# Patient Record
Sex: Male | Born: 1970
Health system: Southern US, Community
[De-identification: ages and names within clinical notes are randomized; demographics above are authoritative.]

## PROBLEM LIST (undated history)

## (undated) DIAGNOSIS — Z6841 Body Mass Index (BMI) 40.0 and over, adult: Secondary | ICD-10-CM

## (undated) DIAGNOSIS — R51 Headache: Secondary | ICD-10-CM

## (undated) DIAGNOSIS — K7682 Hepatic encephalopathy: Secondary | ICD-10-CM

## (undated) DIAGNOSIS — N200 Calculus of kidney: Secondary | ICD-10-CM

## (undated) DIAGNOSIS — G43909 Migraine, unspecified, not intractable, without status migrainosus: Secondary | ICD-10-CM

## (undated) DIAGNOSIS — I878 Other specified disorders of veins: Secondary | ICD-10-CM

## (undated) DIAGNOSIS — K7290 Hepatic failure, unspecified without coma: Secondary | ICD-10-CM

## (undated) DIAGNOSIS — R519 Headache, unspecified: Secondary | ICD-10-CM

## (undated) DIAGNOSIS — K219 Gastro-esophageal reflux disease without esophagitis: Secondary | ICD-10-CM

## (undated) DIAGNOSIS — G4733 Obstructive sleep apnea (adult) (pediatric): Secondary | ICD-10-CM

## (undated) DIAGNOSIS — Z9989 Dependence on other enabling machines and devices: Secondary | ICD-10-CM

## (undated) DIAGNOSIS — R0600 Dyspnea, unspecified: Secondary | ICD-10-CM

## (undated) DIAGNOSIS — R0683 Snoring: Secondary | ICD-10-CM

## (undated) DIAGNOSIS — I10 Essential (primary) hypertension: Secondary | ICD-10-CM

## (undated) DIAGNOSIS — E7800 Pure hypercholesterolemia, unspecified: Secondary | ICD-10-CM

## (undated) DIAGNOSIS — L0390 Cellulitis, unspecified: Secondary | ICD-10-CM

## (undated) DIAGNOSIS — R7303 Prediabetes: Secondary | ICD-10-CM

## (undated) DIAGNOSIS — K7460 Unspecified cirrhosis of liver: Secondary | ICD-10-CM

## (undated) HISTORY — DX: Other specified disorders of veins: I87.8

## (undated) HISTORY — PX: VASECTOMY: SHX75

## (undated) HISTORY — DX: Pure hypercholesterolemia, unspecified: E78.00

## (undated) HISTORY — DX: Snoring: R06.83

## (undated) HISTORY — DX: Body Mass Index (BMI) 40.0 and over, adult: Z684

## (undated) HISTORY — DX: Calculus of kidney: N20.0

## (undated) HISTORY — DX: Morbid (severe) obesity due to excess calories: E66.01

## (undated) HISTORY — DX: Cellulitis, unspecified: L03.90

---
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
## 2008-06-16 ENCOUNTER — Emergency Department (HOSPITAL_COMMUNITY): Admission: EM | Admit: 2008-06-16 | Discharge: 2008-06-16 | Payer: Self-pay | Admitting: Emergency Medicine

## 2011-08-30 LAB — CBC
HCT: 46.5
Hemoglobin: 16.1
MCHC: 34.6
RDW: 13.8

## 2011-08-30 LAB — BASIC METABOLIC PANEL
CO2: 24
Calcium: 9.2
Glucose, Bld: 108 — ABNORMAL HIGH
Potassium: 5.3 — ABNORMAL HIGH
Sodium: 138

## 2011-08-30 LAB — DIFFERENTIAL
Basophils Absolute: 0
Basophils Relative: 0
Eosinophils Relative: 2
Monocytes Absolute: 0.8

## 2011-08-30 LAB — POCT CARDIAC MARKERS
CKMB, poc: <1 — ABNORMAL LOW
Troponin i, poc: <0.05

## 2015-01-02 DIAGNOSIS — N200 Calculus of kidney: Secondary | ICD-10-CM

## 2015-01-02 HISTORY — DX: Calculus of kidney: N20.0

## 2015-01-09 ENCOUNTER — Observation Stay (HOSPITAL_COMMUNITY)
Admission: EM | Admit: 2015-01-09 | Discharge: 2015-01-11 | Disposition: A | Discharge status: Home / Self Care | Payer: BLUE CROSS/BLUE SHIELD | Attending: Internal Medicine | Admitting: Internal Medicine

## 2015-01-09 ENCOUNTER — Inpatient Hospital Stay (HOSPITAL_COMMUNITY): Payer: BLUE CROSS/BLUE SHIELD

## 2015-01-09 ENCOUNTER — Emergency Department (HOSPITAL_COMMUNITY): Payer: BLUE CROSS/BLUE SHIELD

## 2015-01-09 ENCOUNTER — Encounter (HOSPITAL_COMMUNITY): Payer: Self-pay | Admitting: Vascular Surgery

## 2015-01-09 ENCOUNTER — Emergency Department: Payer: Self-pay | Admitting: Emergency Medicine

## 2015-01-09 DIAGNOSIS — R7303 Prediabetes: Secondary | ICD-10-CM

## 2015-01-09 DIAGNOSIS — R4701 Aphasia: Secondary | ICD-10-CM

## 2015-01-09 DIAGNOSIS — G459 Transient cerebral ischemic attack, unspecified: Secondary | ICD-10-CM | POA: Diagnosis not present

## 2015-01-09 DIAGNOSIS — I10 Essential (primary) hypertension: Secondary | ICD-10-CM

## 2015-01-09 DIAGNOSIS — G450 Vertebro-basilar artery syndrome: Secondary | ICD-10-CM

## 2015-01-09 DIAGNOSIS — I639 Cerebral infarction, unspecified: Secondary | ICD-10-CM | POA: Insufficient documentation

## 2015-01-09 DIAGNOSIS — Z79899 Other long term (current) drug therapy: Secondary | ICD-10-CM | POA: Insufficient documentation

## 2015-01-09 DIAGNOSIS — R7309 Other abnormal glucose: Secondary | ICD-10-CM

## 2015-01-09 DIAGNOSIS — D696 Thrombocytopenia, unspecified: Secondary | ICD-10-CM | POA: Diagnosis not present

## 2015-01-09 DIAGNOSIS — E785 Hyperlipidemia, unspecified: Secondary | ICD-10-CM | POA: Diagnosis not present

## 2015-01-09 DIAGNOSIS — G9340 Encephalopathy, unspecified: Secondary | ICD-10-CM

## 2015-01-09 DIAGNOSIS — K219 Gastro-esophageal reflux disease without esophagitis: Secondary | ICD-10-CM | POA: Diagnosis not present

## 2015-01-09 DIAGNOSIS — R279 Unspecified lack of coordination: Secondary | ICD-10-CM

## 2015-01-09 HISTORY — DX: Essential (primary) hypertension: I10

## 2015-01-09 HISTORY — DX: Gastro-esophageal reflux disease without esophagitis: K21.9

## 2015-01-09 LAB — COMPREHENSIVE METABOLIC PANEL
ALK PHOS: 82 U/L (ref 39–117)
ALT: 32 U/L (ref 0–53)
ANION GAP: 10 (ref 7–16)
ANION GAP: 4 — AB (ref 5–15)
AST: 59 U/L — AB (ref 0–37)
Albumin: 2.4 g/dL — ABNORMAL LOW (ref 3.4–5.0)
Albumin: 2.7 g/dL — ABNORMAL LOW (ref 3.5–5.2)
Alkaline Phosphatase: 94 U/L (ref 46–116)
BUN: 15 mg/dL (ref 7–18)
BUN: 14 mg/dL (ref 6–23)
Bilirubin,Total: 0.8 mg/dL (ref 0.2–1.0)
CHLORIDE: 108 mmol/L (ref 96–112)
CO2: 24 mmol/L (ref 19–32)
CREATININE: 0.85 mg/dL (ref 0.50–1.35)
Calcium, Total: 8.1 mg/dL — ABNORMAL LOW (ref 8.5–10.1)
Calcium: 8.5 mg/dL (ref 8.4–10.5)
Chloride: 111 mmol/L — ABNORMAL HIGH (ref 98–107)
Co2: 22 mmol/L (ref 21–32)
Creatinine: 0.66 mg/dL (ref 0.60–1.30)
EGFR (African American): >60
EGFR (Non-African Amer.): >60
Glucose, Bld: 113 mg/dL — ABNORMAL HIGH (ref 70–99)
Glucose: 115 mg/dL — ABNORMAL HIGH (ref 65–99)
OSMOLALITY: 287 (ref 275–301)
POTASSIUM: 3.8 mmol/L (ref 3.5–5.1)
Potassium: 4.1 mmol/L (ref 3.5–5.1)
SGOT(AST): 58 U/L — ABNORMAL HIGH (ref 15–37)
SGPT (ALT): 38 U/L (ref 14–63)
SODIUM: 136 mmol/L (ref 135–145)
Sodium: 143 mmol/L (ref 136–145)
Total Bilirubin: 1.2 mg/dL (ref 0.3–1.2)
Total Protein: 6.4 g/dL (ref 6.4–8.2)
Total Protein: 6.5 g/dL (ref 6.0–8.3)

## 2015-01-09 LAB — CBC WITH DIFFERENTIAL/PLATELET
BASOS ABS: 0.1 10*3/uL (ref 0.0–0.1)
BASOS PCT: 2 % — AB (ref 0–1)
EOS ABS: 0.2 10*3/uL (ref 0.0–0.7)
Eosinophils Relative: 3 % (ref 0–5)
HEMATOCRIT: 39.6 % (ref 39.0–52.0)
HEMOGLOBIN: 14.2 g/dL (ref 13.0–17.0)
LYMPHS PCT: 25 % (ref 12–46)
Lymphs Abs: 1.3 10*3/uL (ref 0.7–4.0)
MCH: 33.6 pg (ref 26.0–34.0)
MCHC: 35.9 g/dL (ref 30.0–36.0)
MCV: 93.6 fL (ref 78.0–100.0)
MONOS PCT: 15 % — AB (ref 3–12)
Monocytes Absolute: 0.8 10*3/uL (ref 0.1–1.0)
NEUTROS ABS: 2.8 10*3/uL (ref 1.7–7.7)
Neutrophils Relative %: 55 % (ref 43–77)
Platelets: 109 10*3/uL — ABNORMAL LOW (ref 150–400)
RBC: 4.23 MIL/uL (ref 4.22–5.81)
RDW: 14.7 % (ref 11.5–15.5)
WBC: 5.2 10*3/uL (ref 4.0–10.5)

## 2015-01-09 LAB — URINALYSIS, ROUTINE W REFLEX MICROSCOPIC
GLUCOSE, UA: NEGATIVE mg/dL
HGB URINE DIPSTICK: NEGATIVE
Ketones, ur: NEGATIVE mg/dL
Leukocytes, UA: NEGATIVE
Nitrite: NEGATIVE
PH: 8 (ref 5.0–8.0)
PROTEIN: NEGATIVE mg/dL
SPECIFIC GRAVITY, URINE: 1.031 — AB (ref 1.005–1.030)
UROBILINOGEN UA: 1 mg/dL (ref 0.0–1.0)

## 2015-01-09 LAB — I-STAT VENOUS BLOOD GAS, ED
Bicarbonate: 22.8 mEq/L (ref 20.0–24.0)
O2 SAT: 72 %
TCO2: 24 mmol/L (ref 0–100)
pCO2, Ven: 32.1 mmHg — ABNORMAL LOW (ref 45.0–50.0)
pH, Ven: 7.46 — ABNORMAL HIGH (ref 7.250–7.300)
pO2, Ven: 35 mmHg (ref 30.0–45.0)

## 2015-01-09 LAB — CBC
HCT: 41.4 % (ref 40.0–52.0)
HGB: 14 g/dL (ref 13.0–18.0)
MCH: 33 pg (ref 26.0–34.0)
MCHC: 33.8 g/dL (ref 32.0–36.0)
MCV: 98 fL (ref 80–100)
PLATELETS: 86 10*3/uL — AB (ref 150–440)
RBC: 4.25 10*6/uL — ABNORMAL LOW (ref 4.40–5.90)
RDW: 14.8 % — AB (ref 11.5–14.5)
WBC: 4.5 10*3/uL (ref 3.8–10.6)

## 2015-01-09 LAB — RAPID URINE DRUG SCREEN, HOSP PERFORMED
AMPHETAMINES: NOT DETECTED
BARBITURATES: NOT DETECTED
Benzodiazepines: NOT DETECTED
COCAINE: NOT DETECTED
Opiates: NOT DETECTED
Tetrahydrocannabinol: NOT DETECTED

## 2015-01-09 LAB — I-STAT CHEM 8, ED
BUN: 17 mg/dL (ref 6–23)
CHLORIDE: 108 mmol/L (ref 96–112)
CREATININE: 0.7 mg/dL (ref 0.50–1.35)
Calcium, Ion: 1.08 mmol/L — ABNORMAL LOW (ref 1.12–1.23)
Glucose, Bld: 108 mg/dL — ABNORMAL HIGH (ref 70–99)
HEMATOCRIT: 43 % (ref 39.0–52.0)
Hemoglobin: 14.6 g/dL (ref 13.0–17.0)
POTASSIUM: 4.1 mmol/L (ref 3.5–5.1)
SODIUM: 141 mmol/L (ref 135–145)
TCO2: 20 mmol/L (ref 0–100)

## 2015-01-09 LAB — ETHANOL: Alcohol, Ethyl (B): <5 mg/dL (ref 0–9)

## 2015-01-09 LAB — I-STAT TROPONIN, ED: Troponin i, poc: 0 ng/mL (ref 0.00–0.08)

## 2015-01-09 LAB — PROTIME-INR
INR: 1.26 (ref 0.00–1.49)
Prothrombin Time: 15.9 s — ABNORMAL HIGH (ref 11.6–15.2)

## 2015-01-09 LAB — APTT: APTT: 32 s (ref 24–37)

## 2015-01-09 LAB — AMMONIA: AMMONIA: 169 umol/L — AB (ref 11–32)

## 2015-01-09 LAB — TROPONIN I

## 2015-01-09 MED ORDER — ENOXAPARIN SODIUM 40 MG/0.4ML ~~LOC~~ SOLN
40.0000 mg | SUBCUTANEOUS | Status: DC
  Administered 2015-01-09: 40 mg via SUBCUTANEOUS
  Filled 2015-01-09: qty 0.4

## 2015-01-09 MED ORDER — SENNOSIDES-DOCUSATE SODIUM 8.6-50 MG PO TABS: 1.0000 | ORAL_TABLET | Freq: Every evening | ORAL | Status: DC | PRN

## 2015-01-09 MED ORDER — LABETALOL HCL 5 MG/ML IV SOLN: 10.0000 mg | INTRAVENOUS | Status: DC | PRN

## 2015-01-09 MED ORDER — STROKE: EARLY STAGES OF RECOVERY BOOK
Freq: Once | Status: AC
  Administered 2015-01-09: 22:00:00

## 2015-01-09 MED ORDER — PANTOPRAZOLE SODIUM 40 MG PO TBEC
40.0000 mg | DELAYED_RELEASE_TABLET | Freq: Every day | ORAL | Status: DC
  Administered 2015-01-10 – 2015-01-11 (×2): 40 mg via ORAL
  Filled 2015-01-09 (×2): qty 1

## 2015-01-09 MED ORDER — ASPIRIN 300 MG RE SUPP: 300.0000 mg | Freq: Every day | RECTAL | Status: DC

## 2015-01-09 MED ORDER — ASPIRIN 325 MG PO TABS
325.0000 mg | ORAL_TABLET | Freq: Every day | ORAL | Status: DC
  Administered 2015-01-09: 325 mg via ORAL
  Filled 2015-01-09: qty 1

## 2015-01-09 NOTE — ED Notes (Signed)
Pt being transported out of the department for testing

## 2015-01-09 NOTE — H&P (Signed)
[facility] History and Physical  Ryan Burgess YTK:[id number] DOB: Sep 22, 1971 DOA: 01/09/2015  Referring physician:  PCP: Pcp Not In System   Chief Complaint: Slurred speech/confusion  HPI: Ryan Burgess is a 44 y.o. male with a past medical history of hypertension, borderline diabetes mellitus, gastroesophageal reflux disease, morbid obesity, presenting to the emergency room with complaints of slurred speech and confusion. Patient states going to work in [employer] as usual with her co-workers found him to have slurred speech, word finding difficulties, and confusion. He denies having these symptoms previously, does not have history of CVA nor does he currently take antiplatelet therapy. His daughter-in-law drove him back to the area where he was initially evaluated at [facility] then transferred to the emergency department at [facility] due to not fitting in their MRI scanner from his morbid obesity. He reports "not feeling right" today and continues to have word finding difficulties during my encounter with him. A CT scan of brain did not show acute intracranial abnormalities.                                                                                     Review of Systems:  Constitutional:  No weight loss, night sweats, Fevers, chills, fatigue.  HEENT:  No headaches, Difficulty swallowing,Tooth/dental problems,Sore throat,  No sneezing, itching, ear ache, nasal congestion, post nasal drip,  Cardio-vascular:  No chest pain, Orthopnea, PND, swelling in lower extremities, anasarca, dizziness, palpitations  GI:  No heartburn, indigestion, abdominal pain, nausea, vomiting, diarrhea, change in bowel habits, loss of appetite  Resp:  No shortness of breath with exertion or at rest. No excess mucus, no productive cough, No non-productive cough, No coughing up of blood.No change in color of mucus.No wheezing.No chest wall deformity  Skin:  no rash or  lesions.  GU:  no dysuria, change in color of urine, no urgency or frequency. No flank pain.  Musculoskeletal:  No joint pain or swelling. No decreased range of motion. No back pain.  Psych:  Positive for confusion  Past Medical History  Diagnosis Date  . GERD (gastroesophageal reflux disease)   . Obesity    History reviewed. No pertinent past surgical history. Social History:  reports that he has never smoked. He has never used smokeless tobacco. He reports that he does not drink alcohol or use illicit drugs.  Not on File  History reviewed. No pertinent family history.   Prior to Admission medications   Medication Sig Start Date End Date Taking? Authorizing Provider  Dexlansoprazole 30 MG capsule Take 30 mg by mouth daily.   Yes Historical Provider, MD  ibuprofen (ADVIL,MOTRIN) 200 MG tablet Take 400 mg by mouth every 6 (six) hours as needed for moderate pain.   Yes Historical Provider, MD  phenylephrine (SUDAFED PE) 10 MG TABS tablet Take 10 mg by mouth every 4 (four) hours as needed (for congestion).    Yes Historical Provider, MD   Physical Exam: Filed Vitals:   01/09/15 1810 01/09/15 1812 01/09/15 1830 01/09/15 2006  BP: 145/55  131/96 157/91  Pulse: 89  84 86  Temp:  98.4 F (36.9 C)  98.2  F (36.8 C)  TempSrc:    Oral  Resp: 14  17 12   SpO2: 100%  100% 97%    Wt Readings from Last 3 Encounters:  No data found for Wt    General:  Appears calm and comfortable, no acute distress, sitting at side of bed. He is awake, alert, following commands, does have some word finding difficulties. Eyes: PERRL, normal lids, irises & conjunctiva ENT: grossly normal hearing, lips & tongue Neck: no LAD, masses or thyromegaly Cardiovascular: RRR, no m/r/g. No LE edema. Telemetry: SR, no arrhythmias  Respiratory: CTA bilaterally, no w/r/r. Normal respiratory effort. Abdomen: soft, ntnd Skin: No rashes or lesions Musculoskeletal: grossly normal tone BUE/BLE, has bilateral pedal  edema Psychiatric: Patient is calm and cooperative however has word finding difficulties Neurologic: Patient is awake, alert, able to carry conversation however has word finding difficulties. I did not note slurred speech. There is no evidence of facial droop or tongue deviation. He had 5 out of 5 muscle strength to bilateral upper extremities and lower extremities without alteration to sensation. Deep tendon reflexes intact.           Labs on Admission:  Basic Metabolic Panel:  Recent Labs Lab 01/09/15 1816 01/09/15 1830  NA 136 141  K 4.1 4.1  CL 108 108  CO2 24  --   GLUCOSE 113* 108*  BUN 14 17  CREATININE 0.85 0.70  CALCIUM 8.5  --    Liver Function Tests:  Recent Labs Lab 01/09/15 1816  AST 59*  ALT 32  ALKPHOS 82  BILITOT 1.2  PROT 6.5  ALBUMIN 2.7*   No results for input(s): LIPASE, AMYLASE in the last 168 hours. No results for input(s): AMMONIA in the last 168 hours. CBC:  Recent Labs Lab 01/09/15 1830  HGB 14.6  HCT 43.0   Cardiac Enzymes: No results for input(s): CKTOTAL, CKMB, CKMBINDEX, TROPONINI in the last 168 hours.  BNP (last 3 results) No results for input(s): BNP in the last 8760 hours.  ProBNP (last 3 results) No results for input(s): PROBNP in the last 8760 hours.  CBG: No results for input(s): GLUCAP in the last 168 hours.  Radiological Exams on Admission: Ct Head Wo Contrast  01/09/2015   CLINICAL DATA:  Confusion.  Dizziness.  Initial encounter.  EXAM: CT HEAD WITHOUT CONTRAST  TECHNIQUE: Contiguous axial images were obtained from the base of the skull through the vertex without intravenous contrast.  COMPARISON:  None.  FINDINGS: No mass lesion, mass effect, midline shift, hydrocephalus, hemorrhage. No territorial ischemia or acute infarction. Scout images appear normal. Visible paranasal sinuses and mastoid air cells normal.  IMPRESSION: Negative CT head.   Electronically Signed   By: Dereck Ligas M.D.   On: 01/09/2015 19:03     EKG: Independently reviewed. Sinus rhythm.  Assessment/Plan Principal Problem:   Acute CVA (cerebrovascular accident) Active Problems:   Encephalopathy acute   HTN (hypertension)   Borderline diabetic   GERD (gastroesophageal reflux disease)   CVA (cerebral infarction)   1. Suspected acute CVA. Patient with multiple cardiovascular risk factors including morbid obesity, hypertension and borderline diabetes presenting with complaints of slurred speech, confusion, word finding difficulties. Initial CT scan of brain did not show acute intracranial abnormalities. In the emergency room he was seen and evaluated by neurology. Will admit patient to telemetry place him on stroke protocol, obtain MRI/MRA of brain, transthoracic echocardiogram, carotid Dopplers, fasting lipid panel, hemoglobin A1c. Patient had not been on antiplatelet therapy up to  this point, will start aspirin 325 mg by mouth daily. 2. Hypertension. Will allow permissive hypertension to favor cerebral perfusion in setting of acute CVA, provide when necessary labetalol for systolic blood pressures greater than 200 3. Acute encephalopathy. Suspect secondary to acute CVA. Awaiting urine drug screen and urinalysis. 4. History of borderline diabetes. Chemistry showing glucose of 108. Will check hemoglobin A1c 5. Gastroesophageal reflux disease. Provide PPI therapy with Protonix 6. DVT prophylaxis. Lovenox  Code Status: Full code Family Communication: I spoke to his son and daughter-in-law who were present at bedside Disposition Plan: Will admit patient to telemetry, anticipate he may require greater than 2 nights hospitalization  Time spent: 70 minutes  Kelvin Cellar [facility] Pager [phone]

## 2015-01-09 NOTE — ED Notes (Signed)
Pt reports to the ED for eval of generalized weakness and confusion. Pt alert but only oriented to person. Pt denies any pain. Pt NSR on the monitor. Pt denies any numbness or tingling. No facial droop or unilateral weakness. Pt was referred here for a CT scan of the head. Resp e/u. Skin warm and dry.

## 2015-01-09 NOTE — Progress Notes (Signed)
Patient arrived to 4N09 from ED. VSS. Patient oriented to room and unit. Will continue to monitor patient closely. Burnell Blanks, RN

## 2015-01-09 NOTE — ED Provider Notes (Signed)
CSN: [id number]     Arrival date & time 01/09/15  1713 History   First MD Initiated Contact with Patient 01/09/15 1715     Chief Complaint  Patient presents with  . Weakness     Patient is a 44 y.o. male presenting with weakness. The history is provided by the patient. No language interpreter was used.  Weakness   Mr. Chait presents for evaluation of not feeling right and feeling off. Level V caveat due to confusion.  Last seen normal was last night.  He denies any weakness, numbness, chest pain, fever, abdominal pain, vomiting, dysuria.  Sxs are moderate and constant.  He denies any medical problems. He was sent over from [facility] center for evaluation of possible stroke (they were unable to evaluate by CT due to patient size).    Past Medical History  Diagnosis Date  . GERD (gastroesophageal reflux disease)   . Obesity    History reviewed. No pertinent past surgical history. History reviewed. No pertinent family history. History  Substance Use Topics  . Smoking status: Never Smoker   . Smokeless tobacco: Never Used  . Alcohol Use: No    Review of Systems  Neurological: Positive for weakness.  All other systems reviewed and are negative.     Allergies  Review of patient's allergies indicates not on file.  Home Medications   Prior to Admission medications   Not on File   There were no vitals taken for this visit. Physical Exam  Constitutional: He is oriented to person, place, and time. He appears well-developed and well-nourished.  obese  HENT:  Head: Normocephalic and atraumatic.  Eyes: Pupils are equal, round, and reactive to light.  Cardiovascular: Normal rate and regular rhythm.   No murmur heard. Pulmonary/Chest: Effort normal and breath sounds normal. No respiratory distress.  Abdominal: Soft. There is no tenderness. There is no rebound and no guarding.  Musculoskeletal:  Lichenification of BLE, left greater than right  Neurological: He  is alert and oriented to person, place, and time. No cranial nerve deficit.  Confused, difficulty expressing himself.  MAE symmetrically. Slight asterixis in BUE  Skin: Skin is warm and dry.  Psychiatric: He has a normal mood and affect. His behavior is normal.  Nursing note and vitals reviewed.   ED Course  Procedures (including critical care time) Labs Review Labs Reviewed  PROTIME-INR - Abnormal; Notable for the following:    Prothrombin Time 15.9 (*)    All other components within normal limits  COMPREHENSIVE METABOLIC PANEL - Abnormal; Notable for the following:    Glucose, Bld 113 (*)    Albumin 2.7 (*)    AST 59 (*)    Anion gap 4 (*)    All other components within normal limits  URINALYSIS, ROUTINE W REFLEX MICROSCOPIC - Abnormal; Notable for the following:    Color, Urine AMBER (*)    Specific Gravity, Urine 1.031 (*)    Bilirubin Urine SMALL (*)    All other components within normal limits  CBC WITH DIFFERENTIAL/PLATELET - Abnormal; Notable for the following:    Platelets 109 (*)    Monocytes Relative 15 (*)    Basophils Relative 2 (*)    All other components within normal limits  AMMONIA - Abnormal; Notable for the following:    Ammonia 169 (*)    All other components within normal limits  I-STAT CHEM 8, ED - Abnormal; Notable for the following:    Glucose, Bld 108 (*)  Calcium, Ion 1.08 (*)    All other components within normal limits  I-STAT VENOUS BLOOD GAS, ED - Abnormal; Notable for the following:    pH, Ven 7.460 (*)    pCO2, Ven 32.1 (*)    All other components within normal limits  ETHANOL  APTT  URINE RAPID DRUG SCREEN (HOSP PERFORMED)  HEMOGLOBIN A1C  CBC  BASIC METABOLIC PANEL  LIPID PANEL  I-STAT TROPOININ, ED  Randolm Idol, ED    Imaging Review Dg Chest 2 View  01/09/2015   CLINICAL DATA:  Acute onset of generalized weakness and symptoms of CVA. Initial encounter.  EXAM: CHEST  2 VIEW  COMPARISON:  Chest radiograph performed  02/08/2011  FINDINGS: The lungs are well-aerated. Mild vascular congestion is noted. Minimal bibasilar atelectasis is seen. There is no evidence of pleural effusion or pneumothorax.  The heart is normal in size; the mediastinal contour is within normal limits. No acute osseous abnormalities are seen.  IMPRESSION: Mild vascular congestion noted; minimal bibasilar atelectasis seen.   Electronically Signed   By: Garald Balding M.D.   On: 01/09/2015 22:05   Ct Head Wo Contrast  01/09/2015   CLINICAL DATA:  Confusion.  Dizziness.  Initial encounter.  EXAM: CT HEAD WITHOUT CONTRAST  TECHNIQUE: Contiguous axial images were obtained from the base of the skull through the vertex without intravenous contrast.  COMPARISON:  None.  FINDINGS: No mass lesion, mass effect, midline shift, hydrocephalus, hemorrhage. No territorial ischemia or acute infarction. Scout images appear normal. Visible paranasal sinuses and mastoid air cells normal.  IMPRESSION: Negative CT head.   Electronically Signed   By: Dereck Ligas M.D.   On: 01/09/2015 19:03     EKG Interpretation   Date/Time:  Monday January 09 2015 18:03:53 EST Ventricular Rate:  89 PR Interval:  185 QRS Duration: 98 QT Interval:  392 QTC Calculation: 477 R Axis:   33 Text Interpretation:  Sinus rhythm Low voltage, precordial leads Confirmed  by Hazle Coca [phone]) on 01/09/2015 6:07:19 PM      MDM   Final diagnoses:  Acute encephalopathy    Patient here for evaluation of confusion and change in his mental status. There is an nonfocal neurologic exam. Question encephalopathy versus CVA. Patient is not a TPA candidate given an age score and duration of symptoms. Patient has been seen by neurology with recommendation for medicine admission for further evaluation of encephalopathy. Patient updated a finding of studies and need for admission for further evaluation.    Quintella Reichert, MD 01/10/15 [phone]

## 2015-01-09 NOTE — Consult Note (Signed)
Stroke Consult    Chief Complaint: confusion HPI: Ryan Burgess is an 44 y.o. male transfer from [facility] for concern of possible ischemic infarct. Patient able to give limited history, perseverates on "I just don't feel right". Per notes and [facility] ED patient was in normal state of health last night at 2200. Woke up and noted to have difficulty with speech, word finding difficulty and noted perseveration. ED at [facility] also notes question of left sided weakness. Due to morbid obesity he was sent to [facility] for further imaging as he would not fit in their scanner. No family present to give further information.  Date last known well: 01/08/2015 Time last known well: 2200 tPA Given: no, outside tPA window  No past medical history on file.  No past surgical history on file.  No family history on file. Social History:  has no tobacco, alcohol, and drug history on file.  Allergies: Allergies not on file   (Not in a hospital admission)  ROS: Out of a complete 14 system review, the patient complains of only the following symptoms, and all other reviewed systems are negative. +speech difficulty  Physical Examination: There were no vitals filed for this visit. Physical Exam  Constitutional: He appears well-developed and well-nourished.  Psych: Affect appropriate to situation Eyes: No scleral injection HENT: No OP obstrucion Head: Normocephalic.  Cardiovascular: Normal rate and regular rhythm.  Respiratory: Effort normal and breath sounds normal.  GI: Soft. Bowel sounds are normal. No distension. There is no tenderness.  Skin: marked LE edema and skin breakdown  Neurologic Examination: Mental Status: Alert, oriented to name only. Perseverates on answers. Mild expressive and receptive aphasia. Able to follow 1 step command but difficulty with multi-step command Cranial Nerves: II: unable to visualize fundi, visual fields grossly normal, pupils equal, round, reactive to light  III,IV, VI:  ptosis not present, extra-ocular motions intact bilaterally V,VII: smile symmetric, facial light touch sensation normal bilaterally VIII: hearing normal bilaterally IX,X: gag reflex present XI: trapezius strength/neck flexion strength normal bilaterally XII: tongue strength normal  Motor: Asterixis noted with extension of bilateral UE 5/5 strength bilateral UE proximal and distal Proximal LE 4/5, distal 5-/5 Tone and bulk:normal tone throughout; no atrophy noted Sensory: decreased LT and PP bilateral LE Deep Tendon Reflexes: 1+ and symmetric throughout with absent patellar and AJs Plantars: Right: downgoing   Left: downgoing Cerebellar: normal finger-to-nose, unable to test HTS Gait: deferred  Laboratory Studies:   Basic Metabolic Panel: No results for input(s): NA, K, CL, CO2, GLUCOSE, BUN, CREATININE, CALCIUM, MG, PHOS in the last 168 hours.  Liver Function Tests: No results for input(s): AST, ALT, ALKPHOS, BILITOT, PROT, ALBUMIN in the last 168 hours. No results for input(s): LIPASE, AMYLASE in the last 168 hours. No results for input(s): AMMONIA in the last 168 hours.  CBC: No results for input(s): WBC, NEUTROABS, HGB, HCT, MCV, PLT in the last 168 hours.  Cardiac Enzymes: No results for input(s): CKTOTAL, CKMB, CKMBINDEX, TROPONINI in the last 168 hours.  BNP: Invalid input(s): POCBNP  CBG: No results for input(s): GLUCAP in the last 168 hours.  Microbiology: No results found for this or any previous visit.  Coagulation Studies: No results for input(s): LABPROT, INR in the last 72 hours.  Urinalysis: No results for input(s): COLORURINE, LABSPEC, PHURINE, GLUCOSEU, HGBUR, BILIRUBINUR, KETONESUR, PROTEINUR, UROBILINOGEN, NITRITE, LEUKOCYTESUR in the last 168 hours.  Invalid input(s): APPERANCEUR  Lipid Panel:  No results found for: CHOL, TRIG, HDL, CHOLHDL, VLDL, LDLCALC  HgbA1C:  No results found for: HGBA1C  Urine Drug Screen:  No results found for: LABOPIA,  COCAINSCRNUR, LABBENZ, AMPHETMU, THCU, LABBARB  Alcohol Level: No results for input(s): ETH in the last 168 hours.  Other results:  Imaging: No results found.  Assessment: 44 y.o. male with history of morbid obesity presenting with acute onset of confusion. Exam pertinent for expressive/receptive aphasia, perseveration and asterixis of bilateral UE. No focal weakness noted on exam. Differential would include a distal MCA infarct vs possible metabolic etiology.   Plan: 1. HgbA1c, fasting lipid panel 2. MRI, MRA  of the brain without contrast 3. PT consult, OT consult, Speech consult 4. Echocardiogram 5. Carotid dopplers 6. Prophylactic therapy-start ASA 81mg  daily 7. Risk factor modification 8. Telemetry monitoring 9. Frequent neuro checks 10. NPO until RN stroke swallow screen 11. Check Ammonia, CMP, TSH, Venous blood gas   Jim Like, DO [facility] [phone]  If 7pm- 7am, please page neurology on call as listed in AMION. 01/09/2015, 5:24 PM

## 2015-01-09 NOTE — ED Notes (Signed)
The patient is unable to give an urine specimen at this time. The tech has reported to the RN in charge. 

## 2015-01-09 NOTE — ED Notes (Signed)
Attempted report 

## 2015-01-09 NOTE — ED Notes (Signed)
Phlebotomy at bedside.

## 2015-01-10 ENCOUNTER — Inpatient Hospital Stay (HOSPITAL_COMMUNITY): Payer: BLUE CROSS/BLUE SHIELD

## 2015-01-10 ENCOUNTER — Encounter (HOSPITAL_COMMUNITY): Payer: Self-pay

## 2015-01-10 DIAGNOSIS — G459 Transient cerebral ischemic attack, unspecified: Secondary | ICD-10-CM | POA: Diagnosis present

## 2015-01-10 DIAGNOSIS — I6789 Other cerebrovascular disease: Secondary | ICD-10-CM

## 2015-01-10 LAB — BASIC METABOLIC PANEL
Anion gap: 8 (ref 5–15)
BUN: 17 mg/dL (ref 6–23)
CO2: 18 mmol/L — AB (ref 19–32)
Calcium: 8.3 mg/dL — ABNORMAL LOW (ref 8.4–10.5)
Chloride: 110 mmol/L (ref 96–112)
Creatinine, Ser: 0.83 mg/dL (ref 0.50–1.35)
GFR calc Af Amer: >90 mL/min (ref >90)
GFR calc non Af Amer: >90 mL/min (ref >90)
Glucose, Bld: 98 mg/dL (ref 70–99)
Potassium: 3.7 mmol/L (ref 3.5–5.1)
Sodium: 136 mmol/L (ref 135–145)

## 2015-01-10 LAB — CBC
HEMATOCRIT: 37 % — AB (ref 39.0–52.0)
Hemoglobin: 12.8 g/dL — ABNORMAL LOW (ref 13.0–17.0)
MCH: 32.9 pg (ref 26.0–34.0)
MCHC: 34.6 g/dL (ref 30.0–36.0)
MCV: 95.1 fL (ref 78.0–100.0)
PLATELETS: 94 10*3/uL — AB (ref 150–400)
RBC: 3.89 MIL/uL — AB (ref 4.22–5.81)
RDW: 15 % (ref 11.5–15.5)
WBC: 4.2 10*3/uL (ref 4.0–10.5)

## 2015-01-10 LAB — LIPID PANEL
Cholesterol: 172 mg/dL (ref 0–200)
HDL: 35 mg/dL — ABNORMAL LOW (ref >39)
LDL Cholesterol: 125 mg/dL — ABNORMAL HIGH (ref 0–99)
Total CHOL/HDL Ratio: 4.9 RATIO
Triglycerides: 58 mg/dL (ref <150)
VLDL: 12 mg/dL (ref 0–40)

## 2015-01-10 MED ORDER — PERFLUTREN LIPID MICROSPHERE
1.0000 mL | INTRAVENOUS | Status: AC | PRN
  Administered 2015-01-10: 6 mL via INTRAVENOUS
  Filled 2015-01-10: qty 10

## 2015-01-10 MED ORDER — ASPIRIN 81 MG PO TBEC: 81.0000 mg | DELAYED_RELEASE_TABLET | Freq: Every day | ORAL | Status: AC

## 2015-01-10 MED ORDER — ATORVASTATIN CALCIUM 20 MG PO TABS: 20.0000 mg | ORAL_TABLET | Freq: Every day | ORAL | Status: DC

## 2015-01-10 MED ORDER — PERFLUTREN LIPID MICROSPHERE
INTRAVENOUS | Status: AC
  Filled 2015-01-10: qty 10

## 2015-01-10 MED ORDER — FUROSEMIDE 20 MG PO TABS
20.0000 mg | ORAL_TABLET | Freq: Every day | ORAL | Status: DC
  Administered 2015-01-10 – 2015-01-11 (×2): 20 mg via ORAL
  Filled 2015-01-10 (×2): qty 1

## 2015-01-10 MED ORDER — ASPIRIN EC 81 MG PO TBEC
81.0000 mg | DELAYED_RELEASE_TABLET | Freq: Every day | ORAL | Status: DC
  Administered 2015-01-10 – 2015-01-11 (×2): 81 mg via ORAL
  Filled 2015-01-10 (×2): qty 1

## 2015-01-10 MED ORDER — ATORVASTATIN CALCIUM 10 MG PO TABS
20.0000 mg | ORAL_TABLET | Freq: Every day | ORAL | Status: DC
  Administered 2015-01-10: 20 mg via ORAL
  Filled 2015-01-10: qty 2

## 2015-01-10 MED ORDER — IOHEXOL 350 MG/ML SOLN
100.0000 mL | Freq: Once | INTRAVENOUS | Status: AC | PRN
  Administered 2015-01-10: 100 mL via INTRAVENOUS

## 2015-01-10 MED ORDER — FUROSEMIDE 20 MG PO TABS: 20.0000 mg | ORAL_TABLET | Freq: Every day | ORAL | Status: DC

## 2015-01-10 NOTE — Discharge Summary (Signed)
Physician Discharge Summary  Patient ID: Ryan Burgess MRN: [id number] DOB/AGE: 1971/03/22 44 y.o.  Admit date: 01/09/2015 Discharge date: 01/11/2015  Primary Care Physician:  Donnie Coffin, MD  Discharge Diagnoses:    . Encephalopathy acute . TIA  . HTN (hypertension) . Borderline diabetes  morbid obesity Peripheral edema Hyperlipidemia Thrombocytopenia  Consults:  Neurology, Dr. Leonie Man   Recommendations for Outpatient Follow-up:  Patient was recommended to follow-up with PCP and obtain labs for BMET, CBC in 7 days  Please follow platelet count, patient may need referral to hematology clinic if trending down.    TESTS THAT NEED FOLLOW-UP BMET, CBC   DIET: Heart healthy diet    Allergies:  No Known Allergies   Discharge Medications:   Medication List    TAKE these medications        aspirin 81 MG EC tablet  Take 1 tablet (81 mg total) by mouth daily.     atorvastatin 20 MG tablet  Commonly known as:  LIPITOR  Take 1 tablet (20 mg total) by mouth at bedtime.     blood glucose meter kit and supplies Kit  Dispense based on patient and insurance preference. Use up to four times daily as directed. (FOR ICD-9 250.00, 250.01).     Dexlansoprazole 30 MG capsule  Take 30 mg by mouth daily.     furosemide 20 MG tablet  Commonly known as:  LASIX  Take 1 tablet (20 mg total) by mouth daily.     ibuprofen 200 MG tablet  Commonly known as:  ADVIL,MOTRIN  Take 400 mg by mouth every 6 (six) hours as needed for moderate pain.     phenylephrine 10 MG Tabs tablet  Commonly known as:  SUDAFED PE  Take 10 mg by mouth every 4 (four) hours as needed (for congestion).         Brief H and P: For complete details please refer to admission H and P, but in brief Ryan Burgess is a 44 y.o. male with a past medical history of hypertension, borderline diabetes mellitus, gastroesophageal reflux disease, morbid obesity, presenting to the emergency room with complaints  of slurred speech and confusion. Patient reported going to work in [place] as usual and his co-workers found him to have slurred speech, word finding difficulties, and confusion. He denied having these symptoms previously, does not have history of CVA nor does he currently take antiplatelet therapy. His wife drove him back to the area where he was initially evaluated at [facility] then transferred to the emergency department at [facility] due to not fitting in their MRI scanner from his morbid obesity. He reports "not feeling right" today and continues to have word finding difficulties.  CT scan of brain did not show acute intracranial abnormalities.  Hospital Course:     Encephalopathy acute vs TIA - Completely resolved. Patient was admitted for stroke workup. MRI of the brain showed no acute infarct or hemorrhage. MRA showed no medium or large size vessel significant stenosis or occlusion. 2-D echo was obtained however was extremely limited study and did not reflect on EF or valves or PFO. This was discussed in detail with Dr. Leonie Man, from stroke service who did not recommend any TEE or cardiac MRI given that patient had no acute infarct on the MRI imaging and it is not convincing that patient had absolute TIA symptoms are not. Dr. Leonie Man however did recommend CT angiogram of the head and neck. CT angiogram of the head  and neck was negative for any occlusion or stenosis. Lipid panel showed LDL 125, patient was not on any antiplatelet therapy prior to admission. Patient was started on aspirin 81 mg daily. He was started on Lipitor daily. Patient was strongly counseled for diet and weight control. Ambulatory referral were sent to sleep clinic and neurology outpatient.    Borderline diabetic - Hemoglobin A1c 6.1, patient was strongly recommended carb modified diet and weight control. Nutrition consult and diabetic coordinator consult was obtained. Patient was given a  prescription for the glucometer    GERD (gastroesophageal reflux disease) Continue PPI    Morbid obesity: BMI 56.8, weight 478 lbs Nutrition consult was obtained. Patient was also consulted strongly on exercise and weight control due to significant lymphedema and pedal edema, he was started on Lasix 20 mg daily. Patient will have his BMET checked outpatient in 4 days.  Thrombocytopenia: Unclear etiology Lovenox was discontinued, platelets 109 K at the time of admission. MRI of the brain incidentally tensioned it decreased to signal intensity of bone marrow may be related to patient's habitus. He may benefit from a hematology referral outpatient, will defer to PCP.  Day of Discharge BP 150/66 mmHg  Pulse 72  Temp(Src) 98 F (36.7 C) (Oral)  Resp 18  Ht _0  (1.956 m)  Wt 217 kg (478 lb 6.4 oz)  BMI 56.72 kg/m2  SpO2 100%  Physical Exam: General: Alert and awake oriented x3 not in any acute distress. HEENT: anicteric sclera, pupils reactive to light and accommodation CVS: S1-S2 clear no murmur rubs or gallops Chest: clear to auscultation bilaterally, no wheezing rales or rhonchi Abdomen:  morbid obesity, soft nontender, nondistended, normal bowel sounds Extremities: no cyanosis, clubbing, significant peripheral lymphedema/edema noted bilaterally Neuro: Cranial nerves II-XII intact, no focal neurological deficits   The results of significant diagnostics from this hospitalization (including imaging, microbiology, ancillary and laboratory) are listed below for reference.    LAB RESULTS: Basic Metabolic Panel:  Recent Labs Lab 01/10/15 0645 01/11/15 0935  NA 136 136  K 3.7 3.7  CL 110 108  CO2 18* 24  GLUCOSE 98 112*  BUN 17 12  CREATININE 0.83 0.88  CALCIUM 8.3* 8.1*   Liver Function Tests:  Recent Labs Lab 01/09/15 1816  AST 59*  ALT 32  ALKPHOS 82  BILITOT 1.2  PROT 6.5  ALBUMIN 2.7*   No results for input(s): LIPASE, AMYLASE in the last 168  hours.  Recent Labs Lab 01/09/15 1946  AMMONIA 169*   CBC:  Recent Labs Lab 01/09/15 1946 01/10/15 0645  WBC 5.2 4.2  NEUTROABS 2.8  --   HGB 14.2 12.8*  HCT 39.6 37.0*  MCV 93.6 95.1  PLT 109* 94*   Cardiac Enzymes: No results for input(s): CKTOTAL, CKMB, CKMBINDEX, TROPONINI in the last 168 hours. BNP: Invalid input(s): POCBNP CBG: No results for input(s): GLUCAP in the last 168 hours.  Significant Diagnostic Studies:  Dg Chest 2 View  01/09/2015   CLINICAL DATA:  Acute onset of generalized weakness and symptoms of CVA. Initial encounter.  EXAM: CHEST  2 VIEW  COMPARISON:  Chest radiograph performed 02/08/2011  FINDINGS: The lungs are well-aerated. Mild vascular congestion is noted. Minimal bibasilar atelectasis is seen. There is no evidence of pleural effusion or pneumothorax.  The heart is normal in size; the mediastinal contour is within normal limits. No acute osseous abnormalities are seen.  IMPRESSION: Mild vascular congestion noted; minimal bibasilar atelectasis seen.   Electronically Signed   By:  Garald Balding M.D.   On: 01/09/2015 22:05   Ct Head Wo Contrast  01/09/2015   CLINICAL DATA:  Confusion.  Dizziness.  Initial encounter.  EXAM: CT HEAD WITHOUT CONTRAST  TECHNIQUE: Contiguous axial images were obtained from the base of the skull through the vertex without intravenous contrast.  COMPARISON:  None.  FINDINGS: No mass lesion, mass effect, midline shift, hydrocephalus, hemorrhage. No territorial ischemia or acute infarction. Scout images appear normal. Visible paranasal sinuses and mastoid air cells normal.  IMPRESSION: Negative CT head.   Electronically Signed   By: Dereck Ligas M.D.   On: 01/09/2015 19:03   Mr Brain Wo Contrast  01/10/2015   CLINICAL DATA:  44 year old hypertensive, obese, borderline diabetic male presenting with episode of slurred speech and difficulty finding words with confusion. Subsequent encounter.  EXAM: MRI HEAD WITHOUT CONTRAST  MRA HEAD  WITHOUT CONTRAST  TECHNIQUE: Multiplanar, multiecho pulse sequences of the brain and surrounding structures were obtained without intravenous contrast. Angiographic images of the head were obtained using MRA technique without contrast.  COMPARISON:  01/09/2015 head CT.  No comparison brain MR.  FINDINGS: MRI HEAD FINDINGS  Some of the sequences are motion degraded.  No acute infarct.  No intracranial hemorrhage.  No hydrocephalus.  No intracranial mass lesion noted on this unenhanced exam.  Decreased signal intensity of bone marrow may be related to patient's habitus. Correlation with CBC to exclude anemia contributing to this appearance may be considered.  Cerebellar tonsils minimally low lying but within range of normal limits. Pituitary region, pineal region and orbital structures unremarkable.  Minimal to mild paranasal sinus mucosal thickening.  Major intracranial vascular structures are patent.  MRA HEAD FINDINGS  Anterior circulation without medium or large size vessel significant stenosis or occlusion.  Minimal irregularity of the left internal carotid artery proximal to the petrous segment may be related to artifact versus minimal narrowing.  Fetal type contribution to the posterior cerebral arteries bilaterally.  Ectatic vertebral arteries and basilar artery without high-grade stenosis.  Poor delineation of the left anterior inferior cerebellar artery.  Narrowing of the P1 segment of the left posterior cerebral artery. This is proximal to the fetal type contribution to the left posterior cerebral artery.  Slight irregularity branch vessels.  No aneurysm noted.  IMPRESSION: MRI HEAD  Some of the sequences are motion degraded.  No acute infarct.  No intracranial hemorrhage.  No intracranial mass lesion noted on this unenhanced exam.  Decreased signal intensity of bone marrow may be related to patient's habitus. Correlation with CBC to exclude anemia contributing to this appearance may be considered.  Minimal  to mild paranasal sinus mucosal thickening.  MRA HEAD FINDINGS  No medium or large size vessel significant stenosis or occlusion.  Please see above.   Electronically Signed   By: Genia Del M.D.   On: 01/10/2015 07:42   Mr Jodene Nam Head/brain Wo Cm  01/10/2015   CLINICAL DATA:  44 year old hypertensive, obese, borderline diabetic male presenting with episode of slurred speech and difficulty finding words with confusion. Subsequent encounter.  EXAM: MRI HEAD WITHOUT CONTRAST  MRA HEAD WITHOUT CONTRAST  TECHNIQUE: Multiplanar, multiecho pulse sequences of the brain and surrounding structures were obtained without intravenous contrast. Angiographic images of the head were obtained using MRA technique without contrast.  COMPARISON:  01/09/2015 head CT.  No comparison brain MR.  FINDINGS: MRI HEAD FINDINGS  Some of the sequences are motion degraded.  No acute infarct.  No intracranial hemorrhage.  No hydrocephalus.  No intracranial mass lesion noted on this unenhanced exam.  Decreased signal intensity of bone marrow may be related to patient's habitus. Correlation with CBC to exclude anemia contributing to this appearance may be considered.  Cerebellar tonsils minimally low lying but within range of normal limits. Pituitary region, pineal region and orbital structures unremarkable.  Minimal to mild paranasal sinus mucosal thickening.  Major intracranial vascular structures are patent.  MRA HEAD FINDINGS  Anterior circulation without medium or large size vessel significant stenosis or occlusion.  Minimal irregularity of the left internal carotid artery proximal to the petrous segment may be related to artifact versus minimal narrowing.  Fetal type contribution to the posterior cerebral arteries bilaterally.  Ectatic vertebral arteries and basilar artery without high-grade stenosis.  Poor delineation of the left anterior inferior cerebellar artery.  Narrowing of the P1 segment of the left posterior cerebral artery. This is  proximal to the fetal type contribution to the left posterior cerebral artery.  Slight irregularity branch vessels.  No aneurysm noted.  IMPRESSION: MRI HEAD  Some of the sequences are motion degraded.  No acute infarct.  No intracranial hemorrhage.  No intracranial mass lesion noted on this unenhanced exam.  Decreased signal intensity of bone marrow may be related to patient's habitus. Correlation with CBC to exclude anemia contributing to this appearance may be considered.  Minimal to mild paranasal sinus mucosal thickening.  MRA HEAD FINDINGS  No medium or large size vessel significant stenosis or occlusion.  Please see above.   Electronically Signed   By: Genia Del M.D.   On: 01/10/2015 07:42    2D ECHO: Study Conclusions  - Impressions: The images are extremely limited. Nothing can be seen on the standard images. I did approve proceeding with Definity Contrast. Vague images are seen with definity. The impression is given that the LV may not be dlated. There may be adequate LV motion. TEE or MUGA or cardiac MR would be needed to assess LV function further.   Disposition and Follow-up: Discharge Instructions    Ambulatory referral to Neurology    Complete by:  As directed   Stroke patient. Dr. Leonie Man prefers follow up in 1 month     Ambulatory referral to Sleep Studies    Complete by:  As directed   Patient with stroke. Has witnessed sleep apnea in the hospital. Consider for sleep study     Diet Carb Modified    Complete by:  As directed      Discharge instructions    Complete by:  As directed   Please check Blood sugars daily am before breakfast before follow-up with your PCP and bring the log to follow-up appointment. Check HbA1C every 3 months. Start healthy, low carb diet.     Increase activity slowly    Complete by:  As directed             DISPOSITION: Home   DISCHARGE FOLLOW-UP Follow-up Information    Follow up with SETHI,PRAMOD, MD. Schedule an  appointment as soon as possible for a visit in 1 month.   Specialties:  Neurology, Radiology   Why:  for hospital follow-up   Contact information:   [address] [place] [address] [phone]       Follow up with Donnie Coffin, MD. Schedule an appointment as soon as possible for a visit in 10 days.   Specialty:  Family Medicine   Why:  for hospital follow-up   Contact information:   [address] [place] [address] [phone]        Time spent on Discharge: 40 mins  Signed:   Ulanda Burgess M.D. [facility] 01/11/2015, 10:51 AM Pager: [phone]

## 2015-01-10 NOTE — Progress Notes (Signed)
STROKE TEAM PROGRESS NOTE   HISTORY Ryan Burgess is an 44 y.o. male transfer from [facility] for concern of possible ischemic infarct. Patient able to give limited history, perseverates on "I just don't feel right". Per notes and [facility] ED patient was in normal state of health last night at 2200 (last known well to 06/21/2015 at 2200). Woke up and noted to have difficulty with speech, word finding difficulty and noted perseveration. ED at [facility] also notes question of left sided weakness. Due to morbid obesity he was sent to [facility] for further imaging as he would not fit in their scanner. No family present to give further information. Patient was not administered TPA secondary to delay in arrival. He was admitted for further evaluation and treatment.   SUBJECTIVE (INTERVAL HISTORY) His wife is at the bedside.  Overall he feels his condition is stable.    OBJECTIVE Temp:  [97.3 F (36.3 C)-99.7 F (37.6 C)] 99.7 F (37.6 C) (02/09 1013) Pulse Rate:  [84-94] 87 (02/09 1013) Cardiac Rhythm:  [-] Normal sinus rhythm (02/08 2121) Resp:  [12-25] 20 (02/09 0701) BP: (102-175)/(35-96) 143/49 mmHg (02/09 1013) SpO2:  [94 %-100 %] 99 % (02/09 1013) Weight:  [217 kg (478 lb 6.4 oz)] 217 kg (478 lb 6.4 oz) (02/08 2100)  No results for input(s): GLUCAP in the last 168 hours.  Recent Labs Lab 01/09/15 1816 01/09/15 1830 01/10/15 0645  NA 136 141 136  K 4.1 4.1 3.7  CL 108 108 110  CO2 24  --  18*  GLUCOSE 113* 108* 98  BUN 14 17 17   CREATININE 0.85 0.70 0.83  CALCIUM 8.5  --  8.3*    Recent Labs Lab 01/09/15 1816  AST 59*  ALT 32  ALKPHOS 82  BILITOT 1.2  PROT 6.5  ALBUMIN 2.7*    Recent Labs Lab 01/09/15 1830 01/09/15 1946 01/10/15 0645  WBC  --  5.2 4.2  NEUTROABS  --  2.8  --   HGB 14.6 14.2 12.8*  HCT 43.0 39.6 37.0*  MCV  --  93.6 95.1  PLT  --  109* 94*   No results for input(s): CKTOTAL, CKMB, CKMBINDEX, TROPONINI in the last 168 hours.  Recent Labs  01/09/15 1816   LABPROT 15.9*  INR 1.26    Recent Labs  01/09/15 2034  COLORURINE AMBER*  LABSPEC 1.031*  PHURINE 8.0  GLUCOSEU NEGATIVE  HGBUR NEGATIVE  BILIRUBINUR SMALL*  KETONESUR NEGATIVE  PROTEINUR NEGATIVE  UROBILINOGEN 1.0  NITRITE NEGATIVE  LEUKOCYTESUR NEGATIVE       Component Value Date/Time   CHOL 172 01/10/2015 0645   TRIG 58 01/10/2015 0645   HDL 35* 01/10/2015 0645   CHOLHDL 4.9 01/10/2015 0645   VLDL 12 01/10/2015 0645   LDLCALC 125* 01/10/2015 0645   No results found for: HGBA1C    Component Value Date/Time   LABOPIA NONE DETECTED 01/09/2015 2034   COCAINSCRNUR NONE DETECTED 01/09/2015 2034   LABBENZ NONE DETECTED 01/09/2015 2034   AMPHETMU NONE DETECTED 01/09/2015 2034   THCU NONE DETECTED 01/09/2015 2034   LABBARB NONE DETECTED 01/09/2015 2034     Recent Labs Lab 01/09/15 1816  ETH <5    Dg Chest 2 View  01/09/2015   CLINICAL DATA:  Acute onset of generalized weakness and symptoms of CVA. Initial encounter.  EXAM: CHEST  2 VIEW  COMPARISON:  Chest radiograph performed 02/08/2011  FINDINGS: The lungs are well-aerated. Mild vascular congestion is noted. Minimal bibasilar atelectasis is seen. There  is no evidence of pleural effusion or pneumothorax.  The heart is normal in size; the mediastinal contour is within normal limits. No acute osseous abnormalities are seen.  IMPRESSION: Mild vascular congestion noted; minimal bibasilar atelectasis seen.   Electronically Signed   By: Garald Balding M.D.   On: 01/09/2015 22:05   Ct Head Wo Contrast  01/09/2015   CLINICAL DATA:  Confusion.  Dizziness.  Initial encounter.  EXAM: CT HEAD WITHOUT CONTRAST  TECHNIQUE: Contiguous axial images were obtained from the base of the skull through the vertex without intravenous contrast.  COMPARISON:  None.  FINDINGS: No mass lesion, mass effect, midline shift, hydrocephalus, hemorrhage. No territorial ischemia or acute infarction. Scout images appear normal. Visible paranasal sinuses and  mastoid air cells normal.  IMPRESSION: Negative CT head.   Electronically Signed   By: Dereck Ligas M.D.   On: 01/09/2015 19:03   Mr Brain Wo Contrast  01/10/2015   CLINICAL DATA:  44 year old hypertensive, obese, borderline diabetic male presenting with episode of slurred speech and difficulty finding words with confusion. Subsequent encounter.  EXAM: MRI HEAD WITHOUT CONTRAST  MRA HEAD WITHOUT CONTRAST  TECHNIQUE: Multiplanar, multiecho pulse sequences of the brain and surrounding structures were obtained without intravenous contrast. Angiographic images of the head were obtained using MRA technique without contrast.  COMPARISON:  01/09/2015 head CT.  No comparison brain MR.  FINDINGS: MRI HEAD FINDINGS  Some of the sequences are motion degraded.  No acute infarct.  No intracranial hemorrhage.  No hydrocephalus.  No intracranial mass lesion noted on this unenhanced exam.  Decreased signal intensity of bone marrow may be related to patient's habitus. Correlation with CBC to exclude anemia contributing to this appearance may be considered.  Cerebellar tonsils minimally low lying but within range of normal limits. Pituitary region, pineal region and orbital structures unremarkable.  Minimal to mild paranasal sinus mucosal thickening.  Major intracranial vascular structures are patent.  MRA HEAD FINDINGS  Anterior circulation without medium or large size vessel significant stenosis or occlusion.  Minimal irregularity of the left internal carotid artery proximal to the petrous segment may be related to artifact versus minimal narrowing.  Fetal type contribution to the posterior cerebral arteries bilaterally.  Ectatic vertebral arteries and basilar artery without high-grade stenosis.  Poor delineation of the left anterior inferior cerebellar artery.  Narrowing of the P1 segment of the left posterior cerebral artery. This is proximal to the fetal type contribution to the left posterior cerebral artery.  Slight  irregularity branch vessels.  No aneurysm noted.  IMPRESSION: MRI HEAD  Some of the sequences are motion degraded.  No acute infarct.  No intracranial hemorrhage.  No intracranial mass lesion noted on this unenhanced exam.  Decreased signal intensity of bone marrow may be related to patient's habitus. Correlation with CBC to exclude anemia contributing to this appearance may be considered.  Minimal to mild paranasal sinus mucosal thickening.  MRA HEAD FINDINGS  No medium or large size vessel significant stenosis or occlusion.  Please see above.   Electronically Signed   By: Genia Del M.D.   On: 01/10/2015 07:42   Mr Jodene Nam Head/brain Wo Cm  01/10/2015   CLINICAL DATA:  44 year old hypertensive, obese, borderline diabetic male presenting with episode of slurred speech and difficulty finding words with confusion. Subsequent encounter.  EXAM: MRI HEAD WITHOUT CONTRAST  MRA HEAD WITHOUT CONTRAST  TECHNIQUE: Multiplanar, multiecho pulse sequences of the brain and surrounding structures were obtained without intravenous contrast. Angiographic images  of the head were obtained using MRA technique without contrast.  COMPARISON:  01/09/2015 head CT.  No comparison brain MR.  FINDINGS: MRI HEAD FINDINGS  Some of the sequences are motion degraded.  No acute infarct.  No intracranial hemorrhage.  No hydrocephalus.  No intracranial mass lesion noted on this unenhanced exam.  Decreased signal intensity of bone marrow may be related to patient's habitus. Correlation with CBC to exclude anemia contributing to this appearance may be considered.  Cerebellar tonsils minimally low lying but within range of normal limits. Pituitary region, pineal region and orbital structures unremarkable.  Minimal to mild paranasal sinus mucosal thickening.  Major intracranial vascular structures are patent.  MRA HEAD FINDINGS  Anterior circulation without medium or large size vessel significant stenosis or occlusion.  Minimal irregularity of the  left internal carotid artery proximal to the petrous segment may be related to artifact versus minimal narrowing.  Fetal type contribution to the posterior cerebral arteries bilaterally.  Ectatic vertebral arteries and basilar artery without high-grade stenosis.  Poor delineation of the left anterior inferior cerebellar artery.  Narrowing of the P1 segment of the left posterior cerebral artery. This is proximal to the fetal type contribution to the left posterior cerebral artery.  Slight irregularity branch vessels.  No aneurysm noted.  IMPRESSION: MRI HEAD  Some of the sequences are motion degraded.  No acute infarct.  No intracranial hemorrhage.  No intracranial mass lesion noted on this unenhanced exam.  Decreased signal intensity of bone marrow may be related to patient's habitus. Correlation with CBC to exclude anemia contributing to this appearance may be considered.  Minimal to mild paranasal sinus mucosal thickening.  MRA HEAD FINDINGS  No medium or large size vessel significant stenosis or occlusion.  Please see above.   Electronically Signed   By: Genia Del M.D.   On: 01/10/2015 07:42   2D Echocardiogram  The images are extremely limited. Nothing can beseen on the standard images. I did approve proceeding withDefinity Contrast. Vague images are seen with definity. Theimpression is given that the LV may not be dlated. There may beadequate LV motion. TEE or MUGA or cardiac MR would be needed toassess LV function further.   PHYSICAL EXAM Morbidly obese middle-age Caucasian male currently not in distress. . Afebrile. Head is nontraumatic. Neck is supple without bruit.    Cardiac exam no murmur or gallop. Lungs are clear to auscultation. Distal pulses are well felt. Neurological Exam ;  Awake  Alert oriented x 3. Normal speech and language.eye movements full without nystagmus.fundi were not visualized. Vision acuity and fields appear normal. Hearing is normal. Palatal movements are normal. Face  symmetric. Tongue midline. Normal strength, tone, reflexes and coordination. Normal sensation. Gait deferred. ASSESSMENT/PLAN Ryan Burgess is a 44 y.o. male with history of hypertension, prediabetes, gastroesophageal reflux disease and morbid obesity presenting with slurred speech and confusion. He did not receive IV t-PA due to delay in arrival.   Posterior circulation TIA  Resultant  Neurologic deficits resolved  MRI  No acute stroke  MRA  Unremarkable   Carotid Doppler  Cancelled. We will check CT angiogram head and neck.  2D Echo  Inadequate. No need for MUGA or TEE from Dr. Clydene Fake standpoint  HgbA1c pending  SCDs for VTE prophylaxis  Diet Heart thin liquids  no antithrombotic prior to admission, now on aspirin 81 mg orally every day  Patient counseled to be compliant with his antithrombotic medications  Ongoing aggressive stroke risk factor management  Therapy recommendations:  No OT  Disposition:  Discharge home  Okay for discharge from stroke standpoint once workup is completed  Follow-up with Dr. Leonie Man in 1 month. (Order written)  Hypertension  Stable  Hyperlipidemia  Home meds:  No statin  LDL 125, goal < 70  New lipitor 20 mg this admission  Continue statin at discharge  Pre-Diabetes  HgbA1c pending, goal < 7.0  Other Stroke Risk Factors  Morbid Obesity, Body mass index is 56.72 kg/(m^2).   Obstructive sleep apnea per wife, has not had formal testing. I had mad arrangements for OP testing with [facility].  Other Active Problems  GERD. On coated aspirin.  Hospital day # [place] for Pager information 01/10/2015 12:27 PM  I have personally examined this patient, reviewed notes, independently viewed imaging studies, participated in medical decision making and plan of care. I have made any additions or clarifications directly to the above note. Agree with note above. Patient had a transient episode  of confusion with speech difficulties and dizziness possibly a posterior circulation TIA. Brain imaging is unremarkable. He remains at risk for recurrent strokes and TIAs and need to continue ongoing stroke evaluation. Start aspirin for stroke prevention and aggressive risk factor modification. Outpatient polysomnogram for sleep apnea  Antony Contras, MD Medical Director [facility] Pager: [phone] 01/10/2015 4:02 PM    To contact Stroke Continuity provider, please refer to [email or web address] After hours, contact General Neurology

## 2015-01-10 NOTE — Progress Notes (Signed)
Occupational Therapy Evaluation Patient Details Name: Ryan Burgess MRN: [id number] DOB: 1971-10-03 Today's Date: 01/10/2015    History of Present Illness 44 y.o. male with a past medical history of hypertension, borderline diabetes mellitus, gastroesophageal reflux disease, morbid obesity, presenting to the emergency room with complaints of slurred speech and confusion. CT/MRI - for acute CVA.   Clinical Impression   Pt appears to be at his functional baseline with ADL and mobility. NIH 0. Educated pt on signs/symptoms of CVA. Pt discussed interest in losing weight. Will notify nursing. OT signing off.     Follow Up Recommendations  No OT follow up    Equipment Recommendations  None recommended by OT    Recommendations for Other Services       Precautions / Restrictions Precautions Precautions: None      Mobility Bed Mobility Overal bed mobility: Independent                Transfers Overall transfer level: Independent                    Balance Overall balance assessment: No apparent balance deficits (not formally assessed)                                          ADL Overall ADL's : At baseline                                             Vision                     Perception     Praxis      Pertinent Vitals/Pain Pain Assessment: No/denies pain     Hand Dominance Right   Extremity/Trunk Assessment Upper Extremity Assessment Upper Extremity Assessment: Overall WFL for tasks assessed   Lower Extremity Assessment Lower Extremity Assessment: Overall WFL for tasks assessed   Cervical / Trunk Assessment Cervical / Trunk Assessment: Normal   Communication Communication Communication: No difficulties   Cognition Arousal/Alertness: Awake/alert Behavior During Therapy: WFL for tasks assessed/performed Overall Cognitive Status: Within Functional Limits for tasks assessed                      General Comments   Pt educated on signs/symptoms of CVA using FAST. Pt also verbalized interest in weightt loss.    Exercises       Shoulder Instructions      Home Living Family/patient expects to be discharged to:: Private residence Living Arrangements: Other relatives Available Help at Discharge: Family;Available PRN/intermittently Type of Home: House Home Access: Stairs to enter [employer] of Steps: 13   Home Layout: One level     Bathroom Shower/Tub: Tub/shower unit Shower/tub characteristics: Architectural technologist: Standard Bathroom Accessibility: Yes How Accessible: Accessible via walker Home Equipment: None          Prior Functioning/Environment Level of Independence: Independent             OT Diagnosis: Other (comment) (symptoms resolved)   OT Problem List:     OT Treatment/Interventions:      OT Goals(Current goals can be found in the care plan section) Acute Rehab OT Goals Patient Stated Goal: to lose weight OT Goal Formulation: All assessment and  education complete, DC therapy  OT Frequency:     Barriers to D/C:            Co-evaluation              End of Session Nurse Communication: Mobility status  Activity Tolerance: Patient tolerated treatment well Patient left: in chair;with call bell/phone within reach;with family/visitor present   Time: 0211-1735 OT Time Calculation (min): 14 min Charges:  OT General Charges $OT Visit: 1 Procedure OT Evaluation $Initial OT Evaluation Tier I: 1 Procedure G-Codes:    [name],HILLARY 12-Jan-2015, 10:16 AM   [name], OTR/L  [phone] 01-12-2015

## 2015-01-10 NOTE — Progress Notes (Signed)
Patient ID: SELBY FOISY  male  NWG:[id number]    DOB: 1971-02-09    DOA: 01/09/2015  PCP: Donnie Coffin, MD  Assessment/Plan: Principal Problem:  Encephalopathy acute vs TIA - Completely resolved. Patient was admitted for stroke workup. MRI of the brain showed no acute infarct or hemorrhage. - RA showed no medium or large size vessel significant stenosis or occlusion. - 2-D echo was obtained however was extremely limited study and did not reflect on EF or valves or PFO. This was discussed in detail with Dr. Leonie Man, from stroke service who did not recommend any TEE or cardiac MRI given that patient had no acute infarct on the MRI imaging and it is not convincing that patient had absolute TIA symptoms are not. Dr. Leonie Man however did recommend CT angiogram of the head and neck. - Lipid panel showed LDL 125, patient was not on any antiplatelet therapy prior to admission.  - Patient was started on aspirin 81 mg daily. He was started on Lipitor daily. -  Patient was strongly counseled for diet and weight control.   Active problems  Borderline diabetic - Hemoglobin A1c is in process, patient was strongly recommended diet and weight control   GERD (gastroesophageal reflux disease) - continue PPI   Morbid obesity: BMI 56.8, weight 478 lbs Nutrition consult was obtained. Patient was also consulted strongly on exercise and weight control due to significant lymphedema and pedal edema,  started on Lasix 20 mg daily.   Thrombocytopenia: Unclear etiology Lovenox was discontinued, platelets 109 K at the time of admission. MRI of the brain incidentally tensioned it decreased to signal intensity of bone marrow may be related to patient's habitus. He may benefit from a hematology referral outpatient, will defer to PCP.  DVT Prophylaxis: SCDs  Code Status: Full CODE STATUS  Family Communication: Discussed with patient and his wife at the bedside  Disposition: Pending CT angiogram of the head  and neck  Consultants:  Neurology  Procedures:  MRI, MRA brain  Antibiotics:  None    Subjective: Patient seen and examined, alert and oriented, no acute neurological deficits, no acute issues  Objective: Weight change:  No intake or output data in the 24 hours ending 01/10/15 1441 Blood pressure 155/46, pulse 87, temperature 98.8 F (37.1 C), temperature source Oral, resp. rate 18, height 6\' 5"  (1.956 m), weight 217 kg (478 lb 6.4 oz), SpO2 99 %.  Physical Exam: General: Alert and awake, oriented x3, not in any acute distress. HEENT: anicteric sclera, PERLA, EOMI CVS: S1-S2 clear, no murmur rubs or gallops Chest: clear to auscultation bilaterally, no wheezing, rales or rhonchi Abdomen: Morbidly obese soft nontender, nondistended, normal bowel sounds  Extremities: no cyanosis, clubbing, significant edema/lymphedema noted bilaterally Neuro: Cranial nerves II-XII intact, no focal neurological deficits  Lab Results: Basic Metabolic Panel:  Recent Labs Lab 01/09/15 1816 01/09/15 1830 01/10/15 0645  NA 136 141 136  K 4.1 4.1 3.7  CL 108 108 110  CO2 24  --  18*  GLUCOSE 113* 108* 98  BUN 14 17 17   CREATININE 0.85 0.70 0.83  CALCIUM 8.5  --  8.3*   Liver Function Tests:  Recent Labs Lab 01/09/15 1816  AST 59*  ALT 32  ALKPHOS 82  BILITOT 1.2  PROT 6.5  ALBUMIN 2.7*   No results for input(s): LIPASE, AMYLASE in the last 168 hours.  Recent Labs Lab 01/09/15 1946  AMMONIA 169*   CBC:  Recent Labs Lab 01/09/15 1946 01/10/15 0645  WBC  5.2 4.2  NEUTROABS 2.8  --   HGB 14.2 12.8*  HCT 39.6 37.0*  MCV 93.6 95.1  PLT 109* 94*   Cardiac Enzymes: No results for input(s): CKTOTAL, CKMB, CKMBINDEX, TROPONINI in the last 168 hours. BNP: Invalid input(s): POCBNP CBG: No results for input(s): GLUCAP in the last 168 hours.   Micro Results: No results found for this or any previous visit (from the past 240 hour(s)).  Studies/Results: Dg Chest 2  View  01/09/2015   CLINICAL DATA:  Acute onset of generalized weakness and symptoms of CVA. Initial encounter.  EXAM: CHEST  2 VIEW  COMPARISON:  Chest radiograph performed 02/08/2011  FINDINGS: The lungs are well-aerated. Mild vascular congestion is noted. Minimal bibasilar atelectasis is seen. There is no evidence of pleural effusion or pneumothorax.  The heart is normal in size; the mediastinal contour is within normal limits. No acute osseous abnormalities are seen.  IMPRESSION: Mild vascular congestion noted; minimal bibasilar atelectasis seen.   Electronically Signed   By: Garald Balding M.D.   On: 01/09/2015 22:05   Ct Head Wo Contrast  01/09/2015   CLINICAL DATA:  Confusion.  Dizziness.  Initial encounter.  EXAM: CT HEAD WITHOUT CONTRAST  TECHNIQUE: Contiguous axial images were obtained from the base of the skull through the vertex without intravenous contrast.  COMPARISON:  None.  FINDINGS: No mass lesion, mass effect, midline shift, hydrocephalus, hemorrhage. No territorial ischemia or acute infarction. Scout images appear normal. Visible paranasal sinuses and mastoid air cells normal.  IMPRESSION: Negative CT head.   Electronically Signed   By: Dereck Ligas M.D.   On: 01/09/2015 19:03   Mr Brain Wo Contrast  01/10/2015   CLINICAL DATA:  44 year old hypertensive, obese, borderline diabetic male presenting with episode of slurred speech and difficulty finding words with confusion. Subsequent encounter.  EXAM: MRI HEAD WITHOUT CONTRAST  MRA HEAD WITHOUT CONTRAST  TECHNIQUE: Multiplanar, multiecho pulse sequences of the brain and surrounding structures were obtained without intravenous contrast. Angiographic images of the head were obtained using MRA technique without contrast.  COMPARISON:  01/09/2015 head CT.  No comparison brain MR.  FINDINGS: MRI HEAD FINDINGS  Some of the sequences are motion degraded.  No acute infarct.  No intracranial hemorrhage.  No hydrocephalus.  No intracranial mass lesion  noted on this unenhanced exam.  Decreased signal intensity of bone marrow may be related to patient's habitus. Correlation with CBC to exclude anemia contributing to this appearance may be considered.  Cerebellar tonsils minimally low lying but within range of normal limits. Pituitary region, pineal region and orbital structures unremarkable.  Minimal to mild paranasal sinus mucosal thickening.  Major intracranial vascular structures are patent.  MRA HEAD FINDINGS  Anterior circulation without medium or large size vessel significant stenosis or occlusion.  Minimal irregularity of the left internal carotid artery proximal to the petrous segment may be related to artifact versus minimal narrowing.  Fetal type contribution to the posterior cerebral arteries bilaterally.  Ectatic vertebral arteries and basilar artery without high-grade stenosis.  Poor delineation of the left anterior inferior cerebellar artery.  Narrowing of the P1 segment of the left posterior cerebral artery. This is proximal to the fetal type contribution to the left posterior cerebral artery.  Slight irregularity branch vessels.  No aneurysm noted.  IMPRESSION: MRI HEAD  Some of the sequences are motion degraded.  No acute infarct.  No intracranial hemorrhage.  No intracranial mass lesion noted on this unenhanced exam.  Decreased signal intensity of  bone marrow may be related to patient's habitus. Correlation with CBC to exclude anemia contributing to this appearance may be considered.  Minimal to mild paranasal sinus mucosal thickening.  MRA HEAD FINDINGS  No medium or large size vessel significant stenosis or occlusion.  Please see above.   Electronically Signed   By: Genia Del M.D.   On: 01/10/2015 07:42   Mr Jodene Nam Head/brain Wo Cm  01/10/2015   CLINICAL DATA:  44 year old hypertensive, obese, borderline diabetic male presenting with episode of slurred speech and difficulty finding words with confusion. Subsequent encounter.  EXAM: MRI HEAD  WITHOUT CONTRAST  MRA HEAD WITHOUT CONTRAST  TECHNIQUE: Multiplanar, multiecho pulse sequences of the brain and surrounding structures were obtained without intravenous contrast. Angiographic images of the head were obtained using MRA technique without contrast.  COMPARISON:  01/09/2015 head CT.  No comparison brain MR.  FINDINGS: MRI HEAD FINDINGS  Some of the sequences are motion degraded.  No acute infarct.  No intracranial hemorrhage.  No hydrocephalus.  No intracranial mass lesion noted on this unenhanced exam.  Decreased signal intensity of bone marrow may be related to patient's habitus. Correlation with CBC to exclude anemia contributing to this appearance may be considered.  Cerebellar tonsils minimally low lying but within range of normal limits. Pituitary region, pineal region and orbital structures unremarkable.  Minimal to mild paranasal sinus mucosal thickening.  Major intracranial vascular structures are patent.  MRA HEAD FINDINGS  Anterior circulation without medium or large size vessel significant stenosis or occlusion.  Minimal irregularity of the left internal carotid artery proximal to the petrous segment may be related to artifact versus minimal narrowing.  Fetal type contribution to the posterior cerebral arteries bilaterally.  Ectatic vertebral arteries and basilar artery without high-grade stenosis.  Poor delineation of the left anterior inferior cerebellar artery.  Narrowing of the P1 segment of the left posterior cerebral artery. This is proximal to the fetal type contribution to the left posterior cerebral artery.  Slight irregularity branch vessels.  No aneurysm noted.  IMPRESSION: MRI HEAD  Some of the sequences are motion degraded.  No acute infarct.  No intracranial hemorrhage.  No intracranial mass lesion noted on this unenhanced exam.  Decreased signal intensity of bone marrow may be related to patient's habitus. Correlation with CBC to exclude anemia contributing to this appearance  may be considered.  Minimal to mild paranasal sinus mucosal thickening.  MRA HEAD FINDINGS  No medium or large size vessel significant stenosis or occlusion.  Please see above.   Electronically Signed   By: Genia Del M.D.   On: 01/10/2015 07:42    Medications: Scheduled Meds: . aspirin EC  81 mg Oral Daily  . atorvastatin  20 mg Oral q1800  . furosemide  20 mg Oral Daily  . pantoprazole  40 mg Oral Daily   Time spent 35 minutes   LOS: 1 day   Cydnie Deason M.D. [facility] 01/10/2015, 2:41 PM Pager: [phone]  If 7PM-7AM, please contact night-coverage [email or web address] Password [email or web address]

## 2015-01-10 NOTE — Progress Notes (Signed)
PT Cancellation Note  Patient Details Name: [name] MRN: [id number] DOB: Oct 28, 1971   Cancelled Treatment:    Reason Eval Not Completed: PT screened, no needs identified, will sign off  Spoke with patient and wife and they report pt has been walking independently today and has been up several times with no difficulty. Noted no acute CVA. Further PT eval deferred and pt/wife in agreement.   Shondrika Hoque 01/10/2015, 4:41 PM  Pager [phone]

## 2015-01-10 NOTE — Evaluation (Signed)
Speech Language Pathology Evaluation Patient Details Name: Ryan Burgess MRN: [id number] DOB: 18-Jul-1971 Today's Date: 01/10/2015 Time: 46- 42    Problem List:  Patient Active Problem List   Diagnosis Date Noted  . TIA (transient ischemic attack) 01/10/2015  . Morbid obesity 01/10/2015  . Encephalopathy acute 01/09/2015  . HTN (hypertension) 01/09/2015  . Borderline diabetic 01/09/2015  . GERD (gastroesophageal reflux disease) 01/09/2015  . Acute encephalopathy    Past Medical History:  Past Medical History  Diagnosis Date  . GERD (gastroesophageal reflux disease)   . Obesity    Past Surgical History: History reviewed. No pertinent past surgical history. HPI:  Ryan Burgess is an 44 y.o. male transfer from [facility] for concern of possible ischemic infarct. Patient able to give limited history, perseverates on "I just don't feel right". Per notes and [facility] ED patient was in normal state of health last night at 2200 (last known well to 06/21/2015 at 2200). Woke up and noted to have difficulty with speech, word finding difficulty and noted perseveration. ED at [facility] also notes question of left sided weakness. Due to morbid obesity he was sent to [facility] for further imaging as he would not fit in their scanner. No family present to give further information. Patient was not administered TPA secondary to delay in arrival. He was admitted for further evaluation and treatment.     Assessment / Plan / Recommendation Clinical Impression  Pt and wife report pt initially had speech difficulty, describing dysarthria, dysnomia, paraphasic errors, and perseveration.  These appear to have all resolved, as pt is now conversing normally and was able to complete complex responsive naming tasks and repetition without difficulty.  No f/u ST indicated at this time.    SLP Assessment  Patient does not need any further Speech Lanaguage Pathology Services    Follow Up Recommendations  None    Frequency  and Duration        Pertinent Vitals/Pain Pain Assessment: No/denies pain   SLP Goals  Patient/Family Stated Goal: "I'm ready to go home."  SLP Evaluation Prior Functioning  Cognitive/Linguistic Baseline: Within functional limits Type of Home: House  Lives With: Spouse Available Help at Discharge: Family;Available PRN/intermittently   Cognition  Overall Cognitive Status: Within Functional Limits for tasks assessed Arousal/Alertness: Awake/alert Orientation Level: Oriented X4 Attention: Divided Divided Attention: Appears intact Memory: Appears intact Awareness: Appears intact Problem Solving: Appears intact Safety/Judgment: Appears intact    Comprehension  Auditory Comprehension Overall Auditory Comprehension: Appears within functional limits for tasks assessed Yes/No Questions: Within Functional Limits Conversation: Complex Visual Recognition/Discrimination Discrimination: Not tested Reading Comprehension Reading Status: Not tested    Expression Expression Primary Mode of Expression: Verbal Verbal Expression Overall Verbal Expression: Appears within functional limits for tasks assessed Initiation: No impairment Level of Generative/Spontaneous Verbalization: Conversation Repetition: No impairment Naming: No impairment Pragmatics: No impairment Written Expression Dominant Hand: Right Written Expression: Not tested   Oral / Motor Oral Motor/Sensory Function Overall Oral Motor/Sensory Function: Appears within functional limits for tasks assessed Motor Speech Overall Motor Speech: Appears within functional limits for tasks assessed Respiration: Within functional limits Phonation: Normal Resonance: Within functional limits Articulation: Within functional limitis Intelligibility: Intelligible Motor Planning: Witnin functional limits   GO     Quinn Axe T 01/10/2015, 3:24 PM

## 2015-01-10 NOTE — Progress Notes (Signed)
Echocardiogram 2D Echocardiogram has been performed.  Ryan Burgess 01/10/2015, 8:54 AM

## 2015-01-11 LAB — HEMOGLOBIN A1C
Hgb A1c MFr Bld: 6.1 % — ABNORMAL HIGH (ref 4.8–5.6)
Mean Plasma Glucose: 128 mg/dL

## 2015-01-11 LAB — BASIC METABOLIC PANEL
ANION GAP: 4 — AB (ref 5–15)
BUN: 12 mg/dL (ref 6–23)
CHLORIDE: 108 mmol/L (ref 96–112)
CO2: 24 mmol/L (ref 19–32)
Calcium: 8.1 mg/dL — ABNORMAL LOW (ref 8.4–10.5)
Creatinine, Ser: 0.88 mg/dL (ref 0.50–1.35)
GFR calc Af Amer: >90 mL/min (ref >90)
GFR calc non Af Amer: >90 mL/min (ref >90)
GLUCOSE: 112 mg/dL — AB (ref 70–99)
Potassium: 3.7 mmol/L (ref 3.5–5.1)
SODIUM: 136 mmol/L (ref 135–145)

## 2015-01-11 MED ORDER — BLOOD GLUCOSE MONITOR KIT: PACK | Status: AC

## 2015-01-11 NOTE — Progress Notes (Signed)
UR complete.  Joydan Gretzinger RN, MSN 

## 2015-01-11 NOTE — Progress Notes (Signed)
Patient discharged home with wife. RN reviewed and discussed discharge instructions and medications with patient and spouse. Patient states he understands both.

## 2015-01-11 NOTE — Progress Notes (Signed)
STROKE TEAM PROGRESS NOTE   HISTORY Ryan Burgess is an 44 y.o. male transfer from [facility] for concern of possible ischemic infarct. Patient able to give limited history, perseverates on "I just don't feel right". Per notes and [facility] ED patient was in normal state of health last night at 2200 (last known well to 06/21/2015 at 2200). Woke up and noted to have difficulty with speech, word finding difficulty and noted perseveration. ED at [facility] also notes question of left sided weakness. Due to morbid obesity he was sent to [facility] for further imaging as he would not fit in their scanner. No family present to give further information. Patient was not administered TPA secondary to delay in arrival. He was admitted for further evaluation and treatment.   SUBJECTIVE (INTERVAL HISTORY) Wife at bedisde. Plans for discharge today.   OBJECTIVE Temp:  [98 F (36.7 C)-99.7 F (37.6 C)] 98 F (36.7 C) (02/10 0659) Pulse Rate:  [72-87] 72 (02/10 0659) Cardiac Rhythm:  [-] Normal sinus rhythm (02/10 0410) Resp:  [18-20] 18 (02/10 0659) BP: (132-155)/(46-66) 150/66 mmHg (02/10 0659) SpO2:  [97 %-100 %] 100 % (02/10 0659)  No results for input(s): GLUCAP in the last 168 hours.  Recent Labs Lab 01/09/15 1816 01/09/15 1830 01/10/15 0645  NA 136 141 136  K 4.1 4.1 3.7  CL 108 108 110  CO2 24  --  18*  GLUCOSE 113* 108* 98  BUN 14 17 17   CREATININE 0.85 0.70 0.83  CALCIUM 8.5  --  8.3*    Recent Labs Lab 01/09/15 1816  AST 59*  ALT 32  ALKPHOS 82  BILITOT 1.2  PROT 6.5  ALBUMIN 2.7*    Recent Labs Lab 01/09/15 1830 01/09/15 1946 01/10/15 0645  WBC  --  5.2 4.2  NEUTROABS  --  2.8  --   HGB 14.6 14.2 12.8*  HCT 43.0 39.6 37.0*  MCV  --  93.6 95.1  PLT  --  109* 94*   No results for input(s): CKTOTAL, CKMB, CKMBINDEX, TROPONINI in the last 168 hours.  Recent Labs  01/09/15 1816  LABPROT 15.9*  INR 1.26    Recent Labs  01/09/15 2034  COLORURINE AMBER*  LABSPEC 1.031*   PHURINE 8.0  GLUCOSEU NEGATIVE  HGBUR NEGATIVE  BILIRUBINUR SMALL*  KETONESUR NEGATIVE  PROTEINUR NEGATIVE  UROBILINOGEN 1.0  NITRITE NEGATIVE  LEUKOCYTESUR NEGATIVE       Component Value Date/Time   CHOL 172 01/10/2015 0645   TRIG 58 01/10/2015 0645   HDL 35* 01/10/2015 0645   CHOLHDL 4.9 01/10/2015 0645   VLDL 12 01/10/2015 0645   LDLCALC 125* 01/10/2015 0645   Lab Results  Component Value Date   HGBA1C 6.1* 01/10/2015      Component Value Date/Time   LABOPIA NONE DETECTED 01/09/2015 2034   COCAINSCRNUR NONE DETECTED 01/09/2015 2034   LABBENZ NONE DETECTED 01/09/2015 2034   AMPHETMU NONE DETECTED 01/09/2015 2034   THCU NONE DETECTED 01/09/2015 2034   LABBARB NONE DETECTED 01/09/2015 2034     Recent Labs Lab 01/09/15 1816  ETH <5    Ct Angio Head W/cm &/or Wo Cm  01/10/2015   CLINICAL DATA:  TIA.  EXAM: CT ANGIOGRAPHY HEAD AND NECK  TECHNIQUE: Multidetector CT imaging of the head and neck was performed using the standard protocol during bolus administration of intravenous contrast. Multiplanar CT image reconstructions and MIPs were obtained to evaluate the vascular anatomy. Carotid stenosis measurements (when applicable) are obtained utilizing NASCET criteria, using  the distal internal carotid diameter as the denominator.  CONTRAST:  184mL OMNIPAQUE IOHEXOL 350 MG/ML SOLN 2 contrast injections were made. The first injection was not timed properly and therefore a second injection was made.  COMPARISON:  MRI 01/10/2015  FINDINGS: CT HEAD  Image quality degraded by a large patient size as well as suboptimal arterial opacification.  Brain: Ventricle size is normal. Negative for acute or chronic infarction. Negative for hemorrhage or mass.  Calvarium and skull base: Negative  Paranasal sinuses: Small air-fluid level left maxillary sinus with mucosal thickening also noted.  Orbits: Negative  CTA NECK  Aortic arch: Not well visualized due to large patient size.  Right carotid  system: Right common carotid artery widely patent. Right carotid bifurcation is normal. No significant atherosclerotic disease or dissection.  Left carotid system: Left common carotid artery widely patent. Left carotid bifurcation widely patent. Negative for dissection or stenosis.  Vertebral arteries:Vertebral arteries are relatively small and not well seen but appear to be patent to the basilar. There is fetal origin of the posterior cerebral artery bilaterally leading to hypoplastic vertebral arteries.  Skeleton: Cervical spondylosis with ossification posterior longitudinal ligament C3 through C7 leading to spinal stenosis. No fracture or mass lesion  Other neck:  No adenopathy.  CTA HEAD  Anterior circulation: Internal carotid artery widely patent bilaterally without stenosis. Anterior and posterior cerebral arteries patent bilaterally without stenosis.  Posterior circulation: Both vertebral arteries are patent to the basilar. The basilar is small and hypoplastic but patent. Fetal origin of the posterior cerebral artery bilaterally with hypoplastic P1 segments bilaterally. Posterior cerebral arteries are patent bilaterally.  Venous sinuses: Patent  Anatomic variants: None  Delayed phase: Normal enhancement.  Negative for cerebral aneurysm.  IMPRESSION: Suboptimal study due to large patient size. The patient had two injections of contrast due to mis timing of the scan on the first injection.  Negative for significant carotid or vertebral stenosis in the neck.  Negative CTA of the head.  OPLL in the cervical spine with spinal stenosis.   Electronically Signed   By: Franchot Gallo M.D.   On: 01/10/2015 20:23   Dg Chest 2 View  01/09/2015   CLINICAL DATA:  Acute onset of generalized weakness and symptoms of CVA. Initial encounter.  EXAM: CHEST  2 VIEW  COMPARISON:  Chest radiograph performed 02/08/2011  FINDINGS: The lungs are well-aerated. Mild vascular congestion is noted. Minimal bibasilar atelectasis is seen.  There is no evidence of pleural effusion or pneumothorax.  The heart is normal in size; the mediastinal contour is within normal limits. No acute osseous abnormalities are seen.  IMPRESSION: Mild vascular congestion noted; minimal bibasilar atelectasis seen.   Electronically Signed   By: Garald Balding M.D.   On: 01/09/2015 22:05   Ct Head Wo Contrast  01/09/2015   CLINICAL DATA:  Confusion.  Dizziness.  Initial encounter.  EXAM: CT HEAD WITHOUT CONTRAST  TECHNIQUE: Contiguous axial images were obtained from the base of the skull through the vertex without intravenous contrast.  COMPARISON:  None.  FINDINGS: No mass lesion, mass effect, midline shift, hydrocephalus, hemorrhage. No territorial ischemia or acute infarction. Scout images appear normal. Visible paranasal sinuses and mastoid air cells normal.  IMPRESSION: Negative CT head.   Electronically Signed   By: Dereck Ligas M.D.   On: 01/09/2015 19:03   Ct Angio Neck W/cm &/or Wo/cm  01/10/2015   CLINICAL DATA:  TIA.  EXAM: CT ANGIOGRAPHY HEAD AND NECK  TECHNIQUE: Multidetector CT imaging  of the head and neck was performed using the standard protocol during bolus administration of intravenous contrast. Multiplanar CT image reconstructions and MIPs were obtained to evaluate the vascular anatomy. Carotid stenosis measurements (when applicable) are obtained utilizing NASCET criteria, using the distal internal carotid diameter as the denominator.  CONTRAST:  169mL OMNIPAQUE IOHEXOL 350 MG/ML SOLN 2 contrast injections were made. The first injection was not timed properly and therefore a second injection was made.  COMPARISON:  MRI 01/10/2015  FINDINGS: CT HEAD  Image quality degraded by a large patient size as well as suboptimal arterial opacification.  Brain: Ventricle size is normal. Negative for acute or chronic infarction. Negative for hemorrhage or mass.  Calvarium and skull base: Negative  Paranasal sinuses: Small air-fluid level left maxillary sinus  with mucosal thickening also noted.  Orbits: Negative  CTA NECK  Aortic arch: Not well visualized due to large patient size.  Right carotid system: Right common carotid artery widely patent. Right carotid bifurcation is normal. No significant atherosclerotic disease or dissection.  Left carotid system: Left common carotid artery widely patent. Left carotid bifurcation widely patent. Negative for dissection or stenosis.  Vertebral arteries:Vertebral arteries are relatively small and not well seen but appear to be patent to the basilar. There is fetal origin of the posterior cerebral artery bilaterally leading to hypoplastic vertebral arteries.  Skeleton: Cervical spondylosis with ossification posterior longitudinal ligament C3 through C7 leading to spinal stenosis. No fracture or mass lesion  Other neck:  No adenopathy.  CTA HEAD  Anterior circulation: Internal carotid artery widely patent bilaterally without stenosis. Anterior and posterior cerebral arteries patent bilaterally without stenosis.  Posterior circulation: Both vertebral arteries are patent to the basilar. The basilar is small and hypoplastic but patent. Fetal origin of the posterior cerebral artery bilaterally with hypoplastic P1 segments bilaterally. Posterior cerebral arteries are patent bilaterally.  Venous sinuses: Patent  Anatomic variants: None  Delayed phase: Normal enhancement.  Negative for cerebral aneurysm.  IMPRESSION: Suboptimal study due to large patient size. The patient had two injections of contrast due to mis timing of the scan on the first injection.  Negative for significant carotid or vertebral stenosis in the neck.  Negative CTA of the head.  OPLL in the cervical spine with spinal stenosis.   Electronically Signed   By: Franchot Gallo M.D.   On: 01/10/2015 20:23   Mr Brain Wo Contrast  01/10/2015   CLINICAL DATA:  44 year old hypertensive, obese, borderline diabetic male presenting with episode of slurred speech and difficulty  finding words with confusion. Subsequent encounter.  EXAM: MRI HEAD WITHOUT CONTRAST  MRA HEAD WITHOUT CONTRAST  TECHNIQUE: Multiplanar, multiecho pulse sequences of the brain and surrounding structures were obtained without intravenous contrast. Angiographic images of the head were obtained using MRA technique without contrast.  COMPARISON:  01/09/2015 head CT.  No comparison brain MR.  FINDINGS: MRI HEAD FINDINGS  Some of the sequences are motion degraded.  No acute infarct.  No intracranial hemorrhage.  No hydrocephalus.  No intracranial mass lesion noted on this unenhanced exam.  Decreased signal intensity of bone marrow may be related to patient's habitus. Correlation with CBC to exclude anemia contributing to this appearance may be considered.  Cerebellar tonsils minimally low lying but within range of normal limits. Pituitary region, pineal region and orbital structures unremarkable.  Minimal to mild paranasal sinus mucosal thickening.  Major intracranial vascular structures are patent.  MRA HEAD FINDINGS  Anterior circulation without medium or large size vessel significant  stenosis or occlusion.  Minimal irregularity of the left internal carotid artery proximal to the petrous segment may be related to artifact versus minimal narrowing.  Fetal type contribution to the posterior cerebral arteries bilaterally.  Ectatic vertebral arteries and basilar artery without high-grade stenosis.  Poor delineation of the left anterior inferior cerebellar artery.  Narrowing of the P1 segment of the left posterior cerebral artery. This is proximal to the fetal type contribution to the left posterior cerebral artery.  Slight irregularity branch vessels.  No aneurysm noted.  IMPRESSION: MRI HEAD  Some of the sequences are motion degraded.  No acute infarct.  No intracranial hemorrhage.  No intracranial mass lesion noted on this unenhanced exam.  Decreased signal intensity of bone marrow may be related to patient's habitus.  Correlation with CBC to exclude anemia contributing to this appearance may be considered.  Minimal to mild paranasal sinus mucosal thickening.  MRA HEAD FINDINGS  No medium or large size vessel significant stenosis or occlusion.  Please see above.   Electronically Signed   By: Genia Del M.D.   On: 01/10/2015 07:42   Mr Jodene Nam Head/brain Wo Cm  01/10/2015   CLINICAL DATA:  44 year old hypertensive, obese, borderline diabetic male presenting with episode of slurred speech and difficulty finding words with confusion. Subsequent encounter.  EXAM: MRI HEAD WITHOUT CONTRAST  MRA HEAD WITHOUT CONTRAST  TECHNIQUE: Multiplanar, multiecho pulse sequences of the brain and surrounding structures were obtained without intravenous contrast. Angiographic images of the head were obtained using MRA technique without contrast.  COMPARISON:  01/09/2015 head CT.  No comparison brain MR.  FINDINGS: MRI HEAD FINDINGS  Some of the sequences are motion degraded.  No acute infarct.  No intracranial hemorrhage.  No hydrocephalus.  No intracranial mass lesion noted on this unenhanced exam.  Decreased signal intensity of bone marrow may be related to patient's habitus. Correlation with CBC to exclude anemia contributing to this appearance may be considered.  Cerebellar tonsils minimally low lying but within range of normal limits. Pituitary region, pineal region and orbital structures unremarkable.  Minimal to mild paranasal sinus mucosal thickening.  Major intracranial vascular structures are patent.  MRA HEAD FINDINGS  Anterior circulation without medium or large size vessel significant stenosis or occlusion.  Minimal irregularity of the left internal carotid artery proximal to the petrous segment may be related to artifact versus minimal narrowing.  Fetal type contribution to the posterior cerebral arteries bilaterally.  Ectatic vertebral arteries and basilar artery without high-grade stenosis.  Poor delineation of the left anterior  inferior cerebellar artery.  Narrowing of the P1 segment of the left posterior cerebral artery. This is proximal to the fetal type contribution to the left posterior cerebral artery.  Slight irregularity branch vessels.  No aneurysm noted.  IMPRESSION: MRI HEAD  Some of the sequences are motion degraded.  No acute infarct.  No intracranial hemorrhage.  No intracranial mass lesion noted on this unenhanced exam.  Decreased signal intensity of bone marrow may be related to patient's habitus. Correlation with CBC to exclude anemia contributing to this appearance may be considered.  Minimal to mild paranasal sinus mucosal thickening.  MRA HEAD FINDINGS  No medium or large size vessel significant stenosis or occlusion.  Please see above.   Electronically Signed   By: Genia Del M.D.   On: 01/10/2015 07:42   2D Echocardiogram  The images are extremely limited. Nothing can beseen on the standard images. I did approve proceeding withDefinity Contrast. Vague images are  seen with definity. Theimpression is given that the LV may not be dlated. There may beadequate LV motion. TEE or MUGA or cardiac MR would be needed toassess LV function further.   PHYSICAL EXAM Morbidly obese middle-age Caucasian male currently not in distress. . Afebrile. Head is nontraumatic. Neck is supple without bruit.    Cardiac exam no murmur or gallop. Lungs are clear to auscultation. Distal pulses are well felt. Neurological Exam ;  Awake  Alert oriented x 3. Normal speech and language.eye movements full without nystagmus.fundi were not visualized. Vision acuity and fields appear normal. Hearing is normal. Palatal movements are normal. Face symmetric. Tongue midline. Normal strength, tone, reflexes and coordination. Normal sensation. Gait deferred.   ASSESSMENT/PLAN [name] is a 44 y.o. male with history of hypertension, prediabetes, gastroesophageal reflux disease and morbid obesity presenting with slurred speech and  confusion. He did not receive IV t-PA due to delay in arrival.   Transient speech disturbance and hand shaking, etiology unknown. Vascular workup unrevealing.   Resultant  Neurologic deficits resolved. Denies headache. Related sinus pain.  MRI  No acute stroke  MRA  Unremarkable   Carotid Doppler  Cancelled. See CTA  CT angiogram head and neck suboptimal study. Negative for carotid or VA stenosis in neck. Neg CA head. Cervical spinal stenosis.  2D Echo  Inadequate. No need for MUGA or TEE from Dr. Clydene Fake standpoint  SCDs for VTE prophylaxis  Diet Carb Modified thin liquids  no antithrombotic prior to admission, now on aspirin 81 mg orally every day  Patient counseled to be compliant with his antithrombotic medications  Ongoing aggressive stroke risk factor management  Therapy recommendations:  No OT or PT needs  Disposition:  Discharge home  Okay for discharge from stroke standpoint   Follow-up with Dr. Leonie Man in 1 month. (Order written)  OP sleep study  No further workup  Hypertension  Stable  Hyperlipidemia  Home meds:  No statin  LDL 125, goal < 70  New lipitor 20 mg this admission  Continue statin at discharge  Pre-Diabetes  HgbA1c 6.1, goal < 7.0  Other Stroke Risk Factors  Morbid Obesity, Body mass index is 56.72 kg/(m^2).   Obstructive sleep apnea per wife, has not had formal testing. I had made arrangements for OP testing with [facility].  Hx migraines as a young man.   Other Active Problems  GERD. On coated aspirin.  Stroke Service will sign off   Hospital day # [place] for Pager information 01/11/2015 9:46 AM  I have personally examined this patient, reviewed notes, independently viewed imaging studies, participated in medical decision making and plan of care. I have made any additions or clarifications directly to the above note. Agree with note above.    Antony Contras, MD Medical  Director [facility] Pager: [phone] 01/11/2015 2:45 PM   To contact Stroke Continuity provider, please refer to [email or web address] After hours, contact General Neurology

## 2015-01-11 NOTE — Discharge Instructions (Signed)
Blood Glucose Monitoring °Monitoring your blood glucose (also know as blood sugar) helps you to manage your diabetes. It also helps you and your health care provider monitor your diabetes and determine how well your treatment plan is working. °WHY SHOULD YOU MONITOR YOUR BLOOD GLUCOSE? °· It can help you understand how food, exercise, and medicine affect your blood glucose. °· It allows you to know what your blood glucose is at any given moment. You can quickly tell if you are having low blood glucose (hypoglycemia) or high blood glucose (hyperglycemia). °· It can help you and your health care provider know how to adjust your medicines. °· It can help you understand how to manage an illness or adjust medicine for exercise. °WHEN SHOULD YOU TEST? °Your health care provider will help you decide how often you should check your blood glucose. This may depend on the type of diabetes you have, your diabetes control, or the types of medicines you are taking. Be sure to write down all of your blood glucose readings so that this information can be reviewed with your health care provider. See below for examples of testing times that your health care provider may suggest. °Type 1 Diabetes °· Test 4 times a day if you are in good control, using an insulin pump, or perform multiple daily injections. °· If your diabetes is not well controlled or if you are sick, you may need to monitor more often. °· It is a good idea to also monitor: °· Before and after exercise. °· Between meals and 2 hours after a meal. °· Occasionally between 2:00 a.m. and 3:00 a.m. °Type 2 Diabetes °· It can vary with each person, but generally, if you are on insulin, test 4 times a day. °· If you take medicines by mouth (orally), test 2 times a day. °· If you are on a controlled diet, test once a day. °· If your diabetes is not well controlled or if you are sick, you may need to monitor more often. °HOW TO MONITOR YOUR BLOOD GLUCOSE °Supplies  Needed °· Blood glucose meter. °· Test strips for your meter. Each meter has its own strips. You must use the strips that go with your own meter. °· A pricking needle (lancet). °· A device that holds the lancet (lancing device). °· A journal or log book to write down your results. °Procedure °· Wash your hands with soap and water. Alcohol is not preferred. °· Prick the side of your finger (not the tip) with the lancet. °· Gently milk the finger until a small drop of blood appears. °· Follow the instructions that come with your meter for inserting the test strip, applying blood to the strip, and using your blood glucose meter. °Other Areas to Get Blood for Testing °Some meters allow you to use other areas of your body (other than your finger) to test your blood. These areas are called alternative sites. The most common alternative sites are: °· The forearm. °· The thigh. °· The back area of the lower leg. °· The palm of the hand. °The blood flow in these areas is slower. Therefore, the blood glucose values you get may be delayed, and the numbers are different from what you would get from your fingers. Do not use alternative sites if you think you are having hypoglycemia. Your reading will not be accurate. Always use a finger if you are having hypoglycemia. Also, if you cannot feel your lows (hypoglycemia unawareness), always use your fingers for your   blood glucose checks. ADDITIONAL TIPS FOR GLUCOSE MONITORING  Do not reuse lancets.  Always carry your supplies with you.  All blood glucose meters have a 24-hour "hotline" number to call if you have questions or need help.  Adjust (calibrate) your blood glucose meter with a control solution after finishing a few boxes of strips. BLOOD GLUCOSE RECORD KEEPING It is a good idea to keep a daily record or log of your blood glucose readings. Most glucose meters, if not all, keep your glucose records stored in the meter. Some meters come with the ability to download  your records to your home computer. Keeping a record of your blood glucose readings is especially helpful if you are wanting to look for patterns. Make notes to go along with the blood glucose readings because you might forget what happened at that exact time. Keeping good records helps you and your health care provider to work together to achieve good diabetes management.  Document Released: 11/21/2003 Document Revised: 04/04/2014 Document Reviewed: 04/12/2013 [facility] Patient Information 2015 [place], [place]. This information is not intended to replace advice given to you by your health care provider. Make sure you discuss any questions you have with your health care provider.  Diabetes and Foot Care Diabetes may cause you to have problems because of poor blood supply (circulation) to your feet and legs. This may cause the skin on your feet to become thinner, break easier, and heal more slowly. Your skin may become dry, and the skin may peel and crack. You may also have nerve damage in your legs and feet causing decreased feeling in them. You may not notice minor injuries to your feet that could lead to infections or more serious problems. Taking care of your feet is one of the most important things you can do for yourself.  HOME CARE INSTRUCTIONS  Wear shoes at all times, even in the house. Do not go barefoot. Bare feet are easily injured.  Check your feet daily for blisters, cuts, and redness. If you cannot see the bottom of your feet, use a mirror or ask someone for help.  Wash your feet with warm water (do not use hot water) and mild soap. Then pat your feet and the areas between your toes until they are completely dry. Do not soak your feet as this can dry your skin.  Apply a moisturizing lotion or petroleum jelly (that does not contain alcohol and is unscented) to the skin on your feet and to dry, brittle toenails. Do not apply lotion between your toes.  Trim your toenails straight across. Do  not dig under them or around the cuticle. File the edges of your nails with an emery board or nail file.  Do not cut corns or calluses or try to remove them with medicine.  Wear clean socks or stockings every day. Make sure they are not too tight. Do not wear knee-high stockings since they may decrease blood flow to your legs.  Wear shoes that fit properly and have enough cushioning. To break in new shoes, wear them for just a few hours a day. This prevents you from injuring your feet. Always look in your shoes before you put them on to be sure there are no objects inside.  Do not cross your legs. This may decrease the blood flow to your feet.  If you find a minor scrape, cut, or break in the skin on your feet, keep it and the skin around it clean and dry. These  areas may be cleansed with mild soap and water. Do not cleanse the area with peroxide, alcohol, or iodine.  When you remove an adhesive bandage, be sure not to damage the skin around it.  If you have a wound, look at it several times a day to make sure it is healing.  Do not use heating pads or hot water bottles. They may burn your skin. If you have lost feeling in your feet or legs, you may not know it is happening until it is too late.  Make sure your health care provider performs a complete foot exam at least annually or more often if you have foot problems. Report any cuts, sores, or bruises to your health care provider immediately. SEEK MEDICAL CARE IF:   You have an injury that is not healing.  You have cuts or breaks in the skin.  You have an ingrown nail.  You notice redness on your legs or feet.  You feel burning or tingling in your legs or feet.  You have pain or cramps in your legs and feet.  Your legs or feet are numb.  Your feet always feel cold. SEEK IMMEDIATE MEDICAL CARE IF:   There is increasing redness, swelling, or pain in or around a wound.  There is a red line that goes up your leg.  Pus is  coming from a wound.  You develop a fever or as directed by your health care provider.  You notice a bad smell coming from an ulcer or wound. Document Released: 11/15/2000 Document Revised: 07/21/2013 Document Reviewed: 04/27/2013 [facility] Patient Information 2015 [place], [place]. This information is not intended to replace advice given to you by your health care provider. Make sure you discuss any questions you have with your health care provider.  Basic Carbohydrate Counting for Diabetes Mellitus Carbohydrate counting is a method for keeping track of the amount of carbohydrates you eat. Eating carbohydrates naturally increases the level of sugar (glucose) in your blood, so it is important for you to know the amount that is okay for you to have in every meal. Carbohydrate counting helps keep the level of glucose in your blood within normal limits. The amount of carbohydrates allowed is different for every person. A dietitian can help you calculate the amount that is right for you. Once you know the amount of carbohydrates you can have, you can count the carbohydrates in the foods you want to eat. Carbohydrates are found in the following foods:  Grains, such as breads and cereals.  Dried beans and soy products.  Starchy vegetables, such as potatoes, peas, and corn.  Fruit and fruit juices.  Milk and yogurt.  Sweets and snack foods, such as cake, cookies, candy, chips, soft drinks, and fruit drinks. CARBOHYDRATE COUNTING There are two ways to count the carbohydrates in your food. You can use either of the methods or a combination of both. Reading the "Nutrition Facts" on [place] The "Nutrition Facts" is an area that is included on the labels of almost all packaged food and beverages in the [place]. It includes the serving size of that food or beverage and information about the nutrients in each serving of the food, including the grams (g) of carbohydrate per serving.  Decide the  number of servings of this food or beverage that you will be able to eat or drink. Multiply that number of servings by the number of grams of carbohydrate that is listed on the label for that serving. The  total will be the amount of carbohydrates you will be having when you eat or drink this food or beverage. Learning Standard Serving Sizes of Food When you eat food that is not packaged or does not include "Nutrition Facts" on the label, you need to measure the servings in order to count the amount of carbohydrates.A serving of most carbohydrate-rich foods contains about 15 g of carbohydrates. The following list includes serving sizes of carbohydrate-rich foods that provide 15 g ofcarbohydrate per serving:   1 slice of bread (1 oz) or 1 six-inch tortilla.    of a hamburger bun or English muffin.  4-6 crackers.   cup unsweetened dry cereal.    cup hot cereal.   cup rice or pasta.    cup mashed potatoes or  of a large baked potato.  1 cup fresh fruit or one small piece of fruit.    cup canned or frozen fruit or fruit juice.  1 cup milk.   cup plain fat-free yogurt or yogurt sweetened with artificial sweeteners.   cup cooked dried beans or starchy vegetable, such as peas, corn, or potatoes.  Decide the number of standard-size servings that you will eat. Multiply that number of servings by 15 (the grams of carbohydrates in that serving). For example, if you eat 2 cups of strawberries, you will have eaten 2 servings and 30 g of carbohydrates (2 servings x 15 g = 30 g). For foods such as soups and casseroles, in which more than one food is mixed in, you will need to count the carbohydrates in each food that is included. EXAMPLE OF CARBOHYDRATE COUNTING Sample Dinner  3 oz chicken breast.   cup of brown rice.   cup of corn.  1 cup milk.   1 cup strawberries with sugar-free whipped topping.  Carbohydrate Calculation Step 1: Identify the foods that contain  carbohydrates:   Rice.   Corn.   Milk.   Strawberries. Step 2:Calculate the number of servings eaten of each:   2 servings of rice.   1 serving of corn.   1 serving of milk.   1 serving of strawberries. Step 3: Multiply each of those number of servings by 15 g:   2 servings of rice x 15 g = 30 g.   1 serving of corn x 15 g = 15 g.   1 serving of milk x 15 g = 15 g.   1 serving of strawberries x 15 g = 15 g. Step 4: Add together all of the amounts to find the total grams of carbohydrates eaten: 30 g + 15 g + 15 g + 15 g = 75 g. Document Released: 11/18/2005 Document Revised: 04/04/2014 Document Reviewed: 10/15/2013 [facility] Patient Information 2015 [place], [place]. This information is not intended to replace advice given to you by your health care provider. Make sure you discuss any questions you have with your health care provider.

## 2015-01-11 NOTE — Progress Notes (Signed)
  RD consulted for nutrition education regarding diabetes and weight management.    Lab Results  Component Value Date   HGBA1C 6.1* 01/10/2015   Lipid Panel     Component Value Date/Time   CHOL 172 01/10/2015 0645   TRIG 58 01/10/2015 0645   HDL 35* 01/10/2015 0645   CHOLHDL 4.9 01/10/2015 0645   VLDL 12 01/10/2015 0645   LDLCALC 125* 01/10/2015 0645    RD provided "Type 2 Diabetes Nutrition" handout from the Academy of [employer]etics. Discussed different food groups and their effects on blood sugar, emphasizing carbohydrate-containing foods. Provided list of carbohydrates and recommended serving sizes of common foods. Encouraged pt to aim for 5 servings of carbohydrate or less at each meal. Provided sample menus.   Discussed importance of controlled and consistent carbohydrate intake throughout the day. Provided examples of ways to balance meals/snacks and encouraged intake of high-fiber, whole grain complex carbohydrates.   RD also provided "Weight Loss Tips" handout from the Academy of [employer] and Dietetics. Emphasized the importance of serving sizes and provided examples of correct portions of common foods. Encouraged intake of fruits and vegetables and discouraged intake of fried foods and fast food. Emphasized the importance of hydration with calorie-free beverages and limiting sugar-sweetened beverages. Encouraged pt to discuss physical activity options with physician.   Teach back method used.  Expect good compliance.  Body mass index is 56.72 kg/(m^2). Pt meets criteria for Morbid Obesity based on current BMI.  Current diet order is Carb Modified, patient is consuming approximately 100% of meals at this time. Labs and medications reviewed. No further nutrition interventions warranted at this time. RD contact information provided. If additional nutrition issues arise, please re-consult RD.  Pryor Ochoa RD, LDN Inpatient Clinical Dietitian Pager: [phone] After  Hours Pager: [phone]

## 2015-01-12 NOTE — Progress Notes (Signed)
OT Note - Addendum    02/06/15 1005  OT Visit Information  Last OT Received On 02/06/2015  OT G-codes **NOT FOR INPATIENT CLASS**  Functional Assessment Tool Used clinical judgement  Functional Limitation Self care  Self Care Current Status [phone]) [facility]  Self Care Goal Status [id number]) [facility]  Self Care Discharge Status [id number]) [place], OTR/L  [phone] 06-Feb-2015

## 2015-01-25 ENCOUNTER — Ambulatory Visit (INDEPENDENT_AMBULATORY_CARE_PROVIDER_SITE_OTHER): Payer: BLUE CROSS/BLUE SHIELD | Admitting: Neurology

## 2015-01-25 ENCOUNTER — Encounter: Payer: Self-pay | Admitting: Neurology

## 2015-01-25 VITALS — BP 146/72 | HR 86 | Resp 17 | Ht 77.0 in | Wt >= 6400 oz

## 2015-01-25 DIAGNOSIS — R0683 Snoring: Secondary | ICD-10-CM | POA: Diagnosis not present

## 2015-01-25 DIAGNOSIS — E7800 Pure hypercholesterolemia, unspecified: Secondary | ICD-10-CM

## 2015-01-25 DIAGNOSIS — E662 Morbid (severe) obesity with alveolar hypoventilation: Secondary | ICD-10-CM | POA: Diagnosis not present

## 2015-01-25 DIAGNOSIS — Z6841 Body Mass Index (BMI) 40.0 and over, adult: Secondary | ICD-10-CM

## 2015-01-25 DIAGNOSIS — G459 Transient cerebral ischemic attack, unspecified: Secondary | ICD-10-CM | POA: Diagnosis not present

## 2015-01-25 HISTORY — DX: Morbid (severe) obesity due to excess calories: E66.01

## 2015-01-25 NOTE — Progress Notes (Signed)
[facility]   Provider:  Larey Seat, M D  Referring Provider: Donzetta Starch, NP Primary Care Physician:  Donnie Coffin, MD  Chief Complaint  Patient presents with  . Transient Ischemic Attack    # 11 New Patient   . Snoring    HPI:  Ryan Burgess is a 44 y.o. male  seen here as a referral  from Dr. Leonie Man , after a hospitalization 14 days ago on the stroke service.,  Ryan Burgess carries the diagnosis of hypertension, borderline diabetes which in H be A1c of 7.0 12- 2014 and earlier this month at 6.1,  morbid obesity, peripheral edema, hyperlipidemia, thrombocytopenia.   The patient was admitted with a possible TIA versus encephalopathy of other origin. He reported no weakness on one side versus the other, he had slurred speech, was having difficulties with finding words, answering questions ( he stated his birthyear was [birth year]). A stroke was not found during hospitalization,  the MRI and MRA were negative for a vascular disease,  a 2-D echo was limited but did show no PFO CT angiogram of head and neck was negative for any vascular occlusion or vascular stenosis. The patient was started on aspirin 81 mg daily and on Lipitor daily. He was strongly advised to go on a diet for weight control. He is referred today to look for additional risk factors for TIA and stroke : the possibility of obstructive sleep apnea being present. Ryan Burgess lost additional 12 pounds since his hospitalization and has implemented a low-carb diet.  I reviewed his labs from the hospitalization from 2832 10-16 which looked absolutely normal to me.  The platelet count was 109,000. The ammonia at 169 was slightly elevated.  He reported no weakness on one side versus the other, he had slurred speech, was having difficulties with finding words, answering questions ( he stated his birthyear was [birth year]). This state of confusion lasted for about 10 hours. It resolved without any medication or any  intervention. As to the possibility of having sleep apnea, Ryan Burgess is married and his wife has observed his sleep she also has reported to him that he is a loud snorer and she has witnessed apneas as has the hospital staff during his hospitalization. His practitioner and ICU nurse both stated that he had witnessed sleep apnea is doing his hospital stay. These were associated with significant desaturations in oxygen and the possibility of CO2 retention was raised in a setting that is usually described as obesity hypoventilation syndrome. They found Ryan Burgess to be a shallow breather and in relation to his body mass index he may not be able to exhale CO2 and exchange it for oxygen. The retention of CO2 also called hypercapnia can then lead to confusion, global headaches, and significant nocturia of his enuresis.   The patient goes to bed between 10 and 11 PM and usually does not have trouble to fall asleep. His sleep latency is between 15 and 30 minutes usually. He would have usually only one bathroom break at night about 1:59 AM and he recalls that dreams stages seemed to start after the bathroom break so it is likely that his REM sleep takes place in the later night or early morning hours. 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 The patient prefers to sleep on his side, and he wakes up on  his side as well. His alarm clock is usually set for 6 AM but his wife states that for 5 AM and he often wakes up with that already. He wakes up most mornings with a morning headache that resolves as the day goes off on, also a symptom of hypercapnia. He does not necessarily feel refreshed or restored in the morning.  He would get an average of 6 hours of sleep at night he rarely naps in daytime letting the opportunity to do so. After lunch she is usually quite sleepy. He endorsed the Epworth sleepiness score  at 18 points of the fatigue severity score at 33 points. On weekends he also can sleep in but usually doesn't feel more restored from the additional hour of sleep. The patient does not smoke and uses no tobacco products as all. The patient drinks only socially not regularly he endorsed 1-2 drinks every 3 months. He does drink caffeine 1 cup or soda on some days .    The family history positive for sleep apnea in his father, grandfather did have witnessed apnea.  No ENT surgery, neck surgery or Trauma reported.  He is not a shift worker, regularly works from  8.30  To 5 o'clock.       Review of Systems: Out of a complete 14 system review, the patient complains of only the following symptoms, and all other reviewed systems are negative.   Epworth score 18, Fatigue severity score 33 , depression score 2   History   Social History  . Marital Status: Married    Spouse Name: Ryan Burgess  . Number of Children: 2  . Years of Education: masters   Occupational History  . Not on file.   Social History Main Topics  . Smoking status: Never Smoker   . Smokeless tobacco: Never Used  . Alcohol Use: 0.6 - 1.2 oz/week    1-2 Standard drinks or equivalent per week     Comment: every three months  . Drug Use: No  . Sexual Activity: Not on file   Other Topics Concern  . Not on file   Social History Narrative   Patient lives at home with his wife Ryan Burgess) and two children.    Patient works full time at [employer] in [place].   Right handed.   Caffeine 1-2 sodas and coffee daily.    Family History  Problem Relation Age of Onset  . Leukemia Mother   . Heart disease Maternal Grandmother   . Heart disease Paternal Grandmother   . Diabetes Father   . Dementia Paternal Grandmother   . COPD Maternal Grandmother     Past Medical History  Diagnosis Date  . GERD (gastroesophageal reflux disease)   . Obesity   . Hypertension   . Snoring   . TIA  (transient ischemic attack)   . High cholesterol     Past Surgical History  Procedure Laterality Date  . Mouth surgery      Current Outpatient Prescriptions  Medication Sig Dispense Refill  . aspirin EC 81 MG EC tablet Take 1 tablet (81 mg total) by mouth daily. 30 tablet 11  . atorvastatin (LIPITOR) 20 MG tablet Take 1 tablet (20 mg total) by mouth at bedtime. 30 tablet 5  . blood glucose meter kit and supplies KIT Dispense based on patient and insurance preference. Use up to four times daily as directed. (FOR ICD-9 250.00, 250.01). 1 each 0  . Dexlansoprazole  30 MG capsule Take 30 mg by mouth daily.    . furosemide (LASIX) 20 MG tablet Take 1 tablet (20 mg total) by mouth daily. 30 tablet 6  . ibuprofen (ADVIL,MOTRIN) 200 MG tablet Take 400 mg by mouth every 6 (six) hours as needed for moderate pain.    . phenylephrine (SUDAFED PE) 10 MG TABS tablet Take 10 mg by mouth every 4 (four) hours as needed (for congestion).      No current facility-administered medications for this visit.    Allergies as of 01/25/2015  . (No Known Allergies)    Vitals: BP 146/72 mmHg  Pulse 86  Resp 17  Ht _0  (1.956 m)  Wt 507 lb (229.974 kg)  BMI 60.11 kg/m2 Last Weight:  Wt Readings from Last 1 Encounters:  01/25/15 507 lb (229.974 kg)       Last Height:   Ht Readings from Last 1 Encounters:  01/25/15 _1  (1.956 m)    Physical exam:  General: The patient is awake, alert and appears not in acute distress. The patient is well groomed. Head: Normocephalic, atraumatic. Neck is supple. Mallampati 2 ,  neck circumference: 21  . Nasal airflow restricted , TMJ is not evident . Retrognathia is not seen.  He did wear braces and had a retainer.  Cardiovascular:  Regular rate and rhythm , without  murmurs or carotid bruit, and without distended neck veins. Respiratory: Lungs are clear to auscultation. Skin:  Without evidence of edema, or rash Trunk: BMI is severly  elevated and patient  has  normal posture.  Neurologic exam : The patient is awake and alert, oriented to place and time.   Memory subjective  described as intact. There is a normal attention span & concentration ability. Speech is fluent without  dysarthria, dysphonia or aphasia. Mood and affect are appropriate.  Cranial nerves: Pupils are inequal  the right pupil is about 1 mm larger than the left and constricts not as quickly to light and not completely. The patient recalls that he had laser surgery to the right eye. . Funduscopic exam without evidence of pallor or edema. Extraocular movements  in vertical and horizontal planes intact and without nystagmus. Visual fields by finger perimetry are intact. Hearing to finger rub intact.  Facial sensation intact to fine touch. Facial motor strength is symmetric and tongue and uvula move midline.  Motor exam:Normal tone ,muscle bulk and symmetric strength in all extremities.  Sensory:  Fine touch, pinprick and vibration were tested in all extremities.  Proprioception is normal.  Coordination: Rapid alternating movements in the fingers/hands is normal. Finger-to-nose maneuver  normal without evidence of ataxia, dysmetria or tremor.  Gait and station: Patient walks without assistive device and is able unassisted to climb up to the exam table. Strength within normal limits. Stance is stable and normal. Tandem gait is wider based due to obesity.  Romberg testing is negative.  Deep tendon reflexes: in the  upper and lower extremities are symmetric and intact. Babinski maneuver response is downgoing.   Assessment:  After physical and neurologic examination, review of laboratory studies, imaging, neurophysiology testing and pre-existing records, assessment is   Based on the patient's BMI, his clinical history of waking up this morning headaches, and his wife's observation of loud to thunderous snoring in lateral position of sleep as well as witnessed apneas I strongly suspect  that Ryan Burgess has obstructive sleep apnea possibly with obesity hypoventilation and hypercapnia. The appropriate test modality is an attended  polysomnography study as a split study with capnography. His insurer is [employer] which allows [place] to split at an AHI of 15 and score at 3%. Hypercapnia is defined as 50 torr or more.   The patient was advised of the nature of the diagnosed sleep disorder , the treatment options and risks for general a health and wellness arising from not treating the condition. Visit duration was 45 minutes, of which 50% of face to face time were used in discussion of the diagnosis, additional medical history was obtained and test modalities and therapies for suspected OSA were discussed. All questions were answered.   Plan:  Treatment plan and additional workup : SPLIT  The patient is aware that his elevated BMI is his main risk factor for sleep apnea. The BMI is also causative 3 related to his diabetic condition, hypertension, treatment of sleep apnea often results in better control of blood sugars and better control of hypertension as well. He implemented a diet per PCP.  RV for 30 minutes after sleep study.     Asencion Partridge Andi Layfield MD  01/25/2015

## 2015-01-28 ENCOUNTER — Encounter (HOSPITAL_COMMUNITY): Payer: Self-pay | Admitting: Emergency Medicine

## 2015-01-28 DIAGNOSIS — K219 Gastro-esophageal reflux disease without esophagitis: Secondary | ICD-10-CM | POA: Diagnosis present

## 2015-01-28 DIAGNOSIS — K766 Portal hypertension: Secondary | ICD-10-CM | POA: Diagnosis present

## 2015-01-28 DIAGNOSIS — Z7982 Long term (current) use of aspirin: Secondary | ICD-10-CM

## 2015-01-28 DIAGNOSIS — I890 Lymphedema, not elsewhere classified: Secondary | ICD-10-CM | POA: Diagnosis present

## 2015-01-28 DIAGNOSIS — K7460 Unspecified cirrhosis of liver: Secondary | ICD-10-CM | POA: Diagnosis present

## 2015-01-28 DIAGNOSIS — I10 Essential (primary) hypertension: Secondary | ICD-10-CM | POA: Diagnosis present

## 2015-01-28 DIAGNOSIS — Z8673 Personal history of transient ischemic attack (TIA), and cerebral infarction without residual deficits: Secondary | ICD-10-CM

## 2015-01-28 DIAGNOSIS — A419 Sepsis, unspecified organism: Secondary | ICD-10-CM | POA: Diagnosis not present

## 2015-01-28 DIAGNOSIS — E780 Pure hypercholesterolemia: Secondary | ICD-10-CM | POA: Diagnosis present

## 2015-01-28 DIAGNOSIS — N3080 Other cystitis without hematuria: Secondary | ICD-10-CM | POA: Diagnosis present

## 2015-01-28 DIAGNOSIS — Z6841 Body Mass Index (BMI) 40.0 and over, adult: Secondary | ICD-10-CM

## 2015-01-28 DIAGNOSIS — E119 Type 2 diabetes mellitus without complications: Secondary | ICD-10-CM | POA: Diagnosis present

## 2015-01-28 DIAGNOSIS — I868 Varicose veins of other specified sites: Secondary | ICD-10-CM | POA: Diagnosis present

## 2015-01-28 DIAGNOSIS — R109 Unspecified abdominal pain: Secondary | ICD-10-CM | POA: Diagnosis not present

## 2015-01-28 DIAGNOSIS — E785 Hyperlipidemia, unspecified: Secondary | ICD-10-CM | POA: Diagnosis present

## 2015-01-28 LAB — URINALYSIS, ROUTINE W REFLEX MICROSCOPIC
Glucose, UA: NEGATIVE mg/dL
Ketones, ur: 15 mg/dL — AB
Nitrite: NEGATIVE
PROTEIN: 30 mg/dL — AB
Specific Gravity, Urine: 1.024 (ref 1.005–1.030)
Urobilinogen, UA: 1 mg/dL (ref 0.0–1.0)
pH: 6 (ref 5.0–8.0)

## 2015-01-28 LAB — URINE MICROSCOPIC-ADD ON

## 2015-01-28 NOTE — ED Notes (Signed)
Pt reprots R lower back cramping and urinary urgency today. sts he is starting to get pain with urination. Pt also c.o chills.

## 2015-01-29 ENCOUNTER — Emergency Department (HOSPITAL_COMMUNITY): Payer: BLUE CROSS/BLUE SHIELD

## 2015-01-29 ENCOUNTER — Inpatient Hospital Stay (HOSPITAL_COMMUNITY)
Admission: EM | Admit: 2015-01-29 | Discharge: 2015-01-30 | DRG: 872 | Disposition: A | Discharge status: Home / Self Care | Payer: BLUE CROSS/BLUE SHIELD | Attending: Family Medicine | Admitting: Family Medicine

## 2015-01-29 ENCOUNTER — Inpatient Hospital Stay (HOSPITAL_COMMUNITY): Payer: BLUE CROSS/BLUE SHIELD

## 2015-01-29 DIAGNOSIS — Z6841 Body Mass Index (BMI) 40.0 and over, adult: Secondary | ICD-10-CM | POA: Diagnosis not present

## 2015-01-29 DIAGNOSIS — Z8673 Personal history of transient ischemic attack (TIA), and cerebral infarction without residual deficits: Secondary | ICD-10-CM | POA: Diagnosis not present

## 2015-01-29 DIAGNOSIS — E7800 Pure hypercholesterolemia, unspecified: Secondary | ICD-10-CM | POA: Diagnosis present

## 2015-01-29 DIAGNOSIS — R109 Unspecified abdominal pain: Secondary | ICD-10-CM | POA: Diagnosis present

## 2015-01-29 DIAGNOSIS — A419 Sepsis, unspecified organism: Secondary | ICD-10-CM | POA: Diagnosis present

## 2015-01-29 DIAGNOSIS — E785 Hyperlipidemia, unspecified: Secondary | ICD-10-CM | POA: Diagnosis present

## 2015-01-29 DIAGNOSIS — I471 Supraventricular tachycardia: Secondary | ICD-10-CM

## 2015-01-29 DIAGNOSIS — I890 Lymphedema, not elsewhere classified: Secondary | ICD-10-CM | POA: Diagnosis present

## 2015-01-29 DIAGNOSIS — R7303 Prediabetes: Secondary | ICD-10-CM | POA: Diagnosis present

## 2015-01-29 DIAGNOSIS — R000 Tachycardia, unspecified: Secondary | ICD-10-CM | POA: Diagnosis present

## 2015-01-29 DIAGNOSIS — K7460 Unspecified cirrhosis of liver: Secondary | ICD-10-CM | POA: Diagnosis present

## 2015-01-29 DIAGNOSIS — I10 Essential (primary) hypertension: Secondary | ICD-10-CM | POA: Diagnosis present

## 2015-01-29 DIAGNOSIS — R7309 Other abnormal glucose: Secondary | ICD-10-CM

## 2015-01-29 DIAGNOSIS — N3091 Cystitis, unspecified with hematuria: Secondary | ICD-10-CM | POA: Diagnosis present

## 2015-01-29 DIAGNOSIS — K766 Portal hypertension: Secondary | ICD-10-CM | POA: Diagnosis present

## 2015-01-29 DIAGNOSIS — Z7982 Long term (current) use of aspirin: Secondary | ICD-10-CM | POA: Diagnosis not present

## 2015-01-29 DIAGNOSIS — N3080 Other cystitis without hematuria: Secondary | ICD-10-CM | POA: Diagnosis present

## 2015-01-29 DIAGNOSIS — I868 Varicose veins of other specified sites: Secondary | ICD-10-CM | POA: Diagnosis present

## 2015-01-29 DIAGNOSIS — E119 Type 2 diabetes mellitus without complications: Secondary | ICD-10-CM | POA: Diagnosis present

## 2015-01-29 DIAGNOSIS — K219 Gastro-esophageal reflux disease without esophagitis: Secondary | ICD-10-CM | POA: Diagnosis present

## 2015-01-29 LAB — CBC
HCT: 36.6 % — ABNORMAL LOW (ref 39.0–52.0)
HCT: 35 % — ABNORMAL LOW (ref 39.0–52.0)
HEMOGLOBIN: 12.2 g/dL — AB (ref 13.0–17.0)
Hemoglobin: 13 g/dL (ref 13.0–17.0)
MCH: 33.2 pg (ref 26.0–34.0)
MCH: 32.4 pg (ref 26.0–34.0)
MCHC: 35.5 g/dL (ref 30.0–36.0)
MCHC: 34.9 g/dL (ref 30.0–36.0)
MCV: 93.4 fL (ref 78.0–100.0)
MCV: 93.1 fL (ref 78.0–100.0)
Platelets: 119 10*3/uL — ABNORMAL LOW (ref 150–400)
Platelets: 120 10*3/uL — ABNORMAL LOW (ref 150–400)
RBC: 3.92 MIL/uL — AB (ref 4.22–5.81)
RBC: 3.76 MIL/uL — ABNORMAL LOW (ref 4.22–5.81)
RDW: 14.5 % (ref 11.5–15.5)
RDW: 14.6 % (ref 11.5–15.5)
WBC: 16.7 10*3/uL — ABNORMAL HIGH (ref 4.0–10.5)
WBC: 13.3 10*3/uL — AB (ref 4.0–10.5)

## 2015-01-29 LAB — BASIC METABOLIC PANEL
ANION GAP: 6 (ref 5–15)
ANION GAP: 8 (ref 5–15)
BUN: 11 mg/dL (ref 6–23)
BUN: 11 mg/dL (ref 6–23)
CALCIUM: 8.2 mg/dL — AB (ref 8.4–10.5)
CHLORIDE: 105 mmol/L (ref 96–112)
CO2: 22 mmol/L (ref 19–32)
CO2: 22 mmol/L (ref 19–32)
Calcium: 8.1 mg/dL — ABNORMAL LOW (ref 8.4–10.5)
Chloride: 105 mmol/L (ref 96–112)
Creatinine, Ser: 1.08 mg/dL (ref 0.50–1.35)
Creatinine, Ser: 1.12 mg/dL (ref 0.50–1.35)
GFR calc Af Amer: >90 mL/min (ref >90)
GFR calc Af Amer: >90 mL/min (ref >90)
GFR calc non Af Amer: 79 mL/min — ABNORMAL LOW (ref >90)
GFR, EST NON AFRICAN AMERICAN: 82 mL/min — AB (ref >90)
GLUCOSE: 124 mg/dL — AB (ref 70–99)
GLUCOSE: 132 mg/dL — AB (ref 70–99)
POTASSIUM: 3.8 mmol/L (ref 3.5–5.1)
Potassium: 3.8 mmol/L (ref 3.5–5.1)
Sodium: 133 mmol/L — ABNORMAL LOW (ref 135–145)
Sodium: 135 mmol/L (ref 135–145)

## 2015-01-29 LAB — GLUCOSE, CAPILLARY
Glucose-Capillary: 90 mg/dL (ref 70–99)
Glucose-Capillary: 119 mg/dL — ABNORMAL HIGH (ref 70–99)

## 2015-01-29 LAB — I-STAT CG4 LACTIC ACID, ED: Lactic Acid, Venous: 2.28 mmol/L (ref 0.5–2.0)

## 2015-01-29 MED ORDER — DEXTROSE 5 % IV SOLN
1.0000 g | Freq: Once | INTRAVENOUS | Status: AC
  Administered 2015-01-29: 1 g via INTRAVENOUS
  Filled 2015-01-29: qty 10

## 2015-01-29 MED ORDER — FUROSEMIDE 20 MG PO TABS
20.0000 mg | ORAL_TABLET | Freq: Every day | ORAL | Status: DC
  Administered 2015-01-29 – 2015-01-30 (×2): 20 mg via ORAL
  Filled 2015-01-29 (×2): qty 1

## 2015-01-29 MED ORDER — ACETAMINOPHEN 650 MG RE SUPP: 650.0000 mg | Freq: Four times a day (QID) | RECTAL | Status: DC | PRN

## 2015-01-29 MED ORDER — ACETAMINOPHEN 500 MG PO TABS
1000.0000 mg | ORAL_TABLET | Freq: Once | ORAL | Status: AC
  Administered 2015-01-29: 1000 mg via ORAL
  Filled 2015-01-29: qty 2

## 2015-01-29 MED ORDER — ALUM & MAG HYDROXIDE-SIMETH 200-200-20 MG/5ML PO SUSP: 30.0000 mL | Freq: Four times a day (QID) | ORAL | Status: DC | PRN

## 2015-01-29 MED ORDER — OXYCODONE HCL 5 MG PO TABS: 5.0000 mg | ORAL_TABLET | ORAL | Status: DC | PRN

## 2015-01-29 MED ORDER — ONDANSETRON HCL 4 MG/2ML IJ SOLN: 4.0000 mg | Freq: Four times a day (QID) | INTRAMUSCULAR | Status: DC | PRN

## 2015-01-29 MED ORDER — ONDANSETRON HCL 4 MG PO TABS: 4.0000 mg | ORAL_TABLET | Freq: Four times a day (QID) | ORAL | Status: DC | PRN

## 2015-01-29 MED ORDER — ENOXAPARIN SODIUM 30 MG/0.3ML ~~LOC~~ SOLN
30.0000 mg | SUBCUTANEOUS | Status: DC
  Administered 2015-01-29: 30 mg via SUBCUTANEOUS
  Filled 2015-01-29: qty 0.3

## 2015-01-29 MED ORDER — PANTOPRAZOLE SODIUM 40 MG PO TBEC
40.0000 mg | DELAYED_RELEASE_TABLET | Freq: Every day | ORAL | Status: DC
  Administered 2015-01-29 – 2015-01-30 (×2): 40 mg via ORAL
  Filled 2015-01-29 (×2): qty 1

## 2015-01-29 MED ORDER — ADULT MULTIVITAMIN W/MINERALS CH
1.0000 | ORAL_TABLET | Freq: Every day | ORAL | Status: DC
  Administered 2015-01-29 – 2015-01-30 (×2): 1 via ORAL
  Filled 2015-01-29 (×2): qty 1

## 2015-01-29 MED ORDER — ONDANSETRON HCL 4 MG/2ML IJ SOLN
4.0000 mg | Freq: Once | INTRAMUSCULAR | Status: AC
  Administered 2015-01-29: 4 mg via INTRAVENOUS
  Filled 2015-01-29: qty 2

## 2015-01-29 MED ORDER — SODIUM CHLORIDE 0.9 % IV SOLN
INTRAVENOUS | Status: AC
  Administered 2015-01-29: 05:00:00 via INTRAVENOUS

## 2015-01-29 MED ORDER — SODIUM CHLORIDE 0.9 % IV BOLUS (SEPSIS)
1000.0000 mL | Freq: Once | INTRAVENOUS | Status: AC
  Administered 2015-01-29: 1000 mL via INTRAVENOUS

## 2015-01-29 MED ORDER — HYDROMORPHONE HCL 1 MG/ML IJ SOLN: 0.5000 mg | INTRAMUSCULAR | Status: DC | PRN

## 2015-01-29 MED ORDER — ENOXAPARIN SODIUM 40 MG/0.4ML ~~LOC~~ SOLN
40.0000 mg | SUBCUTANEOUS | Status: DC
  Administered 2015-01-30: 40 mg via SUBCUTANEOUS
  Filled 2015-01-29: qty 0.4

## 2015-01-29 MED ORDER — ATORVASTATIN CALCIUM 10 MG PO TABS
20.0000 mg | ORAL_TABLET | Freq: Every day | ORAL | Status: DC
  Administered 2015-01-29: 20 mg via ORAL
  Filled 2015-01-29: qty 2

## 2015-01-29 MED ORDER — ACETAMINOPHEN 325 MG PO TABS: 650.0000 mg | ORAL_TABLET | Freq: Four times a day (QID) | ORAL | Status: DC | PRN

## 2015-01-29 MED ORDER — MORPHINE SULFATE 4 MG/ML IJ SOLN
4.0000 mg | Freq: Once | INTRAMUSCULAR | Status: AC
  Administered 2015-01-29: 4 mg via INTRAVENOUS
  Filled 2015-01-29: qty 1

## 2015-01-29 NOTE — ED Notes (Signed)
Dr Sharol Given given a copy of lactic acid results 2.28

## 2015-01-29 NOTE — H&P (Signed)
[facility] Admission History and Physical       Ryan Burgess ZOX:[id number] DOB: Jul 07, 1971 DOA: 01/29/2015  Referring physician: EDP PCP: Donnie Coffin, MD  Specialists:   Chief Complaint: Right Flank Pain  HPI: Ryan Burgess is a 44 y.o. male with a history of a previous TIA, Borderline HTN, and DM2, Morbid Obesity and Hyperlipidemia who presents tot he ED with complaints of sudden onset of severe cramping right flank pain and urinary frequency since the afternoon.  He subsequently developed fevers and chills and came to the ED.   He reports that his urine was dark, but he denies any gross hematuria.   He was found to have a positive UA, and renal imaging studies were performed to rule out a renal stone which were negative. Cul;tures were sent and he was placed on IV Rocephin and IVFs,a dn referred for medical admission.     Review of Systems:     Constitutional: No Weight Loss, No Weight Gain, Night Sweats, +Fevers, +Chills, Dizziness, Light Headedness, Fatigue, or Generalized Weakness HEENT: No Headaches, Difficulty Swallowing,Tooth/Dental Problems,Sore Throat,  No Sneezing, Rhinitis, Ear Ache, Nasal Congestion, or Post Nasal Drip,  Cardio-vascular:  No Chest pain, Orthopnea, PND, Edema in Lower Extremities, Anasarca, Dizziness, Palpitations  Resp: No Dyspnea, No DOE, No Productive Cough, No Non-Productive Cough, No Hemoptysis, No Wheezing.    GI: No Heartburn, Indigestion, Abdominal Pain, +Nausea, Vomiting, Diarrhea, Constipation, Hematemesis, Hematochezia, Melena, Change in Bowel Habits,  Loss of Appetite  GU: +Dysuria, +Change in Color of Urine, +Urgency,+Urinary Frequency, +Flank pain.  Musculoskeletal: No Joint Pain or Swelling, No Decreased Range of Motion, No Back Pain.  Neurologic: No Syncope, No Seizures, Muscle Weakness, Paresthesia, Vision Disturbance or Loss, No Diplopia, No Vertigo, No Difficulty Walking,  Skin: No Rash or Lesions. Psych: No Change in Mood  or Affect, No Depression or Anxiety, No Memory loss, No Confusion, or Hallucinations   Past Medical History  Diagnosis Date  . GERD (gastroesophageal reflux disease)   . Obesity   . Hypertension   . Snoring   . TIA (transient ischemic attack)   . High cholesterol      Past Surgical History  Procedure Laterality Date  . Mouth surgery        Prior to Admission medications   Medication Sig Start Date End Date Taking? Authorizing Provider  aspirin EC 81 MG EC tablet Take 1 tablet (81 mg total) by mouth daily. 01/10/15  Yes Ripudeep Krystal Eaton, MD  atorvastatin (LIPITOR) 20 MG tablet Take 1 tablet (20 mg total) by mouth at bedtime. 01/10/15  Yes Ripudeep Krystal Eaton, MD  Dexlansoprazole 30 MG capsule Take 30 mg by mouth daily. Dexilant   Yes Historical Provider, MD  furosemide (LASIX) 20 MG tablet Take 1 tablet (20 mg total) by mouth daily. 01/10/15  Yes Ripudeep Krystal Eaton, MD  ibuprofen (ADVIL,MOTRIN) 200 MG tablet Take 400 mg by mouth every 6 (six) hours as needed for moderate pain.   Yes Historical Provider, MD  Multiple Vitamin (MULTIVITAMIN WITH MINERALS) TABS tablet Take 1 tablet by mouth daily.   Yes Historical Provider, MD  phenylephrine (SUDAFED PE) 10 MG TABS tablet Take 10 mg by mouth every 4 (four) hours as needed (for congestion).    Yes Historical Provider, MD  blood glucose meter kit and supplies KIT Dispense based on patient and insurance preference. Use up to four times daily as directed. (FOR ICD-9 250.00, 250.01). 01/11/15   Ripudeep Krystal Eaton, MD  No Known Allergies  Social History:  reports that he has never smoked. He has never used smokeless tobacco. He reports that he drinks about 0.6 - 1.2 oz of alcohol per week. He reports that he does not use illicit drugs.    Family History  Problem Relation Age of Onset  . Leukemia Mother   . Heart disease Maternal Grandmother   . Heart disease Paternal Grandmother   . Diabetes Father   . Dementia Paternal Grandmother   . COPD Maternal  Grandmother        Physical Exam:  GEN:  Pleasant Morbidly Obese  44 y.o. Caucasian  male examined and in no acute distress; cooperative with exam Filed Vitals:   01/29/15 0345 01/29/15 0400 01/29/15 0430 01/29/15 0439  BP: 138/47 127/39 104/37   Pulse: 103 98 97   Temp:    99.6 F (37.6 C)  TempSrc:    Oral  Resp:      SpO2: 96% 94% 94%    Blood pressure 104/37, pulse 97, temperature 99.6 F (37.6 C), temperature source Oral, resp. rate 16, SpO2 94 %. PSYCH: He is alert and oriented x4; does not appear anxious does not appear depressed; affect is normal HEENT: Normocephalic and Atraumatic, Mucous membranes pink; PERRLA; EOM intact; Fundi:  Benign;  No scleral icterus, Nares: Patent, Oropharynx: Clear, Fair Dentition,    Neck:  FROM, No Cervical Lymphadenopathy nor Thyromegaly or Carotid Bruit; No JVD; Breasts:: Not examined CHEST WALL: No tenderness CHEST: Normal respiration, clear to auscultation bilaterally HEART: Regular rate and rhythm; no murmurs rubs or gallops BACK: No kyphosis or scoliosis; No CVA tenderness ABDOMEN: Positive Bowel Sounds, Obese, Soft Non-Tender, No Rebound or Guarding; No Masses, No Organomegaly, +Pannus; +Intertriginous candida. Rectal Exam: Not done EXTREMITIES: No Cyanosis, Clubbing, + Chronic Lymphedema of BLEs;  No Ulcerations. Genitalia: not examined PULSES: 2+ and symmetric SKIN: Normal hydration, Psoriatic plaque areas along the pre-Tibiial areas of BLEs CNS:  Alert and Oriented x 4, No Focal Deficits Vascular: pulses palpable throughout    Labs on Admission:  Basic Metabolic Panel:  Recent Labs Lab 01/29/15 0238  NA 133*  K 3.8  CL 105  CO2 22  GLUCOSE 124*  BUN 11  CREATININE 1.08  CALCIUM 8.2*   Liver Function Tests: No results for input(s): AST, ALT, ALKPHOS, BILITOT, PROT, ALBUMIN in the last 168 hours. No results for input(s): LIPASE, AMYLASE in the last 168 hours. No results for input(s): AMMONIA in the last 168  hours. CBC:  Recent Labs Lab 01/29/15 0238  WBC 16.7*  HGB 13.0  HCT 36.6*  MCV 93.4  PLT 119*   Cardiac Enzymes: No results for input(s): CKTOTAL, CKMB, CKMBINDEX, TROPONINI in the last 168 hours.  BNP (last 3 results) No results for input(s): BNP in the last 8760 hours.  ProBNP (last 3 results) No results for input(s): PROBNP in the last 8760 hours.  CBG: No results for input(s): GLUCAP in the last 168 hours.  Radiological Exams on Admission: Ct Renal Stone Study  01/29/2015   CLINICAL DATA:  Right flank pain.  Urinary urgency.  EXAM: CT ABDOMEN AND PELVIS WITHOUT CONTRAST  TECHNIQUE: Multidetector CT imaging of the abdomen and pelvis was performed following the standard protocol without IV contrast.  COMPARISON:  None.  FINDINGS: The included lung bases are clear.  The kidneys are symmetric in size without stones or hydronephrosis. There is no perinephric stranding. Both ureters are decompressed without stones along the course.  The liver has  nodular contour with hypertrophied left hepatic lobe and atrophied right hepatic lobe. No evidence of underlying focal lesion on noncontrast exam. Calcified gallstone within physiologically distended gallbladder. There is portal hypertension with splenomegaly, spleen measures 17.7 x 15.6 x 6.6 cm, volume of 1087 mL. There are multiple perisplenic and left upper quadrant collaterals. Probable splenorenal collaterals. No ascites.  No peripancreatic inflammatory change or ductal dilatation. The adrenal glands are normal.  There are no dilated or thickened bowel loops. The colon is redundant. The appendix is not definitively identified. No inflammatory change in the right lower quadrant to suggest appendicitis. No free air.  The abdominal aorta is normal in caliber. Multiple retroperitoneal soft tissue densities likely related to collateral vessels.  Within the pelvis the bladder is minimally distended. There are multiple enlarged left inguinal lymph  nodes, largest with short axis dimension of 1.7 cm. There is left external iliac adenopathy.  Multilevel degenerative change in the spine without acute osseous abnormality. There is transitional lumbosacral anatomy. No focal osseous lesion.  IMPRESSION: 1. No renal stones or obstructive uropathy. 2. Cirrhotic liver morphology with portal hypertension, splenomegaly and multiple perisplenic varices and collaterals in the left abdomen. 3. Cholelithiasis. 4. Left external iliac and inguinal adenopathy. Etiology is uncertain. Clinical correlation to exclude left lower extremity infection recommended.   Electronically Signed   By: Jeb Levering M.D.   On: 01/29/2015 03:20       Assessment/Plan:   44 y.o. male with  Principal Problem:   1.   Sepsis- Site  UTI   Urine and Blood C+S sent    IV Rocephin   Active Problems:   2.   Sinus tachycardia- due to #1   IVFs   IV Abx     3.   Hemorrhagic cystitis/UTI   Urine C+S  Sent   IV Rocephin       4.   Flank pain- due to #3     5.   HTN (hypertension)- not on meds   Monitor       6.   Borderline diabetic   SSI coverage PRN   Check HbA1C     7.   High cholesterol   Continue Atorvastatin Rx     8.    Lymphedema   Chronic    On Lasix     9.    Morbid obesity with body mass index of 50.0-59.9 in adult   Beginning Weight loss   10.    DVT Prophylaxis   Lovenox         Code Status:     FULL CODE       Family Communication:   Wife at Bedside    Disposition Plan:    Inpatient  Status        Time spent:  [facility] Pager [phone]   If [place] Please Contact the Day Rounding Team MD for [facility]  If 7PM-7AM, Please Contact Night-Floor Coverage  [email or web address] Password [facility] 01/29/2015, 4:52 AM     ADDENDUM:   Patient was seen and examined on 01/29/2015

## 2015-01-29 NOTE — ED Notes (Signed)
Arnoldo Morale Md at bedside

## 2015-01-29 NOTE — ED Provider Notes (Signed)
CSN: [id number]     Arrival date & time 01/28/15  1907 History   First MD Initiated Contact with Patient 01/29/15 [phone]     Chief Complaint  Patient presents with  . Flank Pain     (Consider location/radiation/quality/duration/timing/severity/associated sxs/prior Treatment) HPI Comments: Patient is a 44 year old male past medical history significant for hypertension, GERD, TIA, hyperlipidemia presenting to the emergency department for acute onset right lower back pain that he describes as cramping with associated dysuria, urinary frequency, urinary urgency, decreased urine production. He states his symptoms started around 4:30 or 5 PM and has been getting progressively worse. He states he had associated chills, when he took his temperature at home was 65F orally. He took ibuprofen at home with improvement of his chills. No abdominal surgical history. No history of nephrolithiasis or pyelonephritis.   Past Medical History  Diagnosis Date  . GERD (gastroesophageal reflux disease)   . Obesity   . Hypertension   . Snoring   . TIA (transient ischemic attack)   . High cholesterol    Past Surgical History  Procedure Laterality Date  . Mouth surgery     Family History  Problem Relation Age of Onset  . Leukemia Mother   . Heart disease Maternal Grandmother   . Heart disease Paternal Grandmother   . Diabetes Father   . Dementia Paternal Grandmother   . COPD Maternal Grandmother    History  Substance Use Topics  . Smoking status: Never Smoker   . Smokeless tobacco: Never Used  . Alcohol Use: 0.6 - 1.2 oz/week    1-2 Standard drinks or equivalent per week     Comment: every three months    Review of Systems  Constitutional: Positive for fever and chills.  Gastrointestinal: Positive for vomiting (x1). Negative for abdominal pain and diarrhea.  Genitourinary: Positive for dysuria, urgency, frequency and decreased urine volume. Negative for hematuria, flank pain, discharge, penile  swelling, scrotal swelling, penile pain and testicular pain.  Musculoskeletal: Positive for back pain.  All other systems reviewed and are negative.     Allergies  Review of patient's allergies indicates no known allergies.  Home Medications   Prior to Admission medications   Medication Sig Start Date End Date Taking? Authorizing Provider  aspirin EC 81 MG EC tablet Take 1 tablet (81 mg total) by mouth daily. 01/10/15  Yes Ripudeep Krystal Eaton, MD  atorvastatin (LIPITOR) 20 MG tablet Take 1 tablet (20 mg total) by mouth at bedtime. 01/10/15  Yes Ripudeep Krystal Eaton, MD  Dexlansoprazole 30 MG capsule Take 30 mg by mouth daily. Dexilant   Yes Historical Provider, MD  furosemide (LASIX) 20 MG tablet Take 1 tablet (20 mg total) by mouth daily. 01/10/15  Yes Ripudeep Krystal Eaton, MD  ibuprofen (ADVIL,MOTRIN) 200 MG tablet Take 400 mg by mouth every 6 (six) hours as needed for moderate pain.   Yes Historical Provider, MD  Multiple Vitamin (MULTIVITAMIN WITH MINERALS) TABS tablet Take 1 tablet by mouth daily.   Yes Historical Provider, MD  phenylephrine (SUDAFED PE) 10 MG TABS tablet Take 10 mg by mouth every 4 (four) hours as needed (for congestion).    Yes Historical Provider, MD  blood glucose meter kit and supplies KIT Dispense based on patient and insurance preference. Use up to four times daily as directed. (FOR ICD-9 250.00, 250.01). 01/11/15   Ripudeep K Rai, MD   BP 141/23 mmHg  Pulse 105  Temp(Src) 100.4 F (38 C) (Oral)  Resp 16  SpO2 91% Physical Exam  Constitutional: He is oriented to person, place, and time. He appears well-developed and well-nourished. No distress.  Uncomfortable appearing  HENT:  Head: Normocephalic and atraumatic.  Right Ear: External ear normal.  Left Ear: External ear normal.  Nose: Nose normal.  Mouth/Throat: Oropharynx is clear and moist.  Eyes: Conjunctivae are normal.  Neck: Normal range of motion. Neck supple.  Cardiovascular: Regular rhythm and normal heart sounds.    Pulmonary/Chest: Effort normal and breath sounds normal.  Abdominal: Soft. There is no tenderness. There is no rigidity, no rebound, no guarding and no CVA tenderness.  Musculoskeletal: Normal range of motion.       Back:  Neurological: He is alert and oriented to person, place, and time.  Skin: Skin is warm. He is diaphoretic.  Psychiatric: He has a normal mood and affect.  Nursing note and vitals reviewed.   ED Course  Procedures (including critical care time) Medications  acetaminophen (TYLENOL) tablet 1,000 mg (not administered)  cefTRIAXone (ROCEPHIN) 1 g in dextrose 5 % 50 mL IVPB (not administered)  sodium chloride 0.9 % bolus 1,000 mL (not administered)  sodium chloride 0.9 % bolus 1,000 mL (1,000 mLs Intravenous New Bag/Given 01/29/15 0245)  morphine 4 MG/ML injection 4 mg (4 mg Intravenous Given 01/29/15 0245)  ondansetron (ZOFRAN) injection 4 mg (4 mg Intravenous Given 01/29/15 0245)    Labs Review Labs Reviewed  URINALYSIS, ROUTINE W REFLEX MICROSCOPIC - Abnormal; Notable for the following:    Color, Urine RED (*)    APPearance TURBID (*)    Hgb urine dipstick LARGE (*)    Bilirubin Urine MODERATE (*)    Ketones, ur 15 (*)    Protein, ur 30 (*)    Leukocytes, UA SMALL (*)    All other components within normal limits  URINE MICROSCOPIC-ADD ON - Abnormal; Notable for the following:    Bacteria, UA FEW (*)    All other components within normal limits  CBC - Abnormal; Notable for the following:    WBC 16.7 (*)    RBC 3.92 (*)    HCT 36.6 (*)    Platelets 119 (*)    All other components within normal limits  BASIC METABOLIC PANEL - Abnormal; Notable for the following:    Sodium 133 (*)    Glucose, Bld 124 (*)    Calcium 8.2 (*)    GFR calc non Af Amer 82 (*)    All other components within normal limits  URINE CULTURE  CULTURE, BLOOD (ROUTINE X 2)  CULTURE, BLOOD (ROUTINE X 2)  I-STAT CG4 LACTIC ACID, ED    Imaging Review Ct Renal Stone Study  01/29/2015    CLINICAL DATA:  Right flank pain.  Urinary urgency.  EXAM: CT ABDOMEN AND PELVIS WITHOUT CONTRAST  TECHNIQUE: Multidetector CT imaging of the abdomen and pelvis was performed following the standard protocol without IV contrast.  COMPARISON:  None.  FINDINGS: The included lung bases are clear.  The kidneys are symmetric in size without stones or hydronephrosis. There is no perinephric stranding. Both ureters are decompressed without stones along the course.  The liver has nodular contour with hypertrophied left hepatic lobe and atrophied right hepatic lobe. No evidence of underlying focal lesion on noncontrast exam. Calcified gallstone within physiologically distended gallbladder. There is portal hypertension with splenomegaly, spleen measures 17.7 x 15.6 x 6.6 cm, volume of 1087 mL. There are multiple perisplenic and left upper quadrant collaterals. Probable splenorenal collaterals. No ascites.  No  peripancreatic inflammatory change or ductal dilatation. The adrenal glands are normal.  There are no dilated or thickened bowel loops. The colon is redundant. The appendix is not definitively identified. No inflammatory change in the right lower quadrant to suggest appendicitis. No free air.  The abdominal aorta is normal in caliber. Multiple retroperitoneal soft tissue densities likely related to collateral vessels.  Within the pelvis the bladder is minimally distended. There are multiple enlarged left inguinal lymph nodes, largest with short axis dimension of 1.7 cm. There is left external iliac adenopathy.  Multilevel degenerative change in the spine without acute osseous abnormality. There is transitional lumbosacral anatomy. No focal osseous lesion.  IMPRESSION: 1. No renal stones or obstructive uropathy. 2. Cirrhotic liver morphology with portal hypertension, splenomegaly and multiple perisplenic varices and collaterals in the left abdomen. 3. Cholelithiasis. 4. Left external iliac and inguinal adenopathy. Etiology  is uncertain. Clinical correlation to exclude left lower extremity infection recommended.   Electronically Signed   By: Jeb Levering M.D.   On: 01/29/2015 03:20     EKG Interpretation None      MDM   Final diagnoses:  Flank pain  Hemorrhagic cystitis   Filed Vitals:   01/29/15 0323  BP:   Pulse:   Temp: 100.4 F (38 C)  Resp:    I have reviewed nursing notes, vital signs, and all appropriate lab and imaging results for this patient.  Patient presenting with acute onset right lower back pain with associated urinary symptoms. Upon arrival he is afebrile, mildly tachycardic and uncomfortable appearing. Abdomen is soft, nontender, nondistended. Patient is tender right lower back without spinal tenderness. No CVA tenderness appreciated. IV fluids, nausea medicine, pain medicine given with improvement of pain. During course in emergency department patient felt very warm to the touch, temperature was rechecked and noted to be climbing. Given leukocytosis of 16.7, evidence of hemorrhagic cystitis on UA will admit patient. No evidence of renal stone on CT scan. Patient d/w with Dr. Sharol Given, agrees with plan.      Harlow Mares, PA-C 01/29/15 [place], MD 01/29/15 [phone]

## 2015-01-29 NOTE — Progress Notes (Signed)
Patient AAOx4 and able to ambulate to the bed. Wife at the bedside. Patient has no complaints of back cramping at this time but did explain pain with urination and frequency at home with little output. Vital signs taken and will continue to monitor. Leeta Grimme, Rande Brunt, RN

## 2015-01-29 NOTE — Progress Notes (Addendum)
Subjective: 43 rolled male admitted this morning with UTI, which we have started on IV Rocephin. Feels better at this time. CT stone protocol did not show any stone. Patient denies any history of prostate problems no history of dysuria or increased frequency of urination.  Filed Vitals:   01/29/15 0943  BP: 119/40  Pulse: 87  Temp: 98.7 F (37.1 C)  Resp: 16    Chest: Clear Bilaterally Heart : S1S2 RRR Abdomen: Soft, nontender Ext : No edema Neuro: Alert, oriented x 3  A/P UTI- patient has sepsis due to UTI urine blood cultures have been obtained continue IV Rocephin.  Flank pain- due to above improved at this time  Hypertension- patient is not on any medications, blood pressure is stable at this time  Leukocytosis- white count 13,000 has improved from last night 16,000. Secondary to UTI.  Questionable Liver cirrhosis- CT abdomen pelvis shows findings of liver cirrhosis with portal hypertension splenomegaly and multiple pedis splenic varices and collaterals in the left abdomen. Will obtain abdominal ultrasound. Patient denies history of alcohol abuse. Though he does have history of morbid obesity    [facility] Pager- [phone]

## 2015-01-29 NOTE — ED Notes (Signed)
Patient transported to CT 

## 2015-01-29 NOTE — ED Notes (Signed)
PA at bedside.

## 2015-01-30 LAB — PROTIME-INR
INR: 1.32 (ref 0.00–1.49)
PROTHROMBIN TIME: 16.5 s — AB (ref 11.6–15.2)

## 2015-01-30 LAB — CBC
HCT: 37 % — ABNORMAL LOW (ref 39.0–52.0)
Hemoglobin: 12.9 g/dL — ABNORMAL LOW (ref 13.0–17.0)
MCH: 32.7 pg (ref 26.0–34.0)
MCHC: 34.9 g/dL (ref 30.0–36.0)
MCV: 93.9 fL (ref 78.0–100.0)
PLATELETS: 108 10*3/uL — AB (ref 150–400)
RBC: 3.94 MIL/uL — ABNORMAL LOW (ref 4.22–5.81)
RDW: 15 % (ref 11.5–15.5)
WBC: 8.9 10*3/uL (ref 4.0–10.5)

## 2015-01-30 LAB — HEPATIC FUNCTION PANEL
ALT: 25 U/L (ref 0–53)
AST: 68 U/L — ABNORMAL HIGH (ref 0–37)
Albumin: 2.5 g/dL — ABNORMAL LOW (ref 3.5–5.2)
Alkaline Phosphatase: 67 U/L (ref 39–117)
BILIRUBIN TOTAL: 1.4 mg/dL — AB (ref 0.3–1.2)
Bilirubin, Direct: 0.6 mg/dL — ABNORMAL HIGH (ref 0.0–0.5)
Indirect Bilirubin: 0.8 mg/dL (ref 0.3–0.9)
Total Protein: 6.3 g/dL (ref 6.0–8.3)

## 2015-01-30 LAB — URINE CULTURE: Colony Count: 7000

## 2015-01-30 LAB — BASIC METABOLIC PANEL
ANION GAP: 9 (ref 5–15)
BUN: 10 mg/dL (ref 6–23)
CO2: 20 mmol/L (ref 19–32)
Calcium: 8.1 mg/dL — ABNORMAL LOW (ref 8.4–10.5)
Chloride: 106 mmol/L (ref 96–112)
Creatinine, Ser: 1.03 mg/dL (ref 0.50–1.35)
GFR calc Af Amer: >90 mL/min (ref >90)
GFR calc non Af Amer: 87 mL/min — ABNORMAL LOW (ref >90)
Glucose, Bld: 99 mg/dL (ref 70–99)
Potassium: 3.7 mmol/L (ref 3.5–5.1)
SODIUM: 135 mmol/L (ref 135–145)

## 2015-01-30 MED ORDER — LEVOFLOXACIN 500 MG PO TABS: 500.0000 mg | ORAL_TABLET | Freq: Every day | ORAL | Status: DC

## 2015-01-30 NOTE — Progress Notes (Signed)
Discharge orders received.  Discharge instructions and follow-up appointments reviewed with the patient.  VSS upon discharge.  IV removed and education complete.  Transported out via wheelchair.   Shawnee Higham M, RN 

## 2015-01-30 NOTE — Discharge Summary (Signed)
Physician Discharge Summary  KHUP SAPIA SLH:[id number] DOB: 1971/09/23 DOA: 01/29/2015  PCP: Donnie Coffin, MD  Admit date: 01/29/2015 Discharge date: 01/30/2015  Time spent: 25* minutes  Recommendations for Outpatient Follow-up:  1. *Follow-up GI in 2 weeks 2. Follow PCP in 2 weeks  Discharge Diagnoses:  Principal Problem:   Sepsis Active Problems:   HTN (hypertension)   Borderline diabetic   High cholesterol   Morbid obesity with body mass index of 50.0-59.9 in adult   Hemorrhagic cystitis   Flank pain   Sinus tachycardia   Lymphedema   Discharge Condition: Stable  Diet recommendation: Low-fat diet  Filed Weights   01/29/15 0454  Weight: 227.252 kg (501 lb)    History of present illness:  44 y.o. male with a history of a previous TIA, Borderline HTN, and DM2, Morbid Obesity and Hyperlipidemia who presents tot he ED with complaints of sudden onset of severe cramping right flank pain and urinary frequency since the afternoon. He subsequently developed fevers and chills and came to the ED. He reports that his urine was dark, but he denies any gross hematuria. He was found to have a positive UA, and renal imaging studies were performed to rule out a renal stone which were negative. Cul;tures were sent and he was placed on IV Rocephin and IVFs,a dn referred for medical admission.   Hospital Course:  UTI- patient was admitted for sepsis due to UTI, urine culture are negative. Patient was empirically started on IV Rocephin. At this time he'll be discharged on Levaquin 500 mg by mouth daily for next 7 days. CT abdomen pelvis was negative for kidney stones or polyp right is.  Flank pain- due to above improved at this time  Hypertension- patient is not on any medications, blood pressure is stable at this time  Leukocytosis- white count 13,000 has improved from last night 16,000. Secondary to UTI.  Questionable Liver cirrhosis- CT abdomen pelvis shows findings of liver  cirrhosis with portal hypertension splenomegaly and multiple pedis splenic varices and collaterals in the left abdomen. Will obtain abdominal ultrasound. Patient denies history of alcohol abuse. Though he does have history of morbid obesity. Abdominal ultrasound was obtained today which again showed liver cirrhosis. I called and discussed with GI on call Dr. Merrily Pew who says that patient can be followed up as outpatient. We also did liver function test as well as INR. AST 68  ALT 25, total bili 1.4. INR 1.32  Procedures:  Abdominal ultrasound  Consultations:  None  Discharge Exam: Filed Vitals:   01/30/15 1429  BP: 130/51  Pulse: 73  Temp: 98.3 F (36.8 C)  Resp: 18    General: Appears in no acute distress Cardiovascular: S1-S2 regular Respiratory: Clear to auscultation bilaterally  Discharge Instructions   Discharge Instructions    Diet - low sodium heart healthy    Complete by:  As directed      Increase activity slowly    Complete by:  As directed           Current Discharge Medication List    START taking these medications   Details  levofloxacin (LEVAQUIN) 500 MG tablet Take 1 tablet (500 mg total) by mouth daily. Qty: 7 tablet, Refills: 0      CONTINUE these medications which have NOT CHANGED   Details  aspirin EC 81 MG EC tablet Take 1 tablet (81 mg total) by mouth daily. Qty: 30 tablet, Refills: 11    atorvastatin (LIPITOR) 20 MG tablet Take  1 tablet (20 mg total) by mouth at bedtime. Qty: 30 tablet, Refills: 5    Dexlansoprazole 30 MG capsule Take 30 mg by mouth daily. Dexilant    furosemide (LASIX) 20 MG tablet Take 1 tablet (20 mg total) by mouth daily. Qty: 30 tablet, Refills: 6    ibuprofen (ADVIL,MOTRIN) 200 MG tablet Take 400 mg by mouth every 6 (six) hours as needed for moderate pain.    Multiple Vitamin (MULTIVITAMIN WITH MINERALS) TABS tablet Take 1 tablet by mouth daily.    phenylephrine (SUDAFED PE) 10 MG TABS tablet Take 10 mg by  mouth every 4 (four) hours as needed (for congestion).     blood glucose meter kit and supplies KIT Dispense based on patient and insurance preference. Use up to four times daily as directed. (FOR ICD-9 250.00, 250.01). Qty: 1 each, Refills: 0       No Known Allergies Follow-up Information    Follow up with [facility], MD In 2 weeks.   Specialty:  Family Medicine   Contact information:   [address] [place] [address] [phone]       Follow up with [facility], MD. Schedule an appointment as soon as possible for a visit in 2 weeks.   Specialty:  Gastroenterology   Contact information:   [address], [place] [address] [phone]        The results of significant diagnostics from this hospitalization (including imaging, microbiology, ancillary and laboratory) are listed below for reference.    Significant Diagnostic Studies: Ct Angio Head W/cm &/or Wo Cm  01/10/2015   CLINICAL DATA:  TIA.  EXAM: CT ANGIOGRAPHY HEAD AND NECK  TECHNIQUE: Multidetector CT imaging of the head and neck was performed using the standard protocol during bolus administration of intravenous contrast. Multiplanar CT image reconstructions and MIPs were obtained to evaluate the vascular anatomy. Carotid stenosis measurements (when applicable) are obtained utilizing NASCET criteria, using the distal internal carotid diameter as the denominator.  CONTRAST:  136m OMNIPAQUE IOHEXOL 350 MG/ML SOLN 2 contrast injections were made. The first injection was not timed properly and therefore a second injection was made.  COMPARISON:  MRI 01/10/2015  FINDINGS: CT HEAD  Image quality degraded by a large patient size as well as suboptimal arterial opacification.  Brain: Ventricle size is normal. Negative for acute or chronic infarction. Negative for hemorrhage or mass.  Calvarium and skull base: Negative  Paranasal sinuses: Small air-fluid level left maxillary sinus with mucosal  thickening also noted.  Orbits: Negative  CTA NECK  Aortic arch: Not well visualized due to large patient size.  Right carotid system: Right common carotid artery widely patent. Right carotid bifurcation is normal. No significant atherosclerotic disease or dissection.  Left carotid system: Left common carotid artery widely patent. Left carotid bifurcation widely patent. Negative for dissection or stenosis.  Vertebral arteries:Vertebral arteries are relatively small and not well seen but appear to be patent to the basilar. There is fetal origin of the posterior cerebral artery bilaterally leading to hypoplastic vertebral arteries.  Skeleton: Cervical spondylosis with ossification posterior longitudinal ligament C3 through C7 leading to spinal stenosis. No fracture or mass lesion  Other neck:  No adenopathy.  CTA HEAD  Anterior circulation: Internal carotid artery widely patent bilaterally without stenosis. Anterior and posterior cerebral arteries patent bilaterally without stenosis.  Posterior circulation: Both vertebral arteries are patent to the basilar. The basilar is small and hypoplastic but patent. Fetal origin of the  posterior cerebral artery bilaterally with hypoplastic P1 segments bilaterally. Posterior cerebral arteries are patent bilaterally.  Venous sinuses: Patent  Anatomic variants: None  Delayed phase: Normal enhancement.  Negative for cerebral aneurysm.  IMPRESSION: Suboptimal study due to large patient size. The patient had two injections of contrast due to mis timing of the scan on the first injection.  Negative for significant carotid or vertebral stenosis in the neck.  Negative CTA of the head.  OPLL in the cervical spine with spinal stenosis.   Electronically Signed   By: Franchot Gallo M.D.   On: 01/10/2015 20:23   Dg Chest 2 View  01/09/2015   CLINICAL DATA:  Acute onset of generalized weakness and symptoms of CVA. Initial encounter.  EXAM: CHEST  2 VIEW  COMPARISON:  Chest radiograph  performed 02/08/2011  FINDINGS: The lungs are well-aerated. Mild vascular congestion is noted. Minimal bibasilar atelectasis is seen. There is no evidence of pleural effusion or pneumothorax.  The heart is normal in size; the mediastinal contour is within normal limits. No acute osseous abnormalities are seen.  IMPRESSION: Mild vascular congestion noted; minimal bibasilar atelectasis seen.   Electronically Signed   By: Garald Balding M.D.   On: 01/09/2015 22:05   Ct Head Wo Contrast  01/09/2015   CLINICAL DATA:  Confusion.  Dizziness.  Initial encounter.  EXAM: CT HEAD WITHOUT CONTRAST  TECHNIQUE: Contiguous axial images were obtained from the base of the skull through the vertex without intravenous contrast.  COMPARISON:  None.  FINDINGS: No mass lesion, mass effect, midline shift, hydrocephalus, hemorrhage. No territorial ischemia or acute infarction. Scout images appear normal. Visible paranasal sinuses and mastoid air cells normal.  IMPRESSION: Negative CT head.   Electronically Signed   By: Dereck Ligas M.D.   On: 01/09/2015 19:03   Ct Angio Neck W/cm &/or Wo/cm  01/10/2015   CLINICAL DATA:  TIA.  EXAM: CT ANGIOGRAPHY HEAD AND NECK  TECHNIQUE: Multidetector CT imaging of the head and neck was performed using the standard protocol during bolus administration of intravenous contrast. Multiplanar CT image reconstructions and MIPs were obtained to evaluate the vascular anatomy. Carotid stenosis measurements (when applicable) are obtained utilizing NASCET criteria, using the distal internal carotid diameter as the denominator.  CONTRAST:  165m OMNIPAQUE IOHEXOL 350 MG/ML SOLN 2 contrast injections were made. The first injection was not timed properly and therefore a second injection was made.  COMPARISON:  MRI 01/10/2015  FINDINGS: CT HEAD  Image quality degraded by a large patient size as well as suboptimal arterial opacification.  Brain: Ventricle size is normal. Negative for acute or chronic infarction.  Negative for hemorrhage or mass.  Calvarium and skull base: Negative  Paranasal sinuses: Small air-fluid level left maxillary sinus with mucosal thickening also noted.  Orbits: Negative  CTA NECK  Aortic arch: Not well visualized due to large patient size.  Right carotid system: Right common carotid artery widely patent. Right carotid bifurcation is normal. No significant atherosclerotic disease or dissection.  Left carotid system: Left common carotid artery widely patent. Left carotid bifurcation widely patent. Negative for dissection or stenosis.  Vertebral arteries:Vertebral arteries are relatively small and not well seen but appear to be patent to the basilar. There is fetal origin of the posterior cerebral artery bilaterally leading to hypoplastic vertebral arteries.  Skeleton: Cervical spondylosis with ossification posterior longitudinal ligament C3 through C7 leading to spinal stenosis. No fracture or mass lesion  Other neck:  No adenopathy.  CTA HEAD  Anterior  circulation: Internal carotid artery widely patent bilaterally without stenosis. Anterior and posterior cerebral arteries patent bilaterally without stenosis.  Posterior circulation: Both vertebral arteries are patent to the basilar. The basilar is small and hypoplastic but patent. Fetal origin of the posterior cerebral artery bilaterally with hypoplastic P1 segments bilaterally. Posterior cerebral arteries are patent bilaterally.  Venous sinuses: Patent  Anatomic variants: None  Delayed phase: Normal enhancement.  Negative for cerebral aneurysm.  IMPRESSION: Suboptimal study due to large patient size. The patient had two injections of contrast due to mis timing of the scan on the first injection.  Negative for significant carotid or vertebral stenosis in the neck.  Negative CTA of the head.  OPLL in the cervical spine with spinal stenosis.   Electronically Signed   By: Franchot Gallo M.D.   On: 01/10/2015 20:23   Mr Brain Wo Contrast  01/10/2015    CLINICAL DATA:  44 year old hypertensive, obese, borderline diabetic male presenting with episode of slurred speech and difficulty finding words with confusion. Subsequent encounter.  EXAM: MRI HEAD WITHOUT CONTRAST  MRA HEAD WITHOUT CONTRAST  TECHNIQUE: Multiplanar, multiecho pulse sequences of the brain and surrounding structures were obtained without intravenous contrast. Angiographic images of the head were obtained using MRA technique without contrast.  COMPARISON:  01/09/2015 head CT.  No comparison brain MR.  FINDINGS: MRI HEAD FINDINGS  Some of the sequences are motion degraded.  No acute infarct.  No intracranial hemorrhage.  No hydrocephalus.  No intracranial mass lesion noted on this unenhanced exam.  Decreased signal intensity of bone marrow may be related to patient's habitus. Correlation with CBC to exclude anemia contributing to this appearance may be considered.  Cerebellar tonsils minimally low lying but within range of normal limits. Pituitary region, pineal region and orbital structures unremarkable.  Minimal to mild paranasal sinus mucosal thickening.  Major intracranial vascular structures are patent.  MRA HEAD FINDINGS  Anterior circulation without medium or large size vessel significant stenosis or occlusion.  Minimal irregularity of the left internal carotid artery proximal to the petrous segment may be related to artifact versus minimal narrowing.  Fetal type contribution to the posterior cerebral arteries bilaterally.  Ectatic vertebral arteries and basilar artery without high-grade stenosis.  Poor delineation of the left anterior inferior cerebellar artery.  Narrowing of the P1 segment of the left posterior cerebral artery. This is proximal to the fetal type contribution to the left posterior cerebral artery.  Slight irregularity branch vessels.  No aneurysm noted.  IMPRESSION: MRI HEAD  Some of the sequences are motion degraded.  No acute infarct.  No intracranial hemorrhage.  No  intracranial mass lesion noted on this unenhanced exam.  Decreased signal intensity of bone marrow may be related to patient's habitus. Correlation with CBC to exclude anemia contributing to this appearance may be considered.  Minimal to mild paranasal sinus mucosal thickening.  MRA HEAD FINDINGS  No medium or large size vessel significant stenosis or occlusion.  Please see above.   Electronically Signed   By: Genia Del M.D.   On: 01/10/2015 07:42   US Abdomen Complete  01/29/2015   CLINICAL DATA:  Cirrhosis, abdominal surgery.  EXAM: ULTRASOUND ABDOMEN COMPLETE  COMPARISON:  CT 01/29/2015  FINDINGS: Exam is limited by patient body habitus.  Gallbladder: Single a 1.4 cm stone within the gallbladder lumen. No pericholecystic fluid. Negative sonographic Murphy's sign.  Common bile duct: Diameter: Normal at 5 mm.  Liver: Liver is uniformly decreased in echogenicity. This is consistent with  the fibrosis. The liver is nodular. No ascites evident.  IVC: No abnormality visualized.  Pancreas: Visualized portion unremarkable.  Spleen: Enlarged at 13.5 x 14.6 x 7.3 cm. Calculated volume of 746 cubic cm.  Right Kidney: Length: 13.5 cm. Echogenicity within normal limits. No mass or hydronephrosis visualized.  Left Kidney: Length: 13.8 cm. Echogenicity within normal limits. No mass or hydronephrosis visualized.  Abdominal aorta: No aneurysm visualized.  Other findings: No ascites  IMPRESSION: 1. No acute abdominal findings. 2. Dense nodule liver consistent with cirrhosis. 3. Single gallstone.  NO cholecystitis. 4. No evidence ascites.   Electronically Signed   By: Suzy Bouchard M.D.   On: 01/29/2015 15:25   Mr Jodene Nam Head/brain Wo Cm  01/10/2015   CLINICAL DATA:  44 year old hypertensive, obese, borderline diabetic male presenting with episode of slurred speech and difficulty finding words with confusion. Subsequent encounter.  EXAM: MRI HEAD WITHOUT CONTRAST  MRA HEAD WITHOUT CONTRAST  TECHNIQUE: Multiplanar, multiecho  pulse sequences of the brain and surrounding structures were obtained without intravenous contrast. Angiographic images of the head were obtained using MRA technique without contrast.  COMPARISON:  01/09/2015 head CT.  No comparison brain MR.  FINDINGS: MRI HEAD FINDINGS  Some of the sequences are motion degraded.  No acute infarct.  No intracranial hemorrhage.  No hydrocephalus.  No intracranial mass lesion noted on this unenhanced exam.  Decreased signal intensity of bone marrow may be related to patient's habitus. Correlation with CBC to exclude anemia contributing to this appearance may be considered.  Cerebellar tonsils minimally low lying but within range of normal limits. Pituitary region, pineal region and orbital structures unremarkable.  Minimal to mild paranasal sinus mucosal thickening.  Major intracranial vascular structures are patent.  MRA HEAD FINDINGS  Anterior circulation without medium or large size vessel significant stenosis or occlusion.  Minimal irregularity of the left internal carotid artery proximal to the petrous segment may be related to artifact versus minimal narrowing.  Fetal type contribution to the posterior cerebral arteries bilaterally.  Ectatic vertebral arteries and basilar artery without high-grade stenosis.  Poor delineation of the left anterior inferior cerebellar artery.  Narrowing of the P1 segment of the left posterior cerebral artery. This is proximal to the fetal type contribution to the left posterior cerebral artery.  Slight irregularity branch vessels.  No aneurysm noted.  IMPRESSION: MRI HEAD  Some of the sequences are motion degraded.  No acute infarct.  No intracranial hemorrhage.  No intracranial mass lesion noted on this unenhanced exam.  Decreased signal intensity of bone marrow may be related to patient's habitus. Correlation with CBC to exclude anemia contributing to this appearance may be considered.  Minimal to mild paranasal sinus mucosal thickening.  MRA  HEAD FINDINGS  No medium or large size vessel significant stenosis or occlusion.  Please see above.   Electronically Signed   By: Genia Del M.D.   On: 01/10/2015 07:42   Ct Renal Stone Study  01/29/2015   CLINICAL DATA:  Right flank pain.  Urinary urgency.  EXAM: CT ABDOMEN AND PELVIS WITHOUT CONTRAST  TECHNIQUE: Multidetector CT imaging of the abdomen and pelvis was performed following the standard protocol without IV contrast.  COMPARISON:  None.  FINDINGS: The included lung bases are clear.  The kidneys are symmetric in size without stones or hydronephrosis. There is no perinephric stranding. Both ureters are decompressed without stones along the course.  The liver has nodular contour with hypertrophied left hepatic lobe and atrophied right hepatic lobe.  No evidence of underlying focal lesion on noncontrast exam. Calcified gallstone within physiologically distended gallbladder. There is portal hypertension with splenomegaly, spleen measures 17.7 x 15.6 x 6.6 cm, volume of 1087 mL. There are multiple perisplenic and left upper quadrant collaterals. Probable splenorenal collaterals. No ascites.  No peripancreatic inflammatory change or ductal dilatation. The adrenal glands are normal.  There are no dilated or thickened bowel loops. The colon is redundant. The appendix is not definitively identified. No inflammatory change in the right lower quadrant to suggest appendicitis. No free air.  The abdominal aorta is normal in caliber. Multiple retroperitoneal soft tissue densities likely related to collateral vessels.  Within the pelvis the bladder is minimally distended. There are multiple enlarged left inguinal lymph nodes, largest with short axis dimension of 1.7 cm. There is left external iliac adenopathy.  Multilevel degenerative change in the spine without acute osseous abnormality. There is transitional lumbosacral anatomy. No focal osseous lesion.  IMPRESSION: 1. No renal stones or obstructive uropathy. 2.  Cirrhotic liver morphology with portal hypertension, splenomegaly and multiple perisplenic varices and collaterals in the left abdomen. 3. Cholelithiasis. 4. Left external iliac and inguinal adenopathy. Etiology is uncertain. Clinical correlation to exclude left lower extremity infection recommended.   Electronically Signed   By: Jeb Levering M.D.   On: 01/29/2015 03:20    Microbiology: Recent Results (from the past 240 hour(s))  Urine culture     Status: None   Collection Time: 01/28/15  7:33 PM  Result Value Ref Range Status   Specimen Description URINE, RANDOM  Final   Special Requests NONE  Final   Colony Count   Final    7,000 COLONIES/ML Performed at [facility]    Culture   Final    INSIGNIFICANT GROWTH Performed at [facility]    Report Status 01/30/2015 FINAL  Final  Culture, blood (routine x 2)     Status: None (Preliminary result)   Collection Time: 01/29/15  3:34 AM  Result Value Ref Range Status   Specimen Description BLOOD RIGHT ARM  Final   Special Requests BOTTLES DRAWN AEROBIC AND ANAEROBIC 10CC EA  Final   Culture   Final           BLOOD CULTURE RECEIVED NO GROWTH TO DATE CULTURE WILL BE HELD FOR 5 DAYS BEFORE ISSUING A FINAL NEGATIVE REPORT Performed at [facility]    Report Status PENDING  Incomplete  Culture, blood (routine x 2)     Status: None (Preliminary result)   Collection Time: 01/29/15  3:45 AM  Result Value Ref Range Status   Specimen Description BLOOD RIGHT HAND  Final   Special Requests BOTTLES DRAWN AEROBIC ONLY 10CC  Final   Culture   Final           BLOOD CULTURE RECEIVED NO GROWTH TO DATE CULTURE WILL BE HELD FOR 5 DAYS BEFORE ISSUING A FINAL NEGATIVE REPORT Performed at [facility]    Report Status PENDING  Incomplete     Labs: Basic Metabolic Panel:  Recent Labs Lab 01/29/15 0238 01/29/15 0832 01/30/15 1139  NA 133* 135 135  K 3.8 3.8 3.7  CL 105 105 106  CO2 _0 GLUCOSE 124* 132*  99  BUN _1 CREATININE 1.08 1.12 1.03  CALCIUM 8.2* 8.1* 8.1*   Liver Function Tests:  Recent Labs Lab 01/30/15 1435  AST 68*  ALT 25  ALKPHOS 67  BILITOT 1.4*  PROT 6.3  ALBUMIN 2.5*   No results for input(s): LIPASE, AMYLASE in the last 168 hours. No results for input(s): AMMONIA in the last 168 hours. CBC:  Recent Labs Lab 01/29/15 0238 01/29/15 0832 01/30/15 1139  WBC 16.7* 13.3* 8.9  HGB 13.0 12.2* 12.9*  HCT 36.6* 35.0* 37.0*  MCV 93.4 93.1 93.9  PLT 119* 120* 108*   Cardiac Enzymes: No results for input(s): CKTOTAL, CKMB, CKMBINDEX, TROPONINI in the last 168 hours. BNP: BNP (last 3 results) No results for input(s): BNP in the last 8760 hours.  ProBNP (last 3 results) No results for input(s): PROBNP in the last 8760 hours.  CBG:  Recent Labs Lab 01/29/15 1153 01/29/15 1628  GLUCAP 90 119*       Signed:  Aisia Correira S  [facility] 01/30/2015, 4:26 PM

## 2015-02-04 LAB — CULTURE, BLOOD (ROUTINE X 2): Culture: NO GROWTH

## 2015-02-06 ENCOUNTER — Telehealth: Payer: Self-pay | Admitting: Internal Medicine

## 2015-02-06 LAB — CULTURE, BLOOD (ROUTINE X 2)

## 2015-02-06 NOTE — Telephone Encounter (Signed)
Left message to give me a call to discuss recent bloodwork results of + blood cx with MRSA

## 2015-02-07 ENCOUNTER — Telehealth: Payer: Self-pay | Admitting: Internal Medicine

## 2015-02-07 NOTE — Telephone Encounter (Signed)
Patient is doing well since being discharged from the hospital. He finished taking his course of oral antibiotics yesterday. i have told him that his blood culture test was positive from recent hospitalization, though i suspect that it may be a contaminant since it took until day 6 to grow. In addition, patient appears to be back to his usual health.  I recommended to him to follow up with pcp office for check up and repeat blood cx x 2 to ensure no further bacteremia to warrant further work up. Will also reach out to dr. Alroy Dust.

## 2015-02-20 ENCOUNTER — Telehealth: Payer: Self-pay | Admitting: Neurology

## 2015-02-20 NOTE — Telephone Encounter (Signed)
done

## 2015-02-21 ENCOUNTER — Ambulatory Visit (INDEPENDENT_AMBULATORY_CARE_PROVIDER_SITE_OTHER): Payer: BLUE CROSS/BLUE SHIELD | Admitting: Neurology

## 2015-02-21 DIAGNOSIS — E7800 Pure hypercholesterolemia, unspecified: Secondary | ICD-10-CM

## 2015-02-21 DIAGNOSIS — R0683 Snoring: Secondary | ICD-10-CM

## 2015-02-21 DIAGNOSIS — G478 Other sleep disorders: Secondary | ICD-10-CM

## 2015-02-21 DIAGNOSIS — E662 Morbid (severe) obesity with alveolar hypoventilation: Secondary | ICD-10-CM

## 2015-02-21 DIAGNOSIS — Z6841 Body Mass Index (BMI) 40.0 and over, adult: Secondary | ICD-10-CM

## 2015-02-21 NOTE — Sleep Study (Signed)
Please see the scanned sleep study interpretation located in the Procedure tab within the Chart Review section. 

## 2015-03-07 ENCOUNTER — Encounter: Payer: Self-pay | Admitting: Neurology

## 2015-03-07 ENCOUNTER — Telehealth: Payer: Self-pay | Admitting: *Deleted

## 2015-03-07 ENCOUNTER — Encounter: Payer: Self-pay | Admitting: *Deleted

## 2015-03-07 ENCOUNTER — Other Ambulatory Visit: Payer: Self-pay | Admitting: Neurology

## 2015-03-07 DIAGNOSIS — G4733 Obstructive sleep apnea (adult) (pediatric): Secondary | ICD-10-CM

## 2015-03-07 NOTE — Telephone Encounter (Signed)
Patient was contacted and provided the results of his split night sleep study.  Patient was informed that there was a diagnosis of sleep apnea and treatment in the form of CPAP therapy was advised.  Patient was referred to [facility] for set up.  Dr. Leonie Man was routed a copy of the results.  The patient gave verbal permission to mail a copy of his test results.   Patient instructed to contact our office 6-8 weeks post set up to schedule a follow up appointment.

## 2015-03-22 ENCOUNTER — Other Ambulatory Visit (HOSPITAL_COMMUNITY): Payer: BLUE CROSS/BLUE SHIELD

## 2015-03-30 NOTE — H&P (Addendum)
Ryan Burgess is an 44 y.o. male.   Chief Complaint: Positive blood cultures HPI: Patient is a 44 year old gentleman with history of prior TIA, ? Hypertension, morbid obesity was admitted to [facility] on 01/29/2015 and discharged the following day with a diagnosis of sepsis, UTI.  He was also admitted on 01/09/2015 with altered mental status.  He had persistent positive blood cultures, he is now referred to me for performing TEE to exclude valvular vegetations and to exclude any cardiac source for recurrent sepsis.  Patient was seen in the recent basis by Dr. Kelton Pillar and found to have a tick bite on the inner side of the thigh a week ago when he visited Dr. Laurann Montana and reported this on 03/20/2015. No new symptoms. No fever or chills since almost 6 weeks ago. Has chronic leg edema.  Past Medical History  Diagnosis Date  . GERD (gastroesophageal reflux disease)   . Obesity   . Hypertension   . Snoring   . TIA (transient ischemic attack)   . High cholesterol     Past Surgical History  Procedure Laterality Date  . Mouth surgery      Family History  Problem Relation Age of Onset  . Leukemia Mother   . Heart disease Maternal Grandmother   . Heart disease Paternal Grandmother   . Diabetes Father   . Dementia Paternal Grandmother   . COPD Maternal Grandmother    Social History:  reports that he has never smoked. He has never used smokeless tobacco. He reports that he drinks about 0.6 - 1.2 oz of alcohol per week. He reports that he does not use illicit drugs.  Allergies: No Known Allergies  No prescriptions prior to admission   Review of Systems - Negative except chronic leg edema, uses cpap for sleep apnea. No recent weight loss.  Filed Vitals:   04/03/15 0807  BP: 154/50  Pulse: 78  Temp:   Resp: 19   Blood pressure 154/50, pulse 78, temperature 98.3 F (36.8 C), temperature source Oral, resp. rate 19, height 6\' 5"  (1.956 m), weight 217.273 kg (479 lb),  SpO2 98 %.    General appearance: alert, cooperative, appears older than stated age and morbidly obese Eyes: negative Neck: no adenopathy, no carotid bruit, no JVD and supple, symmetrical, trachea midline Neck: JVP - normal, carotids 2+= without bruits Resp: clear to auscultation bilaterally Chest wall: no tenderness Cardio: regular rate and rhythm, S1, S2 normal, no murmur, click, rub or gallop GI: soft, non-tender; bowel sounds normal; no masses,  no organomegaly and pannus present Extremities: edema 3 plus with chronic dermatitis and skin peeling and dryness Pulses: Faint pedal pulses Skin: No ulcerations. Skin excorition legs noted Neurologic: Grossly normal  No results found for this or any previous visit (from the past 48 hour(s)). No results found.  Labs:   Lab Results  Component Value Date   WBC 8.9 01/30/2015   HGB 12.9* 01/30/2015   HCT 37.0* 01/30/2015   MCV 93.9 01/30/2015   PLT 108* 01/30/2015   No results for input(s): NA, K, CL, CO2, BUN, CREATININE, CALCIUM, PROT, BILITOT, ALKPHOS, ALT, AST, GLUCOSE in the last 168 hours.  Invalid input(s): LABALBU No results found for: CKTOTAL, CKMB, CKMBINDEX, TROPONINI  Lipid Panel     Component Value Date/Time   CHOL 172 01/10/2015 0645   TRIG 58 01/10/2015 0645   HDL 35* 01/10/2015 0645   CHOLHDL 4.9 01/10/2015 0645   VLDL 12 01/10/2015 0645   LDLCALC  125* 01/10/2015 0645     Assessment/Plan 1. Sepsis with recurrent positive blood cultures, cultures growing methicillin-resistant staph aureus.  2.  Mild hyperlipidemia 3.  Hypertension 4.  Morbid obesity  Recommendation: I have discussed the TEE, risks, benefits and alternatives to the patient.  Previously done echocardiogram was suboptimal, due to his body habitus.  Less than 1% risk of esophageal perforation, aspiration pneumonia, trauma, bleeding was explained to the patient and patient is willing to proceed.   Laverda Page, MD 03/30/2015, 9:14  PM [facility] [place] Pager: [phone] Office: [phone] If no answer: Cell:  [phone]

## 2015-04-03 ENCOUNTER — Ambulatory Visit (HOSPITAL_COMMUNITY)
Admission: RE | Admit: 2015-04-03 | Discharge: 2015-04-03 | Disposition: A | Discharge status: Home / Self Care | Payer: BLUE CROSS/BLUE SHIELD | Source: Ambulatory Visit | Attending: Cardiology | Admitting: Cardiology

## 2015-04-03 ENCOUNTER — Other Ambulatory Visit: Payer: Self-pay | Admitting: Cardiology

## 2015-04-03 ENCOUNTER — Encounter (HOSPITAL_COMMUNITY): Payer: Self-pay | Admitting: *Deleted

## 2015-04-03 ENCOUNTER — Encounter (HOSPITAL_COMMUNITY)
Admission: RE | Disposition: A | Discharge status: Home / Self Care | Payer: Self-pay | Source: Ambulatory Visit | Attending: Cardiology

## 2015-04-03 DIAGNOSIS — R7881 Bacteremia: Secondary | ICD-10-CM

## 2015-04-03 DIAGNOSIS — G459 Transient cerebral ischemic attack, unspecified: Secondary | ICD-10-CM | POA: Diagnosis not present

## 2015-04-03 HISTORY — PX: TEE WITHOUT CARDIOVERSION: SHX5443

## 2015-04-03 LAB — GLUCOSE, CAPILLARY: Glucose-Capillary: 81 mg/dL (ref 70–99)

## 2015-04-03 SURGERY — ECHOCARDIOGRAM, TRANSESOPHAGEAL
Anesthesia: Moderate Sedation

## 2015-04-03 MED ORDER — FENTANYL CITRATE (PF) 100 MCG/2ML IJ SOLN
INTRAMUSCULAR | Status: DC | PRN
  Administered 2015-04-03 (×2): 25 ug via INTRAVENOUS

## 2015-04-03 MED ORDER — MIDAZOLAM HCL 10 MG/2ML IJ SOLN
INTRAMUSCULAR | Status: DC | PRN
  Administered 2015-04-03: 3 mg via INTRAVENOUS

## 2015-04-03 MED ORDER — BUTAMBEN-TETRACAINE-BENZOCAINE 2-2-14 % EX AERO
INHALATION_SPRAY | CUTANEOUS | Status: DC | PRN
  Administered 2015-04-03: 2 via TOPICAL

## 2015-04-03 MED ORDER — MIDAZOLAM HCL 5 MG/ML IJ SOLN
INTRAMUSCULAR | Status: AC
  Filled 2015-04-03: qty 1

## 2015-04-03 MED ORDER — DIPHENHYDRAMINE HCL 50 MG/ML IJ SOLN
INTRAMUSCULAR | Status: AC
  Filled 2015-04-03: qty 1

## 2015-04-03 MED ORDER — SODIUM CHLORIDE 0.9 % IV SOLN
INTRAVENOUS | Status: DC
  Administered 2015-04-03: 07:00:00 via INTRAVENOUS

## 2015-04-03 MED ORDER — FENTANYL CITRATE (PF) 100 MCG/2ML IJ SOLN
INTRAMUSCULAR | Status: AC
  Filled 2015-04-03: qty 2

## 2015-04-03 NOTE — Discharge Instructions (Signed)
Esophagogastroduodenoscopy °Esophagogastroduodenoscopy (EGD) is a procedure to examine the lining of the esophagus, stomach, and first part of the small intestine (duodenum). A long, flexible, lighted tube with a camera attached (endoscope) is inserted down the throat to view these organs. This procedure is done to detect problems or abnormalities, such as inflammation, bleeding, ulcers, or growths, in order to treat them. The procedure lasts about 5-20 minutes. It is usually an outpatient procedure, but it may need to be performed in emergency cases in the hospital. °LET YOUR CAREGIVER KNOW ABOUT:  °· Allergies to food or medicine. °· All medicines you are taking, including vitamins, herbs, eyedrops, and over-the-counter medicines and creams. °· Use of steroids (by mouth or creams). °· Previous problems you or members of your family have had with the use of anesthetics. °· Any blood disorders you have. °· Previous surgeries you have had. °· Other health problems you have. °· Possibility of pregnancy, if this applies. °RISKS AND COMPLICATIONS  °Generally, EGD is a safe procedure. However, as with any procedure, complications can occur. Possible complications include: °· Infection. °· Bleeding. °· Tearing (perforation) of the esophagus, stomach, or duodenum. °· Difficulty breathing or not being able to breath. °· Excessive sweating. °· Spasms of the larynx. °· Slowed heartbeat. °· Low blood pressure. °BEFORE THE PROCEDURE °· Do not eat or drink anything for 6-8 hours before the procedure or as directed by your caregiver. °· Ask your caregiver about changing or stopping your regular medicines. °· If you wear dentures, be prepared to remove them before the procedure. °· Arrange for someone to drive you home after the procedure. °PROCEDURE  °· A vein will be accessed to give medicines and fluids. A medicine to relax you (sedative) and a pain reliever will be given through that access into the vein. °· A numbing medicine  (local anesthetic) may be sprayed on your throat for comfort and to stop you from gagging or coughing. °· A mouth guard may be placed in your mouth to protect your teeth and to keep you from biting on the endoscope. °· You will be asked to lie on your left side. °· The endoscope is inserted down your throat and into the esophagus, stomach, and duodenum. °· Air is put through the endoscope to allow your caregiver to view the lining of your esophagus clearly. °· The esophagus, stomach, and duodenum is then examined. During the exam, your caregiver may: °¨ Remove tissue to be examined under a microscope (biopsy) for inflammation, infection, or other medical problems. °¨ Remove growths. °¨ Remove objects (foreign bodies) that are stuck. °¨ Treat any bleeding with medicines or other devices that stop tissues from bleeding (hot cautery, clipping devices). °¨ Widen (dilate) or stretch narrowed areas of the esophagus and stomach. °· The endoscope will then be withdrawn. °AFTER THE PROCEDURE °· You will be taken to a recovery area to be monitored. You will be able to go home once you are stable and alert. °· Do not eat or drink anything until the local anesthetic and numbing medicines have worn off. You may choke. °· It is normal to feel bloated, have pain with swallowing, or have a sore throat for a short time. This will wear off. °· Your caregiver should be able to discuss his or her findings with you. It will take longer to discuss the test results if any biopsies were taken. °Document Released: 03/21/2005 Document Revised: 04/04/2014 Document Reviewed: 10/21/2012 °ExitCare® Patient Information ©2015 [employer]. This information is not   intended to replace advice given to you by your health care provider. Make sure you discuss any questions you have with your health care provider. ° °

## 2015-04-03 NOTE — Progress Notes (Signed)
  Echocardiogram 2D Echocardiogram has been performed.  Ryan Burgess M 04/03/2015, 3:10 PM

## 2015-04-03 NOTE — Interval H&P Note (Signed)
History and Physical Interval Note:  04/03/2015 8:24 AM  Ryan Burgess  has presented today for surgery, with the diagnosis of TIA  The various methods of treatment have been discussed with the patient and family. After consideration of risks, benefits and other options for treatment, the patient has consented to  Procedure(s): TRANSESOPHAGEAL ECHOCARDIOGRAM (TEE) (N/A) as a surgical intervention .  The patient's history has been reviewed, patient examined, no change in status, stable for surgery.  I have reviewed the patient's chart and labs.  Questions were answered to the patient's satisfaction.     Adrian Prows

## 2015-04-03 NOTE — CV Procedure (Signed)
Normal TEE. No vegetation. No PFO. Normal LVEF and RVEF. Trace TR and MR

## 2015-04-04 ENCOUNTER — Encounter (HOSPITAL_COMMUNITY): Payer: Self-pay | Admitting: Cardiology

## 2015-04-26 ENCOUNTER — Encounter: Payer: Self-pay | Admitting: Neurology

## 2015-04-26 ENCOUNTER — Ambulatory Visit (INDEPENDENT_AMBULATORY_CARE_PROVIDER_SITE_OTHER): Payer: BLUE CROSS/BLUE SHIELD | Admitting: Neurology

## 2015-04-26 VITALS — BP 144/74 | HR 77 | Wt >= 6400 oz

## 2015-04-26 DIAGNOSIS — G459 Transient cerebral ischemic attack, unspecified: Secondary | ICD-10-CM | POA: Diagnosis not present

## 2015-04-26 NOTE — Patient Instructions (Signed)
I had a long d/w patient about his recent episode of possible TIA risk for recurrent stroke/TIAs, personally independently reviewed imaging studies and stroke evaluation results and answered questions.Continue aspirin 81 mg orally every day  for secondary stroke prevention and maintain strict control of hypertension with blood pressure goal below 130/90, diabetes with hemoglobin A1c goal below 6.5% and lipids with LDL cholesterol goal below 70 mg/dL. Check follow-up lipid profile and LFTs I also counseled him to be compliant with using CPAP every night. I also advised the patient to eat a healthy diet with plenty of whole grains, cereals, fruits and vegetables, exercise regularly and maintain ideal body weight Followup in the future with me only as needed and keep scheduled follow-up appointment at the sleep clinic next week.  Stroke Prevention Some medical conditions and behaviors are associated with an increased chance of having a stroke. You may prevent a stroke by making healthy choices and managing medical conditions. HOW CAN I REDUCE MY RISK OF HAVING A STROKE?   Stay physically active. Get at least 30 minutes of activity on most or all days.  Do not smoke. It may also be helpful to avoid exposure to secondhand smoke.  Limit alcohol use. Moderate alcohol use is considered to be:  No more than 2 drinks per day for men.  No more than 1 drink per day for nonpregnant women.  Eat healthy foods. This involves:  Eating 5 or more servings of fruits and vegetables a day.  Making dietary changes that address high blood pressure (hypertension), high cholesterol, diabetes, or obesity.  Manage your cholesterol levels.  Making food choices that are high in fiber and low in saturated fat, trans fat, and cholesterol may control cholesterol levels.  Take any prescribed medicines to control cholesterol as directed by your health care provider.  Manage your diabetes.  Controlling your carbohydrate  and sugar intake is recommended to manage diabetes.  Take any prescribed medicines to control diabetes as directed by your health care provider.  Control your hypertension.  Making food choices that are low in salt (sodium), saturated fat, trans fat, and cholesterol is recommended to manage hypertension.  Take any prescribed medicines to control hypertension as directed by your health care provider.  Maintain a healthy weight.  Reducing calorie intake and making food choices that are low in sodium, saturated fat, trans fat, and cholesterol are recommended to manage weight.  Stop drug abuse.  Avoid taking birth control pills.  Talk to your health care provider about the risks of taking birth control pills if you are over 74 years old, smoke, get migraines, or have ever had a blood clot.  Get evaluated for sleep disorders (sleep apnea).  Talk to your health care provider about getting a sleep evaluation if you snore a lot or have excessive sleepiness.  Take medicines only as directed by your health care provider.  For some people, aspirin or blood thinners (anticoagulants) are helpful in reducing the risk of forming abnormal blood clots that can lead to stroke. If you have the irregular heart rhythm of atrial fibrillation, you should be on a blood thinner unless there is a good reason you cannot take them.  Understand all your medicine instructions.  Make sure that other conditions (such as anemia or atherosclerosis) are addressed. SEEK IMMEDIATE MEDICAL CARE IF:   You have sudden weakness or numbness of the face, arm, or leg, especially on one side of the body.  Your face or eyelid droops to one  side.  You have sudden confusion.  You have trouble speaking (aphasia) or understanding.  You have sudden trouble seeing in one or both eyes.  You have sudden trouble walking.  You have dizziness.  You have a loss of balance or coordination.  You have a sudden, severe headache  with no known cause.  You have new chest pain or an irregular heartbeat. Any of these symptoms may represent a serious problem that is an emergency. Do not wait to see if the symptoms will go away. Get medical help at once. Call your local emergency services (911 in [place] Do not drive yourself to the hospital. Document Released: 12/26/2004 Document Revised: 04/04/2014 Document Reviewed: 05/21/2013 [facility] Patient Information 2015 [place], [place]. This information is not intended to replace advice given to you by your health care provider. Make sure you discuss any questions you have with your health care provider.

## 2015-04-26 NOTE — Progress Notes (Signed)
[facility] [address] [place]. [place] [address] [phone]       OFFICE FOLLOW-UP NOTE  [name] Date of Birth:  20-Mar-1971 Medical Record Number:  [id number]   HPI: 35 year morbidly obese Caucasian male seen for first office follow-up visit following hospital consultation for possible TIA in February 2016. He presented with speech difficulties, word finding difficulties, verbal perseveration and not feeling right to [facility]. Due to morbid obesity he was sent to [facility] is could not fit into the scan of that. His symptoms resolved by the time he was seen. MRI scan of the brain was suboptimal but did not show an acute infarct. Images were degraded by motion artifact. CT angiogram of the neck and brain were both obtained and was suboptimal due to patient's size but were negative for any significant carotid or vertebral artery stenosis in the neck or in the brain. Transthoracic echo showed normal ejection fraction. Hemoglobin A1c was borderline at 6.1. Lipid profile was significant for elevated LDL cholesterol of 125. He was started on aspirin 81 as well as Lipitor 20 mg and advised weight loss and diet control. Patient did well and has not had any recurrent speech or TIA symptoms. He developed a kidney stone and urinary tract infection along with bacteremia a month ago and had outpatient TEE done by Dr. Einar Gip on 04/03/15 which was normal. He is starting aspirin without bleeding or bruising and Lipitor without myalgias or arthralgias. He has been watching his diet and has lost in fact 60 pounds. He underwent sleep study on 03/07/15 and was diagnosed with severe obstructive sleep apnea. He has been regularly using CPAP for the last 1 month and feels that is not snoring, his wife is sleeping better and he has more energy during the day and is less tired. He has an appointment at the sleep clinic next week  ROS:   14 system review of systems is  positive for no complaints today  PMH:  Past Medical History  Diagnosis Date  . GERD (gastroesophageal reflux disease)   . Obesity   . Hypertension   . Snoring   . TIA (transient ischemic attack)   . High cholesterol   . Sleep apnea     uses CPAP  . Kidney stones 01/2015    UTI    Social History:  History   Social History  . Marital Status: Married    Spouse Name: Ryan Burgess  . Number of Children: 2  . Years of Education: masters   Occupational History  . Not on file.   Social History Main Topics  . Smoking status: Never Smoker   . Smokeless tobacco: Never Used  . Alcohol Use: 0.6 - 1.2 oz/week    1-2 Standard drinks or equivalent per week     Comment: every three months  . Drug Use: No  . Sexual Activity: Not on file   Other Topics Concern  . Not on file   Social History Narrative   Patient lives at home with his wife Ryan Burgess) and two children.    Patient works full time at [employer] in [place].   Right handed.   Caffeine 1-2 sodas and coffee daily.    Medications:   Current Outpatient Prescriptions on File Prior to Visit  Medication Sig Dispense Refill  . aspirin EC 81 MG EC tablet Take 1 tablet (81 mg total) by mouth daily. [place]  tablet 11  . atorvastatin (LIPITOR) 20 MG tablet Take 1 tablet (20 mg total) by mouth at bedtime. 30 tablet 5  . blood glucose meter kit and supplies KIT Dispense based on patient and insurance preference. Use up to four times daily as directed. (FOR ICD-9 250.00, 250.01). 1 each 0  . furosemide (LASIX) 20 MG tablet Take 1 tablet (20 mg total) by mouth daily. 30 tablet 6  . Multiple Vitamin (MULTIVITAMIN WITH MINERALS) TABS tablet Take 1 tablet by mouth daily.    . phenylephrine (SUDAFED PE) 10 MG TABS tablet Take 10 mg by mouth every 4 (four) hours as needed (for congestion).      No current facility-administered medications on file prior to visit.    Allergies:  No Known  Allergies  Physical Exam General: obese young Caucasian male, seated, in no evident distress Head: head normocephalic and atraumatic.  Neck: supple with no carotid or supraclavicular bruits Cardiovascular: regular rate and rhythm, no murmurs Musculoskeletal: no deformity Skin:  no rash/petichiae Vascular:  Normal pulses all extremities Filed Vitals:   04/26/15 0822  BP: 144/74  Pulse: 77   Neurologic Exam Mental Status: Awake and fully alert. Oriented to place and time. Recent and remote memory intact. Attention span, concentration and fund of knowledge appropriate. Mood and affect appropriate.  Cranial Nerves: Fundoscopic exam not done   Pupils equal, briskly reactive to light. Extraocular movements full without nystagmus. Visual fields full to confrontation. Hearing intact. Facial sensation intact. Face, tongue, palate moves normally and symmetrically.  Motor: Normal bulk and tone. Normal strength in all tested extremity muscles. Sensory.: intact to touch ,pinprick .position and vibratory sensation.  Coordination: Rapid alternating movements normal in all extremities. Finger-to-nose and heel-to-shin performed accurately bilaterally. Gait and Station: Arises from chair without difficulty. Stance is normal. Gait demonstrates normal stride length and balance . Able to heel, toe and tandem walk without difficulty.  Reflexes: 1+ and symmetric. Toes downgoing.   NIHSS  0 Modified [place]  0  ASSESSMENT: 33 year obese Caucasian male with transient episode of speech difficulty and questionable left-sided weakness in February 2016 possible TIA with vascular risk factors of morbid obesity, sleep apnea, hyperlipidemia    PLAN: I had a long d/w patient about his recent episode of possible TIA risk for recurrent stroke/TIAs, personally independently reviewed imaging studies and stroke evaluation results and answered questions.Continue aspirin 81 mg orally every day  for secondary stroke  prevention and maintain strict control of hypertension with blood pressure goal below 130/90, diabetes with hemoglobin A1c goal below 6.5% and lipids with LDL cholesterol goal below 70 mg/dL. Check follow-up lipid profile and LFTs I also counseled him to be compliant with using CPAP every night. I also advised the patient to eat a healthy diet with plenty of whole grains, cereals, fruits and vegetables, exercise regularly and maintain ideal body weight Followup in the future with me only as needed and keep scheduled follow-up appointment at the sleep clinic next week.  Antony Contras, MD  Note: This document was prepared with digital dictation and possible smart phrase technology. Any transcriptional errors that result from this process are unintentional

## 2015-05-02 ENCOUNTER — Ambulatory Visit (INDEPENDENT_AMBULATORY_CARE_PROVIDER_SITE_OTHER): Payer: BLUE CROSS/BLUE SHIELD | Admitting: Neurology

## 2015-05-02 ENCOUNTER — Encounter: Payer: Self-pay | Admitting: Neurology

## 2015-05-02 VITALS — BP 156/72 | HR 88 | Resp 22 | Ht 76.77 in | Wt >= 6400 oz

## 2015-05-02 DIAGNOSIS — E662 Morbid (severe) obesity with alveolar hypoventilation: Secondary | ICD-10-CM

## 2015-05-02 DIAGNOSIS — G4733 Obstructive sleep apnea (adult) (pediatric): Secondary | ICD-10-CM | POA: Diagnosis not present

## 2015-05-02 DIAGNOSIS — Z9989 Dependence on other enabling machines and devices: Principal | ICD-10-CM

## 2015-05-02 NOTE — Progress Notes (Signed)
SLEEP MEDICINE CLINIC   Provider:  Larey Seat, M D  Referring Provider: Alroy Dust, Carlean Jews.[name], MD Primary Care Physician:  Donnie Coffin, MD  Chief Complaint  Patient presents with  . Follow-up    cpap, rm 10, alone    HPI:  Ryan Burgess is a 44 y.o. male  seen here as a revisit  from Dr. Leonie Man , after a hospitalization  on the stroke service.,  Ryan Burgess carries the diagnosis of hypertension, borderline diabetes which in H be A1c of 7.0 12- 2014 and earlier this month at 6.1,  morbid obesity, peripheral edema, hyperlipidemia, thrombocytopenia.    As to the possibility of having sleep apnea, Ryan Burgess is married and his wife has observed his sleep she also has reported to him that he is a loud snorer and she has witnessed apneas as has the hospital staff during his hospitalization. His practitioner and ICU nurse both stated that he had witnessed sleep apnea is doing his hospital stay. These were associated with significant desaturations in oxygen and the possibility of CO2 retention was raised in a setting that is usually described as obesity hypoventilation syndrome. They found Ryan Burgess to be a shallow breather and in relation to his body mass index he may not be able to exhale CO2 and exchange it for oxygen. The retention of CO2 also called hypercapnia can then lead to confusion, global headaches, and significant nocturia of his enuresis.   The patient goes to bed between 10 and 11 PM and usually does not have trouble to fall asleep. His sleep latency is between 15 and 30 minutes usually. He would have usually only one bathroom break at night about 1:59 AM and he recalls that dreams stages seemed to start after the bathroom break so it is likely that his REM sleep takes place in the later night or early morning hours. 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 The  patient prefers to sleep on his side, and he wakes up on his side as well. His alarm clock is usually set for 6 AM but his wife states that for 5 AM and he often wakes up with that already. He wakes up most mornings with a morning headache that resolves as the day goes off on, also a symptom of hypercapnia. He does not necessarily feel refreshed or restored in the morning.  He would get an average of 6 hours of sleep at night he rarely naps in daytime letting the opportunity to do so. After lunch she is usually quite sleepy. He endorsed the Epworth sleepiness score at 18 points of the fatigue severity score at 33 points. On weekends he also can sleep in but usually doesn't feel more restored from the additional hour of sleep. The patient does not smoke and uses no tobacco products as all. The patient drinks only socially not regularly he endorsed 1-2 drinks every 3 months. He does drink caffeine 1 cup or soda on some days .   The family history positive for sleep apnea in his father, grandfather did have witnessed apnea.  No ENT surgery, neck surgery or Trauma reported.  He is not a shift worker, regularly works from  8.30  to 5 o'clock.  Interval history from 05-02-15. Ryan Burgess returns  after having undergone a split night polysomnography on 3-20 2-16,  he had endorsed the  Epworth sleepiness score at 18 points.  His BMI exceeded 60 and he was scheduled for a capnography alongside his polysomnography. He was diagnosed with an AHI of 33.1 , REM sleep was noted and no periodic limb movements. He was titrated to 9 cm water pressure and ordered to follow-up with an auto set CPAP.  CPAP was chosen to treat this apnea due to his oxygen nadir at 81% was 91.8 minutes of desaturations,  loud snoring had also been noticed.  Ryan Burgess now uses an auto Pap and has noticed that he sleeps through the night he may have one bathroom break but not several, his wife no longer noticed him to snore. He feels that  the sleep is more refreshing and restorative. The patient used to doze off when he was a passenger in the car driving to work in the morning with his wife. He no longer feels he needs to do this. He endorsed the Epworth sleepiness score today at 8 points down from 18 and the fatigue severity at 22 was greatly reduced.  Compliance record the patient used CPAP 400% of the last 30 days or nights, and over 4 hours each of those nights. His average user time is 6 hours and 26 minutes.   AUTO set machine allows a pressure window between 5 and 15 cm water and a 3 cm EPR. The residual AHI is 1.6 which is a great resolution. The 91st present pressure is 13.5 cm water the 91st percentile airleak is 15.4 L. The patient prefers a full face mask but has facial hair and there will never be an complete air seal achieved but he feels that this is the most comfortable solution for him. I have no reason to change the settings or adjust anything today the patient is comfortable and actually sees a benefit from the therapy. He saw Dr. Leonie Man just last week and has been released from the TIA clinic.     Review of Systems: Out of a complete 14 system review, the patient complains of only the following symptoms, and all other reviewed systems are negative. No longer snoring, no longer nocturia, no longer daytime excessive sleepiness or fatigue.   CPAP _Epworth score  8 from 18 pre CPAP , Fatigue severity score  22 from 33 pre CPAP  , depression score 2.   History   Social History  . Marital Status: Married    Spouse Name: Hoyle Sauer  . Number of Children: 2  . Years of Education: masters   Occupational History  . Not on file.   Social History Main Topics  . Smoking status: Never Smoker   . Smokeless tobacco: Never Used  . Alcohol Use: 0.6 - 1.2 oz/week    1-2 Standard drinks or equivalent per week     Comment: every three months  . Drug Use: No  . Sexual Activity: Not on file   Other Topics Concern  . Not on  file   Social History Narrative   Patient lives at home with his wife Hoyle Sauer) and two children.    Patient works full time at [employer] in [place].   Right handed.   Caffeine 1-2 sodas and coffee daily.    Family History  Problem Relation Age of Onset  . Leukemia Mother   . Heart disease Maternal Grandmother   . Heart disease Paternal Grandmother   . Diabetes Father   . Dementia Paternal Grandmother   .  COPD Maternal Grandmother     Past Medical History  Diagnosis Date  . GERD (gastroesophageal reflux disease)   . Obesity   . Hypertension   . Snoring   . TIA (transient ischemic attack)   . High cholesterol   . Sleep apnea     uses CPAP  . Kidney stones 01/2015    UTI    Past Surgical History  Procedure Laterality Date  . Mouth surgery    . Tee without cardioversion N/A 04/03/2015    Procedure: TRANSESOPHAGEAL ECHOCARDIOGRAM (TEE);  Surgeon: Adrian Prows, MD;  Location: [facility] ENDOSCOPY;  Service: Cardiovascular;  Laterality: N/A;    Current Outpatient Prescriptions  Medication Sig Dispense Refill  . aspirin EC 81 MG EC tablet Take 1 tablet (81 mg total) by mouth daily. 30 tablet 11  . atorvastatin (LIPITOR) 20 MG tablet Take 1 tablet (20 mg total) by mouth at bedtime. 30 tablet 5  . blood glucose meter kit and supplies KIT Dispense based on patient and insurance preference. Use up to four times daily as directed. (FOR ICD-9 250.00, 250.01). 1 each 0  . DEXILANT 60 MG capsule   2  . furosemide (LASIX) 20 MG tablet Take 1 tablet (20 mg total) by mouth daily. 30 tablet 6  . Multiple Vitamin (MULTIVITAMIN WITH MINERALS) TABS tablet Take 1 tablet by mouth daily.    . phenylephrine (SUDAFED PE) 10 MG TABS tablet Take 10 mg by mouth every 4 (four) hours as needed (for congestion).      No current facility-administered medications for this visit.    Allergies as of 05/02/2015  . (No Known Allergies)    Vitals: BP 156/72 mmHg   Pulse 88  Resp 22  Ht 6' 4.77" (1.95 m)  Wt 469 lb (212.737 kg)  BMI 55.95 kg/m2 Last Weight:  Wt Readings from Last 1 Encounters:  05/02/15 469 lb (212.737 kg)       Last Height:   Ht Readings from Last 1 Encounters:  05/02/15 6' 4.77" (1.95 m)    Physical exam:  General: The patient is awake, alert and appears not in acute distress. The patient is well groomed. Head: Normocephalic, atraumatic. Neck is supple. Mallampati 2 ,  neck circumference: 21  . Nasal airflow restricted , TMJ is not evident . Retrognathia is not seen.  He did wear braces and had a retainer.  Cardiovascular:  Regular rate and rhythm , without  murmurs or carotid bruit, and without distended neck veins. Respiratory: Lungs are clear to auscultation. Skin:  Without evidence of edema, or rash Trunk: BMI is severly  elevated and patient  has normal posture.  Neurologic exam : The patient is awake and alert, oriented to place and time.   Memory subjective  described as intact. There is a normal attention span & concentration ability. Speech is fluent without  dysarthria, dysphonia or aphasia. Mood and affect are appropriate.  Cranial nerves: Pupils are inequal  the right pupil is about 1 mm larger than the left and constricts not as quickly to light and not completely. The patient recalls that he had laser surgery to the right eye. Motor exam:Normal tone ,muscle bulk and symmetric strength in all extremities.    Assessment:  After physical and neurologic examination, review of laboratory studies, imaging, neurophysiology testing and pre-existing records, assessment is   Based on the patient's BMI, his clinical history of waking up this morning headaches, and his wife's observation of loud to thunderous snoring in lateral position  of sleep as well as witnessed apneas I strongly suspect that Ryan Burgess has obstructive sleep apnea possibly with obesity hypoventilation and hypercapnia. The appropriate test modality  is an attended polysomnography study as a split study with capnography. His insurer is [employer] which allows [place] to split at an AHI of 15 and score at 3%. Hypercapnia is defined as 50 torr or more.   The patient was advised of the nature of the diagnosed sleep disorder , the treatment options and risks for general a health and wellness arising from not treating the condition. Visit duration was 15 minutes, of which 50% of face to face time were used in discussion of the diagnosis, additional medical history was obtained and test modalities and therapies for OSA were discussed. All questions were answered.   Plan:  Treatment plan and additional workup : SPLIT  The patient is aware that his elevated BMI is his main risk factor for sleep apnea. The BMI is also causative 3 related to his diabetic condition, hypertension, treatment of sleep apnea often results in better control of blood sugars and better control of hypertension as well.  He implemented a diet per PCP.  RV for 30 minutes in 12 month.     Asencion Partridge Macklin Jacquin MD  05/02/2015

## 2015-06-29 ENCOUNTER — Telehealth: Payer: Self-pay | Admitting: Neurology

## 2015-06-29 ENCOUNTER — Other Ambulatory Visit: Payer: Self-pay

## 2015-06-29 DIAGNOSIS — G4733 Obstructive sleep apnea (adult) (pediatric): Secondary | ICD-10-CM

## 2015-06-29 NOTE — Telephone Encounter (Signed)
I contacted [facility] and spoke to [place], and asked her to check on status of pt's cpap supplies. She said they were waiting on an SM card. I asked her to please provide pt with cpap supplies. I also sent another order to [facility] asking for supplies.  I left a message (per DPR) on pt's home machine explaining that I called [facility], sent another supply order over, and to please contact me with further questions and concerns.

## 2015-06-29 NOTE — Telephone Encounter (Signed)
Patient called stating [facility] has put order in for CPAP supply around 06/13/15 but have heard back yet. Please call and advise. Please call [facility].

## 2015-07-18 ENCOUNTER — Other Ambulatory Visit: Payer: Self-pay | Admitting: Gastroenterology

## 2015-07-21 ENCOUNTER — Encounter (HOSPITAL_COMMUNITY): Payer: Self-pay | Admitting: *Deleted

## 2015-07-24 ENCOUNTER — Encounter (HOSPITAL_COMMUNITY): Payer: Self-pay | Admitting: *Deleted

## 2015-07-24 NOTE — Addendum Note (Signed)
Addended by: Arta Silence on: 07/24/2015 09:35 PM   Modules accepted: Orders

## 2015-07-25 ENCOUNTER — Other Ambulatory Visit: Payer: Self-pay | Admitting: Gastroenterology

## 2015-07-26 ENCOUNTER — Encounter (HOSPITAL_COMMUNITY): Payer: Self-pay

## 2015-07-26 ENCOUNTER — Ambulatory Visit (HOSPITAL_COMMUNITY): Payer: BLUE CROSS/BLUE SHIELD | Admitting: Anesthesiology

## 2015-07-26 ENCOUNTER — Ambulatory Visit (HOSPITAL_COMMUNITY)
Admission: RE | Admit: 2015-07-26 | Discharge: 2015-07-26 | Disposition: A | Discharge status: Home / Self Care | Payer: BLUE CROSS/BLUE SHIELD | Source: Ambulatory Visit | Attending: Gastroenterology | Admitting: Gastroenterology

## 2015-07-26 ENCOUNTER — Encounter (HOSPITAL_COMMUNITY)
Admission: RE | Disposition: A | Discharge status: Home / Self Care | Payer: Self-pay | Source: Ambulatory Visit | Attending: Gastroenterology

## 2015-07-26 DIAGNOSIS — I10 Essential (primary) hypertension: Secondary | ICD-10-CM | POA: Insufficient documentation

## 2015-07-26 DIAGNOSIS — R609 Edema, unspecified: Secondary | ICD-10-CM | POA: Diagnosis not present

## 2015-07-26 DIAGNOSIS — Z8673 Personal history of transient ischemic attack (TIA), and cerebral infarction without residual deficits: Secondary | ICD-10-CM | POA: Insufficient documentation

## 2015-07-26 DIAGNOSIS — Z6841 Body Mass Index (BMI) 40.0 and over, adult: Secondary | ICD-10-CM | POA: Insufficient documentation

## 2015-07-26 DIAGNOSIS — K7460 Unspecified cirrhosis of liver: Secondary | ICD-10-CM | POA: Insufficient documentation

## 2015-07-26 DIAGNOSIS — G4733 Obstructive sleep apnea (adult) (pediatric): Secondary | ICD-10-CM | POA: Diagnosis not present

## 2015-07-26 DIAGNOSIS — K766 Portal hypertension: Secondary | ICD-10-CM | POA: Insufficient documentation

## 2015-07-26 DIAGNOSIS — K3189 Other diseases of stomach and duodenum: Secondary | ICD-10-CM | POA: Insufficient documentation

## 2015-07-26 DIAGNOSIS — Z7982 Long term (current) use of aspirin: Secondary | ICD-10-CM | POA: Insufficient documentation

## 2015-07-26 DIAGNOSIS — Z79899 Other long term (current) drug therapy: Secondary | ICD-10-CM | POA: Insufficient documentation

## 2015-07-26 DIAGNOSIS — E780 Pure hypercholesterolemia: Secondary | ICD-10-CM | POA: Diagnosis not present

## 2015-07-26 DIAGNOSIS — E119 Type 2 diabetes mellitus without complications: Secondary | ICD-10-CM | POA: Diagnosis not present

## 2015-07-26 HISTORY — PX: ESOPHAGOGASTRODUODENOSCOPY (EGD) WITH PROPOFOL: SHX5813

## 2015-07-26 LAB — BASIC METABOLIC PANEL
Anion gap: 6 (ref 5–15)
BUN: 11 mg/dL (ref 6–20)
CALCIUM: 8.6 mg/dL — AB (ref 8.9–10.3)
CHLORIDE: 107 mmol/L (ref 101–111)
CO2: 22 mmol/L (ref 22–32)
CREATININE: 0.59 mg/dL — AB (ref 0.61–1.24)
GFR calc Af Amer: >60 mL/min (ref >60)
GFR calc non Af Amer: >60 mL/min (ref >60)
Glucose, Bld: 85 mg/dL (ref 65–99)
Potassium: 4 mmol/L (ref 3.5–5.1)
SODIUM: 135 mmol/L (ref 135–145)

## 2015-07-26 LAB — GLUCOSE, CAPILLARY: Glucose-Capillary: 77 mg/dL (ref 65–99)

## 2015-07-26 SURGERY — ESOPHAGOGASTRODUODENOSCOPY (EGD) WITH PROPOFOL
Anesthesia: Monitor Anesthesia Care

## 2015-07-26 MED ORDER — PROPOFOL 10 MG/ML IV BOLUS
INTRAVENOUS | Status: AC
Start: 2015-07-26 — End: 2015-07-26
  Filled 2015-07-26: qty 20

## 2015-07-26 MED ORDER — PROPOFOL 10 MG/ML IV BOLUS
INTRAVENOUS | Status: AC
  Filled 2015-07-26: qty 20

## 2015-07-26 MED ORDER — LACTATED RINGERS IV SOLN
INTRAVENOUS | Status: DC
  Administered 2015-07-26: 1000 mL via INTRAVENOUS

## 2015-07-26 MED ORDER — SODIUM CHLORIDE 0.9 % IV SOLN: INTRAVENOUS | Status: DC

## 2015-07-26 MED ORDER — PROPOFOL 10 MG/ML IV BOLUS
INTRAVENOUS | Status: DC | PRN
  Administered 2015-07-26 (×2): 40 mg via INTRAVENOUS

## 2015-07-26 MED ORDER — PROPOFOL INFUSION 10 MG/ML OPTIME
INTRAVENOUS | Status: DC | PRN
  Administered 2015-07-26: 150 ug/kg/min via INTRAVENOUS

## 2015-07-26 SURGICAL SUPPLY — 15 items

## 2015-07-26 NOTE — Transfer of Care (Signed)
Immediate Anesthesia Transfer of Care Note  Patient: Ryan Burgess  Procedure(s) Performed: Procedure(s): ESOPHAGOGASTRODUODENOSCOPY (EGD) WITH PROPOFOL (N/A)  Patient Location: PACU and Endoscopy Unit  Anesthesia Type:MAC  Level of Consciousness: awake, alert , oriented and patient cooperative  Airway & Oxygen Therapy: Patient Spontanous Breathing and Patient connected to nasal cannula oxygen  Post-op Assessment: Report given to RN and Post -op Vital signs reviewed and stable  Post vital signs: Reviewed and stable  Last Vitals:  Filed Vitals:   07/26/15 0939  BP: 192/70  Pulse: 76  Temp: 36.7 C  Resp: 25    Complications: No apparent anesthesia complications

## 2015-07-26 NOTE — Anesthesia Preprocedure Evaluation (Addendum)
Anesthesia Evaluation  Patient identified by MRN, date of birth, ID band Patient awake    Reviewed: Allergy & Precautions, H&P , NPO status , Patient's Chart, lab work & pertinent test results  Airway Mallampati: III  TM Distance: >3 FB Neck ROM: full    Dental no notable dental hx. (+) Dental Advisory Given   Pulmonary sleep apnea and Continuous Positive Airway Pressure Ventilation ,  breath sounds clear to auscultation  Pulmonary exam normal       Cardiovascular hypertension, Pt. on medications Normal cardiovascular examRhythm:regular Rate:Normal  Sinus tachycardia   Neuro/Psych History acute encephalopathy TIAnegative psych ROS   GI/Hepatic negative GI ROS, Neg liver ROS,   Endo/Other  negative endocrine ROSMorbid obesityBorderline diabletic  Renal/GU negative Renal ROS  negative genitourinary   Musculoskeletal   Abdominal (+) + obese,   Peds  Hematology negative hematology ROS (+)   Anesthesia Other Findings   Reproductive/Obstetrics negative OB ROS                            Anesthesia Physical Anesthesia Plan  ASA: IV  Anesthesia Plan: MAC   Post-op Pain Management:    Induction:   Airway Management Planned:   Additional Equipment:   Intra-op Plan:   Post-operative Plan:   Informed Consent: I have reviewed the patients History and Physical, chart, labs and discussed the procedure including the risks, benefits and alternatives for the proposed anesthesia with the patient or authorized representative who has indicated his/her understanding and acceptance.   Dental Advisory Given  Plan Discussed with: CRNA and Surgeon  Anesthesia Plan Comments:        Anesthesia Quick Evaluation

## 2015-07-26 NOTE — Discharge Instructions (Signed)
Esophagogastroduodenoscopy °Care After °Refer to this sheet in the next few weeks. These instructions provide you with information on caring for yourself after your procedure. Your caregiver may also give you more specific instructions. Your treatment has been planned according to current medical practices, but problems sometimes occur. Call your caregiver if you have any problems or questions after your procedure.  °HOME CARE INSTRUCTIONS °· Do not eat or drink anything until the numbing medicine (local anesthetic) has worn off and your gag reflex has returned. You will know that the local anesthetic has worn off when you can swallow comfortably. °· Do not drive for 12 hours after the procedure or as directed by your caregiver. °· Only take medicines as directed by your caregiver. °SEEK MEDICAL CARE IF:  °· You cannot stop coughing. °· You are not urinating at all or less than usual. °SEEK IMMEDIATE MEDICAL CARE IF: °· You have difficulty swallowing. °· You cannot eat or drink. °· You have worsening throat or chest pain. °· You have dizziness, lightheadedness, or you faint. °· You have nausea or vomiting. °· You have chills. °· You have a fever. °· You have severe abdominal pain. °· You have black, tarry, or bloody stools. °Document Released: 11/04/2012 Document Reviewed: 11/04/2012 °ExitCare® Patient Information ©2015 [employer]. This information is not intended to replace advice given to you by your health care provider. Make sure you discuss any questions you have with your health care provider. ° °

## 2015-07-26 NOTE — H&P (Signed)
Patient interval history reviewed.  Patient examined again.  There has been no change from documented H/P dated 07/11/15 (scanned into chart from our office) except as documented above.  Assessment:  1.  Cirrhosis.  Plan:  1.  Endoscopy for variceal screening. 2.  Risks (bleeding, infection, bowel perforation that could require surgery, sedation-related changes in cardiopulmonary systems), benefits (identification and possible treatment of source of symptoms, exclusion of certain causes of symptoms), and alternatives (watchful waiting, radiographic imaging studies, empiric medical treatment) of upper endoscopy (EGD) were explained to patient/family in detail and patient wishes to proceed.

## 2015-07-26 NOTE — Op Note (Signed)
[facility] [place], [id number]   ENDOSCOPY PROCEDURE REPORT  PATIENT: Ryan Burgess, Ryan Burgess  MR#: [id number] BIRTHDATE: [birth year] , 43  yrs. old GENDER: male ENDOSCOPIST: Arta Silence, MD REFERRED BY:  Donnie Coffin, M.D. PROCEDURE DATE:  08/12/2015 PROCEDURE:  EGD, screening ASA CLASS:     Class IV INDICATIONS:  Cirrhosis, variceal screening MEDICATIONS: Monitored anesthesia care TOPICAL ANESTHETIC:  DESCRIPTION OF PROCEDURE: After the risks benefits and alternatives of the procedure were thoroughly explained, informed consent was obtained.  The Pentax Gastroscope [id number] endoscope was introduced through the mouth and advanced to the second portion of the duodenum. The instrument was slowly withdrawn as the mucosa was fully examined. Estimated blood loss is zero unless otherwise noted in this procedure report.    Findings:  Normal esophagus; no esophageal, stricture, ring, mass, varices, or other pathology.  Mild diffuse portal hypertensive gastropathy.  Otherwise normal stomach; normal retroflexed view of cardia, specifically no evidence of gastric varices.  Normal duodenum to the second portion.              The scope was then withdrawn from the patient and the procedure completed.  COMPLICATIONS: There were no immediate complications.  ENDOSCOPIC IMPRESSION:     Mild portal hypertensive gastropathy.  No evidence of esophageal or gastric varices.  RECOMMENDATIONS:     1.  Watch for potential complications of procedure. 2.  Repeat endoscopy in 2-3 years for repeat variceal screening. 3.  Follow-up with [facility] in 6 months for ongoing cirrhosis management.  eSigned:  Arta Silence, MD 08/12/15 12:30 PM   CC:  CPT CODES: ICD CODES:  The ICD and CPT codes recommended by this software are interpretations from the data that the clinical staff has captured with the software.  The verification of the translation of this report to  the ICD and CPT codes and modifiers is the sole responsibility of the health care institution and practicing physician where this report was generated.  [employer]. will not be held responsible for the validity of the ICD and CPT codes included on this report.  AMA assumes no liability for data contained or not contained herein. CPT is a Designer, television/film set of the [employer].

## 2015-07-27 ENCOUNTER — Encounter (HOSPITAL_COMMUNITY): Payer: Self-pay | Admitting: Gastroenterology

## 2015-07-27 NOTE — Anesthesia Postprocedure Evaluation (Signed)
  Anesthesia Post-op Note  Patient: Ryan Burgess  Procedure(s) Performed: Procedure(s) (LRB): ESOPHAGOGASTRODUODENOSCOPY (EGD) WITH PROPOFOL (N/A)  Patient Location: PACU  Anesthesia Type: MAC  Level of Consciousness: awake and alert   Airway and Oxygen Therapy: Patient Spontanous Breathing  Post-op Pain: mild  Post-op Assessment: Post-op Vital signs reviewed, Patient's Cardiovascular Status Stable, Respiratory Function Stable, Patent Airway and No signs of Nausea or vomiting  Last Vitals:  Filed Vitals:   07/26/15 1240  BP: 163/90  Pulse: 74  Temp:   Resp: 17    Post-op Vital Signs: stable   Complications: No apparent anesthesia complications

## 2015-10-30 ENCOUNTER — Telehealth: Payer: Self-pay | Admitting: Neurology

## 2015-10-30 DIAGNOSIS — Z9989 Dependence on other enabling machines and devices: Principal | ICD-10-CM

## 2015-10-30 DIAGNOSIS — G4733 Obstructive sleep apnea (adult) (pediatric): Secondary | ICD-10-CM

## 2015-10-30 DIAGNOSIS — E662 Morbid (severe) obesity with alveolar hypoventilation: Secondary | ICD-10-CM

## 2015-10-30 NOTE — Telephone Encounter (Signed)
Spoke to Dr. Brett Fairy. She advised me to order an ONO on pt and bring him in for an appt to discuss results and lack of air and sleep.  Spoke to pt and explained this. He is agreeable to both the ONO and f/u appt. I advised him the order for the ONO will be sent to [facility] and they will send the results to [place]. F/u appt made for 12/20 at 10:30. Pt verbalized understanding to bring cpap and arrive 15 minutes early.

## 2015-10-30 NOTE — Telephone Encounter (Signed)
Patient called to advise he feels like he's not getting enough air at night. Patient states he feels tired like he's not getting enough sleep. Woke up this morning feeling really tired. Patient called [facility] CPAP supplier who advised patient to contact Dr. Brett Fairy, according to [facility] online machine check, everything looked good there, pressure was 4 and there were no leaks.

## 2015-11-19 ENCOUNTER — Emergency Department (HOSPITAL_COMMUNITY): Payer: BLUE CROSS/BLUE SHIELD

## 2015-11-19 ENCOUNTER — Encounter (HOSPITAL_COMMUNITY): Payer: Self-pay | Admitting: Emergency Medicine

## 2015-11-19 ENCOUNTER — Observation Stay (HOSPITAL_COMMUNITY)
Admission: EM | Admit: 2015-11-19 | Discharge: 2015-11-20 | Disposition: A | Discharge status: Home / Self Care | Payer: BLUE CROSS/BLUE SHIELD | Attending: Internal Medicine | Admitting: Internal Medicine

## 2015-11-19 DIAGNOSIS — K7200 Acute and subacute hepatic failure without coma: Secondary | ICD-10-CM | POA: Diagnosis not present

## 2015-11-19 DIAGNOSIS — D696 Thrombocytopenia, unspecified: Secondary | ICD-10-CM | POA: Diagnosis not present

## 2015-11-19 DIAGNOSIS — K7290 Hepatic failure, unspecified without coma: Secondary | ICD-10-CM | POA: Diagnosis present

## 2015-11-19 DIAGNOSIS — R410 Disorientation, unspecified: Secondary | ICD-10-CM | POA: Diagnosis not present

## 2015-11-19 DIAGNOSIS — G9340 Encephalopathy, unspecified: Secondary | ICD-10-CM | POA: Diagnosis not present

## 2015-11-19 DIAGNOSIS — Z7982 Long term (current) use of aspirin: Secondary | ICD-10-CM | POA: Insufficient documentation

## 2015-11-19 DIAGNOSIS — E7800 Pure hypercholesterolemia, unspecified: Secondary | ICD-10-CM | POA: Diagnosis not present

## 2015-11-19 DIAGNOSIS — G459 Transient cerebral ischemic attack, unspecified: Secondary | ICD-10-CM

## 2015-11-19 DIAGNOSIS — I10 Essential (primary) hypertension: Secondary | ICD-10-CM | POA: Insufficient documentation

## 2015-11-19 DIAGNOSIS — Z79899 Other long term (current) drug therapy: Secondary | ICD-10-CM | POA: Diagnosis not present

## 2015-11-19 DIAGNOSIS — K219 Gastro-esophageal reflux disease without esophagitis: Secondary | ICD-10-CM | POA: Diagnosis not present

## 2015-11-19 DIAGNOSIS — Z8673 Personal history of transient ischemic attack (TIA), and cerebral infarction without residual deficits: Secondary | ICD-10-CM | POA: Insufficient documentation

## 2015-11-19 DIAGNOSIS — IMO0002 Reserved for concepts with insufficient information to code with codable children: Secondary | ICD-10-CM | POA: Insufficient documentation

## 2015-11-19 DIAGNOSIS — R6889 Other general symptoms and signs: Secondary | ICD-10-CM

## 2015-11-19 DIAGNOSIS — R4182 Altered mental status, unspecified: Secondary | ICD-10-CM | POA: Diagnosis present

## 2015-11-19 DIAGNOSIS — Z6841 Body Mass Index (BMI) 40.0 and over, adult: Secondary | ICD-10-CM | POA: Insufficient documentation

## 2015-11-19 DIAGNOSIS — K7460 Unspecified cirrhosis of liver: Secondary | ICD-10-CM | POA: Diagnosis present

## 2015-11-19 DIAGNOSIS — I4891 Unspecified atrial fibrillation: Secondary | ICD-10-CM | POA: Diagnosis not present

## 2015-11-19 DIAGNOSIS — G4733 Obstructive sleep apnea (adult) (pediatric): Secondary | ICD-10-CM | POA: Insufficient documentation

## 2015-11-19 DIAGNOSIS — R531 Weakness: Secondary | ICD-10-CM | POA: Diagnosis not present

## 2015-11-19 DIAGNOSIS — K7682 Hepatic encephalopathy: Secondary | ICD-10-CM | POA: Diagnosis present

## 2015-11-19 DIAGNOSIS — Z87442 Personal history of urinary calculi: Secondary | ICD-10-CM | POA: Diagnosis not present

## 2015-11-19 DIAGNOSIS — R9431 Abnormal electrocardiogram [ECG] [EKG]: Secondary | ICD-10-CM

## 2015-11-19 LAB — CBC
HEMATOCRIT: 41.6 % (ref 39.0–52.0)
HEMOGLOBIN: 14.5 g/dL (ref 13.0–17.0)
MCH: 32.9 pg (ref 26.0–34.0)
MCHC: 34.9 g/dL (ref 30.0–36.0)
MCV: 94.3 fL (ref 78.0–100.0)
Platelets: 100 10*3/uL — ABNORMAL LOW (ref 150–400)
RBC: 4.41 MIL/uL (ref 4.22–5.81)
RDW: 14.4 % (ref 11.5–15.5)
WBC: 4.1 10*3/uL (ref 4.0–10.5)

## 2015-11-19 LAB — COMPREHENSIVE METABOLIC PANEL
ALK PHOS: 72 U/L (ref 38–126)
ALT: 32 U/L (ref 17–63)
AST: 54 U/L — ABNORMAL HIGH (ref 15–41)
Albumin: 2.7 g/dL — ABNORMAL LOW (ref 3.5–5.0)
Anion gap: 5 (ref 5–15)
BILIRUBIN TOTAL: 1.2 mg/dL (ref 0.3–1.2)
BUN: 12 mg/dL (ref 6–20)
CALCIUM: 8.7 mg/dL — AB (ref 8.9–10.3)
CO2: 23 mmol/L (ref 22–32)
CREATININE: 0.79 mg/dL (ref 0.61–1.24)
Chloride: 109 mmol/L (ref 101–111)
Glucose, Bld: 140 mg/dL — ABNORMAL HIGH (ref 65–99)
Potassium: 4.4 mmol/L (ref 3.5–5.1)
Sodium: 137 mmol/L (ref 135–145)
Total Protein: 6.6 g/dL (ref 6.5–8.1)

## 2015-11-19 LAB — URINALYSIS, ROUTINE W REFLEX MICROSCOPIC
Bilirubin Urine: NEGATIVE
GLUCOSE, UA: NEGATIVE mg/dL
HGB URINE DIPSTICK: NEGATIVE
Ketones, ur: NEGATIVE mg/dL
Leukocytes, UA: NEGATIVE
Nitrite: NEGATIVE
PH: 7 (ref 5.0–8.0)
PROTEIN: NEGATIVE mg/dL
Specific Gravity, Urine: 1.018 (ref 1.005–1.030)

## 2015-11-19 LAB — RAPID URINE DRUG SCREEN, HOSP PERFORMED
AMPHETAMINES: NOT DETECTED
BENZODIAZEPINES: NOT DETECTED
Barbiturates: NOT DETECTED
COCAINE: NOT DETECTED
OPIATES: NOT DETECTED
Tetrahydrocannabinol: NOT DETECTED

## 2015-11-19 LAB — I-STAT CHEM 8, ED
BUN: 14 mg/dL (ref 6–20)
CREATININE: 0.7 mg/dL (ref 0.61–1.24)
Calcium, Ion: 1.2 mmol/L (ref 1.12–1.23)
Chloride: 108 mmol/L (ref 101–111)
GLUCOSE: 127 mg/dL — AB (ref 65–99)
HCT: 42 % (ref 39.0–52.0)
HEMOGLOBIN: 14.3 g/dL (ref 13.0–17.0)
Potassium: 4.3 mmol/L (ref 3.5–5.1)
Sodium: 141 mmol/L (ref 135–145)
TCO2: 25 mmol/L (ref 0–100)

## 2015-11-19 LAB — I-STAT ARTERIAL BLOOD GAS, ED
Acid-base deficit: 2 mmol/L (ref 0.0–2.0)
Bicarbonate: 21.3 mEq/L (ref 20.0–24.0)
O2 Saturation: 98 %
PCO2 ART: 31.4 mmHg — AB (ref 35.0–45.0)
Patient temperature: 98.7
TCO2: 22 mmol/L (ref 0–100)
pH, Arterial: 7.439 (ref 7.350–7.450)
pO2, Arterial: 94 mmHg (ref 80.0–100.0)

## 2015-11-19 LAB — PROTIME-INR
INR: 1.29 (ref 0.00–1.49)
Prothrombin Time: 16.2 seconds — ABNORMAL HIGH (ref 11.6–15.2)

## 2015-11-19 LAB — DIFFERENTIAL
Basophils Absolute: 0.1 10*3/uL (ref 0.0–0.1)
Basophils Relative: 2 %
Eosinophils Absolute: 0.2 10*3/uL (ref 0.0–0.7)
Eosinophils Relative: 6 %
LYMPHS ABS: 1.4 10*3/uL (ref 0.7–4.0)
LYMPHS PCT: 34 %
MONO ABS: 0.6 10*3/uL (ref 0.1–1.0)
MONOS PCT: 15 %
NEUTROS ABS: 1.8 10*3/uL (ref 1.7–7.7)
Neutrophils Relative %: 43 %

## 2015-11-19 LAB — APTT: aPTT: 34 seconds (ref 24–37)

## 2015-11-19 LAB — I-STAT TROPONIN, ED: TROPONIN I, POC: 0 ng/mL (ref 0.00–0.08)

## 2015-11-19 LAB — BRAIN NATRIURETIC PEPTIDE: B NATRIURETIC PEPTIDE 5: 21.9 pg/mL (ref 0.0–100.0)

## 2015-11-19 LAB — ETHANOL

## 2015-11-19 LAB — CBG MONITORING, ED: Glucose-Capillary: 132 mg/dL — ABNORMAL HIGH (ref 65–99)

## 2015-11-19 MED ORDER — SODIUM CHLORIDE 0.9 % IV BOLUS (SEPSIS)
1000.0000 mL | Freq: Once | INTRAVENOUS | Status: AC
  Administered 2015-11-19: 1000 mL via INTRAVENOUS

## 2015-11-19 MED ORDER — ACETAMINOPHEN 325 MG PO TABS
650.0000 mg | ORAL_TABLET | Freq: Once | ORAL | Status: AC
  Administered 2015-11-19: 650 mg via ORAL
  Filled 2015-11-19: qty 2

## 2015-11-19 NOTE — ED Notes (Signed)
RT at bedside for Blood gas and then pt being transported to CT

## 2015-11-19 NOTE — ED Notes (Addendum)
Pt reports feeling confused and fatigue onset today. Pt last normal yesterday. Family reports pt has balance issues, trouble completing his thoughts.

## 2015-11-19 NOTE — ED Notes (Signed)
Keshia from MRI called and states pt is unable to fit in MRI machine.  Dr. Vanita Panda notified and pt had MRI in Feb.  Wife reported 40 lb weight loss since then.  Called Melia in MRI and Keshia to call RN back.

## 2015-11-19 NOTE — ED Provider Notes (Signed)
CSN: [id number]     Arrival date & time 11/19/15  1228 History   First MD Initiated Contact with Patient 11/19/15 [phone]     Chief Complaint  Patient presents with  . Altered Mental Status  . Fatigue     (Consider location/radiation/quality/duration/timing/severity/associated sxs/prior Treatment) HPI Patient presents with his wife who assists with the history of present illness. It seems as though the patient was in his usual state of health until today. Exact time of onset is unclear, but over the past 8 hours, the patient has been confused, hypersomnolent, generally weak. History is provided by the wife, but the patient confirms the story. He feels generally weak, without asymmetric weakness, pain anywhere. When answer questions he is perseverative, repetitive, disoriented to time. According to the patient's wife he has sleep apnea, uses, has recently been having issues with this. He also has a history of prior TIA, from the past year. Level V caveat secondary to confusion.   Past Medical History  Diagnosis Date  . GERD (gastroesophageal reflux disease)   . Obesity   . Hypertension   . Snoring   . TIA (transient ischemic attack)   . High cholesterol   . Sleep apnea     uses CPAP  . Kidney stones 01/2015    UTI   Past Surgical History  Procedure Laterality Date  . Mouth surgery    . Tee without cardioversion N/A 04/03/2015    Procedure: TRANSESOPHAGEAL ECHOCARDIOGRAM (TEE);  Surgeon: Adrian Prows, MD;  Location: [facility] ENDOSCOPY;  Service: Cardiovascular;  Laterality: N/A;  . Esophagogastroduodenoscopy (egd) with propofol N/A 07/26/2015    Procedure: ESOPHAGOGASTRODUODENOSCOPY (EGD) WITH PROPOFOL;  Surgeon: Arta Silence, MD;  Location: [facility] ENDOSCOPY;  Service: Endoscopy;  Laterality: N/A;   Family History  Problem Relation Age of Onset  . Leukemia Mother   . Heart disease Maternal Grandmother   . Heart disease Paternal Grandmother   . Diabetes Father   . Dementia Paternal  Grandmother   . COPD Maternal Grandmother    Social History  Substance Use Topics  . Smoking status: Never Smoker   . Smokeless tobacco: Never Used  . Alcohol Use: 0.6 - 1.2 oz/week    1-2 Standard drinks or equivalent per week     Comment: every three months    Review of Systems  Unable to perform ROS: Mental status change      Allergies  Review of patient's allergies indicates no known allergies.  Home Medications   Prior to Admission medications   Medication Sig Start Date End Date Taking? Authorizing Provider  aspirin EC 81 MG EC tablet Take 1 tablet (81 mg total) by mouth daily. 01/10/15   Ripudeep Krystal Eaton, MD  atorvastatin (LIPITOR) 20 MG tablet Take 1 tablet (20 mg total) by mouth at bedtime. 01/10/15   Ripudeep Krystal Eaton, MD  blood glucose meter kit and supplies KIT Dispense based on patient and insurance preference. Use up to four times daily as directed. (FOR ICD-9 250.00, 250.01). 01/11/15   Ripudeep K Rai, MD  DEXILANT 60 MG capsule Take 60 mg by mouth daily.  04/22/15   Historical Provider, MD  furosemide (LASIX) 20 MG tablet Take 1 tablet (20 mg total) by mouth daily. 01/10/15   Ripudeep Krystal Eaton, MD  loratadine (CLARITIN) 10 MG tablet Take 10 mg by mouth daily as needed for allergies.    Historical Provider, MD  Multiple Vitamin (MULTIVITAMIN WITH MINERALS) TABS tablet Take 1 tablet by mouth daily.  Historical Provider, MD  phenylephrine (SUDAFED PE) 10 MG TABS tablet Take 10 mg by mouth every 4 (four) hours as needed (for congestion).     Historical Provider, MD   BP 138/43 mmHg  Pulse 84  Temp(Src) 98.7 F (37.1 C) (Oral)  Resp 25  Ht _0  (1.956 m)  Wt 481 lb 3 oz (218.265 kg)  BMI 57.05 kg/m2  SpO2 99% Physical Exam  Constitutional: He appears well-nourished. He appears listless. No distress.  Morbidly obese male seemingly comfortable, falsely quickly, but awakens with minimal stimuli.   HENT:  Head: Normocephalic and atraumatic.  Eyes: Conjunctivae and EOM are  normal.  Cardiovascular: Normal rate and regular rhythm.   Pulmonary/Chest: Effort normal. No stridor. No respiratory distress.  Abdominal: He exhibits no distension.  Musculoskeletal: He exhibits no edema.  Neurological: He appears listless. He displays tremor. No cranial nerve deficit. He displays no seizure activity.  Patient is tremulous, with all cerebellar testing  Skin: Skin is warm and dry.  Psychiatric: He has a normal mood and affect. He is slowed and withdrawn. Cognition and memory are impaired. He is inattentive.  Nursing note and vitals reviewed.   ED Course  Procedures (including critical care time) Labs Review Labs Reviewed  PROTIME-INR - Abnormal; Notable for the following:    Prothrombin Time 16.2 (*)    All other components within normal limits  CBC - Abnormal; Notable for the following:    Platelets 100 (*)    All other components within normal limits  COMPREHENSIVE METABOLIC PANEL - Abnormal; Notable for the following:    Glucose, Bld 140 (*)    Calcium 8.7 (*)    Albumin 2.7 (*)    AST 54 (*)    All other components within normal limits  CBG MONITORING, ED - Abnormal; Notable for the following:    Glucose-Capillary 132 (*)    All other components within normal limits  I-STAT CHEM 8, ED - Abnormal; Notable for the following:    Glucose, Bld 127 (*)    All other components within normal limits  I-STAT ARTERIAL BLOOD GAS, ED - Abnormal; Notable for the following:    pCO2 arterial 31.4 (*)    All other components within normal limits  APTT  DIFFERENTIAL  URINALYSIS, ROUTINE W REFLEX MICROSCOPIC (NOT AT [facility])  BRAIN NATRIURETIC PEPTIDE  BLOOD GAS, ARTERIAL  URINE RAPID DRUG SCREEN, HOSP PERFORMED  I-STAT TROPOININ, ED    Imaging Review Dg Chest 2 View  11/19/2015  CLINICAL DATA:  Confusion and weakness EXAM: CHEST  2 VIEW COMPARISON:  01/09/2015 FINDINGS: The heart size and mediastinal contours are within normal limits. Both lungs are clear. There is  degenerative disc disease noted within the thoracic spine. IMPRESSION: 1. No acute cardiopulmonary abnormalities. Electronically Signed   By: Kerby Moors M.D.   On: 11/19/2015 17:40   Ct Head Wo Contrast  11/19/2015  CLINICAL DATA:  Pt reports feeling confused and fatigue onset today. Pt last normal yesterday. Family reports pt has balance issues, trouble completing his thoughts. EXAM: CT HEAD WITHOUT CONTRAST TECHNIQUE: Contiguous axial images were obtained from the base of the skull through the vertex without intravenous contrast. COMPARISON:  01/10/2015 FINDINGS: The ventricles are normal in size and configuration. There are no parenchymal masses or mass effect. There is no evidence of an infarct. No extra-axial masses or abnormal fluid collections. There is no intracranial hemorrhage. Visualized sinuses and mastoid air cells are clear. No skull lesion. IMPRESSION: Normal unenhanced CT  scan of the brain. Electronically Signed   By: Lajean Manes M.D.   On: 11/19/2015 19:15   Patient was intolerant of MRI, given his size.  After discussion with our neurology colleagues, we agreed to perform CT scan. I discussed the case with neurology at length, and given concern for his persistent disorientation, inability to obtain MRI, the patient will be admitted, once the remainder of his emergency department labs are available.   I have personally reviewed and evaluated these images and lab results as part of my medical decision-making.   EKG Interpretation   Date/Time:  _0 :07 PM Patient eventually required catheterization for urine sample.    MDM  This obese male presents with new disorientation, fatigue. Patient does have history of prior TIA, and on exam is disoriented, perseverant. Patient takes only aspirin for stroke prophylaxis.  Patient was unable to complete ED portion of stroke evaluation given his intolerance of MRI scanner. After discussion with our neurology colleagues, the patient will be admitted for further evaluation, management  Carmin Muskrat, MD 11/19/15 2215

## 2015-11-19 NOTE — H&P (Addendum)
[facility] History and Physical  Ryan Burgess JJK:[id number] DOB: [birth year] DOA: 11/19/2015  Referring physician: ED PCP: Donnie Coffin, MD   Chief Complaint: fogginess  HPI:  Ryan Burgess is a 44 year old male with past medical history significant for morbid obesity OSA on CPAP; who presents with disorientation and fatigue. Symptoms started this morning upon awakening his wife is present at bedside and helps provide additional history. Patient initially awoke this morning with some initial fogginess as well as noted that he got progressively worse throughout the day until she decided to bring him in sometime around noon. She states that following his included him not answering questions correctly. For example patient is supposed to state that he is at [facility] however, patient states that he's [place] when asked where he is currently located. Last time patient had similar issues was back in February of this year when he was diagnosed with possibly having a TIA. Patient suffers from claustrophobia and was not able to have a MRI. He denies any new medications, fever, chills, abdominal pain, chest pain, shortness of breath, diarrhea, and/or constipation. Denies any previous history of bleeding.  Upon admission into the emergency department patient is reported to have a negative CT of the brain, normal ABG and initial lab work.  Review of Systems  Constitutional: Positive for malaise/fatigue. Negative for fever and weight loss.  HENT: Negative for ear pain and hearing loss.   Eyes: Negative for double vision and photophobia.  Respiratory: Negative for hemoptysis and sputum production.   Cardiovascular: Negative for palpitations and orthopnea.  Gastrointestinal: Negative for vomiting and abdominal pain.  Genitourinary: Negative for urgency and frequency.  Musculoskeletal: Positive for back pain and joint pain.  Neurological: Negative for sensory change, speech change and loss of  consciousness.  Psychiatric/Behavioral: Positive for memory loss. Negative for substance abuse.     Past Medical History  Diagnosis Date  . GERD (gastroesophageal reflux disease)   . Obesity   . Hypertension   . Snoring   . TIA (transient ischemic attack)   . High cholesterol   . Sleep apnea     uses CPAP  . Kidney stones 01/2015    UTI     Past Surgical History  Procedure Laterality Date  . Mouth surgery    . Tee without cardioversion N/A 04/03/2015    Procedure: TRANSESOPHAGEAL ECHOCARDIOGRAM (TEE);  Surgeon: Adrian Prows, MD;  Location: [facility] ENDOSCOPY;  Service: Cardiovascular;  Laterality: N/A;  . Esophagogastroduodenoscopy (egd) with propofol N/A 07/26/2015    Procedure: ESOPHAGOGASTRODUODENOSCOPY (EGD) WITH PROPOFOL;  Surgeon: Arta Silence, MD;  Location: [facility] ENDOSCOPY;  Service: Endoscopy;  Laterality: N/A;      Social History:  reports that he has never smoked. He has never used smokeless tobacco. He reports that he drinks about 0.6 - 1.2 oz of alcohol per week. He reports that he does not use illicit drugs. Where does patient live--home  and with whom if at home? Wife Can patient participate in ADLs? Yes  No Known Allergies  Family History  Problem Relation Age of Onset  . Leukemia Mother   . Heart disease Maternal Grandmother   . Heart disease Paternal Grandmother   . Diabetes Father   . Dementia Paternal Grandmother   . COPD Maternal Grandmother       Prior to Admission medications   Medication Sig Start Date End Date Taking? Authorizing Provider  acetaminophen (TYLENOL) 500 MG tablet Take 1,000 mg by mouth every 6 (six) hours as needed  for headache.   Yes Historical Provider, MD  aspirin EC 81 MG EC tablet Take 1 tablet (81 mg total) by mouth daily. 01/10/15  Yes Ripudeep Krystal Eaton, MD  atorvastatin (LIPITOR) 20 MG tablet Take 1 tablet (20 mg total) by mouth at bedtime. 01/10/15  Yes Ripudeep Krystal Eaton, MD  dexlansoprazole (DEXILANT) 60 MG capsule Take 60 mg by mouth  daily.   Yes Historical Provider, MD  furosemide (LASIX) 20 MG tablet Take 1 tablet (20 mg total) by mouth daily. 01/10/15  Yes Ripudeep Krystal Eaton, MD  loratadine (CLARITIN) 10 MG tablet Take 10 mg by mouth daily as needed for allergies.   Yes Historical Provider, MD  Multiple Vitamin (MULTIVITAMIN WITH MINERALS) TABS tablet Take 1 tablet by mouth daily.   Yes Historical Provider, MD  phenylephrine (SUDAFED PE) 10 MG TABS tablet Take 10 mg by mouth every 4 (four) hours as needed (for congestion).    Yes Historical Provider, MD  blood glucose meter kit and supplies KIT Dispense based on patient and insurance preference. Use up to four times daily as directed. (FOR ICD-9 250.00, 250.01). 01/11/15   Ripudeep Krystal Eaton, MD     Physical Exam: Filed Vitals:   11/19/15 2030 11/19/15 2045 11/19/15 2145 11/19/15 2200  BP: 141/53 157/39 139/47 129/70  Pulse: 68 84 79 79  Temp:      TempSrc:      Resp: _0 Height:      Weight:      SpO2: 96% 99% 98% 99%     Constitutional: Vital signs reviewed. Patient is a obese male in no acute distress and cooperative with exam. Alert and oriented to person and requires multiple attempts to answer place correctly Head: Normocephalic and atraumatic  Ear: TM normal bilaterally  Mouth: no erythema or exudates, MMM  Eyes: PERRL, EOMI, conjunctivae normal, No scleral icterus.  Neck: Supple, Trachea midline normal ROM, No JVD, mass, thyromegaly, or carotid bruit present.  Cardiovascular: RRR, S1 normal, S2 normal, no MRG, pulses symmetric and intact bilaterally  Pulmonary/Chest: CTAB, no wheezes, rales, or rhonchi  Abdominal: Soft. Non-tender, non-distended, bowel sounds are normal, no masses, organomegaly, or guarding present.  GU: no CVA tenderness Musculoskeletal: No joint deformities, erythema, or stiffness, ROM full and no nontender Ext: no edema and no cyanosis, pulses palpable bilaterally (DP and PT)  Hematology: no cervical, inginal, or axillary adenopathy.   Neurological: Alert, Strenght is normal and symmetric bilaterally, cranial nerve II-XII are grossly intact, no focal motor deficit, sensory intact to light touch bilaterally.  Skin: Warm, dry and intact. No rash, cyanosis, or clubbing.  Psychiatric: Normal mood and affect. speech and behavior is normal. Judgment and thought content normal. Cognition and memory are normal.      Data Review   Micro Results No results found for this or any previous visit (from the past 240 hour(s)).  Radiology Reports Dg Chest 2 View  11/19/2015  CLINICAL DATA:  Confusion and weakness EXAM: CHEST  2 VIEW COMPARISON:  01/09/2015 FINDINGS: The heart size and mediastinal contours are within normal limits. Both lungs are clear. There is degenerative disc disease noted within the thoracic spine. IMPRESSION: 1. No acute cardiopulmonary abnormalities. Electronically Signed   By: Kerby Moors M.D.   On: 11/19/2015 17:40   Ct Head Wo Contrast  11/19/2015  CLINICAL DATA:  Pt reports feeling confused and fatigue onset today. Pt last normal yesterday. Family reports pt has balance issues, trouble completing his thoughts. EXAM:  CT HEAD WITHOUT CONTRAST TECHNIQUE: Contiguous axial images were obtained from the base of the skull through the vertex without intravenous contrast. COMPARISON:  01/10/2015 FINDINGS: The ventricles are normal in size and configuration. There are no parenchymal masses or mass effect. There is no evidence of an infarct. No extra-axial masses or abnormal fluid collections. There is no intracranial hemorrhage. Visualized sinuses and mastoid air cells are clear. No skull lesion. IMPRESSION: Normal unenhanced CT scan of the brain. Electronically Signed   By: Lajean Manes M.D.   On: 11/19/2015 19:15     CBC  Recent Labs Lab 11/19/15 1248 11/19/15 1257  WBC 4.1  --   HGB 14.5 14.3  HCT 41.6 42.0  PLT 100*  --   MCV 94.3  --   MCH 32.9  --   MCHC 34.9  --   RDW 14.4  --   LYMPHSABS 1.4  --    MONOABS 0.6  --   EOSABS 0.2  --   BASOSABS 0.1  --     Chemistries   Recent Labs Lab 11/19/15 1248 11/19/15 1257  NA 137 141  K 4.4 4.3  CL 109 108  CO2 23  --   GLUCOSE 140* 127*  BUN 12 14  CREATININE 0.79 0.70  CALCIUM 8.7*  --   AST 54*  --   ALT 32  --   ALKPHOS 72  --   BILITOT 1.2  --    ------------------------------------------------------------------------------------------------------------------ estimated creatinine clearance is 234.7 mL/min (by C-G formula based on Cr of 0.7). ------------------------------------------------------------------------------------------------------------------ No results for input(s): HGBA1C in the last 72 hours. ------------------------------------------------------------------------------------------------------------------ No results for input(s): CHOL, HDL, LDLCALC, TRIG, CHOLHDL, LDLDIRECT in the last 72 hours. ------------------------------------------------------------------------------------------------------------------ No results for input(s): TSH, T4TOTAL, T3FREE, THYROIDAB in the last 72 hours.  Invalid input(s): FREET3 ------------------------------------------------------------------------------------------------------------------ No results for input(s): VITAMINB12, FOLATE, FERRITIN, TIBC, IRON, RETICCTPCT in the last 72 hours.  Coagulation profile  Recent Labs Lab 11/19/15 1248  INR 1.29    No results for input(s): DDIMER in the last 72 hours.  Cardiac Enzymes No results for input(s): CKMB, TROPONINI, MYOGLOBIN in the last 168 hours.  Invalid input(s): CK ------------------------------------------------------------------------------------------------------------------ Invalid input(s): POCBNP   CBG:  Recent Labs Lab 11/19/15 1242  GLUCAP 132*       EKG: Independently reviewed. Showing abnormal EKG with thought of possible atrial fibrillation   Assessment/Plan Principal Problem: Acute  encephalopathy: Patient with symptoms of being altered starting this morning unable to answer questions that he normally would be able to. Initial CT scan negative for any acute abnormalities.UDS negative as well. Previously having similar symptoms earlier in the year reports being told was possibly a TIA. Initial lab work including blood gas were within normal limits. - Admitting patient to a telemetry bed - neuro checks every 2 hours - Neurology consulted recommending EEG  - question benefit of MRI - discontinued phenylephrine  Obstructive sleep apnea on CPAP -Continue CPAPper respiratory therapy    HTN (hypertension) - continue Lasix    High cholesterol - continue atorvastatin and asa    Morbid obesity with body mass index of 50.0-59.9 in adult [facility]) - continue to counsel patient on need of weight loss.    Abnormal EKG: initially read as possible atrial fibrillation but noted to be likely secondary to artifact. -Recheck EKG in a.m.  Thrombocytopenia: Platelet count was initially noted to be 100. -Recheck platelet count in a.m.  Code Status:   full Family Communication: bedside Disposition Plan: admit  Total time spent 55 minutes.Greater than 50% of this time was spent in counseling, explanation of diagnosis, planning of further management, and coordination of care  [facility] Pager [phone]  If 7PM-7AM, please contact night-coverage [email or web address] Password [facility] 11/19/2015, 10:51 PM

## 2015-11-19 NOTE — ED Notes (Signed)
Pt unable to tolerate MRI and asked for the test to be cancelled.

## 2015-11-19 NOTE — Consult Note (Signed)
NEURO HOSPITALIST CONSULT NOTE   Referring physician: Dr Vanita Panda Reason for Consult: acute confusion  HPI:                                                                                                                                          Ryan Burgess is an 44 y.o. male with a past medical history significant for HTN, HLD, OSA on CPAP, obesity, TIA, and kidney stones, comes in accompanied by his wife for evaluation of confusion. Wife is at the bedside and stated that her husband was fine last night but this morning staated acting confused with difficulty finding words out. She said that patient had similar episode of confusion that lasted 24 or 36 hours last February and was evaluated here at [facility] and diagnosed as TIA. He complains of weakness in the right side but denies fever, recent medical illness, new medications, change in his CPAP settings, falls, HA, vertigo, double vision, numbness, slurred speech, language or vision impairment. Patient was not able to fit into the MRI machine. CT brain is pending. Available serologies were reviewed and are unrevealing. ABG normal  Past Medical History  Diagnosis Date  . GERD (gastroesophageal reflux disease)   . Obesity   . Hypertension   . Snoring   . TIA (transient ischemic attack)   . High cholesterol   . Sleep apnea     uses CPAP  . Kidney stones 01/2015    UTI    Past Surgical History  Procedure Laterality Date  . Mouth surgery    . Tee without cardioversion N/A 04/03/2015    Procedure: TRANSESOPHAGEAL ECHOCARDIOGRAM (TEE);  Surgeon: Adrian Prows, MD;  Location: [facility] ENDOSCOPY;  Service: Cardiovascular;  Laterality: N/A;  . Esophagogastroduodenoscopy (egd) with propofol N/A 07/26/2015    Procedure: ESOPHAGOGASTRODUODENOSCOPY (EGD) WITH PROPOFOL;  Surgeon: Arta Silence, MD;  Location: [facility] ENDOSCOPY;  Service: Endoscopy;  Laterality: N/A;    Family History  Problem Relation Age of Onset  . Leukemia Mother    . Heart disease Maternal Grandmother   . Heart disease Paternal Grandmother   . Diabetes Father   . Dementia Paternal Grandmother   . COPD Maternal Grandmother     Family History: no epilepsy, brain tumor, or MS   Social History:  reports that he has never smoked. He has never used smokeless tobacco. He reports that he drinks about 0.6 - 1.2 oz of alcohol per week. He reports that he does not use illicit drugs.  No Known Allergies  MEDICATIONS:  I have reviewed the patient's current medications.   ROS:                                                                                                                                       History obtained from wife, chart review and the patient  General ROS: negative for - chills, fever, night sweats, or weight loss Psychological ROS: negative for - behavioral disorder, hallucinations, memory difficulties, mood swings or suicidal ideation Ophthalmic ROS: negative for - blurry vision, double vision, eye pain or loss of vision ENT ROS: negative for - epistaxis, nasal discharge, oral lesions, sore throat, tinnitus or vertigo Allergy and Immunology ROS: negative for - hives or itchy/watery eyes Hematological and Lymphatic ROS: negative for - bleeding problems, bruising or swollen lymph nodes Endocrine ROS: negative for - galactorrhea, hair pattern changes, polydipsia/polyuria or temperature intolerance Respiratory ROS: negative for - cough, hemoptysis, shortness of breath or wheezing Cardiovascular ROS: negative for - chest pain, edema or irregular heartbeat Gastrointestinal ROS: negative for - abdominal pain, diarrhea, hematemesis, nausea/vomiting or stool incontinence Genito-Urinary ROS: negative for - dysuria, hematuria, incontinence or urinary frequency/urgency Musculoskeletal ROS: negative for - joint swelling Neurological  ROS: as noted in HPI Dermatological ROS: negative for rash and skin lesion changes   Physical exam:  Constitutional: morbidly obese male in no apparent distress. Blood pressure 146/67, pulse 72, temperature 98.7 F (37.1 C), temperature source Oral, resp. rate 12, height '6\' 5"'$  (1.956 m), weight 218.265 kg (481 lb 3 oz), SpO2 98 %. Eyes: no jaundice or exophthalmos.  Head: normocephalic. Neck: supple, no bruits, no JVD. Cardiac: no murmurs. Lungs: clear. Abdomen: soft, no tender, no mass. Extremities: no edema, clubbing, or cyanosis.  Skin: no rash  Neurologic Examination:                                                                                                      General: NAD Mental Status: Somnolent, disoriented to place-year-month-day.  Speech fluent without evidence of aphasia.  Able to follow 3 step commands without difficulty. Cranial Nerves: II: Discs flat bilaterally; Visual fields grossly normal, pupils equal, round, reactive to light and accommodation III,IV, VI: ptosis not present, extra-ocular motions intact bilaterally V,VII: smile symmetric, facial light touch sensation normal bilaterally VIII: hearing normal bilaterally IX,X: uvula rises symmetrically XI: bilateral shoulder shrug XII: midline tongue extension without atrophy or fasciculations Motor: Right : Upper extremity   5/5    Left:     Upper extremity  5/5  Lower extremity   5/5     Lower extremity   5/5 Tone and bulk:normal tone throughout; no atrophy noted Sensory: Pinprick and light touch intact throughout, bilaterally Deep Tendon Reflexes:  Unable to elicit due to body habitus Plantars: Right: downgoing   Left: downgoing Cerebellar: normal finger-to-nose, heel-to-shin no tested Gait:  No tested due to multiple leads    Lab Results  Component Value Date/Time   CHOL 172 01/10/2015 06:45 AM    Results for orders placed or performed during the hospital encounter of 11/19/15 (from the past  48 hour(s))  CBG monitoring, ED     Status: Abnormal   Collection Time: 11/19/15 12:42 PM  Result Value Ref Range   Glucose-Capillary 132 (H) 65 - 99 mg/dL  Protime-INR     Status: Abnormal   Collection Time: 11/19/15 12:48 PM  Result Value Ref Range   Prothrombin Time 16.2 (H) 11.6 - 15.2 seconds   INR 1.29 0.00 - 1.49  APTT     Status: None   Collection Time: 11/19/15 12:48 PM  Result Value Ref Range   aPTT 34 24 - 37 seconds  CBC     Status: Abnormal   Collection Time: 11/19/15 12:48 PM  Result Value Ref Range   WBC 4.1 4.0 - 10.5 K/uL   RBC 4.41 4.22 - 5.81 MIL/uL   Hemoglobin 14.5 13.0 - 17.0 g/dL   HCT 41.6 39.0 - 52.0 %   MCV 94.3 78.0 - 100.0 fL   MCH 32.9 26.0 - 34.0 pg   MCHC 34.9 30.0 - 36.0 g/dL   RDW 14.4 11.5 - 15.5 %   Platelets 100 (L) 150 - 400 K/uL    Comment: SPECIMEN CHECKED FOR CLOTS REPEATED TO VERIFY PLATELET COUNT CONFIRMED BY SMEAR   Differential     Status: None   Collection Time: 11/19/15 12:48 PM  Result Value Ref Range   Neutrophils Relative % 43 %   Neutro Abs 1.8 1.7 - 7.7 K/uL   Lymphocytes Relative 34 %   Lymphs Abs 1.4 0.7 - 4.0 K/uL   Monocytes Relative 15 %   Monocytes Absolute 0.6 0.1 - 1.0 K/uL   Eosinophils Relative 6 %   Eosinophils Absolute 0.2 0.0 - 0.7 K/uL   Basophils Relative 2 %   Basophils Absolute 0.1 0.0 - 0.1 K/uL  Comprehensive metabolic panel     Status: Abnormal   Collection Time: 11/19/15 12:48 PM  Result Value Ref Range   Sodium 137 135 - 145 mmol/L   Potassium 4.4 3.5 - 5.1 mmol/L   Chloride 109 101 - 111 mmol/L   CO2 23 22 - 32 mmol/L   Glucose, Bld 140 (H) 65 - 99 mg/dL   BUN 12 6 - 20 mg/dL   Creatinine, Ser 0.79 0.61 - 1.24 mg/dL   Calcium 8.7 (L) 8.9 - 10.3 mg/dL   Total Protein 6.6 6.5 - 8.1 g/dL   Albumin 2.7 (L) 3.5 - 5.0 g/dL   AST 54 (H) 15 - 41 U/L   ALT 32 17 - 63 U/L   Alkaline Phosphatase 72 38 - 126 U/L   Total Bilirubin 1.2 0.3 - 1.2 mg/dL   GFR calc non Af Amer >60 >60 mL/min   GFR  calc Af Amer >60 >60 mL/min    Comment: (NOTE) The eGFR has been calculated using the CKD EPI equation. This calculation has not been validated in all clinical situations. eGFR's persistently <60 mL/min signify possible Chronic Kidney Disease.  Anion gap 5 5 - 15  Brain natriuretic peptide     Status: None   Collection Time: 11/19/15 12:48 PM  Result Value Ref Range   B Natriuretic Peptide 21.9 0.0 - 100.0 pg/mL  I-stat troponin, ED (not at [facility], [facility])     Status: None   Collection Time: 11/19/15 12:56 PM  Result Value Ref Range   Troponin i, poc 0.00 0.00 - 0.08 ng/mL   Comment 3            Comment: Due to the release kinetics of cTnI, a negative result within the first hours of the onset of symptoms does not rule out myocardial infarction with certainty. If myocardial infarction is still suspected, repeat the test at appropriate intervals.   I-Stat Chem 8, ED  (not at [facility], [facility])     Status: Abnormal   Collection Time: 11/19/15 12:57 PM  Result Value Ref Range   Sodium 141 135 - 145 mmol/L   Potassium 4.3 3.5 - 5.1 mmol/L   Chloride 108 101 - 111 mmol/L   BUN 14 6 - 20 mg/dL   Creatinine, Ser 0.70 0.61 - 1.24 mg/dL   Glucose, Bld 127 (H) 65 - 99 mg/dL   Calcium, Ion 1.20 1.12 - 1.23 mmol/L   TCO2 25 0 - 100 mmol/L   Hemoglobin 14.3 13.0 - 17.0 g/dL   HCT 42.0 39.0 - 52.0 %    Dg Chest 2 View  11/19/2015  CLINICAL DATA:  Confusion and weakness EXAM: CHEST  2 VIEW COMPARISON:  01/09/2015 FINDINGS: The heart size and mediastinal contours are within normal limits. Both lungs are clear. There is degenerative disc disease noted within the thoracic spine. IMPRESSION: 1. No acute cardiopulmonary abnormalities. Electronically Signed   By: Kerby Moors M.D.   On: 11/19/2015 17:40   Assessment/Plan: 44 y/o morbidly obese male with HTN, HLD, OSA on CPAP, episode of confusion x 24-36 hours last February, comes in with recurrent confusion without associated lateralizing neurological  findings on exam, unimpressive serologies so far including normal ABG. CT brain pending. At this time, no definite cause for his acute confusion.  Unfortunately, he was not ab le to fit into the MRI machine. Awaiting CT head results.  EEG but doubt seizures. Check UDS. Will follow up.  Dorian Pod, MD 11/19/2015, 6:48 PM  [facility]

## 2015-11-19 NOTE — ED Notes (Signed)
Unable to obtain urine sample.  Dr. Vanita Panda notified and orders received.

## 2015-11-19 NOTE — ED Notes (Signed)
Spoke with Burna Mortimer in [place] and ? Arm pads on MRI are larger than the previous one.  States pt said it was not enough room in MRI machine.  States table would not advance all the way.  She will talk with pt and determine if they are able to complete exam.  Dr. Vanita Panda updated.

## 2015-11-19 NOTE — ED Notes (Signed)
Transported to MRI

## 2015-11-20 ENCOUNTER — Observation Stay (HOSPITAL_COMMUNITY): Payer: BLUE CROSS/BLUE SHIELD

## 2015-11-20 DIAGNOSIS — K7682 Hepatic encephalopathy: Secondary | ICD-10-CM | POA: Diagnosis present

## 2015-11-20 DIAGNOSIS — K7200 Acute and subacute hepatic failure without coma: Secondary | ICD-10-CM | POA: Diagnosis not present

## 2015-11-20 DIAGNOSIS — D696 Thrombocytopenia, unspecified: Secondary | ICD-10-CM | POA: Insufficient documentation

## 2015-11-20 DIAGNOSIS — G9340 Encephalopathy, unspecified: Secondary | ICD-10-CM | POA: Diagnosis not present

## 2015-11-20 DIAGNOSIS — K7460 Unspecified cirrhosis of liver: Secondary | ICD-10-CM

## 2015-11-20 DIAGNOSIS — K7290 Hepatic failure, unspecified without coma: Secondary | ICD-10-CM | POA: Diagnosis not present

## 2015-11-20 DIAGNOSIS — I10 Essential (primary) hypertension: Secondary | ICD-10-CM | POA: Diagnosis not present

## 2015-11-20 DIAGNOSIS — E7800 Pure hypercholesterolemia, unspecified: Secondary | ICD-10-CM | POA: Diagnosis not present

## 2015-11-20 DIAGNOSIS — Z8673 Personal history of transient ischemic attack (TIA), and cerebral infarction without residual deficits: Secondary | ICD-10-CM | POA: Diagnosis not present

## 2015-11-20 LAB — AMMONIA: Ammonia: 167 umol/L — ABNORMAL HIGH (ref 9–35)

## 2015-11-20 LAB — CBC
HEMATOCRIT: 35.8 % — AB (ref 39.0–52.0)
HEMOGLOBIN: 12.4 g/dL — AB (ref 13.0–17.0)
MCH: 32.6 pg (ref 26.0–34.0)
MCHC: 34.6 g/dL (ref 30.0–36.0)
MCV: 94.2 fL (ref 78.0–100.0)
Platelets: 95 10*3/uL — ABNORMAL LOW (ref 150–400)
RBC: 3.8 MIL/uL — ABNORMAL LOW (ref 4.22–5.81)
RDW: 14.9 % (ref 11.5–15.5)
WBC: 3.8 10*3/uL — ABNORMAL LOW (ref 4.0–10.5)

## 2015-11-20 LAB — GLUCOSE, CAPILLARY
GLUCOSE-CAPILLARY: 125 mg/dL — AB (ref 65–99)
GLUCOSE-CAPILLARY: 78 mg/dL (ref 65–99)
GLUCOSE-CAPILLARY: 95 mg/dL (ref 65–99)
Glucose-Capillary: 96 mg/dL (ref 65–99)
Glucose-Capillary: 95 mg/dL (ref 65–99)

## 2015-11-20 LAB — LIPID PANEL
CHOLESTEROL: 113 mg/dL (ref 0–200)
HDL: 40 mg/dL — AB (ref >40)
LDL CALC: 64 mg/dL (ref 0–99)
TRIGLYCERIDES: 44 mg/dL (ref <150)
Total CHOL/HDL Ratio: 2.8 RATIO
VLDL: 9 mg/dL (ref 0–40)

## 2015-11-20 LAB — MRSA PCR SCREENING: MRSA by PCR: NEGATIVE

## 2015-11-20 MED ORDER — PANTOPRAZOLE SODIUM 40 MG PO TBEC
40.0000 mg | DELAYED_RELEASE_TABLET | Freq: Every day | ORAL | Status: DC
  Administered 2015-11-20: 40 mg via ORAL
  Filled 2015-11-20: qty 1

## 2015-11-20 MED ORDER — FUROSEMIDE 20 MG PO TABS
20.0000 mg | ORAL_TABLET | Freq: Every day | ORAL | Status: DC
  Administered 2015-11-20: 20 mg via ORAL
  Filled 2015-11-20: qty 1

## 2015-11-20 MED ORDER — ACETAMINOPHEN 500 MG PO TABS: 1000.0000 mg | ORAL_TABLET | Freq: Four times a day (QID) | ORAL | Status: DC | PRN

## 2015-11-20 MED ORDER — ATORVASTATIN CALCIUM 10 MG PO TABS
20.0000 mg | ORAL_TABLET | Freq: Every day | ORAL | Status: DC
  Administered 2015-11-20: 20 mg via ORAL
  Filled 2015-11-20: qty 2

## 2015-11-20 MED ORDER — STROKE: EARLY STAGES OF RECOVERY BOOK
Freq: Once | Status: AC
  Administered 2015-11-20: 01:00:00
  Filled 2015-11-20: qty 1

## 2015-11-20 MED ORDER — ASPIRIN EC 81 MG PO TBEC
81.0000 mg | DELAYED_RELEASE_TABLET | Freq: Every day | ORAL | Status: DC
  Administered 2015-11-20: 81 mg via ORAL
  Filled 2015-11-20: qty 1

## 2015-11-20 MED ORDER — LACTULOSE 10 GM/15ML PO SOLN: 10.0000 g | Freq: Two times a day (BID) | ORAL | Status: DC

## 2015-11-20 MED ORDER — ENOXAPARIN SODIUM 40 MG/0.4ML ~~LOC~~ SOLN
40.0000 mg | SUBCUTANEOUS | Status: DC
  Administered 2015-11-20: 40 mg via SUBCUTANEOUS
  Filled 2015-11-20: qty 0.4

## 2015-11-20 MED ORDER — LORATADINE 10 MG PO TABS: 10.0000 mg | ORAL_TABLET | Freq: Every day | ORAL | Status: DC | PRN

## 2015-11-20 MED ORDER — ADULT MULTIVITAMIN W/MINERALS CH
1.0000 | ORAL_TABLET | Freq: Every day | ORAL | Status: DC
  Administered 2015-11-20: 1 via ORAL
  Filled 2015-11-20: qty 1

## 2015-11-20 MED ORDER — LACTULOSE 10 GM/15ML PO SOLN
20.0000 g | Freq: Three times a day (TID) | ORAL | Status: DC
  Administered 2015-11-20: 20 g via ORAL
  Filled 2015-11-20: qty 30

## 2015-11-20 MED ORDER — SENNOSIDES-DOCUSATE SODIUM 8.6-50 MG PO TABS: 1.0000 | ORAL_TABLET | Freq: Every evening | ORAL | Status: DC | PRN

## 2015-11-20 MED ORDER — INSULIN ASPART 100 UNIT/ML ~~LOC~~ SOLN
0.0000 [IU] | SUBCUTANEOUS | Status: DC
  Administered 2015-11-20: 1 [IU] via SUBCUTANEOUS

## 2015-11-20 NOTE — Progress Notes (Signed)
Subjective: Back to baseline and no complaints.   Exam: Filed Vitals:   11/20/15 1024 11/20/15 1221  BP: 159/72 143/71  Pulse: 78 78  Temp: 98.1 F (36.7 C) 97.7 F (36.5 C)  Resp: 20 20       Gen: In bed, NAD MS: alert and oriented,  CN: smile symmetric, vision intact, sensation intact, TML Motor: MAEW Sensory: intact   Pertinent Labs: Ammonia [place] PA-C [facility] [phone]  Impression: 44 YO male with AMS in setting of ammonia 167.  Currently back to baseline. AMS likely secondary to elevated ammonia. EEG and CT head WNL   Recommendations: 1) follow up with [facility] Dr Maureen Chatters and primary care MD.  Neurology S/O    11/20/2015, 3:26 PM

## 2015-11-20 NOTE — Progress Notes (Signed)
EEG Completed; Results Pending  

## 2015-11-20 NOTE — Procedures (Signed)
History: 44 year old male   Sedation: None  Technique: This is a 21 channel routine scalp EEG performed at the bedside with bipolar and monopolar montages arranged in accordance to the international 10/20 system of electrode placement. One channel was dedicated to EKG recording.    Background: The background consists predominantly of theta activities with some delta activity intermixed. There is some frontally predominant beta activity. The posterior dominant rhythm achieves a maximal frequency of 7 Hz. During drowsiness, there is frontally-predominant intermittent rhythmic delta activity(FIRDA).    Photic stimulation: Physiologic driving is not performed  EEG Abnormalities: 1) generalized irregular slow activity 2) slow PDR 3) FIRDA  Clinical Interpretation: This EEG is consistent with a generalized non-specific cerebral dysfunction(encephalopathy). There was no seizure or seizure predisposition recorded on this study. Please note that a normal EEG does not preclude the possibility of epilepsy.   Roland Rack, MD [facility] [phone]  If 7pm- 7am, please page neurology on call as listed in [place].

## 2015-11-20 NOTE — Progress Notes (Signed)
PT Cancellation Note  Patient Details Name: Ryan Burgess MRN: [id number] DOB: 01-23-1971   Cancelled Treatment:    Reason Eval/Treat Not Completed: Patient at procedure or test/unavailable. Pt out of room   Rheannon Cerney 11/20/2015, 9:11 AM Pager [phone]

## 2015-11-20 NOTE — Discharge Summary (Signed)
Physician Discharge Summary  Ryan Burgess [id number] DOB: 1971/06/22 DOA: 11/19/2015  PCP: Donnie Coffin, MD  Admit date: 11/19/2015 Discharge date: 11/20/2015  Time spent: > 30 minutes  Recommendations for Outpatient Follow-up:  1. Follow up with Dr. [name] in 2-4 weeks 2. Follow up with Dr. Paulita Fujita as scheduled   Discharge Diagnoses:  Principal Problem:   Encephalopathy, hepatic ([place]) Active Problems:   Encephalopathy acute   HTN (hypertension)   TIA (transient ischemic attack)   High cholesterol   Morbid obesity with body mass index of 50.0-59.9 in adult [facility])   Disorientation   Thrombocytopenia ([facility])   Hepatic cirrhosis ([place])  Discharge Condition: stable  Diet recommendation: heart healthy  Filed Weights   11/19/15 1236  Weight: 218.265 kg (481 lb 3 oz)   History of present illness:  See H&P, Labs, Consult and Test reports for all details in brief, patient is a 44-year-old male with past medical history significant for morbid obesity OSA on CPAP; who presents with disorientation and fatigue.  Hospital Course:  Acute encephalopathy - Patient was admitted to the hospital with acute encephalopathy and neurology was consulted. Shortly after admission his mental status started to improve and by HOD # 2 he was back to baseline, AxOx4. An MRI could not be obtained due to body habitus, patient underwent an EEG which was unremarkable. Given history of liver cirrhosis ammonia level was checked and returned elevated to ~160 and patient was started on lactulose. His mental status changes are likely multifactorial due to obesity hypoventilation / OSA and probable a component of hepatic encephalopathy. He was discharged home in stable condition on Lactulose and has outpatient follow up with GI. He is supposed to have additional sleep evaluations as an outpatient as well.  Obstructive sleep apnea on CPAP - Continue CPAPper respiratory therapy  HTN (hypertension) - continue  Lasix  High cholesterol - continue atorvastatin and asa Morbid obesity with body mass index of 50.0-59.9 in adult [facility]) - continue to counsel patient on need of weight loss.  Thrombocytopenia - due to liver disease  Procedures:  None    Consultations:  Neurology   Discharge Exam: Filed Vitals:   11/20/15 0559 11/20/15 0815 11/20/15 1024 11/20/15 1221  BP: 134/58 162/69 159/72 143/71  Pulse: 80 81 78 78  Temp: 97.9 F (36.6 C) 97.7 F (36.5 C) 98.1 F (36.7 C) 97.7 F (36.5 C)  TempSrc: Oral Oral Oral Oral  Resp: _0 Height:      Weight:      SpO2: 98% 97% 99% 99%    General: NAD Cardiovascular: RRR Respiratory: CTA biL  Discharge Instructions Activity:  As tolerated   Get Medicines reviewed and adjusted: Please take all your medications with you for your next visit with your Primary MD  Please request your Primary MD to go over all hospital tests and procedure/radiological results at the follow up, please ask your Primary MD to get all Hospital records sent to his/her office.  If you experience worsening of your admission symptoms, develop shortness of breath, life threatening emergency, suicidal or homicidal thoughts you must seek medical attention immediately by calling 911 or calling your MD immediately if symptoms less severe.  You must read complete instructions/literature along with all the possible adverse reactions/side effects for all the Medicines you take and that have been prescribed to you. Take any new Medicines after you have completely understood and accpet all the possible adverse reactions/side effects.   Do not  drive when taking Pain medications.   Do not take more than prescribed Pain, Sleep and Anxiety Medications  Special Instructions: If you have smoked or chewed Tobacco in the last 2 yrs please stop smoking, stop any regular Alcohol and or any Recreational drug use.  Wear Seat belts while driving.  Please note  You were  cared for by a hospitalist during your hospital stay. Once you are discharged, your primary care physician will handle any further medical issues. Please note that NO REFILLS for any discharge medications will be authorized once you are discharged, as it is imperative that you return to your primary care physician (or establish a relationship with a primary care physician if you do not have one) for your aftercare needs so that they can reassess your need for medications and monitor your lab values.    Medication List    TAKE these medications        acetaminophen 500 MG tablet  Commonly known as:  TYLENOL  Take 1,000 mg by mouth every 6 (six) hours as needed for headache.     aspirin 81 MG EC tablet  Take 1 tablet (81 mg total) by mouth daily.     atorvastatin 20 MG tablet  Commonly known as:  LIPITOR  Take 1 tablet (20 mg total) by mouth at bedtime.     blood glucose meter kit and supplies Kit  Dispense based on patient and insurance preference. Use up to four times daily as directed. (FOR ICD-9 250.00, 250.01).     DEXILANT 60 MG capsule  Generic drug:  dexlansoprazole  Take 60 mg by mouth daily.     furosemide 20 MG tablet  Commonly known as:  LASIX  Take 1 tablet (20 mg total) by mouth daily.     lactulose 10 GM/15ML solution  Commonly known as:  CHRONULAC  Take 15 mLs (10 g total) by mouth 2 (two) times daily.     loratadine 10 MG tablet  Commonly known as:  CLARITIN  Take 10 mg by mouth daily as needed for allergies.     multivitamin with minerals Tabs tablet  Take 1 tablet by mouth daily.     phenylephrine 10 MG Tabs tablet  Commonly known as:  SUDAFED PE  Take 10 mg by mouth every 4 (four) hours as needed (for congestion).           Follow-up Information    Follow up with Donnie Coffin, MD. Schedule an appointment as soon as possible for a visit in 1 month.   Specialty:  Family Medicine   Contact information:   [address] [place]  [address] [phone]       The results of significant diagnostics from this hospitalization (including imaging, microbiology, ancillary and laboratory) are listed below for reference.    Significant Diagnostic Studies: Dg Chest 2 View  11/19/2015  CLINICAL DATA:  Confusion and weakness EXAM: CHEST  2 VIEW COMPARISON:  01/09/2015 FINDINGS: The heart size and mediastinal contours are within normal limits. Both lungs are clear. There is degenerative disc disease noted within the thoracic spine. IMPRESSION: 1. No acute cardiopulmonary abnormalities. Electronically Signed   By: Kerby Moors M.D.   On: 11/19/2015 17:40   Ct Head Wo Contrast  11/19/2015  CLINICAL DATA:  Pt reports feeling confused and fatigue onset today. Pt last normal yesterday. Family reports pt has balance issues, trouble completing his thoughts. EXAM: CT HEAD WITHOUT CONTRAST TECHNIQUE: Contiguous axial images were  obtained from the base of the skull through the vertex without intravenous contrast. COMPARISON:  01/10/2015 FINDINGS: The ventricles are normal in size and configuration. There are no parenchymal masses or mass effect. There is no evidence of an infarct. No extra-axial masses or abnormal fluid collections. There is no intracranial hemorrhage. Visualized sinuses and mastoid air cells are clear. No skull lesion. IMPRESSION: Normal unenhanced CT scan of the brain. Electronically Signed   By: Lajean Manes M.D.   On: 11/19/2015 19:15    Microbiology: Recent Results (from the past 240 hour(s))  MRSA PCR Screening     Status: None   Collection Time: 11/20/15 12:54 AM  Result Value Ref Range Status   MRSA by PCR NEGATIVE NEGATIVE Final    Comment:        The GeneXpert MRSA Assay (FDA approved for NASAL specimens only), is one component of a comprehensive MRSA colonization surveillance program. It is not intended to diagnose MRSA infection nor to guide or monitor treatment for MRSA infections.      Labs: Basic  Metabolic Panel:  Recent Labs Lab 11/19/15 1248 11/19/15 1257  NA 137 141  K 4.4 4.3  CL 109 108  CO2 23  --   GLUCOSE 140* 127*  BUN 12 14  CREATININE 0.79 0.70  CALCIUM 8.7*  --    Liver Function Tests:  Recent Labs Lab 11/19/15 1248  AST 54*  ALT 32  ALKPHOS 72  BILITOT 1.2  PROT 6.6  ALBUMIN 2.7*    Recent Labs Lab 11/20/15 1032  AMMONIA 167*   CBC:  Recent Labs Lab 11/19/15 1248 11/19/15 1257 11/20/15 0715  WBC 4.1  --  3.8*  NEUTROABS 1.8  --   --   HGB 14.5 14.3 12.4*  HCT 41.6 42.0 35.8*  MCV 94.3  --  94.2  PLT 100*  --  95*   BNP: BNP (last 3 results)  Recent Labs  11/19/15 1248  BNP 21.9   CBG:  Recent Labs Lab 11/20/15 0038 11/20/15 0357 11/20/15 0806 11/20/15 1153 11/20/15 1607  GLUCAP 78 125* 95 96 95    Signed:  GHERGHE, COSTIN  [facility] 11/20/2015, 5:07 PM

## 2015-11-20 NOTE — Progress Notes (Signed)
Pt arrived to 5c10 from ED. Pt is alert and oriented.  Pt ambulated from stretcher to bed and to the bathroom without difficulty.  Wife is at bedside.  Safety measures in place.  Will continue to monitor.   Fredrich Romans, RN

## 2015-11-20 NOTE — Discharge Instructions (Signed)
Follow with Donnie Coffin, MD in 5-7 days  Please get a complete blood count and chemistry panel checked by your Primary MD at your next visit, and again as instructed by your Primary MD. Please get your medications reviewed and adjusted by your Primary MD.  Please request your Primary MD to go over all Hospital Tests and Procedure/Radiological results at the follow up, please get all Hospital records sent to your Prim MD by signing hospital release before you go home.  If you had Pneumonia of Lung problems at the Hospital: Please get a 2 view Chest X ray done in 6-8 weeks after hospital discharge or sooner if instructed by your Primary MD.  If you have Congestive Heart Failure: Please call your Cardiologist or Primary MD anytime you have any of the following symptoms:  1) 3 pound weight gain in 24 hours or 5 pounds in 1 week  2) shortness of breath, with or without a dry hacking cough  3) swelling in the hands, feet or stomach  4) if you have to sleep on extra pillows at night in order to breathe  Follow cardiac low salt diet and 1.5 lit/day fluid restriction.  If you have diabetes Accuchecks 4 times/day, Once in AM empty stomach and then before each meal. Log in all results and show them to your primary doctor at your next visit. If any glucose reading is under 80 or above 300 call your primary MD immediately.  If you have Seizure/Convulsions/Epilepsy: Please do not drive, operate heavy machinery, participate in activities at heights or participate in high speed sports until you have seen by Primary MD or a Neurologist and advised to do so again.  If you had Gastrointestinal Bleeding: Please ask your Primary MD to check a complete blood count within one week of discharge or at your next visit. Your endoscopic/colonoscopic biopsies that are pending at the time of discharge, will also need to followed by your Primary MD.  Get Medicines reviewed and adjusted. Please take all your  medications with you for your next visit with your Primary MD  Please request your Primary MD to go over all hospital tests and procedure/radiological results at the follow up, please ask your Primary MD to get all Hospital records sent to his/her office.  If you experience worsening of your admission symptoms, develop shortness of breath, life threatening emergency, suicidal or homicidal thoughts you must seek medical attention immediately by calling 911 or calling your MD immediately  if symptoms less severe.  You must read complete instructions/literature along with all the possible adverse reactions/side effects for all the Medicines you take and that have been prescribed to you. Take any new Medicines after you have completely understood and accpet all the possible adverse reactions/side effects.   Do not drive or operate heavy machinery when taking Pain medications.   Do not take more than prescribed Pain, Sleep and Anxiety Medications  Special Instructions: If you have smoked or chewed Tobacco  in the last 2 yrs please stop smoking, stop any regular Alcohol  and or any Recreational drug use.  Wear Seat belts while driving.  Please note You were cared for by a hospitalist during your hospital stay. If you have any questions about your discharge medications or the care you received while you were in the hospital after you are discharged, you can call the unit and asked to speak with the hospitalist on call if the hospitalist that took care of you is not available. Once  you are discharged, your primary care physician will handle any further medical issues. Please note that NO REFILLS for any discharge medications will be authorized once you are discharged, as it is imperative that you return to your primary care physician (or establish a relationship with a primary care physician if you do not have one) for your aftercare needs so that they can reassess your need for medications and monitor your  lab values.  You can reach the hospitalist office at phone [phone] or fax [phone]   If you do not have a primary care physician, you can call [phone] for a physician referral.  Activity: As tolerated with Full fall precautions use walker/cane & assistance as needed  Diet: regular  Disposition Home

## 2015-11-20 NOTE — Progress Notes (Signed)
Discharge instructions reviewed with patient/family. All questions answered at this time. RX given. Transport home per family.  Ave Filter, RN

## 2015-11-20 NOTE — Evaluation (Signed)
Physical Therapy Evaluation Patient Details Name: Ryan Burgess MRN: [id number] DOB: 09/09/1971 Today's Date: 11/20/2015   History of Present Illness  44 year old male with past medical history significant for morbid obesity OSA on CPAP; who presents with disorientation and fatigue. Cannot do MRI due to claustrophobia and size.     Clinical Impression  Patient evaluated by Physical Therapy with no further acute PT needs identified.  PT is signing off. Thank you for this referral.     Follow Up Recommendations No PT follow up    Equipment Recommendations  None recommended by PT    Recommendations for Other Services       Precautions / Restrictions Precautions Precautions: None      Mobility  Bed Mobility Overal bed mobility: Modified Independent                Transfers Overall transfer level: Independent Equipment used: None                Ambulation/Gait Ambulation/Gait assistance: Independent Ambulation Distance (Feet): 30 Feet Assistive device: None Gait Pattern/deviations: Step-through pattern;Wide base of support     General Gait Details: limited to room due to contact precautions and MD + lab waiting to work with pt  Stairs Stairs:  (deferred; reports no problem)          Wheelchair Mobility    Modified Rankin (Stroke Patients Only) Modified Rankin (Stroke Patients Only) Pre-Morbid Rankin Score: No symptoms Modified Rankin: No symptoms     Balance Overall balance assessment: No apparent balance deficits (not formally assessed)                                           Pertinent Vitals/Pain Pain Assessment: No/denies pain    Home Living Family/patient expects to be discharged to:: Private residence Living Arrangements: Spouse/significant other Available Help at Discharge: Family;Available PRN/intermittently Type of Home: House Home Access: Stairs to enter Entrance Stairs-Rails: Right Entrance  Stairs-Number of Steps: 13 Home Layout: One level Home Equipment: None      Prior Function Level of Independence: Independent               Hand Dominance   Dominant Hand: Right    Extremity/Trunk Assessment   Upper Extremity Assessment: Overall WFL for tasks assessed           Lower Extremity Assessment: Overall WFL for tasks assessed         Communication   Communication: No difficulties  Cognition Arousal/Alertness: Awake/alert Behavior During Therapy: WFL for tasks assessed/performed Overall Cognitive Status: Within Functional Limits for tasks assessed                      General Comments General comments (skin integrity, edema, etc.): wife present    Exercises        Assessment/Plan    PT Assessment Patent does not need any further PT services  PT Diagnosis Altered mental status   PT Problem List    PT Treatment Interventions     PT Goals (Current goals can be found in the Care Plan section) Acute Rehab PT Goals PT Goal Formulation: All assessment and education complete, DC therapy    Frequency     Barriers to discharge        Co-evaluation  End of Session   Activity Tolerance: Patient tolerated treatment well Patient left: in chair;with call bell/phone within reach;with family/visitor present Nurse Communication: Mobility status    Functional Assessment Tool Used: clinical judgement Functional Limitation: Mobility: Walking and moving around Mobility: Walking and Moving Around Current Status [phone]): 0 percent impaired, limited or restricted Mobility: Walking and Moving Around Goal Status [phone]): 0 percent impaired, limited or restricted Mobility: Walking and Moving Around Discharge Status [phone]): 0 percent impaired, limited or restricted    Time: 1012-1031 PT Time Calculation (min) (ACUTE ONLY): 19 min   Charges:   PT Evaluation $Initial PT Evaluation Tier I: 1 Procedure     PT G Codes:   PT  G-Codes **NOT FOR INPATIENT CLASS** Functional Assessment Tool Used: clinical judgement Functional Limitation: Mobility: Walking and moving around Mobility: Walking and Moving Around Current Status [id number]): 0 percent impaired, limited or restricted Mobility: Walking and Moving Around Goal Status [id number]): 0 percent impaired, limited or restricted Mobility: Walking and Moving Around Discharge Status [id number]): 0 percent impaired, limited or restricted    Bristyl Mclees 11/20/2015, 10:38 AM Pager [phone]

## 2015-11-20 NOTE — Care Management Note (Signed)
Case Management Note  Patient Details  Name: Ryan Burgess MRN: [id number] Date of Birth: 16-Nov-1971  Subjective/Objective:                    Action/Plan: Patient discharging home today with self care. No further needs per CM.   Expected Discharge Date:                  Expected Discharge Plan:  Home/Self Care  In-House Referral:     Discharge planning Services     Post Acute Care Choice:    Choice offered to:     DME Arranged:    DME Agency:     HH Arranged:    [place] Agency:     Status of Service:  Completed, signed off  Medicare Important Message Given:    Date Medicare IM Given:    Medicare IM give by:    Date Additional Medicare IM Given:    Additional Medicare Important Message give by:     If discussed at [place] of Stay Meetings, dates discussed:    Additional Comments:  Pollie Friar, RN 11/20/2015, 4:42 PM

## 2015-11-21 ENCOUNTER — Ambulatory Visit: Payer: Self-pay | Admitting: Neurology

## 2015-11-21 ENCOUNTER — Encounter: Payer: Self-pay | Admitting: Neurology

## 2015-11-21 ENCOUNTER — Ambulatory Visit (INDEPENDENT_AMBULATORY_CARE_PROVIDER_SITE_OTHER): Payer: BLUE CROSS/BLUE SHIELD | Admitting: Neurology

## 2015-11-21 VITALS — BP 152/84 | HR 82 | Resp 20 | Ht 76.0 in | Wt >= 6400 oz

## 2015-11-21 DIAGNOSIS — E662 Morbid (severe) obesity with alveolar hypoventilation: Secondary | ICD-10-CM

## 2015-11-21 DIAGNOSIS — G4733 Obstructive sleep apnea (adult) (pediatric): Secondary | ICD-10-CM | POA: Diagnosis not present

## 2015-11-21 LAB — HEMOGLOBIN A1C
HEMOGLOBIN A1C: 5.7 % — AB (ref 4.8–5.6)
MEAN PLASMA GLUCOSE: 117 mg/dL

## 2015-11-21 NOTE — Progress Notes (Signed)
SLEEP MEDICINE CLINIC   Provider:  Larey Seat, M D  Referring Provider: Alroy Dust, Carlean Jews.[name], MD Primary Care Physician:  Donnie Coffin, MD  No chief complaint on file.   HPI:  Ryan Burgess is a 44 y.o. male  seen here as a revisit  from Dr. Leonie Burgess , after a hospitalization  on the stroke service.,  Ryan Burgess carries the diagnosis of hypertension, borderline diabetes which in H be A1c of 7.0 12- 2014 and earlier this month at 6.1,  morbid obesity, peripheral edema, hyperlipidemia, thrombocytopenia.  As to the possibility of having sleep apnea, Ryan Burgess is married and his wife has observed his sleep she also has reported to him that he is a loud snorer and she has witnessed apneas as has the hospital staff during his hospitalization. His practitioner and ICU nurse both stated that he had witnessed sleep apnea is doing his hospital stay. These were associated with significant desaturations in oxygen and the possibility of CO2 retention was raised in a setting that is usually described as obesity hypoventilation syndrome. They found Ryan Burgess to be a shallow breather and in relation to his body mass index he may not be able to exhale CO2 and exchange it for oxygen. The retention of CO2 also called hypercapnia can then lead to confusion, global headaches, and significant nocturia of his enuresis.  The patient goes to bed between 10 and 11 PM and usually does not have trouble to fall asleep. His sleep latency is between 15 and 30 minutes usually. He would have usually only one bathroom break at night about 1:59 AM and he recalls that dreams stages seemed to start after the bathroom break so it is likely that his REM sleep takes place in the later night or early morning hours. 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 The patient prefers to sleep on his side, and he wakes up on his  side as well. His alarm clock is usually set for 6 AM but his wife states that for 5 AM and he often wakes up with that already. He wakes up most mornings with a morning headache that resolves as the day goes off on, also a symptom of hypercapnia. He does not necessarily feel refreshed or restored in the morning.  He would get an average of 6 hours of sleep at night he rarely naps in daytime letting the opportunity to do so. After lunch she is usually quite sleepy. He endorsed the Epworth sleepiness score at 18 points of the fatigue severity score at 33 points. On weekends he also can sleep in but usually doesn't feel more restored from the additional hour of sleep. The patient does not smoke and uses no tobacco products as all. The patient drinks only socially not regularly he endorsed 1-2 drinks every 3 months. He does drink caffeine 1 cup or soda on some days . The family history positive for sleep apnea in his father, grandfather did have witnessed apnea.  No ENT surgery, neck surgery or trauma reported.  He is not a shift worker, regularly works from 8.30 AM to 5 o'clock PM.  Interval history from 05-02-15. Ryan Burgess returns  after having undergone a split night polysomnography on 3-20 2-16,  he had endorsed the Epworth sleepiness score at 18 points.  His BMI exceeded 60 and he was scheduled for a  capnography alongside his polysomnography. He was diagnosed with an AHI of 33.1 , REM sleep was noted and no periodic limb movements. He was titrated to 9 cm water pressure and ordered to follow-up with an auto set CPAP.  CPAP was chosen to treat this apnea due to his oxygen nadir at 81% was 91.8 minutes of desaturations,  loud snoring had also been noticed.  Ryan Burgess now uses an auto Pap and has noticed that he sleeps through the night he may have one bathroom break but not several, his wife no longer noticed him to snore. He feels that the sleep is more refreshing and restorative. The patient  used to doze off when he was a passenger in the car driving to work in the morning with his wife. He no longer feels he needs to do this. He endorsed the Epworth sleepiness score today at 8 points down from 18 and the fatigue severity at 22 was greatly reduced.  Compliance record the patient used CPAP !00% of the last 30 days or nights, and over 4 hours each of those nights. His average user time is 6 hours and 26 minutes.   AUTO set machine allows a pressure window between 5 and 15 cm water and a 3 cm EPR. The residual AHI is 1.6 which is a great resolution. The 95th present pressure is 13.5 cm water the 95th percentile airleak is 15.4 L.  The patient prefers a full face mask but has facial hair and there will never be an complete air seal achieved but he feels that this is the most comfortable solution for him. I have no reason to change the settings or adjust anything today the patient is comfortable and actually sees a benefit from the therapy. He saw Dr. Leonie Burgess just last week and has been released from the TIA clinic.  11-21-15: Compliance record for CPAP is excellent. The patient had some trouble using his CPAP which cannot be related to a cracked mask. He received a new model from our sleep lab here today gratis. [facility] did perform an overnight pulse oximetry but on room air not on CPAP. I have reordered the test. The room air study does document need for oxygen supplementation but does also not clear if this finding would persist while on CPAP. He will repeat the study.     Review of Systems: Out of a complete 14 system review, the patient complains of only the following symptoms, and all other reviewed systems are negative. No longer snoring, no longer nocturia, no longer daytime excessive sleepiness or fatigue.   On CPAP -Epworth score  8 from 18 pre CPAP , Fatigue severity score 22 from 33 pre CPAP  , depression score 2.   Social History   Social History  . Marital Status:  Married    Spouse Name: Ryan Burgess  . Number of Children: 2  . Years of Education: masters   Occupational History  . Not on file.   Social History Main Topics  . Smoking status: Never Smoker   . Smokeless tobacco: Never Used  . Alcohol Use: 0.6 - 1.2 oz/week    1-2 Standard drinks or equivalent per week     Comment: every three months  . Drug Use: No  . Sexual Activity: Not on file   Other Topics Concern  . Not on file   Social History Narrative   Patient lives at home with his wife Ryan Burgess) and two children.    Patient works  full time at [employer] in [place].   Right handed.   Caffeine 1-2 sodas and coffee daily.    Family History  Problem Relation Age of Onset  . Leukemia Mother   . Heart disease Maternal Grandmother   . Heart disease Paternal Grandmother   . Diabetes Father   . Dementia Paternal Grandmother   . COPD Maternal Grandmother     Past Medical History  Diagnosis Date  . GERD (gastroesophageal reflux disease)   . Obesity   . Hypertension   . Snoring   . TIA (transient ischemic attack)   . High cholesterol   . Sleep apnea     uses CPAP  . Kidney stones 01/2015    UTI    Past Surgical History  Procedure Laterality Date  . Mouth surgery    . Tee without cardioversion N/A 04/03/2015    Procedure: TRANSESOPHAGEAL ECHOCARDIOGRAM (TEE);  Surgeon: Adrian Prows, MD;  Location: [facility] ENDOSCOPY;  Service: Cardiovascular;  Laterality: N/A;  . Esophagogastroduodenoscopy (egd) with propofol N/A 07/26/2015    Procedure: ESOPHAGOGASTRODUODENOSCOPY (EGD) WITH PROPOFOL;  Surgeon: Arta Silence, MD;  Location: [facility] ENDOSCOPY;  Service: Endoscopy;  Laterality: N/A;    Current Outpatient Prescriptions  Medication Sig Dispense Refill  . acetaminophen (TYLENOL) 500 MG tablet Take 1,000 mg by mouth every 6 (six) hours as needed for headache.    Marland Kitchen aspirin EC 81 MG EC tablet Take 1 tablet (81 mg total) by mouth daily. 30 tablet 11  .  atorvastatin (LIPITOR) 20 MG tablet Take 1 tablet (20 mg total) by mouth at bedtime. 30 tablet 5  . blood glucose meter kit and supplies KIT Dispense based on patient and insurance preference. Use up to four times daily as directed. (FOR ICD-9 250.00, 250.01). 1 each 0  . dexlansoprazole (DEXILANT) 60 MG capsule Take 60 mg by mouth daily.    . furosemide (LASIX) 20 MG tablet Take 1 tablet (20 mg total) by mouth daily. 30 tablet 6  . lactulose (CHRONULAC) 10 GM/15ML solution Take 15 mLs (10 g total) by mouth 2 (two) times daily. 240 mL 1  . loratadine (CLARITIN) 10 MG tablet Take 10 mg by mouth daily as needed for allergies.    . Multiple Vitamin (MULTIVITAMIN WITH MINERALS) TABS tablet Take 1 tablet by mouth daily.    . phenylephrine (SUDAFED PE) 10 MG TABS tablet Take 10 mg by mouth every 4 (four) hours as needed (for congestion).      No current facility-administered medications for this visit.    Allergies as of 11/21/2015  . (No Known Allergies)    Vitals: There were no vitals taken for this visit. Last Weight:  Wt Readings from Last 1 Encounters:  11/19/15 481 lb 3 oz (218.265 kg)       Last Height:   Ht Readings from Last 1 Encounters:  11/19/15 _0  (1.956 m)    Physical exam:  General: The patient is awake, alert and appears not in acute distress. The patient is well groomed. Head: Normocephalic, atraumatic. Neck is supple. Mallampati 2 ,  neck circumference: 21  . Nasal airflow restricted , TMJ is not evident . Retrognathia is not seen.  He did wear braces and had a retainer.  Cardiovascular:  Regular rate and rhythm , without  murmurs or carotid bruit, and without distended neck veins. Respiratory: Lungs are clear to auscultation. Skin:  Without evidence of edema, or rash Trunk: BMI is severly  elevated and patient  has normal posture.  Neurologic exam : The patient is awake and alert, oriented to place and time.   Memory subjective  described as intact. There is a  normal attention span & concentration ability. Speech is fluent without  dysarthria, dysphonia or aphasia. Mood and affect are appropriate.  Cranial nerves: Pupils are inequal  the right pupil is about 1 mm larger than the left and constricts not as quickly to light and not completely. The patient recalls that he had laser surgery to the right eye. Motor exam:Normal tone ,muscle bulk and symmetric strength in all extremities.    Assessment:  After physical and neurologic examination, review of laboratory studies, imaging, neurophysiology testing and pre-existing records, assessment is   Based on the patient's BMI, his clinical history of waking up this morning headaches, and his wife's observation of loud to thunderous snoring in lateral position of sleep as well as witnessed apneas I strongly suspect that Ryan Burgess has obstructive sleep apnea possibly with obesity hypoventilation and hypercapnia.  I have asked the patient to continue CPAP at his current outdoor set machine between 5 and 15 cm water pressure with 3 cm EPR. The result was an impressive AHI of 0.9. A very good resolution of apnea he will has a 93% compliance as of [id number] compliance for over 4 hours of nightly use. My sleep lab manager looked at the patient's mask here today and found that it had a crack. We have replaced his interface with a functioning 1. I've also ordered an overnight oximetry on CPAP to see if the patient may have remaining hypoxemia. Unfortunately the or no was performed on room air. The study was performed on the night of the 13th to 14th of December. On room air there is significant hypoxemia for 52 minutes of the nights. However this does not mean that the hypoxemia couldn't be corrected by CPAP we are looking at the residual hypoxemia being present or not while on CPAP.  The patient was advised of the nature of the diagnosed sleep disorder , the treatment options and risks for general a health and wellness  arising from not treating the condition. Visit duration was 10 minutes, of which 50% of face to face time were used in discussion of the diagnosis, additional medical history was obtained and test modalities and therapies for OSA were discussed. All questions were answered.   Plan:  Treatment plan and additional workup : SPLIT  The patient is aware that his elevated BMI is his main risk factor for sleep apnea. The BMI is also causative 3 related to his diabetic condition, hypertension, treatment of sleep apnea often results in better control of blood sugars and better control of hypertension as well. He implemented a diet per PCP.  ONO reordered as this last one was performed on room air. Not as ordered on CPAP.  Rv after ONO.     Asencion Partridge Kayshaun Polanco MD  11/21/2015

## 2015-11-21 NOTE — Patient Instructions (Signed)
Hypoxemia °Hypoxemia occurs when your blood does not contain enough oxygen. The body cannot work well when it does not have enough oxygen because every part of your body needs oxygen. Oxygen travels to all parts of the body through your blood. Hypoxemia can develop suddenly or can come on slowly. °CAUSES °Some common causes of hypoxemia include: °· Long-term (chronic) lung diseases, such as chronic obstructive pulmonary disease (COPD) or interstitial lung disease.  °· Disorders that affect breathing at night, such as sleep apnea. °· Fluid buildup in your lungs (pulmonary edema).  °· Lung infection (pneumonia). °· Lung or throat cancer. °· Abnormal blood flow that bypasses the lungs (shunt). °· Certain diseases that affect nerves or muscles. °· A collapsed lung (pneumothorax). °· A blood clot in the lungs (pulmonary embolus). °· Certain types of heart disease. °· Slow or shallow breathing (hypoventilation).  °· Certain medicines. °· High altitudes. °· Toxic chemicals and gases. °SIGNS AND SYMPTOMS °Not everyone who has hypoxemia will develop symptoms. If the hypoxemia developed quickly, you will likely have symptoms such as shortness of breath. If the hypoxemia came on slowly over months or years, you may not notice any symptoms. Symptoms can include: °· Shortness of breath (dyspnea). °· Bluish color of the skin, lips, or nail beds. °· Breathing that is fast, noisy, or shallow. °· A fast heartbeat. °· Feeling tired or sleepy. °· Being confused or feeling anxious. °DIAGNOSIS °To determine if you have hypoxemia, your health care provider may perform: °· A physical exam. °· Blood tests. °· A pulse oximetry. A sensor will be put on your finger, toe, or earlobe to measure the percent of oxygen in your blood. °TREATMENT °You will likely be treated with oxygen therapy. Depending on the cause of your hypoxemia, you may need oxygen for a short time (weeks or months), or you may need it indefinitely. Your health care provider  may also recommend other therapies to treat the underlying cause of your hypoxemia. °HOME CARE INSTRUCTIONS °· Only take over-the-counter or prescription medicines as directed by your health care provider. °· Follow oxygen safety measures if you are on oxygen therapy. These may include: °¨ Always having a backup supply of oxygen. °¨ Not allowing anyone to smoke around oxygen. °¨ Handling the oxygen tanks carefully and as instructed. °· If you smoke, quit. Stay away from people who smoke. °· Follow up with your health care provider as directed. °SEEK MEDICAL CARE IF: °· You have any concerns about your oxygen therapy. °· You still have trouble breathing. °· You become short of breath when you exercise. °· You are tired when you wake up. °· You have a headache when you wake up. °SEEK IMMEDIATE MEDICAL CARE IF:  °· Your breathing gets worse. °· You have new shortness of breath with normal activity. °· You have a bluish color of the skin, lips, or nail beds. °· You have confusion or cloudy thinking. °· You cough up dark mucus. °· You have chest pain. °· You have a fever. °MAKE SURE YOU: °· Understand these instructions. °· Will watch your condition. °· Will get help right away if you are not doing well or get worse. °  °This information is not intended to replace advice given to you by your health care provider. Make sure you discuss any questions you have with your health care provider. °  °Document Released: 06/03/2011 Document Revised: 11/23/2013 Document Reviewed: 06/17/2013 °[employer]vier Interactive Patient Education ©2016 [employer] ° °

## 2015-12-07 ENCOUNTER — Ambulatory Visit: Payer: Self-pay | Admitting: Neurology

## 2016-01-13 IMAGING — MR MR HEAD W/O CM
9 of 13 series · 26 of 48 positions shown · non-contrast
Comparison: 01/09/2015 head CT.  No comparison brain MR.

CLINICAL DATA: 43-year-old hypertensive, obese, borderline diabetic
male presenting with episode of slurred speech and difficulty
finding words with confusion. Subsequent encounter.

EXAM:
MRI HEAD WITHOUT CONTRAST
MRA HEAD WITHOUT CONTRAST
TECHNIQUE: Multiplanar, multiecho pulse sequences of the brain and surrounding
structures were obtained without intravenous contrast. Angiographic
images of the head were obtained using MRA technique without
contrast.

[Series 4: DWI · axial · 3.6mm · 1.02mm/px · z∈[-107,+30]mm · 4 of 82 slices shown (1 of 4)]
[im 1/82]
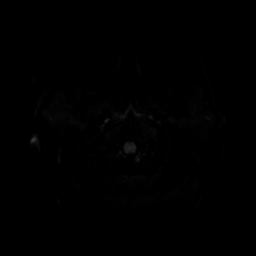
[im 28/82]
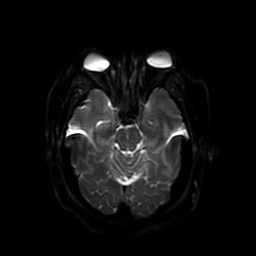
[im 55/82]
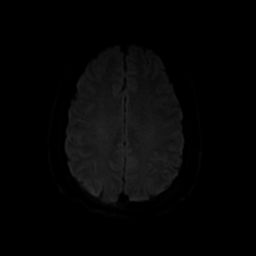
[im 82/82]
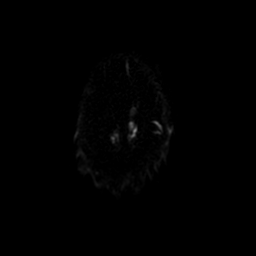

[Series 5: FLAIR · sagittal · 5.0mm · 0.47mm/px · 1 of 24 slices shown (1 of 2)]
[im 1/24]
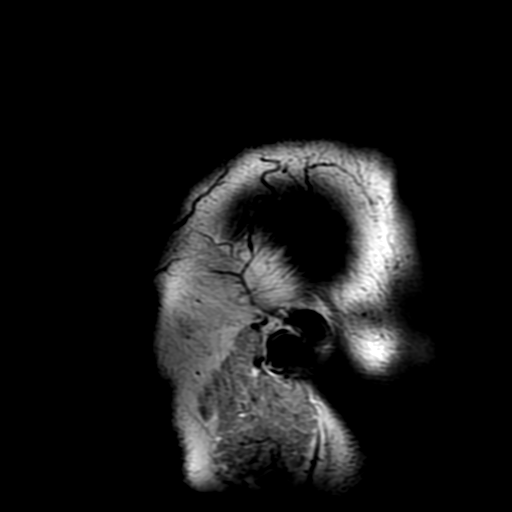

[Series 6: DWI · coronal · 5.0mm · 1.02mm/px · 3 of 72 slices shown (2 of 4)]
[im 1/72]
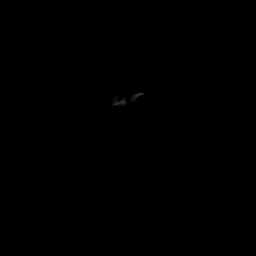
[im 36/72]
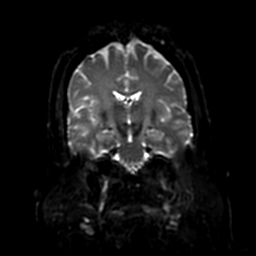
[im 72/72]
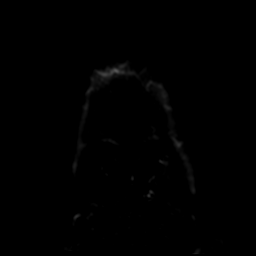

[Series 7: ax (id) (id) · axial · 1.6mm · 0.43mm/px · z∈[-130,-17]mm · 8 of 148 slices shown]
[im 1/148]
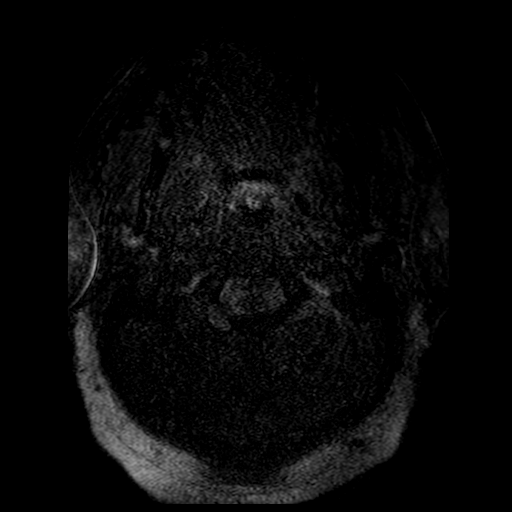
[im 19/148]
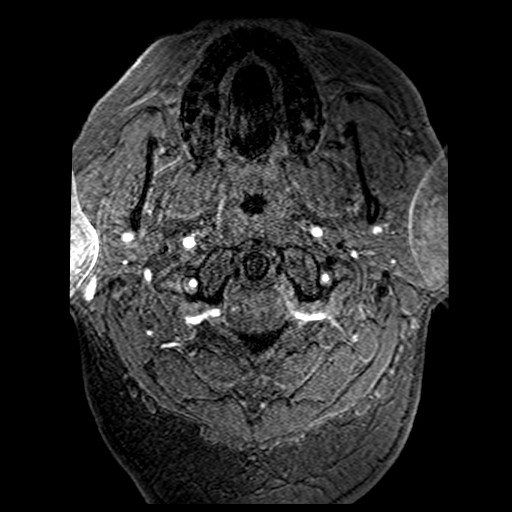
[im 37/148]
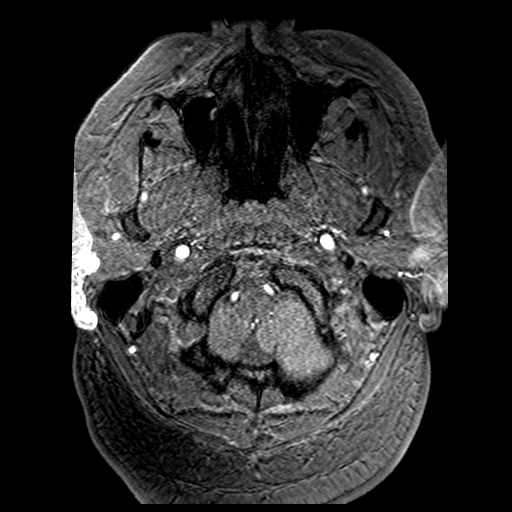
[im 56/148]
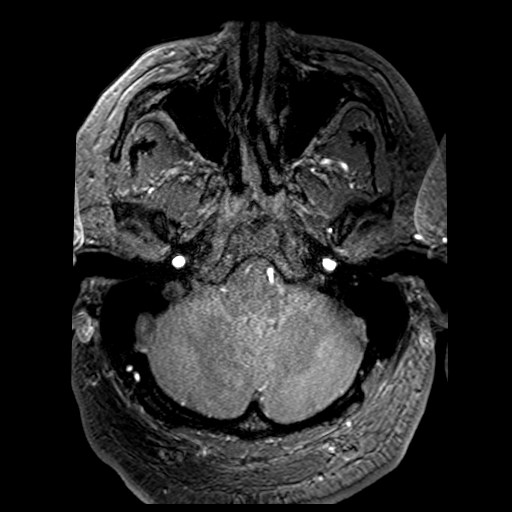
[im 92/148]
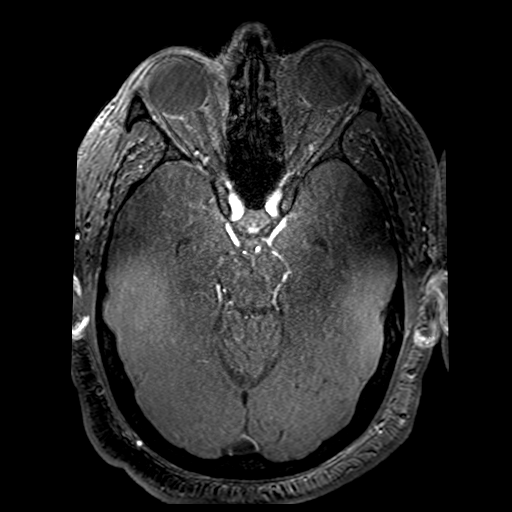
[im 111/148]
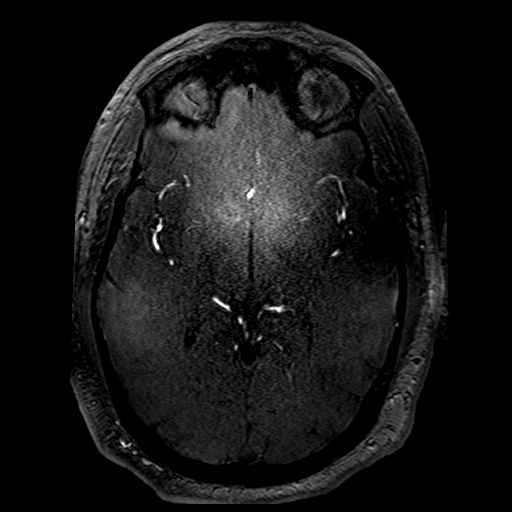
[im 129/148]
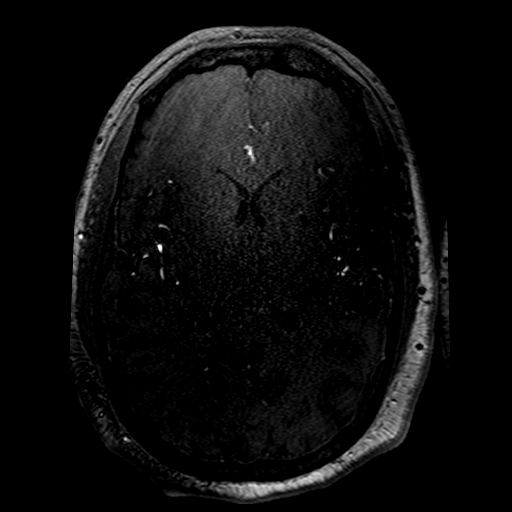
[im 148/148]
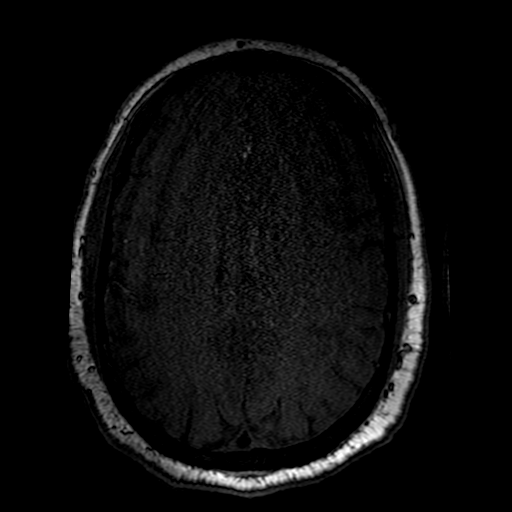

[Series 8: T2 · axial · 5.0mm · 0.43mm/px · z∈[-115,+33]mm · 2 of 27 slices shown (1 of 2)]
[im 1/27]
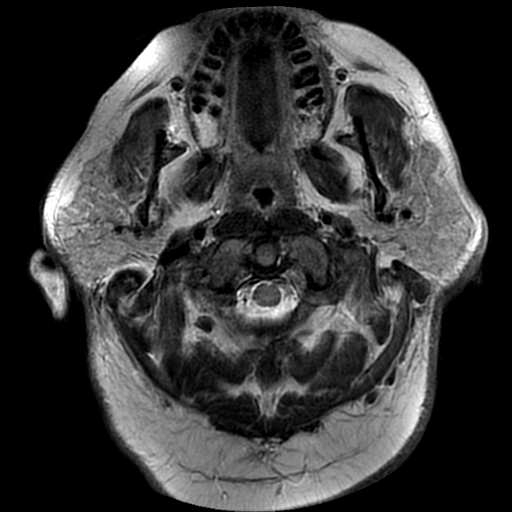
[im 27/27]
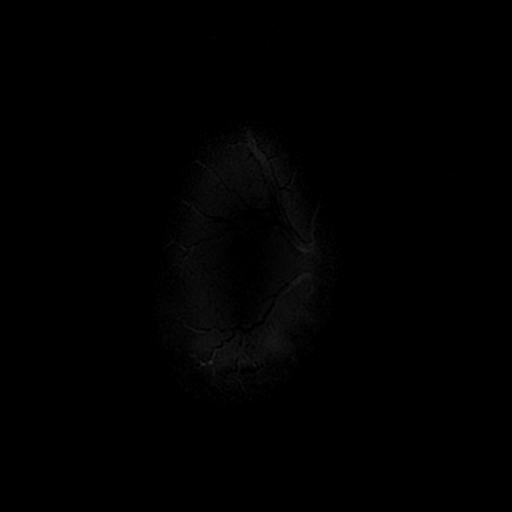

[Series 9: FLAIR · axial · 5.0mm · 0.43mm/px · z∈[-115,+33]mm · 2 of 27 slices shown (2 of 2)]
[im 1/27]
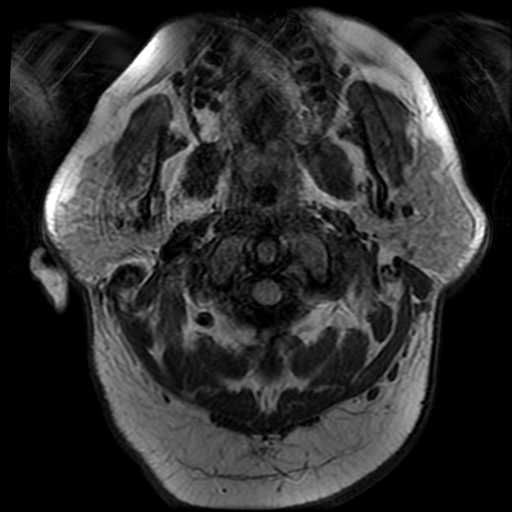
[im 27/27]
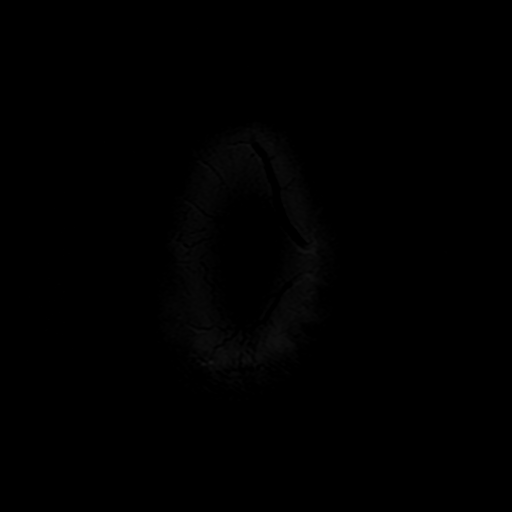

[Series 12: T2 · coronal · 5.0mm · 0.47mm/px · 2 of 37 slices shown (2 of 2)]
[im 1/37]
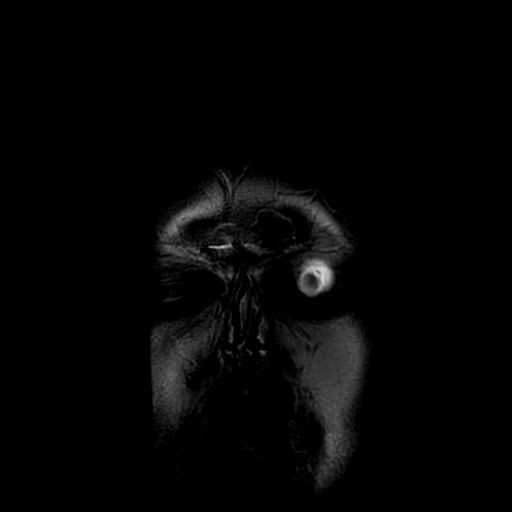
[im 37/37]
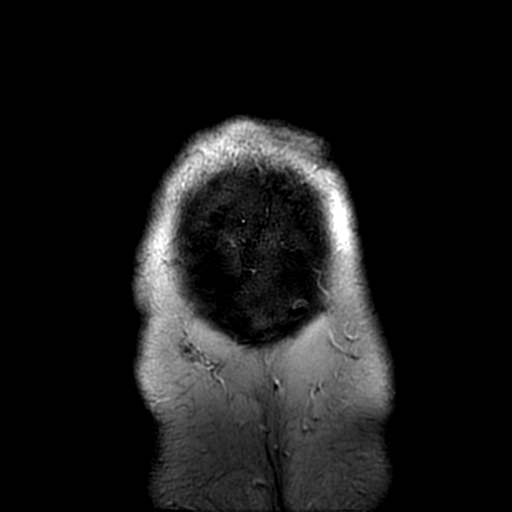

[Series 400: DWI · axial · 3.6mm · 1.02mm/px · z∈[-107,+30]mm · 2 of 41 slices shown (3 of 4)]
[im 1/41]
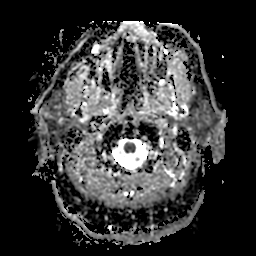
[im 41/41]
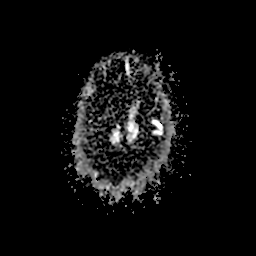

[Series 600: DWI · coronal · 5.0mm · 1.02mm/px · 2 of 36 slices shown (4 of 4)]
[im 1/36]
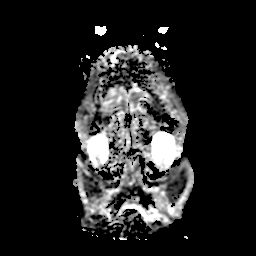
[im 36/36]
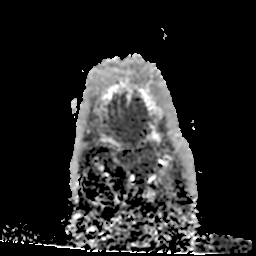

[26 of 48 positions shown; findings below may reference images not displayed]

FINDINGS: MRI HEAD FINDINGS

Some of the sequences are motion degraded.

No acute infarct.

No intracranial hemorrhage.

No hydrocephalus.

No intracranial mass lesion noted on this unenhanced exam.

Decreased signal intensity of bone marrow may be related to
patient's habitus. Correlation with CBC to exclude anemia
contributing to this appearance may be considered.

Cerebellar tonsils minimally low lying but within range of normal
limits. Pituitary region, pineal region and orbital structures
unremarkable.

Minimal to mild paranasal sinus mucosal thickening.

Major intracranial vascular structures are patent.

MRA HEAD FINDINGS

Anterior circulation without medium or large size vessel significant
stenosis or occlusion.

Minimal irregularity of the left internal carotid artery proximal to
the petrous segment may be related to artifact versus minimal
narrowing.

Fetal type contribution to the posterior cerebral arteries
bilaterally.

Ectatic vertebral arteries and basilar artery without high-grade
stenosis.

Poor delineation of the left anterior inferior cerebellar artery.

Narrowing of the P1 segment of the left posterior cerebral artery.
This is proximal to the fetal type contribution to the left
posterior cerebral artery.

Slight irregularity branch vessels.

No aneurysm noted.
IMPRESSION: MRI HEAD

Some of the sequences are motion degraded.

No acute infarct.

No intracranial hemorrhage.

No intracranial mass lesion noted on this unenhanced exam.

Decreased signal intensity of bone marrow may be related to
patient's habitus. Correlation with CBC to exclude anemia
contributing to this appearance may be considered.

Minimal to mild paranasal sinus mucosal thickening.

MRA HEAD FINDINGS

No medium or large size vessel significant stenosis or occlusion.

Please see above.

## 2016-01-13 IMAGING — CT CT ANGIO NECK
1 of 10 series · 5 of 33 positions shown · IV contrast (OMNI 350)
Comparison: MRI 01/10/2015

CLINICAL DATA: TIA.

EXAM:
CT ANGIOGRAPHY HEAD AND NECK
TECHNIQUE: Multidetector CT imaging of the head and neck was performed using
the standard protocol during bolus administration of intravenous
contrast. Multiplanar CT image reconstructions and MIPs were
obtained to evaluate the vascular anatomy. Carotid stenosis
measurements (when applicable) are obtained utilizing NASCET
criteria, using the distal internal carotid diameter as the
denominator.
CONTRAST:  180mL OMNIPAQUE IOHEXOL 350 MG/ML SOLN 2 contrast
injections were made. The first injection was not timed properly and
therefore a second injection was made.

[Series 12: axial 1x1 · axial · 0.37mm/px · z∈[+98,+362]mm · 5 of 396 slices shown]
[im 66/396  soft-tissue]
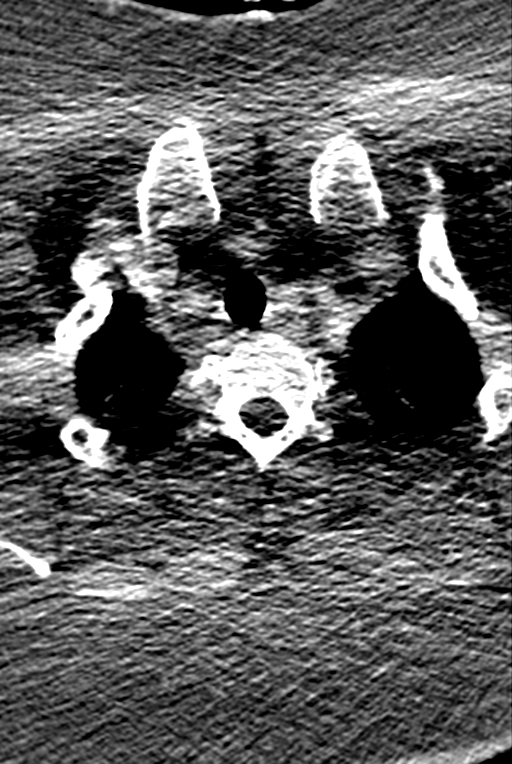
[im 132/396  bone]
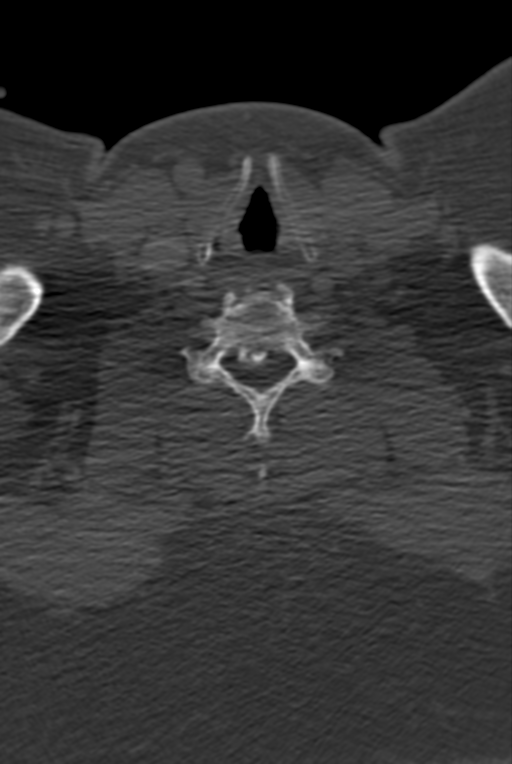
[im 198/396  soft-tissue]
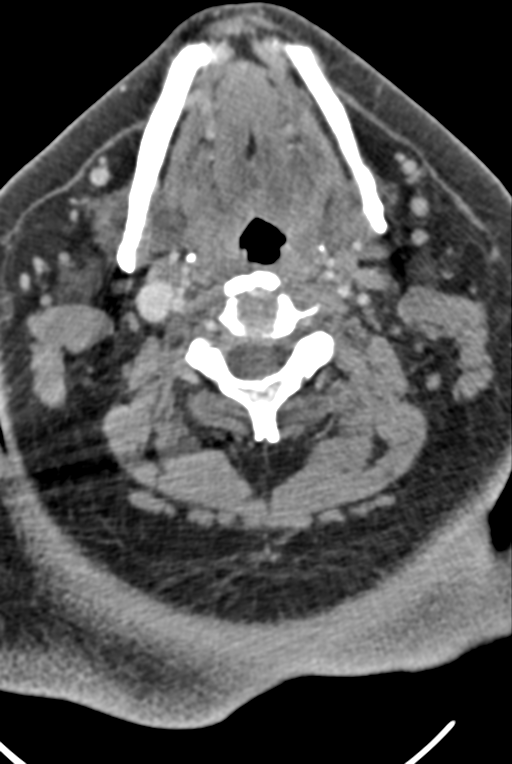
[im 264/396  bone]
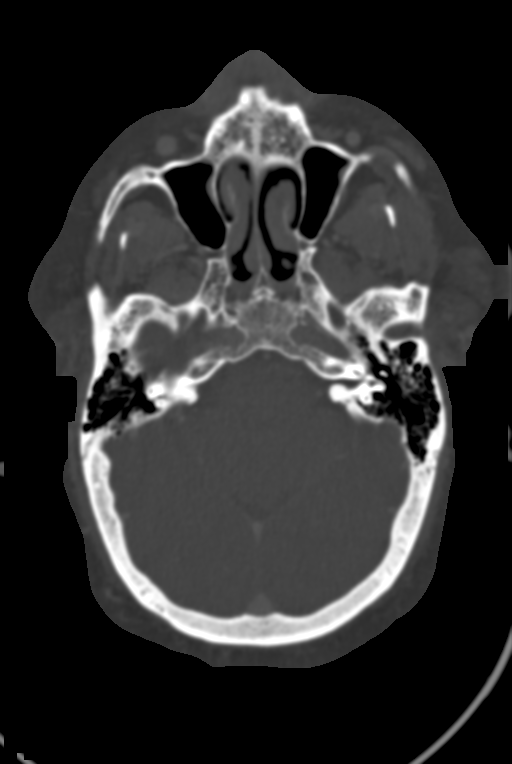
[im 330/396  soft-tissue]
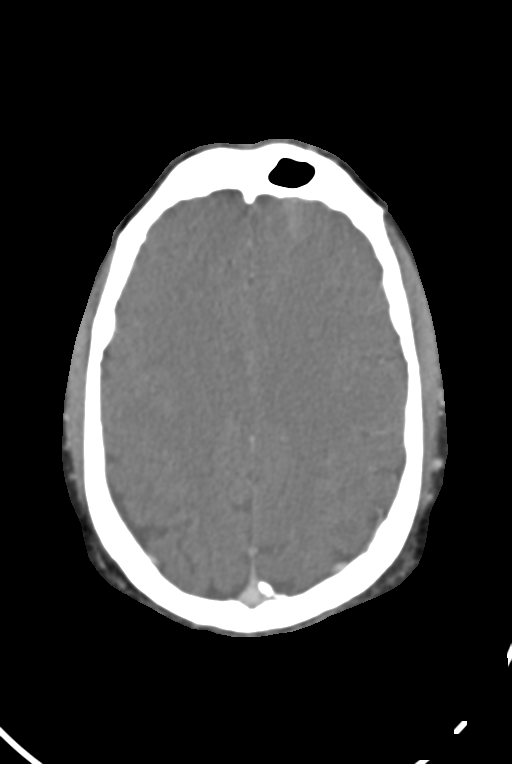

[5 of 33 positions shown; findings below may reference images not displayed]

FINDINGS: CT HEAD

Image quality degraded by a large patient size as well as suboptimal
arterial opacification.

Brain: Ventricle size is normal. Negative for acute or chronic
infarction. Negative for hemorrhage or mass.

Calvarium and skull base: Negative

Paranasal sinuses: Small air-fluid level left maxillary sinus with
mucosal thickening also noted.

Orbits: Negative

CTA NECK

Aortic arch: Not well visualized due to large patient size.

Right carotid system: Right common carotid artery widely patent.
Right carotid bifurcation is normal. No significant atherosclerotic
disease or dissection.

Left carotid system: Left common carotid artery widely patent. Left
carotid bifurcation widely patent. Negative for dissection or
stenosis.

Vertebral arteries:Vertebral arteries are relatively small and not
well seen but appear to be patent to the basilar. There is fetal
origin of the posterior cerebral artery bilaterally leading to
hypoplastic vertebral arteries.

Skeleton: Cervical spondylosis with ossification posterior
longitudinal ligament C3 through C7 leading to spinal stenosis. No
fracture or mass lesion

Other neck:  No adenopathy.

CTA HEAD

Anterior circulation: Internal carotid artery widely patent
bilaterally without stenosis. Anterior and posterior cerebral
arteries patent bilaterally without stenosis.

Posterior circulation: Both vertebral arteries are patent to the
basilar. The basilar is small and hypoplastic but patent. Fetal
origin of the posterior cerebral artery bilaterally with hypoplastic
P1 segments bilaterally. Posterior cerebral arteries are patent
bilaterally.

Venous sinuses: Patent

Anatomic variants: None

Delayed phase: Normal enhancement.

Negative for cerebral aneurysm.
IMPRESSION: Suboptimal study due to large patient size. The patient had two
injections of contrast due to mis timing of the scan on the first
injection.

Negative for significant carotid or vertebral stenosis in the neck.

Negative CTA of the head.

OPLL in the cervical spine with spinal stenosis.

## 2016-01-19 ENCOUNTER — Other Ambulatory Visit: Payer: Self-pay | Admitting: Gastroenterology

## 2016-01-19 DIAGNOSIS — K7469 Other cirrhosis of liver: Secondary | ICD-10-CM

## 2016-02-01 ENCOUNTER — Ambulatory Visit
Admission: RE | Admit: 2016-02-01 | Discharge: 2016-02-01 | Disposition: A | Discharge status: Home / Self Care | Payer: BLUE CROSS/BLUE SHIELD | Source: Ambulatory Visit | Attending: Gastroenterology | Admitting: Gastroenterology

## 2016-02-01 DIAGNOSIS — K7469 Other cirrhosis of liver: Secondary | ICD-10-CM

## 2016-02-19 ENCOUNTER — Encounter: Payer: Self-pay | Admitting: Skilled Nursing Facility1

## 2016-02-19 ENCOUNTER — Encounter: Payer: BLUE CROSS/BLUE SHIELD | Attending: Family Medicine | Admitting: Skilled Nursing Facility1

## 2016-02-19 VITALS — Ht 77.0 in

## 2016-02-19 DIAGNOSIS — E7800 Pure hypercholesterolemia, unspecified: Secondary | ICD-10-CM | POA: Insufficient documentation

## 2016-02-19 DIAGNOSIS — E118 Type 2 diabetes mellitus with unspecified complications: Secondary | ICD-10-CM

## 2016-02-19 DIAGNOSIS — E119 Type 2 diabetes mellitus without complications: Secondary | ICD-10-CM | POA: Insufficient documentation

## 2016-02-19 NOTE — Progress Notes (Signed)
Pt states he has Hepatic encephalopathy and has intentionally  lost 40-50 pounds: since last February was at 520 pounds-by July/August lost 70 pounds but then started gaining it back-so now about 470-480 pounds. Pt states he bounced between 465-480 pounds now about 480 pounds. Pt states he is taking lasix everyday with no acites reported or any disorientation recently. Pt states he is at early stage cirrhosis-stage 1, non alcoholic fatty liver. Pt describes his episodes of encephalopthay as: shaky, not able to focus, not knowing what year it is. Pt states he is on lactulose, 30 morning and night 15 in the middle with 5 flare ups in a year. Pt states controlling his Evening meal is the hardest. Pt states he has 2 kids Kids: 65 years old and 16 years old. Pt states he Stopped drinking soda many years ago. Pt was accompanied by his wife who states she has elevated liver enzymes. Pt states he cooks with salt and shakes salt on his food. While pt was using the restroom Dietitian advised pts wife to stay aware of signs/symptoms and look for appetite loss. Goals: -Honor your hunger and Fullness cues -Ensure your meals and snacks are balanced  -Look up [employer] -Try not to shake any salt -Try Kefir -Try to go for a walk 2-3 days a week for 10 minutes Diabetes Self-Management Education  Visit Type: First/Initial  Appt. Start Time: 2:00 Appt. End Time: 3:30  02/19/2016  [name], identified by name and date of birth, is a 45 y.o. male with a diagnosis of Diabetes: Type 2.   ASSESSMENT  Height 6\' 5"  (1.956 m). There is no weight on file to calculate BMI.      Diabetes Self-Management Education - 02/19/16 1412    Visit Information   Visit Type First/Initial   Initial Visit   Diabetes Type Type 2   Are you currently following a meal plan? No   Are you taking your medications as prescribed? Not on Medications   Date Diagnosed Feb. 2016   Psychosocial Assessment   Patient  Belief/Attitude about Diabetes Motivated to manage diabetes   Self-care barriers None   Self-management support Family;Friends   Patient Concerns Nutrition/Meal planning   Special Needs None   Preferred Learning Style Auditory   Learning Readiness Ready   How often do you need to have someone help you when you read instructions, pamphlets, or other written materials from your doctor or pharmacy? 1 - Never   Complications   Last HgB A1C per patient/outside source 5.7 %   How often do you check your blood sugar? 0 times/day (not testing)   Have you had a dilated eye exam in the past 12 months? Yes   Have you had a dental exam in the past 12 months? Yes   Are you checking your feet? No   Dietary Intake   Breakfast greek yogurt, peanut butter, chocolate almond milk, ice, banana   Lunch turky sandwich, apple, string cheese, greek yogurt   Dinner fruit smoothie----fish, broccoli, potato-----chili----chicken and mushrooms in a wrap    Snack (evening) fruit, cookies-strachy items   Beverage(s) water, diet soda   Exercise   Exercise Type ADL's   Patient Education   Previous Diabetes Education Yes (please comment)  inpatient setting by a dietitian    Individualized Goals (developed by patient)   Nutrition Follow meal plan discussed   Physical Activity Exercise 3-5 times per week;15 minutes per day   Outcomes   Expected Outcomes Demonstrated  interest in learning. Expect positive outcomes   Future DMSE PRN   Program Status Completed      Individualized Plan for Diabetes Self-Management Training:   Learning Objective:  Patient will have a greater understanding of diabetes self-management. Patient education plan is to attend individual and/or group sessions per assessed needs and concerns.   Plan:   Patient Instructions  -Honor your hunger and Fullness cues -Ensure your meals and snacks are balanced  -Look up [employer] -Try not to shake any salt -Try Kefir -Try to go for a walk  2-3 days a week for 10 minutes   Expected Outcomes:  Demonstrated interest in learning. Expect positive outcomes  Education material provided: Living Well with Diabetes, Meal plan card, Snack sheet and No sodium seasonings  If problems or questions, patient to contact team via:  Phone  Future DSME appointment: PRN

## 2016-02-19 NOTE — Patient Instructions (Signed)
-  Honor your hunger and Fullness cues -Ensure your meals and snacks are balanced  -Look up [employer] -Try not to shake any salt -Try Kefir -Try to go for a walk 2-3 days a week for 10 minutes

## 2016-04-15 LAB — LIPID PANEL
CHOLESTEROL: 158 mg/dL (ref 0–200)
HDL: 52 mg/dL (ref 35–70)
LDL CALC: 89 mg/dL
TRIGLYCERIDES: 81 mg/dL (ref 40–160)

## 2016-04-15 LAB — BASIC METABOLIC PANEL
BUN: 13 mg/dL (ref 4–21)
CREATININE: 0.7 mg/dL (ref <=1.3)
GLUCOSE: 86 mg/dL
Potassium: 4.1 mmol/L (ref 3.4–5.3)
Sodium: 136 mmol/L — AB (ref 137–147)

## 2016-04-15 LAB — HEPATIC FUNCTION PANEL
ALK PHOS: 80 U/L (ref 25–125)
ALT: 32 U/L (ref 10–40)
AST: 57 U/L — AB (ref 14–40)
BILIRUBIN, TOTAL: 1.5 mg/dL

## 2016-05-01 ENCOUNTER — Encounter: Payer: Self-pay | Admitting: Adult Health

## 2016-05-01 ENCOUNTER — Ambulatory Visit (INDEPENDENT_AMBULATORY_CARE_PROVIDER_SITE_OTHER): Payer: BLUE CROSS/BLUE SHIELD | Admitting: Adult Health

## 2016-05-01 VITALS — BP 152/68 | HR 78 | Resp 22 | Ht 76.0 in | Wt >= 6400 oz

## 2016-05-01 DIAGNOSIS — G4733 Obstructive sleep apnea (adult) (pediatric): Secondary | ICD-10-CM

## 2016-05-01 DIAGNOSIS — Z9989 Dependence on other enabling machines and devices: Principal | ICD-10-CM

## 2016-05-01 NOTE — Progress Notes (Signed)
PATIENT: Ryan Burgess DOB: 08/03/1971  REASON FOR VISIT: follow up- OSA on CPAP HISTORY FROM: patient  HISTORY OF PRESENT ILLNESS: Ryan Burgess is a 45 year old male with a history of obstructive sleep apnea on CPAP. He returns today for a compliance download. His download indicates that he uses his machine 30 out of 30 days for compliance of 100%. He uses machine greater than 4 hours 30 out of 30 days for compliance of 100%. On average he uses his machine 7 hours and 16 minutes. He is on auto set with a minimum pressure of 5 cm of water and maximum pressure of 15 cm of water with EPR of 3. His residual AHI is 0.9. He has a leak in the 95th percentile at 28.6 L/m. Overall the patient feels that the CPAP has been beneficial. He states that it has decreased his dyatime sleepiness although not completely resolved. He states that he is due for new supplies. He wears a full face mask and also has a beard. He states that sometimes this will cause a leak. He denies any new neurological symptoms. He returns today for an evaluation.  HISTORY (DOHMEIER): Ryan Burgess is a 45 y.o. male seen here as a revisit from Dr. Leonie Man , after a hospitalization on the stroke service.,  Ryan Burgess carries the diagnosis of hypertension, borderline diabetes which in H be A1c of 7.0 12- 2014 and earlier this month at 6.1,  morbid obesity, peripheral edema, hyperlipidemia, thrombocytopenia.  As to the possibility of having sleep apnea, Ryan Burgess is married and his wife has observed his sleep she also has reported to him that he is a loud snorer and she has witnessed apneas as has the hospital staff during his hospitalization. His practitioner and ICU nurse both stated that he had witnessed sleep apnea is doing his hospital stay. These were associated with significant desaturations in oxygen and the possibility of CO2 retention was raised in a setting that is usually described as obesity hypoventilation  syndrome. They found Ryan Burgess to be a shallow breather and in relation to his body mass index he may not be able to exhale CO2 and exchange it for oxygen. The retention of CO2 also called hypercapnia can then lead to confusion, global headaches, and significant nocturia of his enuresis.  The patient goes to bed between 10 and 11 PM and usually does not have trouble to fall asleep. His sleep latency is between 15 and 30 minutes usually. He would have usually only one bathroom break at night about 1:59 AM and he recalls that dreams stages seemed to start after the bathroom break so it is likely that his REM sleep takes place in the later night or early morning hours. 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 The patient prefers to sleep on his side, and he wakes up on his side as well. His alarm clock is usually set for 6 AM but his wife states that for 5 AM and he often wakes up with that already. He wakes up most mornings with a morning headache that resolves as the day goes off on, also a symptom of hypercapnia. He does not necessarily feel refreshed or restored in the morning.  He would get an average of 6 hours of sleep at night he rarely naps in daytime letting the opportunity to do so.  After lunch she is usually quite sleepy. He endorsed the Epworth sleepiness score at 18 points of the fatigue severity score at 33 points. On weekends he also can sleep in but usually doesn't feel more restored from the additional hour of sleep. The patient does not smoke and uses no tobacco products as all. The patient drinks only socially not regularly he endorsed 1-2 drinks every 3 months. He does drink caffeine 1 cup or soda on some days . The family history positive for sleep apnea in his father, grandfather did have witnessed apnea.  No ENT surgery, neck surgery or trauma reported.  He is not a shift  worker, regularly works from 8.30 AM to 5 o'clock PM.  Interval history from 05-02-15. Ryan Burgess returns after having undergone a split night polysomnography on 3-20 2-16, he had endorsed the Epworth sleepiness score at 18 points. His BMI exceeded 60 and he was scheduled for a capnography alongside his polysomnography. He was diagnosed with an AHI of 33.1 , REM sleep was noted and no periodic limb movements. He was titrated to 9 cm water pressure and ordered to follow-up with an auto set CPAP.  CPAP was chosen to treat this apnea due to his oxygen nadir at 81% was 91.8 minutes of desaturations, loud snoring had also been noticed.  Ryan Burgess now uses an auto Pap and has noticed that he sleeps through the night he may have one bathroom break but not several, his wife no longer noticed him to snore. He feels that the sleep is more refreshing and restorative. The patient used to doze off when he was a passenger in the car driving to work in the morning with his wife. He no longer feels he needs to do this. He endorsed the Epworth sleepiness score today at 8 points down from 18 and the fatigue severity at 22 was greatly reduced.  Compliance record the patient used CPAP !00% of the last 30 days or nights, and over 4 hours each of those nights. His average user time is 6 hours and 26 minutes. AUTO set machine allows a pressure window between 5 and 15 cm water and a 3 cm EPR. The residual AHI is 1.6 which is a great resolution. The 95th present pressure is 13.5 cm water the 95th percentile airleak is 15.4 L.  The patient prefers a full face mask but has facial hair and there will never be an complete air seal achieved but he feels that this is the most comfortable solution for him. I have no reason to change the settings or adjust anything today the patient is comfortable and actually sees a benefit from the therapy. He saw Dr. Pearlean Brownie just last week and has been released from the TIA  clinic.  11-21-15: Compliance record for CPAP is excellent. The patient had some trouble using his CPAP which cannot be related to a cracked mask. He received a new model from our sleep lab here today gratis. [facility] did perform an overnight pulse oximetry but on room air not on CPAP. I have reordered the test. The room air study does document need for oxygen supplementation but does also not clear if this finding would persist while on CPAP. He will repeat the study.  REVIEW OF SYSTEMS: Out of a complete 14 system review of symptoms, the patient complains only of the following symptoms, and all other reviewed systems are negative.  Leg swelling, insomnia, apnea, frequent waking, daytime sleepiness, snoring, diarrhea, joint pain, joint  swelling, weakness, tremors  ALLERGIES: No Known Allergies  HOME MEDICATIONS: Outpatient Prescriptions Prior to Visit  Medication Sig Dispense Refill  . acetaminophen (TYLENOL) 500 MG tablet Take 1,000 mg by mouth every 6 (six) hours as needed for headache.    Marland Kitchen aspirin EC 81 MG EC tablet Take 1 tablet (81 mg total) by mouth daily. 30 tablet 11  . atorvastatin (LIPITOR) 20 MG tablet Take 1 tablet (20 mg total) by mouth at bedtime. 30 tablet 5  . blood glucose meter kit and supplies KIT Dispense based on patient and insurance preference. Use up to four times daily as directed. (FOR ICD-9 250.00, 250.01). 1 each 0  . dexlansoprazole (DEXILANT) 60 MG capsule Take 60 mg by mouth daily.    . furosemide (LASIX) 20 MG tablet Take 1 tablet (20 mg total) by mouth daily. 30 tablet 6  . lactulose (CHRONULAC) 10 GM/15ML solution Take 15 mLs (10 g total) by mouth 2 (two) times daily. 240 mL 1  . loratadine (CLARITIN) 10 MG tablet Take 10 mg by mouth daily as needed for allergies.    . Multiple Vitamin (MULTIVITAMIN WITH MINERALS) TABS tablet Take 1 tablet by mouth daily.    . phenylephrine (SUDAFED PE) 10 MG TABS tablet Take 10 mg by mouth every 4 (four) hours as  needed (for congestion).      No facility-administered medications prior to visit.    PAST MEDICAL HISTORY: Past Medical History  Diagnosis Date  . GERD (gastroesophageal reflux disease)   . Obesity   . Hypertension   . Snoring   . TIA (transient ischemic attack)   . High cholesterol   . Sleep apnea     uses CPAP  . Kidney stones 01/2015    UTI    PAST SURGICAL HISTORY: Past Surgical History  Procedure Laterality Date  . Mouth surgery    . Tee without cardioversion N/A 04/03/2015    Procedure: TRANSESOPHAGEAL ECHOCARDIOGRAM (TEE);  Surgeon: Adrian Prows, MD;  Location: [facility] ENDOSCOPY;  Service: Cardiovascular;  Laterality: N/A;  . Esophagogastroduodenoscopy (egd) with propofol N/A 07/26/2015    Procedure: ESOPHAGOGASTRODUODENOSCOPY (EGD) WITH PROPOFOL;  Surgeon: Arta Silence, MD;  Location: [facility] ENDOSCOPY;  Service: Endoscopy;  Laterality: N/A;    FAMILY HISTORY: Family History  Problem Relation Age of Onset  . Leukemia Mother   . Heart disease Maternal Grandmother   . Heart disease Paternal Grandmother   . Diabetes Father   . Dementia Paternal Grandmother   . COPD Maternal Grandmother     SOCIAL HISTORY: Social History   Social History  . Marital Status: Married    Spouse Name: Hoyle Sauer  . Number of Children: 2  . Years of Education: masters   Occupational History  . Not on file.   Social History Main Topics  . Smoking status: Never Smoker   . Smokeless tobacco: Never Used  . Alcohol Use: 0.6 - 1.2 oz/week    1-2 Standard drinks or equivalent per week     Comment: every three months  . Drug Use: No  . Sexual Activity: Not on file   Other Topics Concern  . Not on file   Social History Narrative   Patient lives at home with his wife Hoyle Sauer) and two children.    Patient works full time at [employer] in [place].   Right handed.   Caffeine 1-2 sodas and coffee daily.      PHYSICAL EXAM  Filed Vitals:  05/01/16 0801  BP: 152/68  Pulse: 78  Resp: 22  Height: '6\' 4"'$  (1.93 m)  Weight: 492 lb (223.17 kg)   Body mass index is 59.91 kg/(m^2).  Generalized: Well developed, in no acute distress, obese NECK: Circumference 19-1/2 inches, Mallampati 3+  Neurological examination  Mentation: Alert oriented to time, place, history taking. Follows all commands speech and language fluent Cranial nerve II-XII: Pupils were equal round reactive to light. Extraocular movements were full, visual field were full on confrontational test. Facial sensation and strength were normal. Uvula tongue midline. Head turning and shoulder shrug  were normal and symmetric. Motor: The motor testing reveals 5 over 5 strength of all 4 extremities. Good symmetric motor tone is noted throughout.  Sensory: Sensory testing is intact to soft touch on all 4 extremities. No evidence of extinction is noted.  Coordination: Cerebellar testing reveals good finger-nose-finger and heel-to-shin bilaterally.  Gait and station: Gait is normal. Tandem gait is normal. Romberg is negative. No drift is seen.  Reflexes: Deep tendon reflexes are symmetric and normal bilaterally.   DIAGNOSTIC DATA (LABS, IMAGING, TESTING) - I reviewed patient records, labs, notes, testing and imaging myself where available.  Lab Results  Component Value Date   WBC 3.8* 11/20/2015   HGB 12.4* 11/20/2015   HCT 35.8* 11/20/2015   MCV 94.2 11/20/2015   PLT 95* 11/20/2015      Component Value Date/Time   NA 141 11/19/2015 1257   NA 143 01/09/2015 1413   K 4.3 11/19/2015 1257   K 3.8 01/09/2015 1413   CL 108 11/19/2015 1257   CL 111* 01/09/2015 1413   CO2 23 11/19/2015 1248   CO2 22 01/09/2015 1413   GLUCOSE 127* 11/19/2015 1257   GLUCOSE 115* 01/09/2015 1413   BUN 14 11/19/2015 1257   BUN 15 01/09/2015 1413   CREATININE 0.70 11/19/2015 1257   CREATININE 0.66 01/09/2015 1413   CALCIUM 8.7* 11/19/2015 1248   CALCIUM 8.1* 01/09/2015 1413   PROT 6.6  11/19/2015 1248   PROT 6.4 01/09/2015 1413   ALBUMIN 2.7* 11/19/2015 1248   ALBUMIN 2.4* 01/09/2015 1413   AST 54* 11/19/2015 1248   AST 58* 01/09/2015 1413   ALT 32 11/19/2015 1248   ALT 38 01/09/2015 1413   ALKPHOS 72 11/19/2015 1248   ALKPHOS 94 01/09/2015 1413   BILITOT 1.2 11/19/2015 1248   BILITOT 0.8 01/09/2015 1413   GFRNONAA >60 11/19/2015 1248   GFRNONAA >60 01/09/2015 1413   GFRAA >60 11/19/2015 1248   GFRAA >60 01/09/2015 1413   Lab Results  Component Value Date   CHOL 113 11/20/2015   HDL 40* 11/20/2015   LDLCALC 64 11/20/2015   TRIG 44 11/20/2015   CHOLHDL 2.8 11/20/2015   Lab Results  Component Value Date   HGBA1C 5.7* 11/20/2015      ASSESSMENT AND PLAN 45 y.o. year old male  has a past medical history of GERD (gastroesophageal reflux disease); Obesity; Hypertension; Snoring; TIA (transient ischemic attack); High cholesterol; Sleep apnea; and Kidney stones (01/2015). here with:  1. OSA on CPAP  Overall the patient's compliance is excellent. He should continue using CPAP nightly. He is instructed to change out his supplies. Patient voiced understanding. He will follow-up in 6 months or sooner if needed.     Ward Givens, MSN, NP-C 05/01/2016, 7:56 AM [facility] [address], [place], [place] [address] [phone]

## 2016-05-01 NOTE — Progress Notes (Signed)
I agree with the assessment and plan of OSA Care as directed by NP .The patient is known to me .   Keyerra Lamere, MD

## 2016-05-01 NOTE — Patient Instructions (Signed)
Change out supplies Continue to use CPAP nightly If your symptoms worsen or you develop new symptoms please let us know.

## 2016-06-14 ENCOUNTER — Encounter (HOSPITAL_COMMUNITY): Payer: Self-pay | Admitting: *Deleted

## 2016-06-14 ENCOUNTER — Emergency Department (HOSPITAL_COMMUNITY)
Admission: EM | Admit: 2016-06-14 | Discharge: 2016-06-14 | Disposition: A | Discharge status: Home / Self Care | Payer: BLUE CROSS/BLUE SHIELD | Attending: Emergency Medicine | Admitting: Emergency Medicine

## 2016-06-14 DIAGNOSIS — R4182 Altered mental status, unspecified: Secondary | ICD-10-CM | POA: Diagnosis present

## 2016-06-14 DIAGNOSIS — E669 Obesity, unspecified: Secondary | ICD-10-CM | POA: Diagnosis not present

## 2016-06-14 DIAGNOSIS — Z79899 Other long term (current) drug therapy: Secondary | ICD-10-CM | POA: Insufficient documentation

## 2016-06-14 DIAGNOSIS — Z7982 Long term (current) use of aspirin: Secondary | ICD-10-CM | POA: Insufficient documentation

## 2016-06-14 DIAGNOSIS — I10 Essential (primary) hypertension: Secondary | ICD-10-CM | POA: Diagnosis not present

## 2016-06-14 DIAGNOSIS — K7682 Hepatic encephalopathy: Secondary | ICD-10-CM

## 2016-06-14 DIAGNOSIS — Z8673 Personal history of transient ischemic attack (TIA), and cerebral infarction without residual deficits: Secondary | ICD-10-CM | POA: Insufficient documentation

## 2016-06-14 DIAGNOSIS — Z6841 Body Mass Index (BMI) 40.0 and over, adult: Secondary | ICD-10-CM | POA: Diagnosis not present

## 2016-06-14 DIAGNOSIS — K7290 Hepatic failure, unspecified without coma: Secondary | ICD-10-CM | POA: Diagnosis not present

## 2016-06-14 HISTORY — DX: Unspecified cirrhosis of liver: K74.60

## 2016-06-14 LAB — HEPATIC FUNCTION PANEL
ALBUMIN: 2.5 g/dL — AB (ref 3.5–5.0)
ALK PHOS: 84 U/L (ref 38–126)
ALT: 34 U/L (ref 17–63)
AST: 67 U/L — ABNORMAL HIGH (ref 15–41)
BILIRUBIN TOTAL: 1 mg/dL (ref 0.3–1.2)
Bilirubin, Direct: 0.4 mg/dL (ref 0.1–0.5)
Indirect Bilirubin: 0.6 mg/dL (ref 0.3–0.9)
Total Protein: 6.1 g/dL — ABNORMAL LOW (ref 6.5–8.1)

## 2016-06-14 LAB — BASIC METABOLIC PANEL
ANION GAP: 5 (ref 5–15)
BUN: 9 mg/dL (ref 6–20)
CHLORIDE: 110 mmol/L (ref 101–111)
CO2: 21 mmol/L — AB (ref 22–32)
Calcium: 8.5 mg/dL — ABNORMAL LOW (ref 8.9–10.3)
Creatinine, Ser: 0.69 mg/dL (ref 0.61–1.24)
GFR calc non Af Amer: >60 mL/min (ref >60)
GLUCOSE: 138 mg/dL — AB (ref 65–99)
Potassium: 3.9 mmol/L (ref 3.5–5.1)
Sodium: 136 mmol/L (ref 135–145)

## 2016-06-14 LAB — AMMONIA: AMMONIA: 208 umol/L — AB (ref 9–35)

## 2016-06-14 LAB — I-STAT VENOUS BLOOD GAS, ED
BICARBONATE: 24.1 meq/L — AB (ref 20.0–24.0)
O2 Saturation: 67 %
PO2 VEN: 34 mmHg (ref 31.0–45.0)
TCO2: 25 mmol/L (ref 0–100)
pCO2, Ven: 38.2 mmHg — ABNORMAL LOW (ref 45.0–50.0)
pH, Ven: 7.409 — ABNORMAL HIGH (ref 7.250–7.300)

## 2016-06-14 LAB — CBC
HEMATOCRIT: 41.6 % (ref 39.0–52.0)
Hemoglobin: 14.4 g/dL (ref 13.0–17.0)
MCH: 32.4 pg (ref 26.0–34.0)
MCHC: 34.6 g/dL (ref 30.0–36.0)
MCV: 93.7 fL (ref 78.0–100.0)
Platelets: 90 10*3/uL — ABNORMAL LOW (ref 150–400)
RBC: 4.44 MIL/uL (ref 4.22–5.81)
RDW: 14.8 % (ref 11.5–15.5)
WBC: 3.1 10*3/uL — ABNORMAL LOW (ref 4.0–10.5)

## 2016-06-14 MED ORDER — RIFAXIMIN 550 MG PO TABS: 550.0000 mg | ORAL_TABLET | Freq: Two times a day (BID) | ORAL | Status: AC

## 2016-06-14 NOTE — ED Notes (Signed)
Pt in from home, per wife the pt has intermittent hepatic encephalopathy r/t non alcoholic cirrhosis, pt takes Lactulose TID, pt reported to have unsteady gait & confusion onset last night with shaky hands, hx of the same symptoms in Dec 2016 when he was admitted for symptoms, pt reports bil hand tremors, denies SOB & CP, pt A&O x4

## 2016-06-14 NOTE — Discharge Instructions (Signed)
Please increase your lactulose to 2 times daily or 3 times daily as tolerated. You'll start the new medication prescribed and take twice daily and follow-up with Dr. Paulita Fujita on Monday.   Hepatic Encephalopathy Hepatic encephalopathy is a loss of brain function from advanced liver disease. The effects of the condition depend on the type of liver damage and how severe it is. In some cases, hepatic encephalopathy can be reversed. CAUSES The exact cause of hepatic encephalopathy is not known. RISK FACTORS You have a higher risk of getting this condition if your liver is damaged. When the liver is damaged harmful substances called toxins can build up in the body. Certain toxins, such as ammonia, can harm your brain. Conditions that can cause liver damage include:  An infection.  Dehydration.  Intestinal bleeding.  Drinking too much alcohol.  Taking certain medicines, including tranquilizers, water pills (diuretics), antidepressants, or sleeping pills. SIGNS AND SYMPTOMS Signs and symptoms may develop suddenly. Or, they may develop slowly and get worse gradually. Symptoms can range from mild to severe. Mild Hepatic Encephalopathy  Mild confusion.  Personality and mood changes.  Anxiety and agitation.  Drowsiness.  Loss of mental abilities.  Musty or sweet-smelling breath. Worsening or Severe Hepatic Encephalopathy  Slowed movement.  Slurred speech.  Extreme personality changes.  Disorientation.  Abnormal shaking or flapping of the hands.  Coma. DIAGNOSIS To make a diagnosis, your health care provider will do a physical exam. To rule out other causes of your signs and symptoms, he or she may order tests. You may have:  Blood tests. These may be done to check your ammonia level, measure how long it takes your blood to clot, and check for infection.  Liver function tests. These may be done to check how well your liver is working.  MRI and CT scans. These may be done to  check for a brain disorder.  Electroencephalogram (EEG). This may be done to measure the electrical activity in your brain. TREATMENT The first step in treatment is identifying and treating possible triggers. The next step is involves taking medicine to lower the level of toxins in the body and to prevent ammonia from building up. You may need to take:  Antibiotics to reduce the ammonia-producing bacteria in your gut.  Lactulose to help flush ammonia from the gut. HOME CARE INSTRUCTIONS Eating and Drinking  Follow a low-protein diet that includes plenty of fruits, vegetables, and whole grains, as directed by your health care provider. Ammonia is produced when you digest high-protein foods.  Work with a Microbiologist or with your health care provider to make sure you are getting the right balance of protein and minerals.  Drink enough fluids to keep your urine clear or pale yellow. Drinking plenty of water helps prevent constipation.  Do not drink alcohol or use illegal drugs. Medicines  Only take medicine as directed by your health care provider.  If you were prescribed an antibiotic medicine, finish it all even if you start to feel better.  Do not start any new medicines, including over-the-counter medicines, without first checking with your health care provider. SEEK MEDICAL CARE IF:  You have new symptoms.  Your symptoms change.  Your symptoms get worse.  You have a fever.  You are constipated.  You have persistent nausea, vomiting, or diarrhea. SEEK IMMEDIATE MEDICAL CARE IF:  You become very confused or drowsy.  You vomit blood or material that looks like coffee grounds.  Your stool is bloody or black or looks  like tar.   This information is not intended to replace advice given to you by your health care provider. Make sure you discuss any questions you have with your health care provider.   Document Released: 01/28/2007 Document Revised: 12/09/2014 Document  Reviewed: 07/06/2014 Elsevier Interactive Patient Education [employer].

## 2016-06-14 NOTE — ED Notes (Signed)
No complaints

## 2016-06-14 NOTE — ED Provider Notes (Signed)
CSN: [id number]     Arrival date & time 06/14/16  1253 History   First MD Initiated Contact with Patient 06/14/16 1434     Chief Complaint  Patient presents with  . Altered Mental Status  . Gait Problem     (Consider location/radiation/quality/duration/timing/severity/associated sxs/prior Treatment) Patient is a 45 y.o. male presenting with altered mental status. The history is provided by the patient and the spouse.  Altered Mental Status Presenting symptoms: confusion and lethargy   Severity:  Moderate Most recent episode:  Today Episode history:  Continuous Timing:  Constant Progression:  Unchanged Chronicity:  Recurrent Context: not alcohol use and taking medications as prescribed   Associated symptoms: no abdominal pain, no fever, no light-headedness, no nausea, no palpitations, no rash, no vomiting and no weakness     Past Medical History  Diagnosis Date  . GERD (gastroesophageal reflux disease)   . Obesity   . Hypertension   . Snoring   . TIA (transient ischemic attack)   . High cholesterol   . Sleep apnea     uses CPAP  . Kidney stones 01/2015    UTI  . Cirrhosis, nonalcoholic [facility])    Past Surgical History  Procedure Laterality Date  . Mouth surgery    . Tee without cardioversion N/A 04/03/2015    Procedure: TRANSESOPHAGEAL ECHOCARDIOGRAM (TEE);  Surgeon: Adrian Prows, MD;  Location: [facility] ENDOSCOPY;  Service: Cardiovascular;  Laterality: N/A;  . Esophagogastroduodenoscopy (egd) with propofol N/A 07/26/2015    Procedure: ESOPHAGOGASTRODUODENOSCOPY (EGD) WITH PROPOFOL;  Surgeon: Arta Silence, MD;  Location: [facility] ENDOSCOPY;  Service: Endoscopy;  Laterality: N/A;   Family History  Problem Relation Age of Onset  . Leukemia Mother   . Heart disease Maternal Grandmother   . Heart disease Paternal Grandmother   . Diabetes Father   . Dementia Paternal Grandmother   . COPD Maternal Grandmother    Social History  Substance Use Topics  . Smoking status: Never Smoker   .  Smokeless tobacco: Never Used  . Alcohol Use: 0.6 - 1.2 oz/week    1-2 Standard drinks or equivalent per week     Comment: every three months    Review of Systems  Constitutional: Negative for fever and chills.  HENT: Negative for congestion and sore throat.   Eyes: Negative for pain.  Respiratory: Negative for cough and shortness of breath.   Cardiovascular: Negative for chest pain and palpitations.  Gastrointestinal: Negative for nausea, vomiting, abdominal pain and diarrhea.  Endocrine: Negative.   Genitourinary: Negative for flank pain.  Musculoskeletal: Negative for back pain and neck pain.  Skin: Negative for rash.  Allergic/Immunologic: Negative.   Neurological: Positive for tremors. Negative for dizziness, syncope, weakness and light-headedness.  Psychiatric/Behavioral: Positive for confusion.      Allergies  Review of patient's allergies indicates no known allergies.  Home Medications   Prior to Admission medications   Medication Sig Start Date End Date Taking? Authorizing Provider  acetaminophen (TYLENOL) 500 MG tablet Take 1,000 mg by mouth every 6 (six) hours as needed for headache.   Yes Historical Provider, MD  aspirin EC 81 MG EC tablet Take 1 tablet (81 mg total) by mouth daily. 01/10/15  Yes Ripudeep Krystal Eaton, MD  atorvastatin (LIPITOR) 20 MG tablet Take 1 tablet (20 mg total) by mouth at bedtime. 01/10/15  Yes Ripudeep Krystal Eaton, MD  dexlansoprazole (DEXILANT) 60 MG capsule Take 60 mg by mouth daily.   Yes Historical Provider, MD  furosemide (LASIX) 20 MG tablet  Take 1 tablet (20 mg total) by mouth daily. 01/10/15  Yes Ripudeep Krystal Eaton, MD  lactulose, encephalopathy, (CHRONULAC) 10 GM/15ML SOLN Take 45 mls by mouth 3 times a day 04/24/16  Yes Historical Provider, MD  loratadine (CLARITIN) 10 MG tablet Take 10 mg by mouth daily as needed for allergies.   Yes Historical Provider, MD  Multiple Vitamin (MULTIVITAMIN WITH MINERALS) TABS tablet Take 1 tablet by mouth daily.   Yes  Historical Provider, MD  phenylephrine (SUDAFED PE) 10 MG TABS tablet Take 10 mg by mouth every 4 (four) hours as needed (for congestion).    Yes Historical Provider, MD  blood glucose meter kit and supplies KIT Dispense based on patient and insurance preference. Use up to four times daily as directed. (FOR ICD-9 250.00, 250.01). 01/11/15   Ripudeep Krystal Eaton, MD  rifaximin (XIFAXAN) 550 MG TABS tablet Take 1 tablet (550 mg total) by mouth 2 (two) times daily. 06/14/16   Geronimo Boot, MD   BP 137/53 mmHg  Pulse 72  Temp(Src) 98 F (36.7 C) (Oral)  Resp 18  Ht 6' 5" (1.956 m)  Wt 218.634 kg  BMI 57.15 kg/m2  SpO2 99% Physical Exam  Constitutional: He is oriented to person, place, and time. He appears well-developed and well-nourished.  HENT:  Head: Normocephalic and atraumatic.  Eyes: Conjunctivae and EOM are normal. Pupils are equal, round, and reactive to light.  Neck: Normal range of motion. Neck supple.  Cardiovascular: Normal rate, regular rhythm, normal heart sounds and intact distal pulses.   Pulmonary/Chest: Effort normal and breath sounds normal. No respiratory distress.  Abdominal: Soft. Bowel sounds are normal. There is no tenderness.  Musculoskeletal: Normal range of motion.  Neurological: He is alert and oriented to person, place, and time. He has normal reflexes. He displays tremor. No cranial nerve deficit or sensory deficit. He displays a negative Romberg sign. GCS eye subscore is 4. GCS verbal subscore is 5. GCS motor subscore is 6.  Normal finger to nose bilaterally.   No pronator drift bilaterally.   Asterixis  Skin: Skin is warm and dry.    ED Course  Procedures (including critical care time) Labs Review Labs Reviewed  BASIC METABOLIC PANEL - Abnormal; Notable for the following:    CO2 21 (*)    Glucose, Bld 138 (*)    Calcium 8.5 (*)    All other components within normal limits  CBC - Abnormal; Notable for the following:    WBC 3.1 (*)    Platelets 90 (*)     All other components within normal limits  AMMONIA - Abnormal; Notable for the following:    Ammonia 208 (*)    All other components within normal limits  HEPATIC FUNCTION PANEL - Abnormal; Notable for the following:    Total Protein 6.1 (*)    Albumin 2.5 (*)    AST 67 (*)    All other components within normal limits  I-STAT VENOUS BLOOD GAS, ED - Abnormal; Notable for the following:    pH, Ven 7.409 (*)    pCO2, Ven 38.2 (*)    Bicarbonate 24.1 (*)    All other components within normal limits  BLOOD GAS, VENOUS    Imaging Review No results found. I have personally reviewed and evaluated these images and lab results as part of my medical decision-making.   EKG Interpretation   Date/Time:  Friday June 14 2016 13:05:11 EDT Ventricular Rate:  84 PR Interval:  178 QRS Duration: 88  QT Interval:  414 QTC Calculation: 489 R Axis:   51 Text Interpretation:  Normal sinus rhythm Artifact lead v2 No significant  change since last tracing Confirmed by KNOTT MD, DANIEL ([id number]) on  06/14/2016 3:59:12 PM      MDM   Final diagnoses:  Encephalopathy, hepatic (HCC)    Pt is a 45 yo male with a  Hx of NASH and previous episodes of hepatic encephalopathy presenting for increasing confusion, tremors, and altered gait.  Reports taking double dose of lactulose this morning with some alleviation.   On exam pt HDS in NAD.  Normal neuro exam except mild tremor and astirixis. Doubt TIA/stroke. A&O x 3.  Ammonia elevated at 200.  Labs otherwise unremarkable.  Able to ambulate easily in the ED.  Wife reports feeling tremors and mild confusion decreasing.  CBC and CMP otherwise unremarkable.  Discussed with the on-call [facility] gastroenterology physician who upon description and review of labs feels patient is likely candidate for outpatient therapy. Recommended starting rifaximin twice a day.  Discussed in depth with family who agree to plan and feel comfortable returning home.  The pt is to call and  follow up with Dr. Paulita Fujita on Monday.   Labs were viewed by myself  incorporated into medical decision making.  Discussed pertinent finding with patient or caregiver prior to discharge with no further questions.  Immediate return precautions given and understood.  Medical decision making supervised by my attending Dr. Laneta Simmers.   Geronimo Boot, MD PGY-3 Emergency Medicine     Geronimo Boot, MD 06/14/16 9798  Leo Grosser, MD 06/15/16 [phone]

## 2016-06-28 ENCOUNTER — Other Ambulatory Visit: Payer: Self-pay | Admitting: Gastroenterology

## 2016-06-28 DIAGNOSIS — K7460 Unspecified cirrhosis of liver: Secondary | ICD-10-CM

## 2016-06-28 DIAGNOSIS — K7682 Hepatic encephalopathy: Secondary | ICD-10-CM

## 2016-06-28 DIAGNOSIS — K7290 Hepatic failure, unspecified without coma: Secondary | ICD-10-CM

## 2016-07-08 ENCOUNTER — Ambulatory Visit
Admission: RE | Admit: 2016-07-08 | Discharge: 2016-07-08 | Disposition: A | Discharge status: Home / Self Care | Payer: BLUE CROSS/BLUE SHIELD | Source: Ambulatory Visit | Attending: Gastroenterology | Admitting: Gastroenterology

## 2016-07-08 DIAGNOSIS — K7460 Unspecified cirrhosis of liver: Secondary | ICD-10-CM

## 2016-07-08 DIAGNOSIS — K7682 Hepatic encephalopathy: Secondary | ICD-10-CM

## 2016-07-08 DIAGNOSIS — K7290 Hepatic failure, unspecified without coma: Secondary | ICD-10-CM

## 2016-10-31 ENCOUNTER — Ambulatory Visit: Payer: BLUE CROSS/BLUE SHIELD | Admitting: Nurse Practitioner

## 2016-12-19 ENCOUNTER — Inpatient Hospital Stay (HOSPITAL_COMMUNITY)
Admission: EM | Admit: 2016-12-19 | Discharge: 2016-12-23 | DRG: 872 | Disposition: A | Discharge status: Home / Self Care | Payer: BLUE CROSS/BLUE SHIELD | Attending: Internal Medicine | Admitting: Internal Medicine

## 2016-12-19 ENCOUNTER — Encounter (HOSPITAL_COMMUNITY): Payer: Self-pay

## 2016-12-19 DIAGNOSIS — Z806 Family history of leukemia: Secondary | ICD-10-CM | POA: Diagnosis not present

## 2016-12-19 DIAGNOSIS — L03115 Cellulitis of right lower limb: Secondary | ICD-10-CM | POA: Diagnosis present

## 2016-12-19 DIAGNOSIS — E662 Morbid (severe) obesity with alveolar hypoventilation: Secondary | ICD-10-CM | POA: Diagnosis present

## 2016-12-19 DIAGNOSIS — E871 Hypo-osmolality and hyponatremia: Secondary | ICD-10-CM | POA: Diagnosis present

## 2016-12-19 DIAGNOSIS — Z825 Family history of asthma and other chronic lower respiratory diseases: Secondary | ICD-10-CM

## 2016-12-19 DIAGNOSIS — E7800 Pure hypercholesterolemia, unspecified: Secondary | ICD-10-CM | POA: Diagnosis present

## 2016-12-19 DIAGNOSIS — A419 Sepsis, unspecified organism: Principal | ICD-10-CM | POA: Diagnosis present

## 2016-12-19 DIAGNOSIS — R600 Localized edema: Secondary | ICD-10-CM

## 2016-12-19 DIAGNOSIS — N179 Acute kidney failure, unspecified: Secondary | ICD-10-CM | POA: Diagnosis present

## 2016-12-19 DIAGNOSIS — Z8249 Family history of ischemic heart disease and other diseases of the circulatory system: Secondary | ICD-10-CM

## 2016-12-19 DIAGNOSIS — I959 Hypotension, unspecified: Secondary | ICD-10-CM | POA: Diagnosis present

## 2016-12-19 DIAGNOSIS — K219 Gastro-esophageal reflux disease without esophagitis: Secondary | ICD-10-CM | POA: Diagnosis present

## 2016-12-19 DIAGNOSIS — E860 Dehydration: Secondary | ICD-10-CM | POA: Diagnosis present

## 2016-12-19 DIAGNOSIS — Z6841 Body Mass Index (BMI) 40.0 and over, adult: Secondary | ICD-10-CM

## 2016-12-19 DIAGNOSIS — Z833 Family history of diabetes mellitus: Secondary | ICD-10-CM | POA: Diagnosis not present

## 2016-12-19 DIAGNOSIS — I890 Lymphedema, not elsewhere classified: Secondary | ICD-10-CM

## 2016-12-19 DIAGNOSIS — Z8673 Personal history of transient ischemic attack (TIA), and cerebral infarction without residual deficits: Secondary | ICD-10-CM | POA: Diagnosis not present

## 2016-12-19 DIAGNOSIS — Z7982 Long term (current) use of aspirin: Secondary | ICD-10-CM | POA: Diagnosis not present

## 2016-12-19 DIAGNOSIS — L8990 Pressure ulcer of unspecified site, unspecified stage: Secondary | ICD-10-CM | POA: Diagnosis present

## 2016-12-19 DIAGNOSIS — Z820 Family history of epilepsy and other diseases of the nervous system: Secondary | ICD-10-CM | POA: Diagnosis not present

## 2016-12-19 DIAGNOSIS — Z23 Encounter for immunization: Secondary | ICD-10-CM | POA: Diagnosis not present

## 2016-12-19 DIAGNOSIS — K7460 Unspecified cirrhosis of liver: Secondary | ICD-10-CM | POA: Diagnosis present

## 2016-12-19 DIAGNOSIS — I10 Essential (primary) hypertension: Secondary | ICD-10-CM | POA: Diagnosis present

## 2016-12-19 HISTORY — DX: Headache: R51

## 2016-12-19 HISTORY — DX: Hepatic failure, unspecified without coma: K72.90

## 2016-12-19 HISTORY — DX: Hepatic encephalopathy: K76.82

## 2016-12-19 HISTORY — DX: Migraine, unspecified, not intractable, without status migrainosus: G43.909

## 2016-12-19 HISTORY — DX: Obstructive sleep apnea (adult) (pediatric): G47.33

## 2016-12-19 HISTORY — DX: Headache, unspecified: R51.9

## 2016-12-19 HISTORY — DX: Dependence on other enabling machines and devices: Z99.89

## 2016-12-19 LAB — CBC WITH DIFFERENTIAL/PLATELET
BAND NEUTROPHILS: 0 %
BASOS ABS: 0 10*3/uL (ref 0.0–0.1)
Basophils Relative: 0 %
Blasts: 0 %
EOS ABS: 0.6 10*3/uL (ref 0.0–0.7)
EOS PCT: 4 %
HEMATOCRIT: 38 % — AB (ref 39.0–52.0)
Hemoglobin: 13.5 g/dL (ref 13.0–17.0)
LYMPHS ABS: 1.7 10*3/uL (ref 0.7–4.0)
LYMPHS PCT: 11 %
MCH: 32.5 pg (ref 26.0–34.0)
MCHC: 35.5 g/dL (ref 30.0–36.0)
MCV: 91.3 fL (ref 78.0–100.0)
METAMYELOCYTES PCT: 0 %
MONOS PCT: 13 %
Monocytes Absolute: 2 10*3/uL — ABNORMAL HIGH (ref 0.1–1.0)
Myelocytes: 0 %
NEUTROS ABS: 11.3 10*3/uL — AB (ref 1.7–7.7)
Neutrophils Relative %: 72 %
OTHER: 0 %
Platelets: 80 10*3/uL — ABNORMAL LOW (ref 150–400)
Promyelocytes Absolute: 0 %
RBC: 4.16 MIL/uL — ABNORMAL LOW (ref 4.22–5.81)
RDW: 16 % — AB (ref 11.5–15.5)
WBC: 15.6 10*3/uL — ABNORMAL HIGH (ref 4.0–10.5)
nRBC: 0 /100 WBC

## 2016-12-19 LAB — CREATININE, SERUM
CREATININE: 1.65 mg/dL — AB (ref 0.61–1.24)
GFR, EST AFRICAN AMERICAN: 56 mL/min — AB (ref >60)
GFR, EST NON AFRICAN AMERICAN: 49 mL/min — AB (ref >60)

## 2016-12-19 LAB — BASIC METABOLIC PANEL
Anion gap: 11 (ref 5–15)
BUN: 40 mg/dL — ABNORMAL HIGH (ref 6–20)
CALCIUM: 8.6 mg/dL — AB (ref 8.9–10.3)
CHLORIDE: 96 mmol/L — AB (ref 101–111)
CO2: 19 mmol/L — AB (ref 22–32)
CREATININE: 1.73 mg/dL — AB (ref 0.61–1.24)
GFR calc non Af Amer: 46 mL/min — ABNORMAL LOW (ref >60)
GFR, EST AFRICAN AMERICAN: 53 mL/min — AB (ref >60)
Glucose, Bld: 81 mg/dL (ref 65–99)
Potassium: 4.9 mmol/L (ref 3.5–5.1)
SODIUM: 126 mmol/L — AB (ref 135–145)

## 2016-12-19 LAB — I-STAT CG4 LACTIC ACID, ED: Lactic Acid, Venous: 2.54 mmol/L (ref 0.5–1.9)

## 2016-12-19 LAB — MRSA PCR SCREENING: MRSA BY PCR: POSITIVE — AB

## 2016-12-19 LAB — CBC
HCT: 33.5 % — ABNORMAL LOW (ref 39.0–52.0)
HEMOGLOBIN: 11.8 g/dL — AB (ref 13.0–17.0)
MCH: 32.2 pg (ref 26.0–34.0)
MCHC: 35.2 g/dL (ref 30.0–36.0)
MCV: 91.3 fL (ref 78.0–100.0)
Platelets: 76 10*3/uL — ABNORMAL LOW (ref 150–400)
RBC: 3.67 MIL/uL — AB (ref 4.22–5.81)
RDW: 15.8 % — ABNORMAL HIGH (ref 11.5–15.5)
WBC: 13.6 10*3/uL — AB (ref 4.0–10.5)

## 2016-12-19 LAB — PROCALCITONIN: Procalcitonin: 4.33 ng/mL

## 2016-12-19 MED ORDER — HEPARIN SODIUM (PORCINE) 5000 UNIT/ML IJ SOLN
5000.0000 [IU] | Freq: Three times a day (TID) | INTRAMUSCULAR | Status: DC
  Administered 2016-12-19 – 2016-12-23 (×12): 5000 [IU] via SUBCUTANEOUS
  Filled 2016-12-19 (×12): qty 1

## 2016-12-19 MED ORDER — ACETAMINOPHEN 500 MG PO TABS: 1000.0000 mg | ORAL_TABLET | Freq: Four times a day (QID) | ORAL | Status: DC | PRN

## 2016-12-19 MED ORDER — DIPHENHYDRAMINE HCL 50 MG/ML IJ SOLN
12.5000 mg | Freq: Once | INTRAMUSCULAR | Status: AC
  Administered 2016-12-19: 12.5 mg via INTRAVENOUS
  Filled 2016-12-19: qty 1

## 2016-12-19 MED ORDER — ONDANSETRON HCL 4 MG PO TABS: 4.0000 mg | ORAL_TABLET | Freq: Four times a day (QID) | ORAL | Status: DC | PRN

## 2016-12-19 MED ORDER — SODIUM CHLORIDE 0.9% FLUSH
3.0000 mL | Freq: Two times a day (BID) | INTRAVENOUS | Status: DC
  Administered 2016-12-20 – 2016-12-23 (×5): 3 mL via INTRAVENOUS

## 2016-12-19 MED ORDER — SODIUM CHLORIDE 0.9 % IV BOLUS (SEPSIS)
2000.0000 mL | Freq: Once | INTRAVENOUS | Status: AC
Start: 2016-12-19 — End: 2016-12-19
  Administered 2016-12-19: 2000 mL via INTRAVENOUS

## 2016-12-19 MED ORDER — PANTOPRAZOLE SODIUM 40 MG PO TBEC
40.0000 mg | DELAYED_RELEASE_TABLET | Freq: Every day | ORAL | Status: DC
  Administered 2016-12-20 – 2016-12-23 (×4): 40 mg via ORAL
  Filled 2016-12-19 (×4): qty 1

## 2016-12-19 MED ORDER — ACETAMINOPHEN 325 MG PO TABS
650.0000 mg | ORAL_TABLET | Freq: Four times a day (QID) | ORAL | Status: DC | PRN
  Administered 2016-12-20 – 2016-12-21 (×2): 650 mg via ORAL
  Filled 2016-12-19 (×2): qty 2

## 2016-12-19 MED ORDER — PIPERACILLIN-TAZOBACTAM 3.375 G IVPB
3.3750 g | Freq: Three times a day (TID) | INTRAVENOUS | Status: DC
  Administered 2016-12-19 – 2016-12-23 (×11): 3.375 g via INTRAVENOUS
  Filled 2016-12-19 (×12): qty 50

## 2016-12-19 MED ORDER — VANCOMYCIN HCL IN DEXTROSE 1-5 GM/200ML-% IV SOLN: 1000.0000 mg | Freq: Once | INTRAVENOUS | Status: DC

## 2016-12-19 MED ORDER — INFLUENZA VAC SPLIT QUAD 0.5 ML IM SUSY
0.5000 mL | PREFILLED_SYRINGE | INTRAMUSCULAR | Status: AC
  Administered 2016-12-20: 0.5 mL via INTRAMUSCULAR
  Filled 2016-12-19: qty 0.5

## 2016-12-19 MED ORDER — ACETAMINOPHEN 650 MG RE SUPP: 650.0000 mg | Freq: Four times a day (QID) | RECTAL | Status: DC | PRN

## 2016-12-19 MED ORDER — ADULT MULTIVITAMIN W/MINERALS CH
1.0000 | ORAL_TABLET | Freq: Every day | ORAL | Status: DC
  Administered 2016-12-20 – 2016-12-23 (×4): 1 via ORAL
  Filled 2016-12-19 (×4): qty 1

## 2016-12-19 MED ORDER — LORATADINE 10 MG PO TABS: 10.0000 mg | ORAL_TABLET | Freq: Every day | ORAL | Status: DC | PRN

## 2016-12-19 MED ORDER — CHLORHEXIDINE GLUCONATE CLOTH 2 % EX PADS
6.0000 | MEDICATED_PAD | Freq: Every day | CUTANEOUS | Status: AC
  Administered 2016-12-20 – 2016-12-23 (×4): 6 via TOPICAL

## 2016-12-19 MED ORDER — VANCOMYCIN HCL 10 G IV SOLR
2500.0000 mg | Freq: Once | INTRAVENOUS | Status: AC
  Administered 2016-12-19: 2500 mg via INTRAVENOUS
  Filled 2016-12-19: qty 2500

## 2016-12-19 MED ORDER — SODIUM CHLORIDE 0.9 % IV SOLN
INTRAVENOUS | Status: AC
  Administered 2016-12-19: 19:00:00 via INTRAVENOUS

## 2016-12-19 MED ORDER — RIFAXIMIN 550 MG PO TABS
550.0000 mg | ORAL_TABLET | Freq: Two times a day (BID) | ORAL | Status: DC
  Administered 2016-12-19 – 2016-12-23 (×8): 550 mg via ORAL
  Filled 2016-12-19 (×8): qty 1

## 2016-12-19 MED ORDER — PIPERACILLIN-TAZOBACTAM 3.375 G IVPB 30 MIN: 3.3750 g | Freq: Once | INTRAVENOUS | Status: DC

## 2016-12-19 MED ORDER — ATORVASTATIN CALCIUM 20 MG PO TABS
20.0000 mg | ORAL_TABLET | Freq: Every day | ORAL | Status: DC
  Administered 2016-12-19 – 2016-12-22 (×4): 20 mg via ORAL
  Filled 2016-12-19 (×4): qty 1

## 2016-12-19 MED ORDER — LACTULOSE 10 GM/15ML PO SOLN
30.0000 g | Freq: Three times a day (TID) | ORAL | Status: DC
  Administered 2016-12-19 – 2016-12-23 (×9): 30 g via ORAL
  Filled 2016-12-19 (×11): qty 45

## 2016-12-19 MED ORDER — ASPIRIN EC 81 MG PO TBEC
81.0000 mg | DELAYED_RELEASE_TABLET | Freq: Every day | ORAL | Status: DC
  Administered 2016-12-20 – 2016-12-23 (×4): 81 mg via ORAL
  Filled 2016-12-19 (×4): qty 1

## 2016-12-19 MED ORDER — HYDROCODONE-ACETAMINOPHEN 5-325 MG PO TABS
1.0000 | ORAL_TABLET | ORAL | Status: DC | PRN
  Administered 2016-12-21 – 2016-12-22 (×4): 2 via ORAL
  Filled 2016-12-19 (×4): qty 2

## 2016-12-19 MED ORDER — VANCOMYCIN HCL 10 G IV SOLR
1250.0000 mg | Freq: Two times a day (BID) | INTRAVENOUS | Status: DC
  Administered 2016-12-20 – 2016-12-23 (×7): 1250 mg via INTRAVENOUS
  Filled 2016-12-19 (×8): qty 1250

## 2016-12-19 MED ORDER — PNEUMOCOCCAL VAC POLYVALENT 25 MCG/0.5ML IJ INJ
0.5000 mL | INJECTION | INTRAMUSCULAR | Status: AC
  Administered 2016-12-20: 0.5 mL via INTRAMUSCULAR
  Filled 2016-12-19: qty 0.5

## 2016-12-19 MED ORDER — MUPIROCIN 2 % EX OINT
1.0000 "application " | TOPICAL_OINTMENT | Freq: Two times a day (BID) | CUTANEOUS | Status: DC
  Administered 2016-12-20 – 2016-12-23 (×7): 1 via NASAL
  Filled 2016-12-19 (×3): qty 22

## 2016-12-19 MED ORDER — ONDANSETRON HCL 4 MG/2ML IJ SOLN: 4.0000 mg | Freq: Four times a day (QID) | INTRAMUSCULAR | Status: DC | PRN

## 2016-12-19 NOTE — ED Provider Notes (Signed)
[place] DEPT Provider Note   CSN: [id number] Arrival date & time: 12/19/16  1220     History   Chief Complaint Chief Complaint  Patient presents with  . Leg Swelling    HPI Ryan Burgess is a 46 y.o. male. He presents for leg swelling and redness, body aches, fevers and chills.  HPI:  Patient has a history of chronic marked bilateral lower extremity edema. He states he's been told this is due to "my weight, and some liver cirrhosis". On Sunday, 4 days ago he started with fever shakes chills and body aches. Had a negative flu swab was started on Tamiflu at an urgent care. Yesterday his leg started becoming red and painful. He called his primary care physician and started on Keflex. Had 3 doses yesterday. Continues with worsening redness and advancing cellulitis of the right lower leg up to the mid thigh.  Past Medical History:  Diagnosis Date  . Cirrhosis, nonalcoholic ([place])   . GERD (gastroesophageal reflux disease)   . High cholesterol   . Hypertension   . Kidney stones 01/2015   UTI  . Obesity   . Sleep apnea    uses CPAP  . Snoring   . TIA (transient ischemic attack)     Patient Active Problem List   Diagnosis Date Noted  . Encephalopathy, hepatic ([place]) 11/20/2015  . Hepatic cirrhosis ([place]) 11/20/2015  . Thrombocytopenia ([place])   . Disorientation 11/19/2015  . Stroke or transient ischemic attack (TIA) diagnosed during current admission 11/19/2015  . OSA on CPAP 05/02/2015  . Hemorrhagic cystitis 01/29/2015  . Sepsis ([place]) 01/29/2015  . Flank pain 01/29/2015  . Sinus tachycardia 01/29/2015  . Lymphedema 01/29/2015  . Morbid obesity with body mass index of 50.0-59.9 in adult [facility]) 01/25/2015  . Obesity hypoventilation syndrome ([place]) 01/25/2015  . Snoring   . High cholesterol   . TIA (transient ischemic attack) 01/10/2015  . Morbid obesity ([place] 01/10/2015  . Encephalopathy acute 01/09/2015  . HTN (hypertension) 01/09/2015  . Borderline diabetic  01/09/2015  . GERD (gastroesophageal reflux disease) 01/09/2015  . Acute encephalopathy     Past Surgical History:  Procedure Laterality Date  . ESOPHAGOGASTRODUODENOSCOPY (EGD) WITH PROPOFOL N/A 07/26/2015   Procedure: ESOPHAGOGASTRODUODENOSCOPY (EGD) WITH PROPOFOL;  Surgeon: Arta Silence, MD;  Location: [facility] ENDOSCOPY;  Service: Endoscopy;  Laterality: N/A;  . MOUTH SURGERY    . TEE WITHOUT CARDIOVERSION N/A 04/03/2015   Procedure: TRANSESOPHAGEAL ECHOCARDIOGRAM (TEE);  Surgeon: Adrian Prows, MD;  Location: [facility] ENDOSCOPY;  Service: Cardiovascular;  Laterality: N/A;       Home Medications    Prior to Admission medications   Medication Sig Start Date End Date Taking? Authorizing Provider  acetaminophen (TYLENOL) 500 MG tablet Take 1,000 mg by mouth every 6 (six) hours as needed for headache.   Yes Historical Provider, MD  aspirin EC 81 MG EC tablet Take 1 tablet (81 mg total) by mouth daily. 01/10/15  Yes Ripudeep Krystal Eaton, MD  atorvastatin (LIPITOR) 20 MG tablet Take 1 tablet (20 mg total) by mouth at bedtime. 01/10/15  Yes Ripudeep Krystal Eaton, MD  blood glucose meter kit and supplies KIT Dispense based on patient and insurance preference. Use up to four times daily as directed. (FOR ICD-9 250.00, 250.01). 01/11/15  Yes Ripudeep K Rai, MD  cephALEXin (KEFLEX) 500 MG capsule Take 500 mg by mouth 3 (three) times daily.   Yes Historical Provider, MD  dexlansoprazole (DEXILANT) 60 MG capsule Take 60 mg by mouth daily.  Yes Historical Provider, MD  furosemide (LASIX) 20 MG tablet Take 1 tablet (20 mg total) by mouth daily. 01/10/15  Yes Ripudeep Krystal Eaton, MD  lactulose, encephalopathy, (CHRONULAC) 10 GM/15ML SOLN Take 45 mls by mouth 3 times a day 04/24/16  Yes Historical Provider, MD  loratadine (CLARITIN) 10 MG tablet Take 10 mg by mouth daily as needed for allergies.   Yes Historical Provider, MD  Multiple Vitamin (MULTIVITAMIN WITH MINERALS) TABS tablet Take 1 tablet by mouth daily.   Yes Historical Provider,  MD  oseltamivir (TAMIFLU) 75 MG capsule Take 75 mg by mouth 2 (two) times daily.   Yes Historical Provider, MD  phenylephrine (SUDAFED PE) 10 MG TABS tablet Take 10 mg by mouth every 4 (four) hours as needed (for congestion).    Yes Historical Provider, MD  rifaximin (XIFAXAN) 550 MG TABS tablet Take 1 tablet (550 mg total) by mouth 2 (two) times daily. 06/14/16  Yes Geronimo Boot, MD    Family History Family History  Problem Relation Age of Onset  . Leukemia Mother   . Diabetes Father   . Heart disease Maternal Grandmother   . COPD Maternal Grandmother   . Heart disease Paternal Grandmother   . Dementia Paternal Grandmother     Social History Social History  Substance Use Topics  . Smoking status: Never Smoker  . Smokeless tobacco: Never Used  . Alcohol use 0.6 - 1.2 oz/week    1 - 2 Standard drinks or equivalent per week     Comment: every three months     Allergies   Patient has no known allergies.   Review of Systems Review of Systems  Constitutional: Negative for appetite change, chills, diaphoresis, fatigue and fever.  HENT: Negative for mouth sores, sore throat and trouble swallowing.   Eyes: Negative for visual disturbance.  Respiratory: Negative for cough, chest tightness, shortness of breath and wheezing.   Cardiovascular: Negative for chest pain.  Gastrointestinal: Negative for abdominal distention, abdominal pain, diarrhea, nausea and vomiting.  Endocrine: Negative for polydipsia, polyphagia and polyuria.  Genitourinary: Negative for dysuria, frequency and hematuria.  Musculoskeletal: Negative for gait problem.  Skin: Positive for rash. Negative for color change and pallor.       Bilateral lower extremity lymphedema. Increasing erythema and pain of the right leg from the foot upward to the medial thigh.  Neurological: Negative for dizziness, syncope, light-headedness and headaches.  Hematological: Does not bruise/bleed easily.  Psychiatric/Behavioral:  Negative for behavioral problems and confusion.     Physical Exam Updated Vital Signs BP (!) 118/42   Pulse 89   Temp 98.1 F (36.7 C) (Oral)   Resp 16   Ht '6\' 5"'$  (1.956 m)   Wt (!) 520 lb (235.9 kg)   SpO2 100%   BMI 61.66 kg/m   Physical Exam  Constitutional: He is oriented to person, place, and time. He appears well-developed and well-nourished. No distress.  HENT:  Head: Normocephalic.  Eyes: Conjunctivae are normal. Pupils are equal, round, and reactive to light. No scleral icterus.  Neck: Normal range of motion. Neck supple. No thyromegaly present.  Cardiovascular: Normal rate and regular rhythm.  Exam reveals no gallop and no friction rub.   No murmur heard. Pulmonary/Chest: Effort normal and breath sounds normal. No respiratory distress. He has no wheezes. He has no rales.  Abdominal: Soft. Bowel sounds are normal. He exhibits no distension. There is no tenderness. There is no rebound.  Musculoskeletal: Normal range of motion.  Marked bilateral  symmetric lower extremity edema and lymphedema. Cellulitis to the mid thigh and involving majority of the right lower leg and foot.  Neurological: He is alert and oriented to person, place, and time.  Skin: Skin is warm and dry. No rash noted.  Psychiatric: He has a normal mood and affect. His behavior is normal.     ED Treatments / Results  Labs (all labs ordered are listed, but only abnormal results are displayed) Labs Reviewed  CBC WITH DIFFERENTIAL/PLATELET - Abnormal; Notable for the following:       Result Value   WBC 15.6 (*)    RBC 4.16 (*)    HCT 38.0 (*)    RDW 16.0 (*)    All other components within normal limits  BASIC METABOLIC PANEL - Abnormal; Notable for the following:    Sodium 126 (*)    Chloride 96 (*)    CO2 19 (*)    BUN 40 (*)    Creatinine, Ser 1.73 (*)    Calcium 8.6 (*)    GFR calc non Af Amer 46 (*)    GFR calc Af Amer 53 (*)    All other components within normal limits  I-STAT CG4  LACTIC ACID, ED - Abnormal; Notable for the following:    Lactic Acid, Venous 2.54 (*)    All other components within normal limits  CULTURE, BLOOD (ROUTINE X 2)  CULTURE, BLOOD (ROUTINE X 2)    EKG  EKG Interpretation None       Radiology No results found.  Procedures Procedures (including critical care time)  Medications Ordered in ED Medications  vancomycin (VANCOCIN) 2,500 mg in sodium chloride 0.9 % 500 mL IVPB (2,500 mg Intravenous New Bag/Given 12/19/16 1509)  sodium chloride 0.9 % bolus 2,000 mL (not administered)     Initial Impression / Assessment and Plan / ED Course  I have reviewed the triage vital signs and the nursing notes.  Pertinent labs & imaging results that were available during my care of the patient were reviewed by me and considered in my medical decision making (see chart for details).     Afebrile now. Had Motrin and Tylenol at home.  Lactate 2.54. Mild hyponatremia 126. Creatinine 1.7 leukocytosis 15.6. Cultures obtained. Patient had one low blood pressure 81/70. Is getting IV fluids, vancomycin. We'll discuss with hospitalist regarding admission.  Final Clinical Impressions(s) / ED Diagnoses   Final diagnoses:  Leg edema  Cellulitis of right lower extremity    New Prescriptions New Prescriptions   No medications on file     Tanna Furry, MD 12/19/16 1616

## 2016-12-19 NOTE — H&P (Signed)
History and Physical        Hospital Admission Note Date: 12/19/2016  Patient name: Ryan Burgess Medical record number: [id number] Date of birth: December 04, 1970 Age: 46 y.o. Gender: male  PCP: Donnie Coffin, MD   Referring physician:  Dr. Tanna Furry  Patient coming from: Home   Chief Complaint:  Redness and weeping wounds from right lower extremity  HPI: Patient is a 46 year old male with morbid obesity, chronic lymphedema and hepatic cirrhosis, hypertension, who presented to ED with worsening redness, weeping wounds from the right lower extremity for last 2 days. History was obtained from the patient and his wife. Patient reported that 4 days ago on Sunday he started with chills and body aches, nausea, no significant fevers. He went to the urgent care and was immediately started on Tamiflu. However he had a negative influenza swab. On Tuesday, 2 days ago he noticed that his right leg suddenly became red and painful. He called his primary care physician and was started on Keflex. He noticed that the redness continued to get worse and he and his leg started weeping clear fluid, with foul odor. He also reported having low-grade fevers with chills, poor appetite but no other symptoms.  ED work-up/course:  In ED temp 98.1, RR 18, pulse 87, BP 102/29 O2 sat 100% on room air Sodium 126, potassium 4.9, CO2 19, BUN 40, creatinine 1.7. His baseline creatinine 0.6 lactic acid 2.5 WBC is 15.6, hemoglobin 13.5, hematocrit 38.0  Review of Systems: Positives marked in 'bold' Constitutional: +fever, chills, diaphoresis, poor appetite and fatigue.  HEENT: Denies photophobia, eye pain, redness, hearing loss, ear pain, congestion, sore throat, rhinorrhea, sneezing, mouth sores, trouble swallowing, neck pain, neck stiffness and tinnitus.   Respiratory: Denies SOB, DOE, cough, chest tightness,   and wheezing.   Cardiovascular: Denies chest pain, palpitations and leg swelling.  Gastrointestinal: + nausea, no vomiting, abdominal pain, diarrhea, constipation, blood in stool and abdominal distention. missed lactulose today Genitourinary: Denies dysuria, urgency, frequency, hematuria, flank pain and difficulty urinating.  Musculoskeletal: Denies myalgias, back pain, joint swelling, arthralgias and gait problem.  Skin: Denies pallor, rash and wound.  Neurological: Denies dizziness, seizures, syncope, weakness, light-headedness, numbness and headaches.  Hematological: Denies adenopathy. Easy bruising, personal or family bleeding history  Psychiatric/Behavioral: Denies suicidal ideation, mood changes, confusion, nervousness, sleep disturbance and agitation  Past Medical History: Past Medical History:  Diagnosis Date  . Cirrhosis, nonalcoholic ([place])   . GERD (gastroesophageal reflux disease)   . High cholesterol   . Hypertension   . Kidney stones 01/2015   UTI  . Obesity   . Sleep apnea    uses CPAP  . Snoring   . TIA (transient ischemic attack)     Past Surgical History:  Procedure Laterality Date  . ESOPHAGOGASTRODUODENOSCOPY (EGD) WITH PROPOFOL N/A 07/26/2015   Procedure: ESOPHAGOGASTRODUODENOSCOPY (EGD) WITH PROPOFOL;  Surgeon: Arta Silence, MD;  Location: [facility] ENDOSCOPY;  Service: Endoscopy;  Laterality: N/A;  . MOUTH SURGERY    . TEE WITHOUT CARDIOVERSION N/A 04/03/2015   Procedure: TRANSESOPHAGEAL ECHOCARDIOGRAM (TEE);  Surgeon: Adrian Prows, MD;  Location: [facility] ENDOSCOPY;  Service: Cardiovascular;  Laterality: N/A;    Medications: Prior to  Admission medications   Medication Sig Start Date End Date Taking? Authorizing Provider  acetaminophen (TYLENOL) 500 MG tablet Take 1,000 mg by mouth every 6 (six) hours as needed for headache.   Yes Historical Provider, MD  aspirin EC 81 MG EC tablet Take 1 tablet (81 mg total) by mouth daily. 01/10/15  Yes Cova Knieriem Krystal Eaton, MD  atorvastatin  (LIPITOR) 20 MG tablet Take 1 tablet (20 mg total) by mouth at bedtime. 01/10/15  Yes Mylinh Cragg Krystal Eaton, MD  blood glucose meter kit and supplies KIT Dispense based on patient and insurance preference. Use up to four times daily as directed. (FOR ICD-9 250.00, 250.01). 01/11/15  Yes Esperansa Sarabia K Luellen Howson, MD  cephALEXin (KEFLEX) 500 MG capsule Take 500 mg by mouth 3 (three) times daily.   Yes Historical Provider, MD  dexlansoprazole (DEXILANT) 60 MG capsule Take 60 mg by mouth daily.   Yes Historical Provider, MD  furosemide (LASIX) 20 MG tablet Take 1 tablet (20 mg total) by mouth daily. 01/10/15  Yes Kinlie Janice Krystal Eaton, MD  lactulose, encephalopathy, (CHRONULAC) 10 GM/15ML SOLN Take 45 mls by mouth 3 times a day 04/24/16  Yes Historical Provider, MD  loratadine (CLARITIN) 10 MG tablet Take 10 mg by mouth daily as needed for allergies.   Yes Historical Provider, MD  Multiple Vitamin (MULTIVITAMIN WITH MINERALS) TABS tablet Take 1 tablet by mouth daily.   Yes Historical Provider, MD  oseltamivir (TAMIFLU) 75 MG capsule Take 75 mg by mouth 2 (two) times daily.   Yes Historical Provider, MD  phenylephrine (SUDAFED PE) 10 MG TABS tablet Take 10 mg by mouth every 4 (four) hours as needed (for congestion).    Yes Historical Provider, MD  rifaximin (XIFAXAN) 550 MG TABS tablet Take 1 tablet (550 mg total) by mouth 2 (two) times daily. 06/14/16  Yes Geronimo Boot, MD    Allergies:  No Known Allergies  Social History:  reports that he has never smoked. He has never used smokeless tobacco. He reports that he drinks about 0.6 - 1.2 oz of alcohol per week . He reports that he does not use drugs.  Family History: Family History  Problem Relation Age of Onset  . Leukemia Mother   . Diabetes Father   . Heart disease Maternal Grandmother   . COPD Maternal Grandmother   . Heart disease Paternal Grandmother   . Dementia Paternal Grandmother     Physical Exam: Blood pressure (!) 118/42, pulse 89, temperature 98.1 F (36.7  C), temperature source Oral, resp. rate 16, height '6\' 5"'$  (1.956 m), weight (!) 235.9 kg (520 lb), SpO2 100 %. General: Alert, awake, oriented x3, in no acute distress. HEENT: normocephalic, atraumatic, anicteric sclera, pink conjunctiva, pupils equal and reactive to light and accomodation, oropharynx clear Neck: supple, no masses or lymphadenopathy, no goiter, no bruits  Heart: Regular rate and rhythm, without murmurs, rubs or gallops. Lungs: Clear to auscultation bilaterally, no wheezing, rales or rhonchi. Abdomen: Soft, nontender, nondistended, positive bowel sounds, no masses. Extremities: No clubbing, cyanosis. Bilateral lower extremity chronic massive lymphedema. Significant erythema of right lower leg from foot to the knee with weeping wounds with clear drainage. Left lower extremity also with excoriation but no significant drainage   Neuro: Grossly intact, no focal neurological deficits, strength 5/5 upper and lower extremities bilaterally Psych: alert and oriented x 3, normal mood and affect Skin: Please see extremity examination, bilateral chronic lymphedema with significant cellulitis on the right lower M.D.   Reva Bores on Admission:  Basic Metabolic Panel:  Recent Labs Lab 12/19/16 1441  NA 126*  K 4.9  CL 96*  CO2 19*  GLUCOSE 81  BUN 40*  CREATININE 1.73*  CALCIUM 8.6*   Liver Function Tests: No results for input(s): AST, ALT, ALKPHOS, BILITOT, PROT, ALBUMIN in the last 168 hours. No results for input(s): LIPASE, AMYLASE in the last 168 hours. No results for input(s): AMMONIA in the last 168 hours. CBC:  Recent Labs Lab 12/19/16 1441  WBC 15.6*  NEUTROABS 11.3*  HGB 13.5  HCT 38.0*  MCV 91.3  PLT PENDING   Cardiac Enzymes: No results for input(s): CKTOTAL, CKMB, CKMBINDEX, TROPONINI in the last 168 hours. BNP: Invalid input(s): POCBNP CBG: No results for input(s): GLUCAP in the last 168 hours.  Radiological Exams on Admission:  No results found.  *I have  personally reviewed the images above*  EKG: Independently reviewed. Rate 84, normal sinus rhythm   Assessment/Plan Principal Problem:  Sepsis with Cellulitis in the setting of underlying chronic lymphedema: - Patient meets sepsis criteria due to hypotension, leukocytosis, tachycardia, lactic acidosis, acute kidney injury, source due to cellulitis - Place on IV vancomycin and Zosyn, obtain blood cultures, Procalcitonin - Wound care consult placed - Lactic acid is elevated likely due to sepsis, patient has received IV fluids  Active Problems:    Morbid obesity with body mass index of 50.0-59.9 in adult [facility]) - Patient was counseled to diet and weight control  Hepatic cirrhosis - Continue rifaximin, lactulose, currently stable no encephalopathy    AKI (acute kidney injury) ([place]) - Likely due to #1, patient takes Lasix at home - Holding Lasix due to lactic acidosis and acute kidney injury - Patient has significant peripheral edema, will need to start diuretics once creatinine function has stabilized    Hyponatremia - Likely due to #1, patient has received hydration in ED, follow BMET in a.m.  DVT prophylaxis: Heparin subcutaneous   CODE STATUS: Full CODE STATUS   Consults called: None   Family Communication: Admission, patients condition and plan of care including tests being ordered have been discussed with the patient and wife who indicates understanding and agree with the plan and Code Status  Admission status: inpatient, tele  Disposition plan: Further plan will depend as patient's clinical course evolves and further radiologic and laboratory data become available. Likely home When medically ready.  At the time of admission, it appears that the appropriate admission status for this patient is INPATIENT . This is judged to be reasonable and necessary in order to provide the required intensity of service to ensure the patient's safety given the presenting symptoms, physical  exam findings, and initial radiographic and laboratory data in the context of their chronic comorbidities.     Time Spent on Admission: 43mns     Yahel Fuston M.D. [facility] 12/19/2016, 5:07 PM Pager: [phone] If 7PM-7AM, please contact night-coverage [email or web address] Password [email or web address]

## 2016-12-19 NOTE — ED Notes (Signed)
Elevated CG-4 reported to Dr. Jeneen Rinks

## 2016-12-19 NOTE — ED Triage Notes (Signed)
Pt from home with [facility] for increased leg swelling and pain. Per wifr pt has had to have bandages changed ever few hours for about two days. Pt been treated for the flu Monday. Started on keflex yesterday for leg infection by primary care doctor.

## 2016-12-19 NOTE — ED Notes (Signed)
Mr Staiger [place] a large man with very large legs. Normally has more trouble getting around due to left leg problems but now it is his right.  Skin is cherry pink from knees down; leg wrapped in bath towels, fills with fluid from leg within an hr. Leg anterior has open wound.   Dry under pad placed.  Pt tolerated well.

## 2016-12-19 NOTE — ED Notes (Signed)
Pt has mild erythema to right hand, denies itching/pain; no edema/infiltration noted. Dr. Jeneen Rinks aware and at bedside to examine.

## 2016-12-19 NOTE — Progress Notes (Signed)
Pharmacy Antibiotic Note  Ryan Burgess is a 46 y.o. male admitted on 12/19/2016 with cellulitis.  Pharmacy has been consulted for vancomycin and zosyn dosing.  Patient was loaded with vancomycin 2.5g IV once in the ED.  Plan: Vancomycin 1250mg  IV every 12 hours.  Goal trough 10-15 mcg/mL. Zosyn 3.375g IV q8h (4 hour infusion).  Monitor culture data, renal function and clinical course VT at SS prn  Height: 6\' 5"  (195.6 cm) Weight: (!) 520 lb (235.9 kg) IBW/kg (Calculated) : 89.1  Temp (24hrs), Avg:98.1 F (36.7 C), Min:98.1 F (36.7 C), Max:98.1 F (36.7 C)   Recent Labs Lab 12/19/16 1441 12/19/16 1528  WBC 15.6*  --   CREATININE 1.73*  --   LATICACIDVEN  --  2.54*    Estimated Creatinine Clearance: 112.7 mL/min (by C-G formula based on SCr of 1.73 mg/dL (H)).    No Known Allergies   Andrey Cota. Diona Foley, PharmD, [place] Clinical Pharmacist Pager [phone] 12/19/2016 5:07 PM

## 2016-12-19 NOTE — ED Notes (Signed)
Hospitalist at bedside 

## 2016-12-20 DIAGNOSIS — Z6841 Body Mass Index (BMI) 40.0 and over, adult: Secondary | ICD-10-CM

## 2016-12-20 DIAGNOSIS — E871 Hypo-osmolality and hyponatremia: Secondary | ICD-10-CM

## 2016-12-20 DIAGNOSIS — L03115 Cellulitis of right lower limb: Secondary | ICD-10-CM

## 2016-12-20 DIAGNOSIS — N179 Acute kidney failure, unspecified: Secondary | ICD-10-CM

## 2016-12-20 LAB — BASIC METABOLIC PANEL
ANION GAP: 7 (ref 5–15)
BUN: 35 mg/dL — ABNORMAL HIGH (ref 6–20)
CALCIUM: 8 mg/dL — AB (ref 8.9–10.3)
CHLORIDE: 101 mmol/L (ref 101–111)
CO2: 23 mmol/L (ref 22–32)
CREATININE: 1.47 mg/dL — AB (ref 0.61–1.24)
GFR calc Af Amer: >60 mL/min (ref >60)
GFR calc non Af Amer: 56 mL/min — ABNORMAL LOW (ref >60)
GLUCOSE: 79 mg/dL (ref 65–99)
Potassium: 4.4 mmol/L (ref 3.5–5.1)
Sodium: 131 mmol/L — ABNORMAL LOW (ref 135–145)

## 2016-12-20 LAB — CBC
HCT: 33.5 % — ABNORMAL LOW (ref 39.0–52.0)
Hemoglobin: 11.7 g/dL — ABNORMAL LOW (ref 13.0–17.0)
MCH: 32.1 pg (ref 26.0–34.0)
MCHC: 34.9 g/dL (ref 30.0–36.0)
MCV: 91.8 fL (ref 78.0–100.0)
Platelets: 83 10*3/uL — ABNORMAL LOW (ref 150–400)
RBC: 3.65 MIL/uL — ABNORMAL LOW (ref 4.22–5.81)
RDW: 16.1 % — ABNORMAL HIGH (ref 11.5–15.5)
WBC: 13.6 10*3/uL — ABNORMAL HIGH (ref 4.0–10.5)

## 2016-12-20 NOTE — Consult Note (Signed)
Provided patient with list of lymphedema clinic resources, explained pathophysiology of dx.  Recommended patient to consider treatment in one of these clinics for lifelong management of chronic disease.    Lymphedema  Resources (updated November 2017 )  Each site requires a referral from your primary care MD [place], [place]  [phone] (Upper extremities)  [place], [place] [phone] (Lower extremities)  [place]. [place], [place] [address] [phone] [address], [id number]  [place], [place] [phone]  [facility] [place], [place] [address] [phone]  [facility]  (only treatment for lymphedema related to cancer diagnosis) [place], [place] [address] [phone]    [facility] [address] [place], [place] [address] [phone]  [facility] [address] [place],  [address] [phone]

## 2016-12-20 NOTE — Progress Notes (Addendum)
PROGRESS NOTE                                                                                                                                                                                                             Patient Demographics:    Ryan Burgess, is a 46 y.o. male, DOB - 1971/07/13, [id number]  Admit date - 12/19/2016   Admitting Physician Ripudeep Krystal Eaton, MD  Outpatient Primary MD for the patient is Donnie Coffin, MD  LOS - 1  Outpatient Specialists:none  Chief Complaint  Patient presents with  . Leg Swelling       Brief Narrative  46 year old morbidly obese male, with chronic lymphedema, hepatic cirrhosis, hypertension recently to the ED with worsening redness and weeping wound from the right lower leg for past 2 days. 4 days prior to admission he had flulike symptoms and was seen in the urgent care where he was started on Tamiflu. (However. Was negative). 2 days prior to admission he noticed right leg to be red and painful. He called his PCP who started him on Keflex. However the redness became worse and leg started weeping clear fluid with some foul order. He also had low-grade fevers and chills with poor appetite so came to the ED.  In the ED vitals were stable. Blood work showed WBC of 15.6 K, sodium of 126, creatinine 1.7 and lactic acid of 2.5. Admitted for sepsis with cellulitis of the right leg.   Subjective:   Reports feeling better today.   Assessment  & Plan :    Principal Problem:   Sepsis with cellulitis of right leg cellulitis Sepsis resolved. Empiric vancomycin and Zosyn. Blood culture so far negative. Wound care consult appreciated. Added Xeroform with Kerlix and ABDs pads for drainage. Given information on lymphedema clinic for outpatient follow-up. Supportive care.  Active Problems:   Morbid obesity with body mass index of 50.0-59.9 in adult [facility]) Needs counseling on diet and  exercise.   AKI (acute kidney injury) ([place]) Possibly due to dehydration and concomitant diuretic use. Hyponatremic on presentation. He Lasix. Renal function improving in a.m. labs.    Hyponatremia Secondary to dehydration and diuretic. Lasix held and given fluids on admission. Improving in a.m. labs.  Hepatic cirrhosis Continue lactulose and rifaximin. Stable.  Thrombocytopenia Possibly due to cirrhosis. Monitor.  Code Status : Full code  Family Communication  : Wife at bedside  Disposition Plan  : Home once improved  Barriers For Discharge : Active symptoms  Consults  :  None  Procedures  : None  DVT Prophylaxis  : Subcutaneous heparin (monitor with low platelets)  Lab Results  Component Value Date   PLT 83 (L) 12/20/2016    Antibiotics  :    Anti-infectives    Start     Dose/Rate Route Frequency Ordered Stop   12/20/16 0430  vancomycin (VANCOCIN) 1,250 mg in sodium chloride 0.9 % 250 mL IVPB     1,250 mg 166.7 mL/hr over 90 Minutes Intravenous Every 12 hours 12/19/16 1712     12/19/16 2330  piperacillin-tazobactam (ZOSYN) IVPB 3.375 g     3.375 g 12.5 mL/hr over 240 Minutes Intravenous Every 8 hours 12/19/16 1706     12/19/16 2200  rifaximin (XIFAXAN) tablet 550 mg     550 mg Oral 2 times daily 12/19/16 1820     12/19/16 1830  vancomycin (VANCOCIN) IVPB 1000 mg/200 mL premix  Status:  Discontinued     1,000 mg 200 mL/hr over 60 Minutes Intravenous  Once 12/19/16 1820 12/19/16 1838   12/19/16 1730  piperacillin-tazobactam (ZOSYN) IVPB 3.375 g     3.375 g 100 mL/hr over 30 Minutes Intravenous  Once 12/19/16 1706     12/19/16 1445  vancomycin (VANCOCIN) 2,500 mg in sodium chloride 0.9 % 500 mL IVPB     2,500 mg 250 mL/hr over 120 Minutes Intravenous  Once 12/19/16 1432 12/19/16 1709        Objective:   Vitals:   12/19/16 1746 12/19/16 2135 12/20/16 0512 12/20/16 1340  BP: (!) 134/49 (!) 115/35 (!) 131/39 (!) 158/61  Pulse: 94 83 89 85  Resp: 16  18 18 16   Temp: 98.4 F (36.9 C) 99.7 F (37.6 C) 98.3 F (36.8 C) 98.2 F (36.8 C)  TempSrc: Oral Oral Oral Oral  SpO2: 99% 98% 98% 99%  Weight:      Height:        Wt Readings from Last 3 Encounters:  12/19/16 (!) 235.9 kg (520 lb)  06/14/16 (!) 218.6 kg (482 lb)  05/01/16 (!) 223.2 kg (492 lb)     Intake/Output Summary (Last 24 hours) at 12/20/16 1407 Last data filed at 12/20/16 1253  Gross per 24 hour  Intake          1423.33 ml  Output             4150 ml  Net         -2726.67 ml     Physical Exam  Gen: Morbidly obese male not in distress HEENT:  moist mucosa, supple neck Chest: clear b/l, no added sounds CVS: N S1&S2, no murmurs, GI: soft, NT, ND,  Musculoskeletal: Bilateral lymphedema, area of erythema on the right leg extending below the knee to the foot     Data Review:    CBC  Recent Labs Lab 12/19/16 1441 12/19/16 1825 12/20/16 0429  WBC 15.6* 13.6* 13.6*  HGB 13.5 11.8* 11.7*  HCT 38.0* 33.5* 33.5*  PLT 80* 76* 83*  MCV 91.3 91.3 91.8  MCH 32.5 32.2 32.1  MCHC 35.5 35.2 34.9  RDW 16.0* 15.8* 16.1*  LYMPHSABS 1.7  --   --   MONOABS 2.0*  --   --   EOSABS 0.6  --   --   BASOSABS 0.0  --   --  Chemistries   Recent Labs Lab 12/19/16 1441 12/19/16 1825 12/20/16 0429  NA 126*  --  131*  K 4.9  --  4.4  CL 96*  --  101  CO2 19*  --  23  GLUCOSE 81  --  79  BUN 40*  --  35*  CREATININE 1.73* 1.65* 1.47*  CALCIUM 8.6*  --  8.0*   ------------------------------------------------------------------------------------------------------------------ No results for input(s): CHOL, HDL, LDLCALC, TRIG, CHOLHDL, LDLDIRECT in the last 72 hours.  Lab Results  Component Value Date   HGBA1C 5.7 (H) 11/20/2015   ------------------------------------------------------------------------------------------------------------------ No results for input(s): TSH, T4TOTAL, T3FREE, THYROIDAB in the last 72 hours.  Invalid input(s):  FREET3 ------------------------------------------------------------------------------------------------------------------ No results for input(s): VITAMINB12, FOLATE, FERRITIN, TIBC, IRON, RETICCTPCT in the last 72 hours.  Coagulation profile No results for input(s): INR, PROTIME in the last 168 hours.  No results for input(s): DDIMER in the last 72 hours.  Cardiac Enzymes No results for input(s): CKMB, TROPONINI, MYOGLOBIN in the last 168 hours.  Invalid input(s): CK ------------------------------------------------------------------------------------------------------------------    Component Value Date/Time   BNP 21.9 11/19/2015 1248    Inpatient Medications  Scheduled Meds: . aspirin EC  81 mg Oral Daily  . atorvastatin  20 mg Oral QHS  . Chlorhexidine Gluconate Cloth  6 each Topical Q0600  . heparin  5,000 Units Subcutaneous Q8H  . lactulose  30 g Oral TID  . multivitamin with minerals  1 tablet Oral Daily  . mupirocin ointment  1 application Nasal BID  . pantoprazole  40 mg Oral Daily  . piperacillin-tazobactam  3.375 g Intravenous Once  . piperacillin-tazobactam (ZOSYN)  IV  3.375 g Intravenous Q8H  . rifaximin  550 mg Oral BID  . sodium chloride flush  3 mL Intravenous Q12H  . vancomycin  1,250 mg Intravenous Q12H   Continuous Infusions: PRN Meds:.acetaminophen **OR** acetaminophen, HYDROcodone-acetaminophen, loratadine, ondansetron **OR** ondansetron (ZOFRAN) IV  Micro Results Recent Results (from the past 240 hour(s))  MRSA PCR Screening     Status: Abnormal   Collection Time: 12/19/16  8:12 PM  Result Value Ref Range Status   MRSA by PCR POSITIVE (A) NEGATIVE Final    Comment:        The GeneXpert MRSA Assay (FDA approved for NASAL specimens only), is one component of a comprehensive MRSA colonization surveillance program. It is not intended to diagnose MRSA infection nor to guide or monitor treatment for MRSA infections. RESULT CALLED TO, READ BACK BY  AND VERIFIED WITH: G PACHER,RN @2314  12/19/16 [facility]     Radiology Reports No results found.  Time Spent in minutes  25   Louellen Molder M.D on 12/20/2016 at 2:07 PM  Between 7am to 7pm - Pager - [phone]  After 7pm go to [email or web address] - password [facility]  [facility] -  Office  [phone]

## 2016-12-20 NOTE — Consult Note (Signed)
[place] Nurse wound consult note Reason for Consult: RLE weeping and ulcerations Wound type: severe lymphedema with cellulitis and partial thickness ulcerations x 2 Measurement: Distal RLE: 3cm x 1cm x 0.2cm  Proximal: 0.5cm x 0.5cm x 0.1cm  Wound bed: both, clean, ruddy, moist Drainage (amount, consistency, odor) weeping of the entire extremity related to cellulitis; serous, heavy Periwound: lymphedema skin changes, brawny edema Palpable pulses bilaterally, lymphedema effecting bilateral LE Dressing procedure/placement/frequency: Add xeroform as antibacterial and drying agent, non adherent. kerlix and ABD pads for drainage.  Patient to attempt to get into a lymphedema clinic of his choice upon DC.  Discussed POC with patient and bedside nurse.  Re consult if needed, will not follow at this time. Thanks  Mateusz Neilan [employer], RN,CWOCN, CNS [phone])

## 2016-12-21 LAB — CBC
HEMATOCRIT: 33.8 % — AB (ref 39.0–52.0)
HEMOGLOBIN: 11.9 g/dL — AB (ref 13.0–17.0)
MCH: 32.3 pg (ref 26.0–34.0)
MCHC: 35.2 g/dL (ref 30.0–36.0)
MCV: 91.8 fL (ref 78.0–100.0)
Platelets: 89 10*3/uL — ABNORMAL LOW (ref 150–400)
RBC: 3.68 MIL/uL — ABNORMAL LOW (ref 4.22–5.81)
RDW: 16.5 % — AB (ref 11.5–15.5)
WBC: 13.4 10*3/uL — ABNORMAL HIGH (ref 4.0–10.5)

## 2016-12-21 LAB — URINE CULTURE: Culture: NO GROWTH

## 2016-12-21 LAB — BASIC METABOLIC PANEL
ANION GAP: 3 — AB (ref 5–15)
BUN: 25 mg/dL — AB (ref 6–20)
CHLORIDE: 108 mmol/L (ref 101–111)
CO2: 24 mmol/L (ref 22–32)
Calcium: 8.1 mg/dL — ABNORMAL LOW (ref 8.9–10.3)
Creatinine, Ser: 1.31 mg/dL — ABNORMAL HIGH (ref 0.61–1.24)
GFR calc Af Amer: >60 mL/min (ref >60)
GFR calc non Af Amer: >60 mL/min (ref >60)
GLUCOSE: 80 mg/dL (ref 65–99)
POTASSIUM: 4.1 mmol/L (ref 3.5–5.1)
Sodium: 135 mmol/L (ref 135–145)

## 2016-12-21 NOTE — Progress Notes (Addendum)
PROGRESS NOTE  Ryan Burgess [id number] DOB: February 23, 1971 DOA: 12/19/2016 PCP: Donnie Coffin, MD   LOS: 2 days   Brief Narrative: 46 year old morbidly obese male, with chronic lymphedema, hepatic cirrhosis, hypertension recently to the ED with worsening redness and weeping wound from the right lower leg for past 2 days. 4 days prior to admission he had flulike symptoms and was seen in the urgent care where he was started on Tamiflu. (However. Was negative). 2 days prior to admission he noticed right leg to be red and painful. He called his PCP who started him on Keflex. However the redness became worse and leg started weeping clear fluid with some foul order. He also had low-grade fevers and chills with poor appetite so came to the ED. In the ED vitals were stable. Blood work showed WBC of 15.6 K, sodium of 126, creatinine 1.7 and lactic acid of 2.5. Admitted for sepsis with cellulitis of the right leg and started on Vancomycin and Zosyn  Assessment & Plan: Principal Problem:   Cellulitis Active Problems:   HTN (hypertension)   Morbid obesity with body mass index of 50.0-59.9 in adult (HCC)   Sepsis (HCC)   AKI (acute kidney injury) ([place])   Hyponatremia   Pressure injury of skin   Sepsis with cellulitis of right leg cellulitis - Sepsis physiology resolved, he is afebrile. Started on empiric Vancomycin and Zosyn, continue for now. Significantly improvement today. Wound care consulted.  Added Xeroform with Kerlix and ABDs pads for drainage. Given information on lymphedema clinic for outpatient follow-up. Cultures have remained negative.  Morbid obesity with body mass index of 50.0-59.9 in adult [facility]) - Needs counseling on diet and exercise, he lost about 70 lbs few years ago but gained them back.   AKI (acute kidney injury) ([place]) - Possibly due to dehydration and concomitant diuretic use. Improved with saline, now off of IVF, encourage po intake.   Hyponatremia - Secondary to  dehydration and diuretic, now normalized.   Hepatic cirrhosis with history of encephalopathy - Continue lactulose and rifaximin. Stable. No evidence of HE at this time.   Thrombocytopenia - due to cirrhosis, no bleeding, monitor   DVT prophylaxis: heparin  Code Status: Full code Family Communication: d/w wife bedside Disposition Plan: home when ready, likely in 2 days can be converted to po antibiotics  Consultants:   None  Procedures:   None   Antimicrobials:  Vancomycin 1/18 >>  Zosyn 1/18 >>   Subjective: - no chest pain, shortness of breath, no abdominal pain, nausea or vomiting. Has right foot pain  Objective: Vitals:   12/20/16 0512 12/20/16 1340 12/20/16 2114 12/21/16 0554  BP: (!) 131/39 (!) 158/61 (!) 132/39 (!) 138/40  Pulse: 89 85 87 88  Resp: 18 16 19 18   Temp: 98.3 F (36.8 C) 98.2 F (36.8 C) 99.3 F (37.4 C) 98.3 F (36.8 C)  TempSrc: Oral Oral Oral Oral  SpO2: 98% 99% 96% 96%  Weight:      Height:        Intake/Output Summary (Last 24 hours) at 12/21/16 0952 Last data filed at 12/21/16 0659  Gross per 24 hour  Intake              300 ml  Output             2200 ml  Net            -1900 ml   Filed Weights   12/19/16 1226  Weight: (!) 235.9  kg (520 lb)    Examination: Constitutional: NAD Vitals:   12/20/16 0512 12/20/16 1340 12/20/16 2114 12/21/16 0554  BP: (!) 131/39 (!) 158/61 (!) 132/39 (!) 138/40  Pulse: 89 85 87 88  Resp: 18 16 19 18   Temp: 98.3 F (36.8 C) 98.2 F (36.8 C) 99.3 F (37.4 C) 98.3 F (36.8 C)  TempSrc: Oral Oral Oral Oral  SpO2: 98% 99% 96% 96%  Weight:      Height:       ENMT: Mucous membranes are moist. No oropharyngeal exudates Neck: normal, supple, no masses, no thyromegaly Respiratory: clear to auscultation bilaterally, no wheezing, no crackles.  Cardiovascular: Regular rate and rhythm, no murmurs / rubs / gallops.  Abdomen: no tenderness. Bowel sounds positive.  Skin: chronic bilateral LE  changes with chronic lymphedema, RLE cellulitis extending from below the knee and involving right foot. Improving. Several areas of superficial ulceration anterior and posterior, weeping clear fluid. Neurologic: CN 2-12 grossly intact. Strength 5/5 in all 4.    Data Reviewed: I have personally reviewed following labs and imaging studies  CBC:  Recent Labs Lab 12/19/16 1441 12/19/16 1825 12/20/16 0429 12/21/16 0400  WBC 15.6* 13.6* 13.6* 13.4*  NEUTROABS 11.3*  --   --   --   HGB 13.5 11.8* 11.7* 11.9*  HCT 38.0* 33.5* 33.5* 33.8*  MCV 91.3 91.3 91.8 91.8  PLT 80* 76* 83* 89*   Basic Metabolic Panel:  Recent Labs Lab 12/19/16 1441 12/19/16 1825 12/20/16 0429 12/21/16 0400  NA 126*  --  131* 135  K 4.9  --  4.4 4.1  CL 96*  --  101 108  CO2 19*  --  23 24  GLUCOSE 81  --  79 80  BUN 40*  --  35* 25*  CREATININE 1.73* 1.65* 1.47* 1.31*  CALCIUM 8.6*  --  8.0* 8.1*   GFR: Estimated Creatinine Clearance: 148.9 mL/min (by C-G formula based on SCr of 1.31 mg/dL (H)). Liver Function Tests: No results for input(s): AST, ALT, ALKPHOS, BILITOT, PROT, ALBUMIN in the last 168 hours. No results for input(s): LIPASE, AMYLASE in the last 168 hours. No results for input(s): AMMONIA in the last 168 hours. Coagulation Profile: No results for input(s): INR, PROTIME in the last 168 hours. Cardiac Enzymes: No results for input(s): CKTOTAL, CKMB, CKMBINDEX, TROPONINI in the last 168 hours. BNP (last 3 results) No results for input(s): PROBNP in the last 8760 hours. HbA1C: No results for input(s): HGBA1C in the last 72 hours. CBG: No results for input(s): GLUCAP in the last 168 hours. Lipid Profile: No results for input(s): CHOL, HDL, LDLCALC, TRIG, CHOLHDL, LDLDIRECT in the last 72 hours. Thyroid Function Tests: No results for input(s): TSH, T4TOTAL, FREET4, T3FREE, THYROIDAB in the last 72 hours. Anemia Panel: No results for input(s): VITAMINB12, FOLATE, FERRITIN, TIBC, IRON,  RETICCTPCT in the last 72 hours. Urine analysis:    Component Value Date/Time   COLORURINE YELLOW 11/19/2015 2130   APPEARANCEUR CLEAR 11/19/2015 2130   LABSPEC 1.018 11/19/2015 2130   PHURINE 7.0 11/19/2015 2130   GLUCOSEU NEGATIVE 11/19/2015 2130   HGBUR NEGATIVE 11/19/2015 2130   [place] NEGATIVE 11/19/2015 2130   [place] NEGATIVE 11/19/2015 2130   PROTEINUR NEGATIVE 11/19/2015 2130   UROBILINOGEN 1.0 01/28/2015 1933   NITRITE NEGATIVE 11/19/2015 2130   LEUKOCYTESUR NEGATIVE 11/19/2015 2130   Sepsis Labs: Invalid input(s): PROCALCITONIN, LACTICIDVEN  Recent Results (from the past 240 hour(s))  Culture, blood (Routine X 2) w Reflex to ID  Panel     Status: None (Preliminary result)   Collection Time: 12/19/16  2:48 PM  Result Value Ref Range Status   Specimen Description BLOOD RIGHT ANTECUBITAL  Final   Special Requests BOTTLES DRAWN AEROBIC AND ANAEROBIC 5CC  Final   Culture NO GROWTH < 24 HOURS  Final   Report Status PENDING  Incomplete  Culture, blood (Routine X 2) w Reflex to ID Panel     Status: None (Preliminary result)   Collection Time: 12/19/16  3:15 PM  Result Value Ref Range Status   Specimen Description BLOOD RIGHT HAND  Final   Special Requests BOTTLES DRAWN AEROBIC ONLY 5CC PT ON VANC  Final   Culture NO GROWTH < 24 HOURS  Final   Report Status PENDING  Incomplete  MRSA PCR Screening     Status: Abnormal   Collection Time: 12/19/16  8:12 PM  Result Value Ref Range Status   MRSA by PCR POSITIVE (A) NEGATIVE Final    Comment:        The GeneXpert MRSA Assay (FDA approved for NASAL specimens only), is one component of a comprehensive MRSA colonization surveillance program. It is not intended to diagnose MRSA infection nor to guide or monitor treatment for MRSA infections. RESULT CALLED TO, READ BACK BY AND VERIFIED WITH: G PACHER,RN @2314  12/19/16 MKELLY,MLT   Urine culture     Status: None   Collection Time: 12/19/16 10:36 PM  Result Value Ref  Range Status   Specimen Description URINE, RANDOM  Final   Special Requests NONE  Final   Culture NO GROWTH  Final   Report Status 12/21/2016 FINAL  Final      Radiology Studies: No results found.   Scheduled Meds: . aspirin EC  81 mg Oral Daily  . atorvastatin  20 mg Oral QHS  . Chlorhexidine Gluconate Cloth  6 each Topical Q0600  . heparin  5,000 Units Subcutaneous Q8H  . lactulose  30 g Oral TID  . multivitamin with minerals  1 tablet Oral Daily  . mupirocin ointment  1 application Nasal BID  . pantoprazole  40 mg Oral Daily  . piperacillin-tazobactam  3.375 g Intravenous Once  . piperacillin-tazobactam (ZOSYN)  IV  3.375 g Intravenous Q8H  . rifaximin  550 mg Oral BID  . sodium chloride flush  3 mL Intravenous Q12H  . vancomycin  1,250 mg Intravenous Q12H   Continuous Infusions:  Marzetta Board, MD, PhD [facility] Pager [phone]  If 7PM-7AM, please contact night-coverage [email or web address] Password [email or web address] 12/21/2016, 9:52 AM

## 2016-12-21 NOTE — Evaluation (Signed)
Physical Therapy Evaluation Patient Details Name: Ryan Burgess MRN: [id number] DOB: [birth year] Today's Date: 12/21/2016   History of Present Illness  Patient is a 46 yo male admitted 12/19/16 with reddness RLE.  Patient with sepsis, cellulitis RLE, dehydration, AKI.     PMH:  morbid obesity, OSA on CPAP, chronic lymphedema BLE's, HTN, HLD, TIA  Clinical Impression  Patient presents with problems listed below.  Will benefit from acute PT to maximize functional mobility prior to discharge home with family.  Patient will need to function at Mod I level or family will need to provide additional assist.   Recommend HHPT at d/c for continued therapy.    Follow Up Recommendations Home health PT;Supervision for mobility/OOB    Equipment Recommendations  Rolling walker with 5" wheels;3in1 (PT) (Bariatric equipment)    Recommendations for Other Services       Precautions / Restrictions Precautions Precautions: Fall Precaution Comments: Painful feet Restrictions Weight Bearing Restrictions: No      Mobility  Bed Mobility Overal bed mobility: Needs Assistance Bed Mobility: Supine to Sit;Sit to Supine     Supine to sit: Mod assist;HOB elevated Sit to supine: Min assist;HOB elevated   General bed mobility comments: Assist to raise trunk to sitting position.  Once upright, patient with good balance.   Required min assist to bring LE's onto bed.  Transfers Overall transfer level: Needs assistance Equipment used: Rolling walker (2 wheeled) Transfers: Sit to/from Stand Sit to Stand: Min guard;From elevated surface         General transfer comment: Verbal cues for hand placement.  Patient able to move to standing from bed with min guard assist for safety.  Ambulation/Gait Ambulation/Gait assistance: Min guard Ambulation Distance (Feet): 3 Feet (Took 20 steps in place with each LE. Sidestepped 3' to [facility].) Assistive device: Rolling walker (2 wheeled)       General Gait Details:  Patient slightly lightheaded from medication.  Remained close to bed in stance.  Moved well stepping in place and sidestepping.  Assist for safety only.  Stairs            Wheelchair Mobility    Modified Rankin (Stroke Patients Only)       Balance Overall balance assessment: Needs assistance Sitting-balance support: No upper extremity supported;Feet supported Sitting balance-Leahy Scale: Good     Standing balance support: No upper extremity supported Standing balance-Leahy Scale: Fair                               Pertinent Vitals/Pain Pain Assessment: Faces Faces Pain Scale: Hurts a little bit Pain Location: Feet and back Pain Descriptors / Indicators: Aching;Sore Pain Intervention(s): Premedicated before session;Monitored during session;Repositioned (Mobility better with premedication)    Home Living Family/patient expects to be discharged to:: Private residence Living Arrangements: Spouse/significant other;Children Available Help at Discharge: Family;Available PRN/intermittently (Wife works) Type of Home: House Home Access: Stairs to enter Entrance Stairs-Rails: Psychiatric nurse of Steps: 4 and 9 to get to front door (16 steps at back door) Home Layout: One level Home Equipment: None      Prior Function Level of Independence: Independent         Comments: Drives; Works      Journalist, newspaper        Extremity/Trunk Assessment   Upper Extremity Assessment Upper Extremity Assessment: Overall WFL for tasks assessed    Lower Extremity Assessment Lower Extremity Assessment: RLE deficits/detail;LLE deficits/detail RLE  Deficits / Details: Strength grossly 3/5; significant edema; reddness foot (lower leg bandaged) RLE Coordination: decreased gross motor LLE Deficits / Details: Strength grossly 3+/5 to 4/5; significant edema; lower leg bandaged LLE Coordination: decreased gross motor       Communication   Communication: No  difficulties  Cognition Arousal/Alertness: Awake/alert Behavior During Therapy: WFL for tasks assessed/performed Overall Cognitive Status: Within Functional Limits for tasks assessed                      General Comments      Exercises     Assessment/Plan    PT Assessment Patient needs continued PT services  PT Problem List Decreased strength;Decreased activity tolerance;Decreased balance;Decreased mobility;Decreased coordination;Decreased knowledge of use of DME;Obesity;Pain;Decreased skin integrity          PT Treatment Interventions DME instruction;Gait training;Stair training;Functional mobility training;Therapeutic activities;Therapeutic exercise;Patient/family education    PT Goals (Current goals can be found in the Care Plan section)  Acute Rehab PT Goals Patient Stated Goal: To return home independently PT Goal Formulation: With patient/family Time For Goal Achievement: 12/28/16 Potential to Achieve Goals: Good    Frequency Min 3X/week   Barriers to discharge Inaccessible home environment;Decreased caregiver support 13 steps to enter front door.  Wife works so will be home alone during day.    Co-evaluation               End of Session   Activity Tolerance: Patient tolerated treatment well (Minimal pain in feet during session) Patient left: in bed;with call bell/phone within reach;with family/visitor present Nurse Communication: Mobility status (Pain meds helped patient tolerate mobility)         Time: 1440-1502 PT Time Calculation (min) (ACUTE ONLY): 22 min   Charges:   PT Evaluation $PT Eval Moderate Complexity: 1 Procedure     PT G Codes:        Despina Pole 01-15-17, 3:23 PM Carita Pian. Sanjuana Kava, [place] Pager [phone]

## 2016-12-22 LAB — CBC
HCT: 33.6 % — ABNORMAL LOW (ref 39.0–52.0)
Hemoglobin: 11.6 g/dL — ABNORMAL LOW (ref 13.0–17.0)
MCH: 32 pg (ref 26.0–34.0)
MCHC: 34.5 g/dL (ref 30.0–36.0)
MCV: 92.8 fL (ref 78.0–100.0)
Platelets: 101 K/uL — ABNORMAL LOW (ref 150–400)
RBC: 3.62 MIL/uL — ABNORMAL LOW (ref 4.22–5.81)
RDW: 16.9 % — ABNORMAL HIGH (ref 11.5–15.5)
WBC: 11.7 K/uL — ABNORMAL HIGH (ref 4.0–10.5)

## 2016-12-22 LAB — BASIC METABOLIC PANEL
Anion gap: 3 — ABNORMAL LOW (ref 5–15)
BUN: 17 mg/dL (ref 6–20)
CALCIUM: 7.9 mg/dL — AB (ref 8.9–10.3)
CO2: 26 mmol/L (ref 22–32)
Chloride: 105 mmol/L (ref 101–111)
Creatinine, Ser: 1.28 mg/dL — ABNORMAL HIGH (ref 0.61–1.24)
GFR calc Af Amer: >60 mL/min (ref >60)
GLUCOSE: 93 mg/dL (ref 65–99)
POTASSIUM: 4.2 mmol/L (ref 3.5–5.1)
Sodium: 134 mmol/L — ABNORMAL LOW (ref 135–145)

## 2016-12-22 LAB — VANCOMYCIN, TROUGH: Vancomycin Tr: 30 ug/mL (ref 15–20)

## 2016-12-22 NOTE — Progress Notes (Signed)
Patient already on his CPAP and doing well. No issues at this time.

## 2016-12-22 NOTE — Progress Notes (Signed)
Pharmacy Antibiotic Note  Ryan Burgess is a 46 y.o. male admitted on 12/19/2016 with cellulitis.  Pharmacy has been consulted for vancomycin and zosyn dosing. Vancomycin drawn this afternoon is not accurate, unfortunately the level was drawn after the dose was given. Will continue current vancomycin regimen and recheck the level.   Plan: Vancomycin 1250mg  IV every 12 hours.  Goal trough 10-15 mcg/mL. Zosyn 3.375g IV q8h (4 hour infusion).  Monitor culture data, renal function and clinical course VT tomorrow  Height: 6\' 5"  (195.6 cm) Weight: (!) 520 lb (235.9 kg) IBW/kg (Calculated) : 89.1  Temp (24hrs), Avg:98.3 F (36.8 C), Min:98 F (36.7 C), Max:98.6 F (37 C)   Recent Labs Lab 12/19/16 1441 12/19/16 1528 12/19/16 1825 12/20/16 0429 12/21/16 0400 12/22/16 0433 12/22/16 1618  WBC 15.6*  --  13.6* 13.6* 13.4* 11.7*  --   CREATININE 1.73*  --  1.65* 1.47* 1.31* 1.28*  --   LATICACIDVEN  --  2.54*  --   --   --   --   --   VANCOTROUGH  --   --   --   --   --   --  30*    Estimated Creatinine Clearance: 152.4 mL/min (by C-G formula based on SCr of 1.28 mg/dL (H)).    No Known Allergies  Antimicrobials this admission:  1/18 Zosyn >>  1/18 vancomycin  >>   Microbiology results:  1/18 BCx: px 1/18 UCx:  ngF 1/18 MRSA PCR: negative     Hughes Better, PharmD, BCPS Clinical Pharmacist 12/22/2016 5:28 PM

## 2016-12-23 DIAGNOSIS — I890 Lymphedema, not elsewhere classified: Secondary | ICD-10-CM

## 2016-12-23 DIAGNOSIS — A419 Sepsis, unspecified organism: Principal | ICD-10-CM

## 2016-12-23 DIAGNOSIS — I10 Essential (primary) hypertension: Secondary | ICD-10-CM

## 2016-12-23 DIAGNOSIS — L03115 Cellulitis of right lower limb: Secondary | ICD-10-CM | POA: Diagnosis present

## 2016-12-23 LAB — BASIC METABOLIC PANEL
Anion gap: 6 (ref 5–15)
BUN: 12 mg/dL (ref 6–20)
CALCIUM: 7.9 mg/dL — AB (ref 8.9–10.3)
CO2: 25 mmol/L (ref 22–32)
CREATININE: 1.16 mg/dL (ref 0.61–1.24)
Chloride: 103 mmol/L (ref 101–111)
GFR calc non Af Amer: >60 mL/min (ref >60)
Glucose, Bld: 89 mg/dL (ref 65–99)
Potassium: 4.2 mmol/L (ref 3.5–5.1)
SODIUM: 134 mmol/L — AB (ref 135–145)

## 2016-12-23 LAB — VANCOMYCIN, TROUGH: VANCOMYCIN TR: 10 ug/mL — AB (ref 15–20)

## 2016-12-23 MED ORDER — SACCHAROMYCES BOULARDII 250 MG PO CAPS: 250.0000 mg | ORAL_CAPSULE | Freq: Two times a day (BID) | ORAL | 0 refills | Status: DC

## 2016-12-23 MED ORDER — CLINDAMYCIN HCL 150 MG PO CAPS: 450.0000 mg | ORAL_CAPSULE | Freq: Three times a day (TID) | ORAL | 0 refills | Status: AC

## 2016-12-23 MED ORDER — AMOXICILLIN-POT CLAVULANATE 875-125 MG PO TABS: 1.0000 | ORAL_TABLET | Freq: Two times a day (BID) | ORAL | 0 refills | Status: DC

## 2016-12-23 NOTE — Progress Notes (Signed)
Late Entry: Patient placed on CPAP and tolerating well. No issues at this time.

## 2016-12-23 NOTE — Progress Notes (Signed)
Physical Therapy Treatment Patient Details Name: Ryan Burgess MRN: [id number] DOB: Apr 16, 1971 Today's Date: 12/23/2016    History of Present Illness Patient is a 46 yo male admitted 12/19/16 with reddness RLE.  Patient with sepsis, cellulitis RLE, dehydration, AKI.     PMH:  morbid obesity, OSA on CPAP, chronic lymphedema BLE's, HTN, HLD, TIA    PT Comments    Upon arrival, patient expressed overwhelming concern for going home today without wife here to assist with d/c process. Pt able to climb 11 stairs with exceptional speed and no c/o of SOB or pain, but stated to SPTA that he feels dizzy after stair climbing; reported comments to nurse. Patient reported that he feels more confident to go home now after participating with PT. Patient planning on being discharged today to private resident with RW and HHPT.    Follow Up Recommendations  Home health PT;Supervision for mobility/OOB     Equipment Recommendations  Rolling walker with 5" wheels (Bariatric walker)    Recommendations for Other Services       Precautions / Restrictions Precautions Precautions: Fall Restrictions Weight Bearing Restrictions: No    Mobility  Bed Mobility               General bed mobility comments: pt in chair upon arrival   Transfers Overall transfer level: Needs assistance Equipment used: Rolling walker (2 wheeled)   Sit to Stand: Min guard         General transfer comment: min guard for safety due to pt reporting today is the first time he's sitting up in 4 days.   Ambulation/Gait Ambulation/Gait assistance: Min guard Ambulation Distance (Feet): 120 Feet (+ 120 ft) Assistive device: Rolling walker (2 wheeled) Gait Pattern/deviations: Trunk flexed;Decreased stride length     General Gait Details: pt required v/c to keep RW on the ground, to stay within boundaries of RW and for upright posture. Min guard for safety 2nd person for chair follow. One rest break taken due to fatigue  after stairs. Vitals normal but pt c/o dizziness after climbing stairs- reported to nurse of dizziness   Stairs Stairs: Yes   Stair Management: One rail Left Number of Stairs: 11 General stair comments: pt performed exceptionally well with no cuing required.   Wheelchair Mobility    Modified Rankin (Stroke Patients Only)       Balance Overall balance assessment: Needs assistance Sitting-balance support: Feet supported Sitting balance-Leahy Scale: Good     Standing balance support: Bilateral upper extremity supported Standing balance-Leahy Scale: Fair Standing balance comment: pt able to stand independently without RW but required RW for dynamic due to him mentioning legs feel weak and wobbly.                    Cognition Arousal/Alertness: Awake/alert Behavior During Therapy: WFL for tasks assessed/performed Overall Cognitive Status: Within Functional Limits for tasks assessed                 General Comments: pt appears depressed and overwhelmed by his discharge today. Became tearful prior to tx; after talking with PT, patient became more accepting to do therapy.     Exercises      General Comments        Pertinent Vitals/Pain Pain Assessment: No/denies pain Faces Pain Scale: Hurts a little bit Pain Location: low back Pain Descriptors / Indicators: Aching;Sore Pain Intervention(s): Monitored during session;Repositioned    Home Living  Prior Function            PT Goals (current goals can now be found in the care plan section) Acute Rehab PT Goals Patient Stated Goal: To return home today with wife PT Goal Formulation: With patient Potential to Achieve Goals: Good Progress towards PT goals: Progressing toward goals    Frequency    Min 3X/week      PT Plan Current plan remains appropriate    Co-evaluation             End of Session Equipment Utilized During Treatment: Gait belt Activity  Tolerance: Patient tolerated treatment well Patient left: in chair;with call bell/phone within reach     Time: 1336-1414 PT Time Calculation (min) (ACUTE ONLY): 38 min  Charges:  $Gait Training: 23-37 mins $Therapeutic Activity: 8-22 mins                    G Codes:      [facility] 01/18/2017, 3:13 PM [place], [place] Pager [phone]

## 2016-12-23 NOTE — Care Management Note (Signed)
Case Management Note  Patient Details  Name: Ryan Burgess MRN: [id number] Date of Birth: Dec 11, 1970  Subjective/Objective:           Admitted with cellulitis         Action/Plan:  Plan is to d/c to home with home health services( RN,PT)  Expected Discharge Date:   12/23/2016            Expected Discharge Plan:  [place]  In-House Referral:     Discharge planning Services  CM Consult  Post Acute Care Choice:    Choice offered to:  Patient  DME Arranged:   rolling walker (heavy duty) will be delivered to pt home DME Agency:   [place] referral made to [place] @ [phone]  [place]:  PT, RN [facility] Agency:  [facility] [place] made referral to [place] @ [phone].  Status of Service:  Completed, signed off  If discussed at [place] of Stay Meetings, dates discussed:    Additional Comments:  Sharin Mons, RN 12/23/2016, 10:47 AM

## 2016-12-23 NOTE — Discharge Summary (Signed)
Physician Discharge Summary  Ryan Burgess STM:[id number] DOB: 1971/11/01 DOA: 12/19/2016  PCP: Donnie Coffin, MD  Admit date: 12/19/2016 Discharge date: 12/23/2016  Admitted From: Home Disposition: Home  Recommendations for Outpatient Follow-up:  1. Follow up with PCP in 1-2 weeks. Patient Provided information on outpatient lymphedema clinic. 2. Patient will complete a ten-day course of antibiotics on 1/29.   Home Health: RN Equipment/Devices: None  Discharge Condition: Fair CODE STATUS: Full code Diet recommendation: Heart Healthy     Discharge Diagnoses:   Principal problem   Sepsis ([place])   Active Problems:   Cellulitis of leg, right   HTN (hypertension)   Morbid obesity with body mass index of 50.0-59.9 in adult [facility])   Lymphedema   AKI (acute kidney injury) ([place])   Hyponatremia   Pressure injury of skin   Hepatic cirrhosis   Flulike symptoms  Brief narrative/history of present illness Please refer to admission H&P for details, in brief,46 year old morbidly obese male, with chronic lymphedema, hepatic cirrhosis, hypertension recently to the ED with worsening redness and weeping wound from the right lower leg for past 2 days. 4 days prior to admission he had flulike symptoms and was seen in the urgent care where he was started on Tamiflu. (However. Was negative). 2 days prior to admission he noticed right leg to be red and painful. He called his PCP who started him on Keflex. However the redness became worse and leg started weeping clear fluid with some foul order. He also had low-grade fevers and chills with poor appetite so came to the ED. In the ED vitals were stable. Blood work showed WBC of 15.6 K, sodium of 126, creatinine 1.7 and lactic acid of 2.5. Admitted for sepsis with cellulitis of the right leg and started on Vancomycin and Zosyn.  Hospital course  Principal problem Sepsis with cellulitis of right leg cellulitis  - Sepsis resolved. Placed on empiric  Vancomycin and ZosynWound care consulted.  Added Xeroform with Kerlix and ABDs pads for drainage. Given information on lymphedema clinic for outpatient follow-up. Cultures have remained negative. Cellulitis significantly improved on exam today. Will discharge him on oral clindamycin to complete total ten-day course of antibiotics.  Added flosrostar. Instructed on keeping leg elevated and monitor for any worsened redness or discharge from the leg, fevers or chills.   Morbid obesity with body mass index of 50.0-59.9 in adult [facility]) - Needs counseling on diet and exercise, he lost about 70 lbs few years ago but gained them back.   AKI (acute kidney injury) ([place])  - Possibly due to dehydration and concomitant diuretic use. Program with gentle hydration. Encouraged by mouth intake.  Hyponatremia  - Secondary to dehydration and diuretic, now normalized.   Hepatic cirrhosis with history of encephalopathy  - Continue lactulose and rifaximin. Stable. Has chronic thrombocytopenia.   Flulike symptoms Was prescribed Tamiflu as outpatient which he has completed.  Family Communication:  none at bedside  Disposition Plan: home   Consultants:   None  Procedures:   None   Discharge Instructions   Allergies as of 12/23/2016   No Known Allergies     Medication List    STOP taking these medications   cephALEXin 500 MG capsule Commonly known as:  KEFLEX   oseltamivir 75 MG capsule Commonly known as:  TAMIFLU     TAKE these medications   acetaminophen 500 MG tablet Commonly known as:  TYLENOL Take 1,000 mg by mouth every 6 (six) hours as needed for headache.  aspirin 81 MG EC tablet Take 1 tablet (81 mg total) by mouth daily.   atorvastatin 20 MG tablet Commonly known as:  LIPITOR Take 1 tablet (20 mg total) by mouth at bedtime.   blood glucose meter kit and supplies Kit Dispense based on patient and insurance preference. Use up to four times daily as directed. (FOR  ICD-9 250.00, 250.01).   clindamycin 150 MG capsule Commonly known as:  CLEOCIN Take 3 capsules (450 mg total) by mouth 3 (three) times daily.   DEXILANT 60 MG capsule Generic drug:  dexlansoprazole Take 60 mg by mouth daily.   furosemide 20 MG tablet Commonly known as:  LASIX Take 1 tablet (20 mg total) by mouth daily.   lactulose (encephalopathy) 10 GM/15ML Soln Commonly known as:  CHRONULAC Take 45 mls by mouth 3 times a day   loratadine 10 MG tablet Commonly known as:  CLARITIN Take 10 mg by mouth daily as needed for allergies.   multivitamin with minerals Tabs tablet Take 1 tablet by mouth daily.   phenylephrine 10 MG Tabs tablet Commonly known as:  SUDAFED PE Take 10 mg by mouth every 4 (four) hours as needed (for congestion).   rifaximin 550 MG Tabs tablet Commonly known as:  XIFAXAN Take 1 tablet (550 mg total) by mouth 2 (two) times daily.   saccharomyces boulardii 250 MG capsule Commonly known as:  FLORASTOR Take 1 capsule (250 mg total) by mouth 2 (two) times daily.      Follow-up Information    [facility] Follow up.   Specialty:  [place] Why:  home health PT arranged, office will call and set up home visits Contact information: [place] [address] [phone]        Donnie Coffin, MD. Schedule an appointment as soon as possible for a visit in 1 week(s).   Specialty:  Family Medicine Contact information: [address] [place] [address] [phone]          No Known Allergies     Subjective: Feels better. No overnight issues.  Discharge Exam: Vitals:   12/22/16 2107 12/23/16 0426  BP: (!) 158/60 (!) 134/43  Pulse: 75 (!) 58  Resp: 18 18  Temp: 98.8 F (37.1 C) 98.4 F (36.9 C)   Vitals:   12/22/16 0517 12/22/16 1632 12/22/16 2107 12/23/16 0426  BP: (!) 128/46 (!) 134/45 (!) 158/60 (!) 134/43  Pulse: 82 74 75 (!) 58  Resp: '18 17 18 18  '$ Temp: 98 F (36.7 C)  98.6 F (37 C) 98.8 F (37.1 C) 98.4 F (36.9 C)  TempSrc: Oral     SpO2: 94% 98% 98% 96%  Weight:      Height:        General:Middle aged morbidly obese male not in distress   HEENT: Moist mucosa, supple neck Chest: Clear bilaterally, no added sounds CVS: Normal S1 and S2, no murmurs GI: Soft, nondistended, nontender, bowel sounds present Musculoskeletal: Chronic lymphedema bilaterally, erythema over the right leg much improved     The results of significant diagnostics from this hospitalization (including imaging, microbiology, ancillary and laboratory) are listed below for reference.     Microbiology: Recent Results (from the past 240 hour(s))  Culture, blood (Routine X 2) w Reflex to ID Panel     Status: None (Preliminary result)   Collection Time: 12/19/16  2:48 PM  Result Value Ref Range Status   Specimen Description BLOOD RIGHT  ANTECUBITAL  Final   Special Requests BOTTLES DRAWN AEROBIC AND ANAEROBIC 5CC  Final   Culture NO GROWTH 3 DAYS  Final   Report Status PENDING  Incomplete  Culture, blood (Routine X 2) w Reflex to ID Panel     Status: None (Preliminary result)   Collection Time: 12/19/16  3:15 PM  Result Value Ref Range Status   Specimen Description BLOOD RIGHT HAND  Final   Special Requests BOTTLES DRAWN AEROBIC ONLY 5CC PT ON VANC  Final   Culture NO GROWTH 3 DAYS  Final   Report Status PENDING  Incomplete  MRSA PCR Screening     Status: Abnormal   Collection Time: 12/19/16  8:12 PM  Result Value Ref Range Status   MRSA by PCR POSITIVE (A) NEGATIVE Final    Comment:        The GeneXpert MRSA Assay (FDA approved for NASAL specimens only), is one component of a comprehensive MRSA colonization surveillance program. It is not intended to diagnose MRSA infection nor to guide or monitor treatment for MRSA infections. RESULT CALLED TO, READ BACK BY AND VERIFIED WITH: G PACHER,RN '@2314'$  12/19/16 MKELLY,MLT   Urine culture     Status: None   Collection  Time: 12/19/16 10:36 PM  Result Value Ref Range Status   Specimen Description URINE, RANDOM  Final   Special Requests NONE  Final   Culture NO GROWTH  Final   Report Status 12/21/2016 FINAL  Final     Labs: BNP (last 3 results) No results for input(s): BNP in the last 8760 hours. Basic Metabolic Panel:  Recent Labs Lab 12/19/16 1441 12/19/16 1825 12/20/16 0429 12/21/16 0400 12/22/16 0433 12/23/16 0452  NA 126*  --  131* 135 134* 134*  K 4.9  --  4.4 4.1 4.2 4.2  CL 96*  --  101 108 105 103  CO2 19*  --  '23 24 26 25  '$ GLUCOSE 81  --  79 80 93 89  BUN 40*  --  35* 25* 17 12  CREATININE 1.73* 1.65* 1.47* 1.31* 1.28* 1.16  CALCIUM 8.6*  --  8.0* 8.1* 7.9* 7.9*   Liver Function Tests: No results for input(s): AST, ALT, ALKPHOS, BILITOT, PROT, ALBUMIN in the last 168 hours. No results for input(s): LIPASE, AMYLASE in the last 168 hours. No results for input(s): AMMONIA in the last 168 hours. CBC:  Recent Labs Lab 12/19/16 1441 12/19/16 1825 12/20/16 0429 12/21/16 0400 12/22/16 0433  WBC 15.6* 13.6* 13.6* 13.4* 11.7*  NEUTROABS 11.3*  --   --   --   --   HGB 13.5 11.8* 11.7* 11.9* 11.6*  HCT 38.0* 33.5* 33.5* 33.8* 33.6*  MCV 91.3 91.3 91.8 91.8 92.8  PLT 80* 76* 83* 89* 101*   Cardiac Enzymes: No results for input(s): CKTOTAL, CKMB, CKMBINDEX, TROPONINI in the last 168 hours. BNP: Invalid input(s): POCBNP CBG: No results for input(s): GLUCAP in the last 168 hours. D-Dimer No results for input(s): DDIMER in the last 72 hours. Hgb A1c No results for input(s): HGBA1C in the last 72 hours. Lipid Profile No results for input(s): CHOL, HDL, LDLCALC, TRIG, CHOLHDL, LDLDIRECT in the last 72 hours. Thyroid function studies No results for input(s): TSH, T4TOTAL, T3FREE, THYROIDAB in the last 72 hours.  Invalid input(s): FREET3 Anemia work up No results for input(s): VITAMINB12, FOLATE, FERRITIN, TIBC, IRON, RETICCTPCT in the last 72 hours. Urinalysis    Component  Value Date/Time   COLORURINE YELLOW 11/19/2015 2130  APPEARANCEUR CLEAR 11/19/2015 2130   LABSPEC 1.018 11/19/2015 2130   PHURINE 7.0 11/19/2015 2130   GLUCOSEU NEGATIVE 11/19/2015 2130   HGBUR NEGATIVE 11/19/2015 2130   BILIRUBINUR NEGATIVE 11/19/2015 2130   [place] 11/19/2015 2130   PROTEINUR NEGATIVE 11/19/2015 2130   UROBILINOGEN 1.0 01/28/2015 1933   NITRITE NEGATIVE 11/19/2015 2130   LEUKOCYTESUR NEGATIVE 11/19/2015 2130   Sepsis Labs Invalid input(s): PROCALCITONIN,  WBC,  LACTICIDVEN Microbiology Recent Results (from the past 240 hour(s))  Culture, blood (Routine X 2) w Reflex to ID Panel     Status: None (Preliminary result)   Collection Time: 12/19/16  2:48 PM  Result Value Ref Range Status   Specimen Description BLOOD RIGHT ANTECUBITAL  Final   Special Requests BOTTLES DRAWN AEROBIC AND ANAEROBIC 5CC  Final   Culture NO GROWTH 3 DAYS  Final   Report Status PENDING  Incomplete  Culture, blood (Routine X 2) w Reflex to ID Panel     Status: None (Preliminary result)   Collection Time: 12/19/16  3:15 PM  Result Value Ref Range Status   Specimen Description BLOOD RIGHT HAND  Final   Special Requests BOTTLES DRAWN AEROBIC ONLY 5CC PT ON VANC  Final   Culture NO GROWTH 3 DAYS  Final   Report Status PENDING  Incomplete  MRSA PCR Screening     Status: Abnormal   Collection Time: 12/19/16  8:12 PM  Result Value Ref Range Status   MRSA by PCR POSITIVE (A) NEGATIVE Final    Comment:        The GeneXpert MRSA Assay (FDA approved for NASAL specimens only), is one component of a comprehensive MRSA colonization surveillance program. It is not intended to diagnose MRSA infection nor to guide or monitor treatment for MRSA infections. RESULT CALLED TO, READ BACK BY AND VERIFIED WITH: G PACHER,RN '@2314'$  12/19/16 MKELLY,MLT   Urine culture     Status: None   Collection Time: 12/19/16 10:36 PM  Result Value Ref Range Status   Specimen Description URINE, RANDOM   Final   Special Requests NONE  Final   Culture NO GROWTH  Final   Report Status 12/21/2016 FINAL  Final     Time coordinating discharge: Over 30 minutes  SIGNED:   Louellen Molder, MD  [facility] 12/23/2016, 11:59 AM Pager   If 7PM-7AM, please contact night-coverage [email or web address] Password [email or web address]

## 2016-12-23 NOTE — Progress Notes (Signed)
[name] to be D/C'd home per MD order.  Discussed with the patient and all questions fully answered.  VSS, Skin clean, dry and intact without evidence of skin break down, no evidence of skin tears noted. All wound dressing done and intact upon discharged IV catheter discontinued intact. Site without signs and symptoms of complications. Dressing and pressure applied.  An After Visit Summary was printed and given to the patient. Patient received prescription.  D/c education completed with patient/family including follow up instructions, medication list, d/c activities limitations if indicated, with other d/c instructions as indicated by MD - patient able to verbalize understanding, all questions fully answered.   Patient instructed to return to ED, call 911, or call MD for any changes in condition.   Patient escorted via [place], and D/C home via private auto.  Dorris Carnes 12/23/2016 3:46 PM

## 2016-12-24 LAB — CULTURE, BLOOD (ROUTINE X 2)
CULTURE: NO GROWTH
Culture: NO GROWTH

## 2016-12-27 LAB — CBC AND DIFFERENTIAL
HCT: 38 % — AB (ref 41–53)
HEMOGLOBIN: 13 g/dL — AB (ref 13.5–17.5)
Platelets: 113 10*3/uL — AB (ref 150–399)
WBC: 4.9 10*3/mL

## 2016-12-27 LAB — BASIC METABOLIC PANEL
BUN: 11 mg/dL (ref 4–21)
Creatinine: 0.9 mg/dL (ref <=1.3)
Glucose: 104 mg/dL
POTASSIUM: 4.3 mmol/L (ref 3.4–5.3)
SODIUM: 135 mmol/L — AB (ref 137–147)

## 2017-01-02 ENCOUNTER — Encounter: Payer: BLUE CROSS/BLUE SHIELD | Attending: Surgery | Admitting: Surgery

## 2017-01-02 DIAGNOSIS — Z6841 Body Mass Index (BMI) 40.0 and over, adult: Secondary | ICD-10-CM | POA: Diagnosis not present

## 2017-01-02 DIAGNOSIS — R7303 Prediabetes: Secondary | ICD-10-CM | POA: Insufficient documentation

## 2017-01-02 DIAGNOSIS — G4730 Sleep apnea, unspecified: Secondary | ICD-10-CM | POA: Diagnosis not present

## 2017-01-02 DIAGNOSIS — L03115 Cellulitis of right lower limb: Secondary | ICD-10-CM | POA: Diagnosis not present

## 2017-01-02 DIAGNOSIS — Z7982 Long term (current) use of aspirin: Secondary | ICD-10-CM | POA: Diagnosis not present

## 2017-01-02 DIAGNOSIS — I10 Essential (primary) hypertension: Secondary | ICD-10-CM | POA: Diagnosis not present

## 2017-01-02 DIAGNOSIS — K7460 Unspecified cirrhosis of liver: Secondary | ICD-10-CM | POA: Diagnosis not present

## 2017-01-02 DIAGNOSIS — Z79899 Other long term (current) drug therapy: Secondary | ICD-10-CM | POA: Insufficient documentation

## 2017-01-02 DIAGNOSIS — E785 Hyperlipidemia, unspecified: Secondary | ICD-10-CM | POA: Diagnosis not present

## 2017-01-02 DIAGNOSIS — F329 Major depressive disorder, single episode, unspecified: Secondary | ICD-10-CM | POA: Insufficient documentation

## 2017-01-02 DIAGNOSIS — I890 Lymphedema, not elsewhere classified: Secondary | ICD-10-CM | POA: Diagnosis present

## 2017-01-03 NOTE — Progress Notes (Signed)
NEHORAI, REHBEIN ([id number]) Visit Report for 01/02/2017 Chief Complaint Document Details Patient Name: Ryan Burgess, Ryan Burgess. Date of Service: 01/02/2017 8:00 AM Medical Record Number: [id number] Patient Account Number: [id number] Date of Birth/Sex: 07/27/1971 (46 y.o. Male) Treating RN: Montey Hora Primary Care Provider: Donavan Burnet Other Clinician: Referring Provider: Donavan Burnet Treating Provider/Extender: Frann Rider in Treatment: 0 Information Obtained from: Patient Chief Complaint Patient presents to the wound care center for a consult due non healing wound to the right lower extremity with extensive swelling over the last several years Electronic Signature(s) Signed: 01/02/2017 9:16:11 AM By: Christin Fudge MD, FACS Entered By: Christin Fudge on 01/02/2017 09:16:11 Ryan Burgess ([id number]) -------------------------------------------------------------------------------- HPI Details Patient Name: Ryan Burgess Date of Service: 01/02/2017 8:00 AM Medical Record Number: [id number] Patient Account Number: [id number] Date of Birth/Sex: July 01, 1971 (46 y.o. Male) Treating RN: Montey Hora Primary Care Provider: Donavan Burnet Other Clinician: Referring Provider: Donavan Burnet Treating Provider/Extender: Frann Rider in Treatment: 0 History of Present Illness Location: bilateral lower extremity swelling with weeping and drainage from the right lower extremity Quality: Patient reports experiencing heaviness to affected area(s). Severity: Patient states wound are getting worse. Duration: Patient has had the wound for > 3 months prior to seeking treatment at the wound center Timing: Pain in wound is Intermittent (comes and goes Context: The wound appeared gradually over time Modifying Factors: Other treatment(s) tried include:recently admitted to the hospital with cellulitis and sepsis Associated Signs and Symptoms: Patient reports having increase  discharge. HPI Description: 46 year old gentleman who was recently hospitalized for cellulitis of the right calf, lymphedema, morbid obesity and depression has been on clindamycin, and was referred to [place] for management of cellulitis and lymphedema. Past medical history significant for diabetes mellitus, TIA, edema, morbid obesity, sleep apnea, lymphedema and depression. He is not a smoker. he was recently admitted to the hospital between January 18 and 12/23/2016 for sepsis due to cellulitis of the right leg with hypertension, morbid obesity, lymphedema, acute kidney injury, hepatic cirrhosis and flulike symptoms. during his hospital course he was given vancomycin and Zosyn. he was discharged home on oral clindamycin. He is homebound at present and has not been working. Electronic Signature(s) Signed: 01/02/2017 9:17:24 AM By: Christin Fudge MD, FACS Previous Signature: 01/02/2017 8:50:16 AM Version By: Christin Fudge MD, FACS Previous Signature: 01/02/2017 8:49:42 AM Version By: Christin Fudge MD, FACS Previous Signature: 01/02/2017 8:43:56 AM Version By: Christin Fudge MD, FACS Entered By: Christin Fudge on 01/02/2017 09:17:24 Ryan Burgess ([id number]) -------------------------------------------------------------------------------- Physical Exam Details Patient Name: Ryan Burgess, Ryan Burgess B. Date of Service: 01/02/2017 8:00 AM Medical Record Number: [id number] Patient Account Number: [id number] Date of Birth/Sex: Apr 15, 1971 (46 y.o. Male) Treating RN: Montey Hora Primary Care Provider: Donavan Burnet Other Clinician: Referring Provider: Donavan Burnet Treating Provider/Extender: Frann Rider in Treatment: 0 Constitutional . Pulse regular. Respirations normal and unlabored. Afebrile. . Eyes Nonicteric. Reactive to light. Ears, Nose, Mouth, and Throat Lips, teeth, and gums WNL.Marland Kitchen Moist mucosa without lesions. Neck supple and nontender. No palpable supraclavicular or cervical  adenopathy. Normal sized without goiter. Respiratory WNL. No retractions.. Breath sounds WNL, No rubs, rales, rhonchi, or wheeze.. Cardiovascular Heart rhythm and rate regular, no murmur or gallop.. Pedal Pulses WNL. ABI on the right was 1.15. he has stage III lymphedema bilaterally. Chest Breasts symmetical and no nipple discharge.. Breast tissue WNL, no masses, lumps, or tenderness.. Gastrointestinal (GI) Abdomen without masses or tenderness.. No liver or spleen enlargement or tenderness.. Lymphatic No adneopathy. No adenopathy. No  adenopathy. Musculoskeletal Adexa without tenderness or enlargement.. Digits and nails w/o clubbing, cyanosis, infection, petechiae, ischemia, or inflammatory conditions.. Integumentary (Hair, Skin) No suspicious lesions. No crepitus or fluctuance. No peri-wound warmth or erythema. No masses.Marland Kitchen Psychiatric Judgement and insight Intact.. No evidence of depression, anxiety, or agitation.. Notes the patient has bilateral lymphedema stage III with extensive ulceration and weeping of the right lower anterior leg with resolving cellulitis. No sharp debridement was required today. Electronic Signature(s) Signed: 01/02/2017 9:19:44 AM By: Christin Fudge MD, FACS Entered By: Christin Fudge on 01/02/2017 09:19:43 Ryan Burgess ([id number]) Ryan Burgess ([id number]) -------------------------------------------------------------------------------- Physician Orders Details Patient Name: Ryan Burgess Date of Service: 01/02/2017 8:00 AM Medical Record Number: [id number] Patient Account Number: [id number] Date of Birth/Sex: 01/19/1971 (46 y.o. Male) Treating RN: Montey Hora Primary Care Provider: Donavan Burnet Other Clinician: Referring Provider: Donavan Burnet Treating Provider/Extender: Frann Rider in Treatment: 0 Verbal / Phone Orders: Yes Clinician: Montey Hora Read Back and Verified: Yes Diagnosis Coding ICD-10 Coding Code  Description E11.622 Type 2 diabetes mellitus with other skin ulcer I89.0 Lymphedema, not elsewhere classified L03.115 Cellulitis of right lower limb E66.01 Morbid (severe) obesity due to excess calories Wound Cleansing Wound #1 Right,Midline,Anterior Lower Leg o Clean wound with Normal Saline. o May shower with protection. Primary Wound Dressing Wound #1 Right,Midline,Anterior Lower Leg o Aquacel Ag Secondary Dressing Wound #1 Right,Midline,Anterior Lower Leg o ABD pad o XtraSorb Dressing Change Frequency Wound #1 Right,Midline,Anterior Lower Leg o Three times weekly Follow-up Appointments Wound #1 Right,Midline,Anterior Lower Leg o Return Appointment in 1 week. Edema Control Wound #1 Right,Midline,Anterior Lower Leg o 4-Layer Compression System - Right Lower Extremity Additional Orders / Instructions [place] ([id number]) Wound #1 Right,Midline,Anterior Lower Leg o Increase protein intake. o Other: - please add vitamin a, vitamin c and zinc supplements to your diet Home Health Wound #1 [place] Nurse may visit PRN to address patientos wound care needs. o FACE TO FACE ENCOUNTER: MEDICARE and MEDICAID PATIENTS: I certify that this patient is under my care and that I had a face-to-face encounter that meets the physician face-to-face encounter requirements with this patient on this date. The encounter with the patient was in whole or in part for the following MEDICAL CONDITION: (primary reason for [place]) MEDICAL NECESSITY: I certify, that based on my findings, NURSING services are a medically necessary home health service. HOME BOUND STATUS: I certify that my clinical findings support that this patient is homebound (i.e., Due to illness or injury, pt requires aid of supportive devices such as crutches, cane, wheelchairs, walkers, the use of special transportation or  the assistance of another person to leave their place of residence. There is a normal inability to leave the home and doing so requires considerable and taxing effort. Other absences are for medical reasons / religious services and are infrequent or of short duration when for other reasons). o If current dressing causes regression in wound condition, may D/C ordered dressing product/s and apply Normal Saline Moist Dressing daily until next [place] / Other MD appointment. [place] of regression in wound condition at [phone]. o Please direct any NON-WOUND related issues/requests for orders to patient's Primary Care Physician Services and Therapies o Lymphedema Clinic Electronic Signature(s) Signed: 01/02/2017 3:11:09 PM By: Christin Fudge MD, FACS Signed: 01/02/2017 4:13:16 PM By: Montey Hora Entered By: Montey Hora on 01/02/2017 09:08:11 Ryan Burgess ([id number]) -------------------------------------------------------------------------------- Problem List Details  Patient Name: MATTHEU, POINTER. Date of Service: 01/02/2017 8:00 AM Medical Record Number: [id number] Patient Account Number: [id number] Date of Birth/Sex: October 03, 1971 (46 y.o. Male) Treating RN: Montey Hora Primary Care Provider: Donavan Burnet Other Clinician: Referring Provider: Donavan Burnet Treating Provider/Extender: Frann Rider in Treatment: 0 Active Problems ICD-10 Encounter Code Description Active Date Diagnosis I89.0 Lymphedema, not elsewhere classified 01/02/2017 Yes L03.115 Cellulitis of right lower limb 01/02/2017 Yes E66.01 Morbid (severe) obesity due to excess calories 01/02/2017 Yes R73.03 Prediabetes 01/02/2017 Yes Inactive Problems Resolved Problems Electronic Signature(s) Signed: 01/02/2017 9:15:35 AM By: Christin Fudge MD, FACS Previous Signature: 01/02/2017 8:54:30 AM Version By: Christin Fudge MD, FACS Entered By: Christin Fudge on 01/02/2017  09:15:35 Ryan Burgess ([id number]) -------------------------------------------------------------------------------- Progress Note Details Patient Name: Ryan Burgess Date of Service: 01/02/2017 8:00 AM Medical Record Number: [id number] Patient Account Number: [id number] Date of Birth/Sex: [birth year] (46 y.o. Male) Treating RN: Montey Hora Primary Care Provider: Donavan Burnet Other Clinician: Referring Provider: Donavan Burnet Treating Provider/Extender: Frann Rider in Treatment: 0 Subjective Chief Complaint Information obtained from Patient Patient presents to the wound care center for a consult due non healing wound to the right lower extremity with extensive swelling over the last several years History of Present Illness (HPI) The following HPI elements were documented for the patient's wound: Location: bilateral lower extremity swelling with weeping and drainage from the right lower extremity Quality: Patient reports experiencing heaviness to affected area(s). Severity: Patient states wound are getting worse. Duration: Patient has had the wound for > 3 months prior to seeking treatment at the wound center Timing: Pain in wound is Intermittent (comes and goes Context: The wound appeared gradually over time Modifying Factors: Other treatment(s) tried include:recently admitted to the hospital with cellulitis and sepsis Associated Signs and Symptoms: Patient reports having increase discharge. 46 year old gentleman who was recently hospitalized for cellulitis of the right calf, lymphedema, morbid obesity and depression has been on clindamycin, and was referred to [place] for management of cellulitis and lymphedema. Past medical history significant for diabetes mellitus, TIA, edema, morbid obesity, sleep apnea, lymphedema and depression. He is not a smoker. he was recently admitted to the hospital between January 18 and 12/23/2016 for sepsis due to cellulitis of the  right leg with hypertension, morbid obesity, lymphedema, acute kidney injury, hepatic cirrhosis and flulike symptoms. during his hospital course he was given vancomycin and Zosyn. he was discharged home on oral clindamycin. He is homebound at present and has not been working. Wound History Patient presents with 2 open wounds that have been present for approximately 2 weeks. Patient has been treating wounds in the following manner: xeroform, rolled gauze. Laboratory tests have been performed in the last month. Patient reportedly has not tested positive for an antibiotic resistant organism. Patient reportedly has not tested positive for osteomyelitis. Patient reportedly has not had testing performed to evaluate circulation in the legs. Patient experiences the following problems associated with their wounds: infection, swelling. Patient History Information obtained from Patient. Ryan Burgess, Ryan Burgess ([id number]) Allergies No Known Allergies Social History Never smoker, Marital Status - Married, Alcohol Use - Never, Drug Use - No History, Caffeine Use - Moderate. Medical History Hematologic/Lymphatic Patient has history of Lymphedema Respiratory Patient has history of Sleep Apnea - CPAP Gastrointestinal Patient has history of Cirrhosis Endocrine Patient has history of Type II Diabetes Oncologic Denies history of Received Chemotherapy, Received Radiation Blood sugar is tested. Medical And Surgical History Notes Cardiovascular hyperlipidemia Review of Systems (ROS) Constitutional Symptoms (General  Health) The patient has no complaints or symptoms. Eyes The patient has no complaints or symptoms. Ear/Nose/Mouth/Throat The patient has no complaints or symptoms. Hematologic/Lymphatic The patient has no complaints or symptoms. Cardiovascular Complains or has symptoms of LE edema. Gastrointestinal The patient has no complaints or symptoms. Genitourinary The patient has no complaints  or symptoms. Immunological The patient has no complaints or symptoms. Integumentary (Skin) The patient has no complaints or symptoms. Musculoskeletal The patient has no complaints or symptoms. Neurologic The patient has no complaints or symptoms. Oncologic Ryan Burgess, Ryan Burgess ([id number]) The patient has no complaints or symptoms. Psychiatric The patient has no complaints or symptoms. Medications acetaminophen 500 mg tablet oral tablet oral Florastor 250 mg capsule oral capsule oral - ends 01/03/17 atorvastatin 20 mg tablet oral tablet oral phenylephrine 10 mg tablet oral tablet oral lactulose 10 gram/15 mL (15 mL) oral solution oral solution oral furosemide 20 mg tablet oral tablet oral Multiple Vitamins tablet oral tablet oral aspirin 81 mg tablet,delayed release oral tablet,delayed release (DR/EC) oral Dexilant 60 mg capsule, delayed release oral capsule,biphase delayed releas oral Xifaxan 550 mg tablet oral tablet oral Objective Constitutional Pulse regular. Respirations normal and unlabored. Afebrile. Vitals Time Taken: 8:17 AM, Height: 77 in, Source: Measured, Weight: 495 lbs, Source: Measured, BMI: 58.7, Temperature: 98.2 F, Pulse: 70 bpm, Respiratory Rate: 16 breaths/min, Blood Pressure: 153/60 mmHg. Eyes Nonicteric. Reactive to light. Ears, Nose, Mouth, and Throat Lips, teeth, and gums WNL.Marland Kitchen Moist mucosa without lesions. Neck supple and nontender. No palpable supraclavicular or cervical adenopathy. Normal sized without goiter. Respiratory WNL. No retractions.. Breath sounds WNL, No rubs, rales, rhonchi, or wheeze.. Cardiovascular Heart rhythm and rate regular, no murmur or gallop.. Pedal Pulses WNL. ABI on the right was 1.15. he has stage III lymphedema bilaterally. EMILO, LOIACONO ([id number]) Chest Breasts symmetical and no nipple discharge.. Breast tissue WNL, no masses, lumps, or tenderness.. Gastrointestinal (GI) Abdomen without masses or tenderness.. No  liver or spleen enlargement or tenderness.. Lymphatic No adneopathy. No adenopathy. No adenopathy. Musculoskeletal Adexa without tenderness or enlargement.. Digits and nails w/o clubbing, cyanosis, infection, petechiae, ischemia, or inflammatory conditions.Marland Kitchen Psychiatric Judgement and insight Intact.. No evidence of depression, anxiety, or agitation.. General Notes: the patient has bilateral lymphedema stage III with extensive ulceration and weeping of the right lower anterior leg with resolving cellulitis. No sharp debridement was required today. Integumentary (Hair, Skin) No suspicious lesions. No crepitus or fluctuance. No peri-wound warmth or erythema. No masses.. Wound #1 status is Open. Original cause of wound was Gradually Appeared. The wound is located on the Right,Midline,Anterior Lower Leg. The wound measures 18cm length x 10.5cm width x 0.1cm depth; 148.44cm^2 area and 14.844cm^3 volume. The wound is limited to skin breakdown. There is no tunneling noted. There is a large amount of serous drainage noted. The wound margin is flat and intact. There is large (67-100%) granulation within the wound bed. There is a small (1-33%) amount of necrotic tissue within the wound bed including Adherent Slough. The periwound skin appearance exhibited: Scarring, Rubor. The periwound skin appearance did not exhibit: Callus, Crepitus, Excoriation, Induration, Rash, Dry/Scaly, Maceration, Atrophie Blanche, Cyanosis, Ecchymosis, Hemosiderin Staining, Mottled, Pallor, Erythema. Periwound temperature was noted as No Abnormality. The periwound has tenderness on palpation. Assessment Active Problems ICD-10 I89.0 - Lymphedema, not elsewhere classified L03.115 - Cellulitis of right lower limb E66.01 - Morbid (severe) obesity due to excess calories R73.03 - Prediabetes Ryan Burgess, Ryan B. ([id number]) This morbidly obese gentleman who is prediabetic, has stage III lymphedema bilaterally  which has  recently been treated for cellulitis both inpatient and outpatient. After review today I have recommended: 1. Silver alginate and a 4-layer Profore to be changed 3 times a week. 2. elevation and exercise 3. Appropriate control of his weight and to consider bariatric surgery 4. Regular visits to the wound center 5. After his acute situation has been sorted out he will be referred to the lymphedema clinic at [facility] The patient has had all questions answered and will be compliant Plan Wound Cleansing: Wound #1 Right,Midline,Anterior Lower Leg: Clean wound with Normal Saline. May shower with protection. Primary Wound Dressing: Wound #1 Right,Midline,Anterior Lower Leg: Aquacel Ag Secondary Dressing: Wound #1 Right,Midline,Anterior Lower Leg: ABD pad XtraSorb Dressing Change Frequency: Wound #1 Right,Midline,Anterior Lower Leg: Three times weekly Follow-up Appointments: Wound #1 Right,Midline,Anterior Lower Leg: Return Appointment in 1 week. Edema Control: Wound #1 Right,Midline,Anterior Lower Leg: 4-Layer Compression System - Right Lower Extremity Additional Orders / Instructions: Wound #1 Right,Midline,Anterior Lower Leg: Increase protein intake. Other: - please add vitamin a, vitamin c and zinc supplements to your diet Home Health: Wound #1 Right,Midline,Anterior Lower Leg: [place] Nurse may visit PRN to address patient s wound care needs. FACE TO FACE ENCOUNTER: MEDICARE and MEDICAID PATIENTS: I certify that this patient is under my care and that I had a face-to-face encounter that meets the physician face-to-face encounter requirements with this patient on this date. The encounter with the patient was in whole or in part for the following MEDICAL CONDITION: (primary reason for [place]) MEDICAL NECESSITY: I certify, that based on my findings, NURSING services are a medically necessary home health service.  HOME BOUND STATUS: I certify that my clinical findings support that this patient is homebound (i.e., Due to illness or injury, pt requires aid of supportive devices such as crutches, cane, wheelchairs, walkers, the use Ryan Burgess, Ryan B. ([id number]) of special transportation or the assistance of another person to leave their place of residence. There is a normal inability to leave the home and doing so requires considerable and taxing effort. Other absences are for medical reasons / religious services and are infrequent or of short duration when for other reasons). If current dressing causes regression in wound condition, may D/C ordered dressing product/s and apply Normal Saline Moist Dressing daily until next [place] / Other MD appointment. [place] of regression in wound condition at [phone]. Please direct any NON-WOUND related issues/requests for orders to patient's Primary Care Physician Services and Therapies ordered were: Lymphedema Clinic This morbidly obese gentleman who is prediabetic, has stage III lymphedema bilaterally which has recently been treated for cellulitis both inpatient and outpatient. After review today I have recommended: 1. Silver alginate and a 4-layer Profore to be changed 3 times a week. 2. elevation and exercise 3. Appropriate control of his weight and to consider bariatric surgery 4. Regular visits to the wound center 5. After his acute situation has been sorted out he will be referred to the lymphedema clinic at [facility] The patient has had all questions answered and will be compliant Electronic Signature(s) Signed: 01/02/2017 9:21:52 AM By: Christin Fudge MD, FACS Entered By: Christin Fudge on 01/02/2017 09:21:52 Ryan Burgess ([id number]) -------------------------------------------------------------------------------- ROS/PFSH Details Patient Name: Ryan Burgess. Date of Service: 01/02/2017  8:00 AM Medical Record Number: [id number] Patient Account Number: [id number] Date of Birth/Sex: 1971-03-15 (46 y.o. Male) Treating RN: Montey Hora Primary Care Provider: Donavan Burnet Other Clinician:  Referring Provider: Donavan Burnet Treating Provider/Extender: Frann Rider in Treatment: 0 Information Obtained From Patient Wound History Do you currently have one or more open woundso Yes How many open wounds do you currently haveo 2 Approximately how long have you had your woundso 2 weeks How have you been treating your wound(s) until nowo xeroform, rolled gauze Has your wound(s) ever healed and then re-openedo No Have you had any lab work done in the past montho Yes Who ordered the lab work doneo hospital Have you tested positive for an antibiotic resistant organism (MRSA, VRE)o No Have you tested positive for osteomyelitis (bone infection)o No Have you had any tests for circulation on your legso No Have you had other problems associated with your woundso Infection, Swelling Cardiovascular Complaints and Symptoms: Positive for: LE edema Medical History: Past Medical History Notes: hyperlipidemia Constitutional Symptoms (General Health) Complaints and Symptoms: No Complaints or Symptoms Eyes Complaints and Symptoms: No Complaints or Symptoms Ear/Nose/Mouth/Throat Complaints and Symptoms: No Complaints or Symptoms Hematologic/Lymphatic Ryan Burgess, Ryan Burgess ([id number]) Complaints and Symptoms: No Complaints or Symptoms Medical History: Positive for: Lymphedema Respiratory Medical History: Positive for: Sleep Apnea - CPAP Gastrointestinal Complaints and Symptoms: No Complaints or Symptoms Medical History: Positive for: Cirrhosis Endocrine Medical History: Positive for: Type II Diabetes Blood sugar tested every day: Yes Tested : QD Genitourinary Complaints and Symptoms: No Complaints or Symptoms Immunological Complaints and Symptoms: No  Complaints or Symptoms Integumentary (Skin) Complaints and Symptoms: No Complaints or Symptoms Musculoskeletal Complaints and Symptoms: No Complaints or Symptoms Neurologic Complaints and Symptoms: No Complaints or Symptoms Oncologic Ryan Burgess, Ryan B. ([id number]) Complaints and Symptoms: No Complaints or Symptoms Medical History: Negative for: Received Chemotherapy; Received Radiation Psychiatric Complaints and Symptoms: No Complaints or Symptoms Immunizations Pneumococcal Vaccine: Received Pneumococcal Vaccination: No Immunization Notes: up to date Family and Social History Never smoker; Marital Status - Married; Alcohol Use: Never; Drug Use: No History; Caffeine Use: Moderate; Financial Concerns: No; Food, Clothing or Shelter Needs: No; Support System Lacking: No; Transportation Concerns: No; Advanced Directives: No; Patient does not want information on Advanced Directives Physician Affirmation I have reviewed and agree with the above information. Electronic Signature(s) Signed: 01/02/2017 3:11:09 PM By: Christin Fudge MD, FACS Signed: 01/02/2017 4:13:16 PM By: Montey Hora Entered By: Christin Fudge on 01/02/2017 08:40:11 Ryan Burgess ([id number]) -------------------------------------------------------------------------------- SuperBill Details Patient Name: Ryan Burgess Date of Service: 01/02/2017 Medical Record Number: [id number] Patient Account Number: [id number] Date of Birth/Sex: May 16, 1971 (46 y.o. Male) Treating RN: Montey Hora Primary Care Provider: Donavan Burnet Other Clinician: Referring Provider: Donavan Burnet Treating Provider/Extender: Frann Rider in Treatment: 0 Diagnosis Coding ICD-10 Codes Code Description I89.0 Lymphedema, not elsewhere classified L03.115 Cellulitis of right lower limb E66.01 Morbid (severe) obesity due to excess calories R73.03 Prediabetes Facility Procedures CPT4 Code: [id number] Description: 99213 -  WOUND CARE VISIT-LEV 3 EST PT Modifier: Quantity: 1 Physician Procedures CPT4 Code: [id number] Description: [id number] - WC PHYS LEVEL 4 - NEW PT ICD-10 Description Diagnosis I89.0 Lymphedema, not elsewhere classified L03.115 Cellulitis of right lower limb E66.01 Morbid (severe) obesity due to excess calorie R73.03 Prediabetes Modifier: s Quantity: 1 Electronic Signature(s) Signed: 01/02/2017 9:22:03 AM By: Christin Fudge MD, FACS Entered By: Christin Fudge on 01/02/2017 09:22:03

## 2017-01-03 NOTE — Progress Notes (Signed)
Ryan Burgess, Ryan Burgess ([id number]) Visit Report for 01/02/2017 Allergy List Details Patient Name: Ryan Burgess, Ryan Burgess. Date of Service: 01/02/2017 8:00 AM Medical Record Number: [id number] Patient Account Number: [id number] Date of Birth/Sex: 11/18/1971 (46 y.o. Male) Treating RN: Montey Hora Primary Care Zabrina Brotherton: Donavan Burnet Other Clinician: Referring Baruch Lewers: Donavan Burnet Treating Timiyah Romito/Extender: Frann Rider in Treatment: 0 Allergies Active Allergies No Known Allergies Allergy Notes Electronic Signature(s) Signed: 01/02/2017 4:13:16 PM By: Montey Hora Entered By: Montey Hora on 01/02/2017 08:20:07 Ryan Burgess ([id number]) -------------------------------------------------------------------------------- Arrival Information Details Patient Name: Ryan Burgess Date of Service: 01/02/2017 8:00 AM Medical Record Number: [id number] Patient Account Number: [id number] Date of Birth/Sex: 07-15-1971 (46 y.o. Male) Treating RN: Montey Hora Primary Care Aigner Horseman: Donavan Burnet Other Clinician: Referring Jasaun Carn: Donavan Burnet Treating Sheletha Bow/Extender: Frann Rider in Treatment: 0 Visit Information Patient Arrived: Ambulatory Arrival Time: 08:13 Accompanied By: self Transfer Assistance: None Patient Identification Verified: Yes Secondary Verification Process Yes Completed: Patient Has Alerts: Yes Patient Alerts: DMII Electronic Signature(s) Signed: 01/02/2017 4:13:16 PM By: Montey Hora Entered By: Montey Hora on 01/02/2017 08:17:07 Ryan Burgess ([id number]) -------------------------------------------------------------------------------- Clinic Level of Care Assessment Details Patient Name: Ryan Burgess Date of Service: 01/02/2017 8:00 AM Medical Record Number: [id number] Patient Account Number: [id number] Date of Birth/Sex: Sep 29, 1971 (46 y.o. Male) Treating RN: Montey Hora Primary Care Bettymae Yott: Donavan Burnet Other Clinician: Referring Lucita Montoya: Donavan Burnet Treating Alister Staver/Extender: Frann Rider in Treatment: 0 Clinic Level of Care Assessment Items TOOL 1 Quantity Score []  - Use when EandM and Procedure is performed on INITIAL visit 0 ASSESSMENTS - Nursing Assessment / Reassessment X - General Physical Exam (combine w/ comprehensive assessment (listed just 1 20 below) when performed on new pt. evals) X - Comprehensive Assessment (HX, ROS, Risk Assessments, Wounds Hx, etc.) 1 25 ASSESSMENTS - Wound and Skin Assessment / Reassessment []  - Dermatologic / Skin Assessment (not related to wound area) 0 ASSESSMENTS - Ostomy and/or Continence Assessment and Care []  - Incontinence Assessment and Management 0 []  - Ostomy Care Assessment and Management (repouching, etc.) 0 PROCESS - Coordination of Care X - Simple Patient / Family Education for ongoing care 1 15 []  - Complex (extensive) Patient / Family Education for ongoing care 0 []  - Staff obtains Programmer, systems, Records, Test Results / Process Orders 0 []  - Staff telephones HHA, Nursing Homes / Clarify orders / etc 0 []  - Routine Transfer to another Facility (non-emergent condition) 0 []  - Routine Hospital Admission (non-emergent condition) 0 X - New Admissions / Biomedical engineer / Ordering NPWT, Apligraf, etc. 1 15 []  - Emergency Hospital Admission (emergent condition) 0 PROCESS - Special Needs []  - Pediatric / Minor Patient Management 0 []  - Isolation Patient Management 0 Ryan Burgess, Ryan B. ([id number]) []  - Hearing / Language / Visual special needs 0 []  - Assessment of Community assistance (transportation, D/C planning, etc.) 0 []  - Additional assistance / Altered mentation 0 []  - Support Surface(s) Assessment (bed, cushion, seat, etc.) 0 INTERVENTIONS - Miscellaneous []  - External ear exam 0 []  - Patient Transfer (multiple staff / Civil Service fast streamer / Similar devices) 0 []  - Simple Staple / Suture removal (25 or less)  0 []  - Complex Staple / Suture removal (26 or more) 0 []  - Hypo/Hyperglycemic Management (do not check if billed separately) 0 X - Ankle / Brachial Index (ABI) - do not check if billed separately 1 15 Has the patient been seen at the hospital within the last three years: Yes Total Score: 90 Level  Of Care: New/Established - Level 3 Electronic Signature(s) Signed: 01/02/2017 4:13:16 PM By: Montey Hora Entered By: Montey Hora on 01/02/2017 09:05:54 Ryan Burgess ([id number]) -------------------------------------------------------------------------------- Encounter Discharge Information Details Patient Name: Ryan Burgess Date of Service: 01/02/2017 8:00 AM Medical Record Number: [id number] Patient Account Number: [id number] Date of Birth/Sex: Jan 01, 1971 (46 y.o. Male) Treating RN: Montey Hora Primary Care Eithen Castiglia: Donavan Burnet Other Clinician: Referring Jonthan Leite: Donavan Burnet Treating Barbi Kumagai/Extender: Frann Rider in Treatment: 0 Encounter Discharge Information Items Discharge Pain Level: 0 Discharge Condition: Stable Ambulatory Status: Ambulatory Discharge Destination: Home Transportation: Private Auto Accompanied By: self Schedule Follow-up Appointment: Yes Medication Reconciliation completed and provided to Patient/Care No Kenyetta Fife: Provided on Clinical Summary of Care: 01/02/2017 Form Type Recipient Paper Patient TC Electronic Signature(s) Signed: 01/02/2017 9:32:29 AM By: Ruthine Dose Entered By: Ruthine Dose on 01/02/2017 09:32:28 Ryan Burgess ([id number]) -------------------------------------------------------------------------------- Lower Extremity Assessment Details Patient Name: Ryan Burgess Date of Service: 01/02/2017 8:00 AM Medical Record Number: [id number] Patient Account Number: [id number] Date of Birth/Sex: [birth year] (46 y.o. Male) Treating RN: Montey Hora Primary Care Aiysha Jillson: Donavan Burnet Other  Clinician: Referring Annet Manukyan: Donavan Burnet Treating Kera Deacon/Extender: Frann Rider in Treatment: 0 Edema Assessment Assessed: [Left: No] [Right: No] Edema: [Left: Ye] [Right: s] Calf Left: Right: Point of Measurement: 36 cm From Medial Instep cm 68.2 cm Ankle Left: Right: Point of Measurement: 12 cm From Medial Instep cm 44 cm Vascular Assessment Claudication: Claudication Assessment [Right:None] Pulses: Dorsalis Pedis Palpable: [Right:Yes] Posterior Tibial Palpable: [Right:No] Extremity colors, hair growth, and conditions: Extremity Color: [Right:Hyperpigmented] Hair Growth on Extremity: [Right:No] Temperature of Extremity: [Right:Warm] Capillary Refill: [Right:< 3 seconds] Blood Pressure: Brachial: [Right:174] Dorsalis Pedis: [Left:Dorsalis Pedis: 200] Ankle: Posterior Tibial: [Left:Posterior Tibial:] [Right:1.15] Toe Nail Assessment Left: Right: Thick: Yes Discolored: Yes Deformed: No Improper Length and Hygiene: No Ryan Burgess, Ryan Burgess ([id number]) Electronic Signature(s) Signed: 01/02/2017 4:13:16 PM By: Montey Hora Entered By: Montey Hora on 01/02/2017 08:49:14 Ryan Burgess ([id number]) -------------------------------------------------------------------------------- Multi Wound Chart Details Patient Name: Ryan Burgess Date of Service: 01/02/2017 8:00 AM Medical Record Number: [id number] Patient Account Number: [id number] Date of Birth/Sex: 06-19-1971 (46 y.o. Male) Treating RN: Montey Hora Primary Care Haru Anspaugh: Donavan Burnet Other Clinician: Referring Kimie Pidcock: Donavan Burnet Treating Analiz Tvedt/Extender: Frann Rider in Treatment: 0 Vital Signs Height(in): 77 Pulse(bpm): 70 Weight(lbs): 495 Blood Pressure 153/60 (mmHg): Body Mass Index(BMI): 59 Temperature(F): 98.2 Respiratory Rate 16 (breaths/min): Photos: [N/A:N/A] Wound Location: Right Lower Leg - Midline, N/A N/A Anterior Wounding Event: Gradually  Appeared N/A N/A Primary Etiology: Lymphedema N/A N/A Secondary Etiology: Diabetic Wound/Ulcer of N/A N/A the Lower Extremity Comorbid History: Lymphedema, Sleep N/A N/A Apnea, Cirrhosis , Type II Diabetes Ryan Burgess, Ryan Burgess ([id number]) Date Acquired: 12/16/2016 N/A N/A Weeks of Treatment: 0 N/A N/A Wound Status: Open N/A N/A Measurements L x W x D 18x10.5x0.1 N/A N/A (cm) Area (cm) : 148.44 N/A N/A Volume (cm) : 14.844 N/A N/A % Reduction in Area: 0.00% N/A N/A % Reduction in Volume: 0.00% N/A N/A Classification: Partial Thickness N/A N/A HBO Classification: Grade 1 N/A N/A Exudate Amount: Large N/A N/A Exudate Type: Serous N/A N/A Exudate Color: amber N/A N/A Foul Odor After Yes N/A N/A Cleansing: Odor Anticipated Due to No N/A N/A Product Use: Wound Margin: Flat and Intact N/A N/A Granulation Amount: Large (67-100%) N/A N/A Necrotic Amount: Small (1-33%) N/A N/A Exposed Structures: Fascia: No N/A N/A Fat Layer (Subcutaneous Tissue) Exposed: No Tendon: No Muscle: No Joint: No Bone: No Limited to Skin Breakdown  Epithelialization: Large (67-100%) N/A N/A Periwound Skin Texture: Scarring: Yes N/A N/A Excoriation: No Induration: No Callus: No Crepitus: No Rash: No Periwound Skin Maceration: No N/A N/A Moisture: Dry/Scaly: No Periwound Skin Color: Rubor: Yes N/A N/A Atrophie Blanche: No Cyanosis: No Ecchymosis: No Erythema: No Hemosiderin Staining: No Mottled: No Pallor: No Temperature: No Abnormality N/A N/A Tenderness on Yes N/A N/A Palpation: Ryan Burgess, Ryan Burgess ([id number]) Wound Preparation: Ulcer Cleansing: Other: N/A N/A soap and water Topical Anesthetic Applied: None Treatment Notes Electronic Signature(s) Signed: 01/02/2017 9:15:43 AM By: Christin Fudge MD, FACS Entered By: Christin Fudge on 01/02/2017 09:15:43 Ryan Burgess ([id number]) -------------------------------------------------------------------------------- Multi-Disciplinary  Care Plan Details Patient Name: Ryan Burgess Date of Service: 01/02/2017 8:00 AM Medical Record Number: [id number] Patient Account Number: [id number] Date of Birth/Sex: 06-15-1971 (46 y.o. Male) Treating RN: Montey Hora Primary Care Lauren Aguayo: Donavan Burnet Other Clinician: Referring Gearldine Looney: Donavan Burnet Treating Elizabelle Fite/Extender: Frann Rider in Treatment: 0 Active Inactive ` Abuse / Safety / Falls / Self Care Management Nursing Diagnoses: Impaired physical mobility Potential for falls Goals: Patient will remain injury free Date Initiated: 01/02/2017 Target Resolution Date: 04/03/2017 Goal Status: Active Interventions: Assess fall risk on admission and as needed Notes: ` Orientation to the Wound Care Program Nursing Diagnoses: Knowledge deficit related to the wound healing center program Goals: Patient/caregiver will verbalize understanding of the [place] Program Date Initiated: 01/02/2017 Target Resolution Date: 04/03/2017 Goal Status: Active Interventions: Provide education on orientation to the wound center Notes: ` Wound/Skin Impairment Nursing Diagnoses: Impaired tissue integrity Ryan Burgess, Ryan Burgess ([id number]) Goals: Patient/caregiver will verbalize understanding of skin care regimen Date Initiated: 01/02/2017 Target Resolution Date: 04/03/2017 Goal Status: Active Ulcer/skin breakdown will have a volume reduction of 30% by week 4 Date Initiated: 01/02/2017 Target Resolution Date: 04/03/2017 Goal Status: Active Ulcer/skin breakdown will have a volume reduction of 50% by week 8 Date Initiated: 01/02/2017 Target Resolution Date: 04/03/2017 Goal Status: Active Ulcer/skin breakdown will have a volume reduction of 80% by week 12 Date Initiated: 01/02/2017 Target Resolution Date: 04/03/2017 Goal Status: Active Ulcer/skin breakdown will heal within 14 weeks Date Initiated: 01/02/2017 Target Resolution Date: 04/03/2017 Goal Status:  Active Interventions: Assess patient/caregiver ability to obtain necessary supplies Assess patient/caregiver ability to perform ulcer/skin care regimen upon admission and as needed Assess ulceration(s) every visit Notes: Electronic Signature(s) Signed: 01/02/2017 4:13:16 PM By: Montey Hora Entered By: Montey Hora on 01/02/2017 09:00:46 Ryan Burgess ([id number]) -------------------------------------------------------------------------------- Pain Assessment Details Patient Name: Ryan Burgess Date of Service: 01/02/2017 8:00 AM Medical Record Number: [id number] Patient Account Number: [id number] Date of Birth/Sex: [birth year] (46 y.o. Male) Treating RN: Montey Hora Primary Care Lilyauna Miedema: Donavan Burnet Other Clinician: Referring Jeananne Bedwell: Donavan Burnet Treating Carneshia Raker/Extender: Frann Rider in Treatment: 0 Active Problems Location of Pain Severity and Description of Pain Patient Has Paino No Site Locations Pain Management and Medication Current Pain Management: Notes Topical or injectable lidocaine is offered to patient for acute pain when surgical debridement is performed. If needed, Patient is instructed to use over the counter pain medication for the following 24-48 hours after debridement. Wound care MDs do not prescribed pain medications. Patient has chronic pain or uncontrolled pain. Patient has been instructed to make an appointment with their Primary Care Physician for pain management. Electronic Signature(s) Signed: 01/02/2017 4:13:16 PM By: Montey Hora Entered By: Montey Hora on 01/02/2017 08:17:18 Ryan Burgess ([id number]) -------------------------------------------------------------------------------- Patient/Caregiver Education Details Patient Name: Ryan Burgess Date of Service: 01/02/2017 8:00 AM Medical Record Number: [id number]  Patient Account Number: [id number] Date of Birth/Gender: 12/19/1970 (46 y.o. Male) Treating  RN: Montey Hora Primary Care Physician: Donavan Burnet Other Clinician: Referring Physician: Donavan Burnet Treating Physician/Extender: Frann Rider in Treatment: 0 Education Assessment Education Provided To: Patient Education Topics Provided Venous: Handouts: Other: need for compression on left lower leg Methods: Explain/Verbal Responses: State content correctly Electronic Signature(s) Signed: 01/02/2017 4:13:16 PM By: Montey Hora Entered By: Montey Hora on 01/02/2017 Ryan Burgess, Ryan Burgess [facility] ([id number]) -------------------------------------------------------------------------------- Wound Assessment Details Patient Name: Ryan Presto B. Date of Service: 01/02/2017 8:00 AM Medical Record Number: [id number] Patient Account Number: [id number] Date of Birth/Sex: [birth year] (46 y.o. Male) Treating RN: Montey Hora Primary Care Sheryl Saintil: Donavan Burnet Other Clinician: Referring Tashika Goodin: Donavan Burnet Treating Carita Sollars/Extender: Frann Rider in Treatment: 0 Wound Status Wound Number: 1 Primary Lymphedema Etiology: Wound Location: Right Lower Leg - Midline, Anterior Secondary Diabetic Wound/Ulcer of the Lower Etiology: Extremity Wounding Event: Gradually Appeared Wound Status: Open Date Acquired: 12/16/2016 Comorbid Lymphedema, Sleep Apnea, Weeks Of Treatment: 0 History: Cirrhosis , Type II Diabetes Clustered Wound: No Photos Wound Measurements Length: (cm) 18 Width: (cm) 10.5 Depth: (cm) 0.1 Area: (cm) 148.44 Volume: (cm) 14.844 % Reduction in Area: 0% % Reduction in Volume: 0% Epithelialization: Large (67-100%) Tunneling: No Wound Description Classification: Partial Thickness Foul Odor Af Diabetic Severity (Wagner): Grade 1 Due to Produ Wound Margin: Flat and Intact Slough/Fibri Exudate Amount: Large Exudate Type: Serous Exudate Color: amber ter Cleansing: Yes ct Use: No no Yes Wound Bed Granulation Amount: Large  (67-100%) Exposed Structure Necrotic Amount: Small (1-33%) Fascia Exposed: No Necrotic Quality: Adherent Slough Fat Layer (Subcutaneous Tissue) Exposed: No Chancellor, Trebor B. ([id number]) Tendon Exposed: No Muscle Exposed: No Joint Exposed: No Bone Exposed: No Limited to Skin Breakdown Periwound Skin Texture Texture Color No Abnormalities Noted: No No Abnormalities Noted: No Callus: No Atrophie Blanche: No Crepitus: No Cyanosis: No Excoriation: No Ecchymosis: No Induration: No Erythema: No Rash: No Hemosiderin Staining: No Scarring: Yes Mottled: No Pallor: No Moisture Rubor: Yes No Abnormalities Noted: No Dry / Scaly: No Temperature / Pain Maceration: No Temperature: No Abnormality Tenderness on Palpation: Yes Wound Preparation Ulcer Cleansing: Other: soap and water, Topical Anesthetic Applied: None Treatment Notes Wound #1 (Right, Midline, Anterior Lower Leg) 1. Cleansed with: Clean wound with Normal Saline 4. Dressing Applied: Aquacel Ag Other dressing (specify in notes) 5. Secondary Dressing Applied ABD Pad 7. Secured with 4-Layer Compression System - Right Lower Extremity Electronic Signature(s) Signed: 01/02/2017 4:13:16 PM By: Montey Hora Entered By: Montey Hora on 01/02/2017 08:54:04 Ryan Burgess ([id number]) -------------------------------------------------------------------------------- [place] Details Patient Name: Ryan Burgess Date of Service: 01/02/2017 8:00 AM Medical Record Number: [id number] Patient Account Number: [id number] Date of Birth/Sex: March 16, 1971 (46 y.o. Male) Treating RN: Montey Hora Primary Care Tiphani Mells: Donavan Burnet Other Clinician: Referring Cheyann Blecha: Donavan Burnet Treating Rhena Glace/Extender: Frann Rider in Treatment: 0 Vital Signs Time Taken: 08:17 Temperature (F): 98.2 Height (in): 77 Pulse (bpm): 70 Source: Measured Respiratory Rate (breaths/min): 16 Weight (lbs): 495 Blood  Pressure (mmHg): 153/60 Source: Measured Reference Range: 80 - 120 mg / dl Body Mass Index (BMI): 58.7 Electronic Signature(s) Signed: 01/02/2017 4:13:16 PM By: Montey Hora Entered By: Montey Hora on 01/02/2017 08:19:08

## 2017-01-03 NOTE — Progress Notes (Signed)
[name], [name] ([id number]) Visit Report for 01/02/2017 Abuse/Suicide Risk Screen Details Patient Name: Ryan Burgess, Ryan Burgess. Date of Service: 01/02/2017 8:00 AM Medical Record Number: [id number] Patient Account Number: [id number] Date of Birth/Sex: September 17, 1971 (46 y.o. Male) Treating RN: Montey Hora Primary Care Rozetta Stumpp: Donavan Burnet Other Clinician: Referring Math Brazie: Donavan Burnet Treating Aminta Sakurai/Extender: Frann Rider in Treatment: 0 Abuse/Suicide Risk Screen Items Answer ABUSE/SUICIDE RISK SCREEN: Has anyone close to you tried to hurt or harm you recentlyo No Do you feel uncomfortable with anyone in your familyo No Has anyone forced you do things that you didnot want to doo No Do you have any thoughts of harming yourselfo No Patient displays signs or symptoms of abuse and/or neglect. No Electronic Signature(s) Signed: 01/02/2017 4:13:16 PM By: Montey Hora Entered By: Montey Hora on 01/02/2017 08:20:17 Ryan Burgess ([id number]) -------------------------------------------------------------------------------- Activities of Daily Living Details Patient Name: [name], [name] Date of Service: 01/02/2017 8:00 AM Medical Record Number: [id number] Patient Account Number: [id number] Date of Birth/Sex: Nov 05, 1971 (46 y.o. Male) Treating RN: Montey Hora Primary Care Brinlee Gambrell: Donavan Burnet Other Clinician: Referring Tiamarie Furnari: Donavan Burnet Treating Gurjot Brisco/Extender: Frann Rider in Treatment: 0 Activities of Daily Living Items Answer Activities of Daily Living (Please select one for each item) Drive Automobile Completely Able Take Medications Completely Able Use Telephone Completely Able Care for Appearance Completely Able Use Toilet Completely Able Bath / Shower Completely Able Dress Self Completely Able Feed Self Completely Able Walk Completely Able Get In / Out Bed Completely Able Housework Completely Able Prepare Meals  Completely [place] for Self Completely Able Electronic Signature(s) Signed: 01/02/2017 4:13:16 PM By: Montey Hora Entered By: Montey Hora on 01/02/2017 08:20:32 Ryan Burgess ([id number]) -------------------------------------------------------------------------------- Education Assessment Details Patient Name: Ryan Burgess Date of Service: 01/02/2017 8:00 AM Medical Record Number: [id number] Patient Account Number: [id number] Date of Birth/Sex: 1971-11-01 (46 y.o. Male) Treating RN: Montey Hora Primary Care Montanna Mcbain: Donavan Burnet Other Clinician: Referring Tahra Hitzeman: Donavan Burnet Treating Caidyn Blossom/Extender: Frann Rider in Treatment: 0 Primary Learner Assessed: Patient Learning Preferences/Education Level/Primary Language Learning Preference: Explanation, Demonstration Highest Education Level: College or Above Preferred Language: English Cognitive Barrier Assessment/Beliefs Language Barrier: No Translator Needed: No Memory Deficit: No Emotional Barrier: No Cultural/Religious Beliefs Affecting Medical No Care: Physical Barrier Assessment Impaired Vision: No Impaired Hearing: No Decreased Hand dexterity: No Knowledge/Comprehension Assessment Knowledge Level: Medium Comprehension Level: Medium Ability to understand written Medium instructions: Ability to understand verbal Medium instructions: Motivation Assessment Anxiety Level: Calm Cooperation: Cooperative Education Importance: Acknowledges Need Interest in Health Problems: Asks Questions Perception: Coherent Willingness to Engage in Self- Medium Management Activities: Readiness to Engage in Self- Medium Management Activities: Electronic Signature(s) MARQAVIOUS, BROUILLET ([id number]) Signed: 01/02/2017 4:13:16 PM By: Montey Hora Entered By: Montey Hora on 01/02/2017 08:20:55 Ryan Burgess  ([id number]) -------------------------------------------------------------------------------- Fall Risk Assessment Details Patient Name: Ryan Burgess Date of Service: 01/02/2017 8:00 AM Medical Record Number: [id number] Patient Account Number: [id number] Date of Birth/Sex: 1971-05-12 (46 y.o. Male) Treating RN: Montey Hora Primary Care Geneive Sandstrom: Donavan Burnet Other Clinician: Referring Liah Morr: Donavan Burnet Treating Elzie Knisley/Extender: Frann Rider in Treatment: 0 Fall Risk Assessment Items Have you had 2 or more falls in the last 12 monthso 0 No Have you had any fall that resulted in injury in the last 12 monthso 0 No FALL RISK ASSESSMENT: History of falling - immediate or within 3 months 0 No Secondary diagnosis 0 No Ambulatory aid None/bed rest/wheelchair/nurse 0 Yes Crutches/cane/walker 0 No Furniture  0 No IV Access/Saline Lock 0 No Gait/Training Normal/bed rest/immobile 0 No Weak 10 Yes Impaired 0 No Mental Status Oriented to own ability 0 Yes Electronic Signature(s) Signed: 01/02/2017 4:13:16 PM By: Montey Hora Entered By: Montey Hora on 01/02/2017 08:21:06 Ryan Burgess ([id number]) -------------------------------------------------------------------------------- Foot Assessment Details Patient Name: Ryan Presto B. Date of Service: 01/02/2017 8:00 AM Medical Record Number: [id number] Patient Account Number: [id number] Date of Birth/Sex: Dec 26, 1970 (46 y.o. Male) Treating RN: Montey Hora Primary Care Meagon Duskin: Donavan Burnet Other Clinician: Referring Tata Timmins: Donavan Burnet Treating Alberta Cairns/Extender: Frann Rider in Treatment: 0 Foot Assessment Items Site Locations + = Sensation present, - = Sensation absent, C = Callus, U = Ulcer R = Redness, W = Warmth, M = Maceration, PU = Pre-ulcerative lesion F = Fissure, S = Swelling, D = Dryness Assessment Right: Left: Other Deformity: No No Prior Foot Ulcer: No No Prior  Amputation: No No Charcot Joint: No No Ambulatory Status: Ambulatory Without Help Gait: Steady Electronic Signature(s) Signed: 01/02/2017 4:13:16 PM By: Montey Hora Entered By: Montey Hora on 01/02/2017 08:45:02 Ryan Burgess ([id number]) -------------------------------------------------------------------------------- Nutrition Risk Assessment Details Patient Name: Ryan Burgess Date of Service: 01/02/2017 8:00 AM Medical Record Number: [id number] Patient Account Number: [id number] Date of Birth/Sex: 06-28-1971 (46 y.o. Male) Treating RN: Montey Hora Primary Care Burt Piatek: Donavan Burnet Other Clinician: Referring Lasalle Abee: Donavan Burnet Treating Eliani Leclere/Extender: Frann Rider in Treatment: 0 Height (in): 77 Weight (lbs): 495 Body Mass Index (BMI): 58.7 Nutrition Risk Assessment Items NUTRITION RISK SCREEN: I have an illness or condition that made me change the kind and/or 0 No amount of food I eat I eat fewer than two meals per day 0 No I eat few fruits and vegetables, or milk products 0 No I have three or more drinks of beer, liquor or wine almost every day 0 No I have tooth or mouth problems that make it hard for me to eat 0 No I don't always have enough money to buy the food I need 0 No I eat alone most of the time 0 No I take three or more different prescribed or over-the-counter drugs a 1 Yes day Without wanting to, I have lost or gained 10 pounds in the last six 0 No months I am not always physically able to shop, cook and/or feed myself 0 No Nutrition Protocols Good Risk Protocol 0 No interventions needed Moderate Risk Protocol Electronic Signature(s) Signed: 01/02/2017 4:13:16 PM By: Montey Hora Entered By: Montey Hora on 01/02/2017 08:21:12

## 2017-01-09 ENCOUNTER — Encounter: Payer: BLUE CROSS/BLUE SHIELD | Admitting: Surgery

## 2017-01-09 DIAGNOSIS — I890 Lymphedema, not elsewhere classified: Secondary | ICD-10-CM | POA: Diagnosis not present

## 2017-01-10 NOTE — Progress Notes (Signed)
ALFARD, LAVORGNA ([id number]) Visit Report for 01/09/2017 Arrival Information Details Patient Name: Ryan Burgess, Ryan Burgess. Date of Service: 01/09/2017 9:15 AM Medical Record Number: [id number] Patient Account Number: [id number] Date of Birth/Sex: 11-27-1971 (46 y.o. Male) Treating RN: Montey Hora Primary Care Timera Windt: Donavan Burnet Other Clinician: Referring Naz Denunzio: Donavan Burnet Treating Kenyada Dosch/Extender: Frann Rider in Treatment: 1 Visit Information History Since Last Visit Added or deleted any medications: No Patient Arrived: Ambulatory Any new allergies or adverse reactions: No Arrival Time: 09:11 Had a fall or experienced change in No Accompanied By: self activities of daily living that may affect Transfer Assistance: None risk of falls: Patient Identification Verified: Yes Signs or symptoms of abuse/neglect since last No Secondary Verification Process Yes visito Completed: Hospitalized since last visit: No Patient Has Alerts: Yes Has Dressing in Place as Prescribed: Yes Patient Alerts: DMII Has Compression in Place as Prescribed: Yes Pain Present Now: No Electronic Signature(s) Signed: 01/09/2017 5:25:14 PM By: Montey Hora Entered By: Montey Hora on 01/09/2017 09:11:48 Ryan Burgess ([id number]) -------------------------------------------------------------------------------- Encounter Discharge Information Details Patient Name: Ryan Burgess Date of Service: 01/09/2017 9:15 AM Medical Record Number: [id number] Patient Account Number: [id number] Date of Birth/Sex: 03-10-1971 (46 y.o. Male) Treating RN: Montey Hora Primary Care Tomothy Eddins: Donavan Burnet Other Clinician: Referring Bellamia Ferch: Donavan Burnet Treating Machi Whittaker/Extender: Frann Rider in Treatment: 1 Encounter Discharge Information Items Schedule Follow-up Appointment: No Medication Reconciliation completed No and provided to Patient/Care Makih Stefanko: Provided  on Clinical Summary of Care: 01/09/2017 Form Type Recipient Paper Patient TC Electronic Signature(s) Signed: 01/09/2017 10:10:28 AM By: Ruthine Dose Entered By: Ruthine Dose on 01/09/2017 10:10:28 Ryan Burgess ([id number]) -------------------------------------------------------------------------------- Lower Extremity Assessment Details Patient Name: Ryan Burgess Date of Service: 01/09/2017 9:15 AM Medical Record Number: [id number] Patient Account Number: [id number] Date of Birth/Sex: 14-Apr-1971 (46 y.o. Male) Treating RN: Montey Hora Primary Care Latrel Szymczak: Donavan Burnet Other Clinician: Referring Lacole Komorowski: Donavan Burnet Treating Mikaila Grunert/Extender: Frann Rider in Treatment: 1 Edema Assessment Assessed: [Left: No] [Right: No] Edema: [Left: Ye] [Right: s] Calf Left: Right: Point of Measurement: 36 cm From Medial Instep cm 54.8 cm Ankle Left: Right: Point of Measurement: 12 cm From Medial Instep cm 40.2 cm Vascular Assessment Pulses: Dorsalis Pedis Palpable: [Right:Yes] Posterior Tibial Extremity colors, hair growth, and conditions: Extremity Color: [Right:Hyperpigmented] Hair Growth on Extremity: [Right:No] Temperature of Extremity: [Right:Warm] Capillary Refill: [Right:< 3 seconds] Electronic Signature(s) Signed: 01/09/2017 5:25:14 PM By: Montey Hora Entered By: Montey Hora on 01/09/2017 09:24:11 Ryan Burgess ([id number]) -------------------------------------------------------------------------------- Multi Wound Chart Details Patient Name: Ryan Burgess Date of Service: 01/09/2017 9:15 AM Medical Record Number: [id number] Patient Account Number: [id number] Date of Birth/Sex: 1971/11/09 (46 y.o. 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 ([id number]) Foul Odor After Yes N/A N/A Cleansing: Odor Anticipated Due to No N/A N/A Product Use: Wound Margin: Flat and Intact N/A N/A Granulation Amount: Large (67-100%) N/A N/A Necrotic Amount: None Present (0%) N/A N/A Exposed Structures: Fascia: No N/A N/A Fat Layer (Subcutaneous Tissue) Exposed: No Tendon: No Muscle: No Joint: No Bone: No Limited to Skin Breakdown Epithelialization: Large (67-100%) N/A N/A Periwound Skin Texture: Scarring: Yes N/A N/A Excoriation: No Induration: No Callus: No Crepitus: No Rash: No Periwound Skin Maceration: No N/A N/A Moisture: Dry/Scaly: No Periwound Skin Color: Rubor: Yes N/A N/A Atrophie Blanche: No Cyanosis: No Ecchymosis: No Erythema: No Hemosiderin Staining: No Mottled: No Pallor: No Temperature: No Abnormality N/A N/A Tenderness on Yes N/A N/A Palpation: Wound Preparation: Ulcer Cleansing: Other: N/A N/A soap and water Topical Anesthetic Applied: None Treatment Notes Electronic Signature(s) Signed: 01/09/2017 9:36:19  AM By: Christin Fudge MD, FACS Entered By: Christin Fudge on 01/09/2017 09:36:19 Ryan Burgess ([id number]) -------------------------------------------------------------------------------- [place] Details Patient Name: Ryan Burgess Date of Service: 01/09/2017 9:15 AM Medical Record Number: [id number] Patient Account Number: [id number] Date of Birth/Sex: 30-Mar-1971 (46 y.o. Male) Treating RN: Montey Hora Primary Care Tyrica Afzal: Donavan Burnet Other Clinician: Referring Vara Mairena: Donavan Burnet Treating Meshawn Oconnor/Extender: Frann Rider in Treatment: 1 Active Inactive ` Abuse / Safety / Falls / Self Care Management Nursing Diagnoses: Impaired physical mobility Potential for falls Goals: Patient will remain injury free Date Initiated: 01/02/2017 Target Resolution Date: 04/03/2017 Goal Status: Active Interventions: Assess fall risk on admission and as needed Notes: ` Orientation to the Wound Care Program Nursing Diagnoses: Knowledge deficit related to the wound healing center program Goals: Patient/caregiver will verbalize understanding of the [facility] Date Initiated: 01/02/2017 Target Resolution Date: 04/03/2017 Goal Status: Active Interventions: Provide education on orientation to the wound center Notes: ` Wound/Skin Impairment Nursing Diagnoses: Impaired tissue integrity Ryan Burgess, Ryan Burgess ([id number]) Goals: Patient/caregiver will verbalize understanding of skin care regimen Date Initiated: 01/02/2017 Target Resolution Date: 04/03/2017 Goal Status: Active Ulcer/skin breakdown will have a volume reduction of 30% by week 4 Date Initiated: 01/02/2017 Target Resolution Date: 04/03/2017 Goal Status: Active Ulcer/skin breakdown will have a volume reduction of 50% by week 8 Date Initiated: 01/02/2017 Target Resolution Date: 04/03/2017 Goal Status: Active Ulcer/skin breakdown will have a volume reduction of 80% by week 12 Date  Initiated: 01/02/2017 Target Resolution Date: 04/03/2017 Goal Status: Active Ulcer/skin breakdown will heal within 14 weeks Date Initiated: 01/02/2017 Target Resolution Date: 04/03/2017 Goal Status: Active Interventions: Assess patient/caregiver ability to obtain necessary supplies Assess patient/caregiver ability to perform ulcer/skin care regimen upon admission and as needed Assess ulceration(s) every visit Notes: Electronic Signature(s) Signed: 01/09/2017 5:25:14 PM By: Montey Hora Entered By: Montey Hora on 01/09/2017 09:24:17 Ryan Burgess ([id number]) -------------------------------------------------------------------------------- Pain Assessment Details Patient Name: Ryan Burgess Date of Service: 01/09/2017 9:15 AM Medical Record Number: [id number] Patient Account Number: [id number] Date of Birth/Sex: 1971/07/26 (46 y.o. Male) Treating RN: Montey Hora Primary Care Daved Mcfann: Donavan Burnet Other Clinician: Referring Lewayne Pauley: Donavan Burnet Treating Yuma Pacella/Extender: Frann Rider in Treatment: 1 Active Problems Location of Pain Severity and Description of Pain Patient Has Paino No Site Locations Pain Management and Medication Current Pain Management: Notes Topical or injectable lidocaine is offered to patient for acute pain when surgical debridement is performed. If  needed, Patient is instructed to use over the counter pain medication for the following 24-48 hours after debridement. Wound care MDs do not prescribed pain medications. Patient has chronic pain or uncontrolled pain. Patient has been instructed to make an appointment with their Primary Care Physician for pain management. Electronic Signature(s) Signed: 01/09/2017 5:25:14 PM By: Montey Hora Entered By: Montey Hora on 01/09/2017 09:12:04 Ryan Burgess ([id number]) -------------------------------------------------------------------------------- Wound Assessment Details Patient  Name: Ryan Burgess Date of Service: 01/09/2017 9:15 AM Medical Record Number: [id number] Patient Account Number: [id number] Date of Birth/Sex: 01-Sep-1971 (46 y.o. Male) Treating RN: Montey Hora Primary Care Deontay Ladnier: Donavan Burnet Other Clinician: Referring Makhi Muzquiz: Donavan Burnet Treating Gavina Dildine/Extender: Frann Rider in Treatment: 1 Wound Status Wound Number: 1 Primary Lymphedema Etiology: Wound Location: Right Lower Leg - Midline, Anterior Secondary Diabetic Wound/Ulcer of the Lower Etiology: Extremity Wounding Event: Gradually Appeared Wound Status: Open Date Acquired: 12/16/2016 Comorbid Lymphedema, Sleep Apnea, Weeks Of Treatment: 1 History: Cirrhosis , Type II Diabetes Clustered Wound: No Photos Wound Measurements Length: (cm) 6 Width: (cm) 1 Depth: (cm) 0.1 Area: (cm) 4.712 Volume: (cm) 0.471 % Reduction in Area: 96.8% % Reduction in Volume: 96.8% Epithelialization: Large (67-100%) Tunneling: No Undermining: No Wound Description Classification: Partial Thickness Foul Odor A Diabetic Severity (Wagner): Grade 1 Due to Prod Wound Margin: Flat and Intact Slough/Fibr Exudate Amount: Large Exudate Type: Serous Exudate Color: amber fter Cleansing: Yes uct Use: No ino Yes Wound Bed Granulation Amount: Large (67-100%) Exposed Structure Necrotic Amount: None Present (0%) Fascia Exposed: No Fat Layer (Subcutaneous Tissue) Exposed: No Ryan Burgess, Ryan B. ([id number]) Tendon Exposed: No Muscle Exposed: No Joint Exposed: No Bone Exposed: No Limited to Skin Breakdown Periwound Skin Texture Texture Color No Abnormalities Noted: No No Abnormalities Noted: No Callus: No Atrophie Blanche: No Crepitus: No Cyanosis: No Excoriation: No Ecchymosis: No Induration: No Erythema: No Rash: No Hemosiderin Staining: No Scarring: Yes Mottled: No Pallor: No Moisture Rubor: Yes No Abnormalities Noted: No Dry / Scaly: No Temperature /  Pain Maceration: No Temperature: No Abnormality Tenderness on Palpation: Yes Wound Preparation Ulcer Cleansing: Other: soap and water, Topical Anesthetic Applied: None Electronic Signature(s) Signed: 01/09/2017 5:25:14 PM By: Montey Hora Entered By: Montey Hora on 01/09/2017 09:27:41 Ryan Burgess ([id number]) -------------------------------------------------------------------------------- Vitals Details Patient Name: Ryan Burgess Date of Service: 01/09/2017 9:15 AM Medical Record Number: [id number] Patient Account Number: [id number] Date of Birth/Sex: 1971/01/20 (46 y.o. Male) Treating RN: Montey Hora Primary Care Lilygrace Rodick: Donavan Burnet Other Clinician: Referring Kasten Leveque: Donavan Burnet Treating Shanine Kreiger/Extender: Frann Rider in Treatment: 1 Vital Signs Time Taken: 09:12 Temperature (F): 98.4 Height (in): 77 Pulse (bpm): 74 Weight (lbs): 495 Respiratory Rate (breaths/min): 16 Body Mass Index (BMI): 58.7 Blood Pressure (mmHg): 132/57 Reference Range: 80 - 120 mg / dl Electronic Signature(s) Signed: 01/09/2017 5:25:14 PM By: Montey Hora Entered By: Montey Hora on 01/09/2017 09:14:31

## 2017-01-10 NOTE — Progress Notes (Signed)
Ryan Burgess, Ryan Burgess ([id number]) Visit Report for 01/09/2017 Chief Complaint Document Details Patient Name: Ryan Burgess, Ryan Burgess. Date of Service: 01/09/2017 9:15 AM Medical Record Number: [id number] Patient Account Number: [id number] Date of Birth/Sex: 03-05-1971 (46 y.o. Male) Treating RN: Montey Hora Primary Care Provider: Donavan Burnet Other Clinician: Referring Provider: Donavan Burnet Treating Provider/Extender: Frann Rider in Treatment: 1 Information Obtained from: Patient Chief Complaint Patient presents to the wound care center for a consult due non healing wound to the right lower extremity with extensive swelling over the last several years Electronic Signature(s) Signed: 01/09/2017 9:36:26 AM By: Christin Fudge MD, FACS Entered By: Christin Fudge on 01/09/2017 09:36:26 Ryan Burgess ([id number]) -------------------------------------------------------------------------------- HPI Details Patient Name: Ryan Burgess Date of Service: 01/09/2017 9:15 AM Medical Record Number: [id number] Patient Account Number: [id number] Date of Birth/Sex: 1971-11-20 (46 y.o. Male) Treating RN: Montey Hora Primary Care Provider: Donavan Burnet Other Clinician: Referring Provider: Donavan Burnet Treating Provider/Extender: Frann Rider in Treatment: 1 History of Present Illness Location: bilateral lower extremity swelling with weeping and drainage from the right lower extremity Quality: Patient reports experiencing heaviness to affected area(s). Severity: Patient states wound are getting worse. Duration: Patient has had the wound for > 3 months prior to seeking treatment at the wound center Timing: Pain in wound is Intermittent (comes and goes Context: The wound appeared gradually over time Modifying Factors: Other treatment(s) tried include:recently admitted to the hospital with cellulitis and sepsis Associated Signs and Symptoms: Patient reports having increase  discharge. HPI Description: 46 year old gentleman who was recently hospitalized for cellulitis of the right calf, lymphedema, morbid obesity and depression has been on clindamycin, and was referred to [place] for management of cellulitis and lymphedema. Past medical history significant for diabetes mellitus, TIA, edema, morbid obesity, sleep apnea, lymphedema and depression. He is not a smoker. he was recently admitted to the hospital between January 18 and 12/23/2016 for sepsis due to cellulitis of the right leg with hypertension, morbid obesity, lymphedema, acute kidney injury, hepatic cirrhosis and flulike symptoms. during his hospital course he was given vancomycin and Zosyn. he was discharged home on oral clindamycin. He is homebound at present and has not been working. 01/09/2017 -- the patient has not yet heard back from the lymphedema clinic and I have urged him to be in contact with them. The compression wraps which are going to be custom-made have been scheduled for this coming Tuesday. Electronic Signature(s) Signed: 01/09/2017 9:36:57 AM By: Christin Fudge MD, FACS Entered By: Christin Fudge on 01/09/2017 09:36:57 Ryan Burgess ([id number]) -------------------------------------------------------------------------------- Physical Exam Details Patient Name: Ryan Burgess Date of Service: 01/09/2017 9:15 AM Medical Record Number: [id number] Patient Account Number: [id number] Date of Birth/Sex: 02-Feb-1971 (46 y.o. Male) Treating RN: Montey Hora Primary Care Provider: Donavan Burnet Other Clinician: Referring Provider: Donavan Burnet Treating Provider/Extender: Frann Rider in Treatment: 1 Constitutional . Pulse regular. Respirations normal and unlabored. Afebrile. . Eyes Nonicteric. Reactive to light. Ears, Nose, Mouth, and Throat Lips, teeth, and gums WNL.Marland Kitchen Moist mucosa without lesions. Neck supple and nontender. No palpable supraclavicular or cervical  adenopathy. Normal sized without goiter. Respiratory WNL. No retractions.. Breath sounds WNL, No rubs, rales, rhonchi, or wheeze.. Cardiovascular Heart rhythm and rate regular, no murmur or gallop.. Pedal Pulses WNL. No clubbing, cyanosis or edema. Chest Breasts symmetical and no nipple discharge.. Breast tissue WNL, no masses, lumps, or tenderness.. Lymphatic No adneopathy. No adenopathy. No adenopathy. Musculoskeletal Adexa without tenderness or enlargement.. Digits and nails w/o clubbing, cyanosis, infection,  petechiae, ischemia, or inflammatory conditions.. Integumentary (Hair, Skin) No suspicious lesions. No crepitus or fluctuance. No peri-wound warmth or erythema. No masses.Marland Kitchen Psychiatric Judgement and insight Intact.. No evidence of depression, anxiety, or agitation.. Notes bilateral lymphedema stage III with the left being more than the right and the weeping and ulceration has gone down but minimally present. No sharp debridement was required today. Electronic Signature(s) Signed: 01/09/2017 9:37:26 AM By: Christin Fudge MD, FACS Entered By: Christin Fudge on 01/09/2017 09:37:26 Ryan Burgess ([id number]) -------------------------------------------------------------------------------- Physician Orders Details Patient Name: Ryan Burgess Date of Service: 01/09/2017 9:15 AM Medical Record Number: [id number] Patient Account Number: [id number] Date of Birth/Sex: 03/03/1971 (46 y.o. Male) Treating RN: Montey Hora Primary Care Provider: Donavan Burnet Other Clinician: Referring Provider: Donavan Burnet Treating Provider/Extender: Frann Rider in Treatment: 1 Verbal / Phone Orders: Yes Clinician: Montey Hora Read Back and Verified: Yes Diagnosis Coding Wound Cleansing Wound #1 Right,Midline,Anterior Lower Leg o Clean wound with Normal Saline. o May shower with protection. Primary Wound Dressing Wound #1 Right,Midline,Anterior Lower Leg o Aquacel  Ag - to any areas that are draining if needed Secondary Dressing Wound #1 Right,Midline,Anterior Lower Leg o ABD pad - if needed to any areas that are draining o XtraSorb Dressing Change Frequency Wound #1 Right,Midline,Anterior Lower Leg o Three times weekly - [facility] please wrap after appointment on Tuesday after appointment to get measured for custom compression Follow-up Appointments Wound #1 Right,Midline,Anterior Lower Leg o Return Appointment in 1 week. Edema Control Wound #1 Right,Midline,Anterior Lower Leg o 4 Layer Compression System - Bilateral - r/t extensive lymphedema Additional Orders / Instructions Wound #1 Right,Midline,Anterior Lower Leg o Increase protein intake. o Other: - please add vitamin a, vitamin c and zinc supplements to your diet Home Health Wound #1 [place] [facility] Ryan Burgess, Ryan Burgess ([id number] Nurse may visit PRN to address patientos wound care needs. o FACE TO FACE ENCOUNTER: MEDICARE and MEDICAID PATIENTS: I certify that this patient is under my care and that I had a face-to-face encounter that meets the physician face-to-face encounter requirements with this patient on this date. The encounter with the patient was in whole or in part for the following MEDICAL CONDITION: (primary reason for [place]) MEDICAL NECESSITY: I certify, that based on my findings, NURSING services are a medically necessary home health service. HOME BOUND STATUS: I certify that my clinical findings support that this patient is homebound (i.e., Due to illness or injury, pt requires aid of supportive devices such as crutches, cane, wheelchairs, walkers, the use of special transportation or the assistance of another person to leave their place of residence. There is a normal inability to leave the home and doing so requires considerable and taxing effort. Other absences are for medical reasons /  religious services and are infrequent or of short duration when for other reasons). o If current dressing causes regression in wound condition, may D/C ordered dressing product/s and apply Normal Saline Moist Dressing daily until next [place] / Other MD appointment. [place] of regression in wound condition at [phone]. o Please direct any NON-WOUND related issues/requests for orders to patient's Primary Care Physician Electronic Signature(s) Signed: 01/09/2017 4:21:54 PM By: Christin Fudge MD, FACS Entered By: Christin Fudge on 01/09/2017 09:37:34 Ryan Burgess ([id number]) -------------------------------------------------------------------------------- Problem List Details Patient Name: Ryan Burgess Date of Service: 01/09/2017 9:15 AM Medical Record Number: [id number] Patient Account Number: [id number] Date of Birth/Sex: 1971/05/05 (45  y.o. Male) Treating RN: Montey Hora Primary Care Provider: Donavan Burnet Other Clinician: Referring Provider: Donavan Burnet Treating Provider/Extender: Frann Rider in Treatment: 1 Active Problems ICD-10 Encounter Code Description Active Date Diagnosis I89.0 Lymphedema, not elsewhere classified 01/02/2017 Yes L03.115 Cellulitis of right lower limb 01/02/2017 Yes E66.01 Morbid (severe) obesity due to excess calories 01/02/2017 Yes R73.03 Prediabetes 01/02/2017 Yes Inactive Problems Resolved Problems Electronic Signature(s) Signed: 01/09/2017 9:36:14 AM By: Christin Fudge MD, FACS Entered By: Christin Fudge on 01/09/2017 09:36:13 Ryan Burgess ([id number]) -------------------------------------------------------------------------------- Progress Note Details Patient Name: Ryan Burgess Date of Service: 01/09/2017 9:15 AM Medical Record Number: [id number] Patient Account Number: [id number] Date of Birth/Sex: [birth year] (46 y.o. Male) Treating RN: Montey Hora Primary Care Provider:  Donavan Burnet Other Clinician: Referring Provider: Donavan Burnet Treating Provider/Extender: Frann Rider in Treatment: 1 Subjective Chief Complaint Information obtained from Patient Patient presents to the wound care center for a consult due non healing wound to the right lower extremity with extensive swelling over the last several years History of Present Illness (HPI) The following HPI elements were documented for the patient's wound: Location: bilateral lower extremity swelling with weeping and drainage from the right lower extremity Quality: Patient reports experiencing heaviness to affected area(s). Severity: Patient states wound are getting worse. Duration: Patient has had the wound for > 3 months prior to seeking treatment at the wound center Timing: Pain in wound is Intermittent (comes and goes Context: The wound appeared gradually over time Modifying Factors: Other treatment(s) tried include:recently admitted to the hospital with cellulitis and sepsis Associated Signs and Symptoms: Patient reports having increase discharge. 46 year old gentleman who was recently hospitalized for cellulitis of the right calf, lymphedema, morbid obesity and depression has been on clindamycin, and was referred to [place] for management of cellulitis and lymphedema. Past medical history significant for diabetes mellitus, TIA, edema, morbid obesity, sleep apnea, lymphedema and depression. He is not a smoker. he was recently admitted to the hospital between January 18 and 12/23/2016 for sepsis due to cellulitis of the right leg with hypertension, morbid obesity, lymphedema, acute kidney injury, hepatic cirrhosis and flulike symptoms. during his hospital course he was given vancomycin and Zosyn. he was discharged home on oral clindamycin. He is homebound at present and has not been working. 01/09/2017 -- the patient has not yet heard back from the lymphedema clinic and I have urged him to be  in contact with them. The compression wraps which are going to be custom-made have been scheduled for this coming Tuesday. Objective Constitutional Ryan Burgess, Ryan B. ([id number]) Pulse regular. Respirations normal and unlabored. Afebrile. Vitals Time Taken: 9:12 AM, Height: 77 in, Weight: 495 lbs, BMI: 58.7, Temperature: 98.4 F, Pulse: 74 bpm, Respiratory Rate: 16 breaths/min, Blood Pressure: 132/57 mmHg. Eyes Nonicteric. Reactive to light. Ears, Nose, Mouth, and Throat Lips, teeth, and gums WNL.Marland Kitchen Moist mucosa without lesions. Neck supple and nontender. No palpable supraclavicular or cervical adenopathy. Normal sized without goiter. Respiratory WNL. No retractions.. Breath sounds WNL, No rubs, rales, rhonchi, or wheeze.. Cardiovascular Heart rhythm and rate regular, no murmur or gallop.. Pedal Pulses WNL. No clubbing, cyanosis or edema. Chest Breasts symmetical and no nipple discharge.. Breast tissue WNL, no masses, lumps, or tenderness.. Lymphatic No adneopathy. No adenopathy. No adenopathy. Musculoskeletal Adexa without tenderness or enlargement.. Digits and nails w/o clubbing, cyanosis, infection, petechiae, ischemia, or inflammatory conditions.Marland Kitchen Psychiatric Judgement and insight Intact.. No evidence of depression, anxiety, or agitation.. General Notes: bilateral lymphedema stage III with the left being more  than the right and the weeping and ulceration has gone down but minimally present. No sharp debridement was required today. Integumentary (Hair, Skin) No suspicious lesions. No crepitus or fluctuance. No peri-wound warmth or erythema. No masses.. Wound #1 status is Open. Original cause of wound was Gradually Appeared. The wound is located on the Right,Midline,Anterior Lower Leg. The wound measures 6cm length x 1cm width x 0.1cm depth; 4.712cm^2 area and 0.471cm^3 volume. The wound is limited to skin breakdown. There is no tunneling or undermining noted. There is a large  amount of serous drainage noted. The wound margin is flat and intact. There is large (67-100%) granulation within the wound bed. There is no necrotic tissue within the wound bed. The periwound skin appearance exhibited: Scarring, Rubor. The periwound skin appearance did not exhibit: Callus, Crepitus, Excoriation, Induration, Rash, Dry/Scaly, Maceration, Atrophie Blanche, Cyanosis, Ecchymosis, Hemosiderin Staining, Mottled, Pallor, Erythema. Periwound temperature was noted as No Abnormality. The periwound has tenderness on palpation. Ryan Burgess, Ryan Burgess ([id number]) Assessment Active Problems ICD-10 I89.0 - Lymphedema, not elsewhere classified L03.115 - Cellulitis of right lower limb E66.01 - Morbid (severe) obesity due to excess calories R73.03 - Prediabetes Plan Wound Cleansing: Wound #1 Right,Midline,Anterior Lower Leg: Clean wound with Normal Saline. May shower with protection. Primary Wound Dressing: Wound #1 Right,Midline,Anterior Lower Leg: Aquacel Ag - to any areas that are draining if needed Secondary Dressing: Wound #1 Right,Midline,Anterior Lower Leg: ABD pad - if needed to any areas that are draining XtraSorb Dressing Change Frequency: Wound #1 Right,Midline,Anterior Lower Leg: Three times weekly - [facility] please wrap after appointment on Tuesday after appointment to get measured for custom compression Follow-up Appointments: Wound #1 Right,Midline,Anterior Lower Leg: Return Appointment in 1 week. Edema Control: Wound #1 Right,Midline,Anterior Lower Leg: 4 Layer Compression System - Bilateral - r/t extensive lymphedema Additional Orders / Instructions: Wound #1 Right,Midline,Anterior Lower Leg: Increase protein intake. Other: - please add vitamin a, vitamin c and zinc supplements to your diet Home Health: Wound #1 Right,Midline,Anterior Lower Leg: [place] Nurse may visit PRN to address patient s wound care needs. FACE TO FACE  ENCOUNTER: MEDICARE and MEDICAID PATIENTS: I certify that this patient is under my care and that I had a face-to-face encounter that meets the physician face-to-face encounter Ryan Burgess, Ryan Burgess ([id number]) requirements with this patient on this date. The encounter with the patient was in whole or in part for the following MEDICAL CONDITION: (primary reason for [place]) MEDICAL NECESSITY: I certify, that based on my findings, NURSING services are a medically necessary home health service. HOME BOUND STATUS: I certify that my clinical findings support that this patient is homebound (i.e., Due to illness or injury, pt requires aid of supportive devices such as crutches, cane, wheelchairs, walkers, the use of special transportation or the assistance of another person to leave their place of residence. There is a normal inability to leave the home and doing so requires considerable and taxing effort. Other absences are for medical reasons / religious services and are infrequent or of short duration when for other reasons). If current dressing causes regression in wound condition, may D/C ordered dressing product/s and apply Normal Saline Moist Dressing daily until next [place] / Other MD appointment. [place] of regression in wound condition at [phone]. Please direct any NON-WOUND related issues/requests for orders to patient's Primary Care Physician The patient's care is going to be taken over by the lymphedema clinic and we are getting him compression  stockings which are custom made. After review today I have recommended: 1. Silver alginate and a 4-layer Profore, bilaterally, to be changed 3 times a week. 2. elevation and exercise 3. Appropriate control of his weight and to consider bariatric surgery 4. Regular visits to the wound center 5. After his acute situation has been sorted out he will be referred to the lymphedema clinic at [facility] The patient has had all questions answered and will be compliant Electronic Signature(s) Signed: 01/09/2017 9:38:48 AM By: Christin Fudge MD, FACS Entered By: Christin Fudge on 01/09/2017 09:38:48 Ryan Burgess ([id number]) -------------------------------------------------------------------------------- SuperBill Details Patient Name: Ryan Burgess. Date of Service: 01/09/2017 Medical Record Number: [id number] Patient Account Number: [id number] Date of Birth/Sex: Feb 08, 1971 (46 y.o. Male) Treating RN: Montey Hora Primary Care Provider: Donavan Burnet Other Clinician: Referring Provider: Donavan Burnet Treating Provider/Extender: Christin Fudge Service Line: Outpatient Weeks in Treatment: 1 Diagnosis Coding ICD-10 Codes Code Description I89.0 Lymphedema, not elsewhere classified L03.115 Cellulitis of right lower limb E66.01 Morbid (severe) obesity due to excess calories R73.03 Prediabetes Facility Procedures CPT4: Description Modifier Quantity Code [id number] BILATERAL: Application of multi-layer venous compression 1 system; leg (below knee), including ankle and foot. Physician Procedures CPT4 Code: [id number] Description: [id number] - WC PHYS LEVEL 3 - EST PT ICD-10 Description Diagnosis I89.0 Lymphedema, not elsewhere classified E66.01 Morbid (severe) obesity due to excess calori R73.03 Prediabetes Modifier: es Quantity: 1 Electronic Signature(s) Signed: 01/09/2017 5:12:11 PM By: Montey Hora Previous Signature: 01/09/2017 9:39:01 AM Version By: Christin Fudge MD, FACS Entered By: Montey Hora on 01/09/2017 17:12:11

## 2017-01-16 ENCOUNTER — Encounter (HOSPITAL_COMMUNITY): Payer: Self-pay | Admitting: General Practice

## 2017-01-16 ENCOUNTER — Inpatient Hospital Stay (HOSPITAL_COMMUNITY)
Admission: AD | Admit: 2017-01-16 | Discharge: 2017-01-19 | DRG: 603 | Disposition: A | Discharge status: Home Health | Payer: BLUE CROSS/BLUE SHIELD | Source: Ambulatory Visit | Attending: Internal Medicine | Admitting: Internal Medicine

## 2017-01-16 ENCOUNTER — Encounter: Payer: BLUE CROSS/BLUE SHIELD | Admitting: Surgery

## 2017-01-16 ENCOUNTER — Ambulatory Visit (INDEPENDENT_AMBULATORY_CARE_PROVIDER_SITE_OTHER): Payer: BLUE CROSS/BLUE SHIELD | Admitting: Adult Health

## 2017-01-16 ENCOUNTER — Encounter: Payer: Self-pay | Admitting: Adult Health

## 2017-01-16 VITALS — BP 146/80 | HR 67 | Ht 76.0 in | Wt >= 6400 oz

## 2017-01-16 DIAGNOSIS — R7303 Prediabetes: Secondary | ICD-10-CM

## 2017-01-16 DIAGNOSIS — D696 Thrombocytopenia, unspecified: Secondary | ICD-10-CM | POA: Diagnosis present

## 2017-01-16 DIAGNOSIS — Z79899 Other long term (current) drug therapy: Secondary | ICD-10-CM | POA: Diagnosis not present

## 2017-01-16 DIAGNOSIS — Z825 Family history of asthma and other chronic lower respiratory diseases: Secondary | ICD-10-CM

## 2017-01-16 DIAGNOSIS — I83019 Varicose veins of right lower extremity with ulcer of unspecified site: Secondary | ICD-10-CM

## 2017-01-16 DIAGNOSIS — S80821A Blister (nonthermal), right lower leg, initial encounter: Secondary | ICD-10-CM | POA: Diagnosis present

## 2017-01-16 DIAGNOSIS — I890 Lymphedema, not elsewhere classified: Secondary | ICD-10-CM | POA: Diagnosis present

## 2017-01-16 DIAGNOSIS — Z9989 Dependence on other enabling machines and devices: Secondary | ICD-10-CM

## 2017-01-16 DIAGNOSIS — I10 Essential (primary) hypertension: Secondary | ICD-10-CM | POA: Diagnosis present

## 2017-01-16 DIAGNOSIS — E871 Hypo-osmolality and hyponatremia: Secondary | ICD-10-CM

## 2017-01-16 DIAGNOSIS — Z6841 Body Mass Index (BMI) 40.0 and over, adult: Secondary | ICD-10-CM

## 2017-01-16 DIAGNOSIS — Z833 Family history of diabetes mellitus: Secondary | ICD-10-CM

## 2017-01-16 DIAGNOSIS — L03115 Cellulitis of right lower limb: Secondary | ICD-10-CM

## 2017-01-16 DIAGNOSIS — K219 Gastro-esophageal reflux disease without esophagitis: Secondary | ICD-10-CM | POA: Diagnosis present

## 2017-01-16 DIAGNOSIS — S80822A Blister (nonthermal), left lower leg, initial encounter: Secondary | ICD-10-CM | POA: Diagnosis present

## 2017-01-16 DIAGNOSIS — K7460 Unspecified cirrhosis of liver: Secondary | ICD-10-CM | POA: Diagnosis present

## 2017-01-16 DIAGNOSIS — E662 Morbid (severe) obesity with alveolar hypoventilation: Secondary | ICD-10-CM | POA: Diagnosis present

## 2017-01-16 DIAGNOSIS — L97919 Non-pressure chronic ulcer of unspecified part of right lower leg with unspecified severity: Secondary | ICD-10-CM

## 2017-01-16 DIAGNOSIS — L03116 Cellulitis of left lower limb: Secondary | ICD-10-CM

## 2017-01-16 DIAGNOSIS — Z7982 Long term (current) use of aspirin: Secondary | ICD-10-CM

## 2017-01-16 DIAGNOSIS — E7800 Pure hypercholesterolemia, unspecified: Secondary | ICD-10-CM

## 2017-01-16 DIAGNOSIS — Z8249 Family history of ischemic heart disease and other diseases of the circulatory system: Secondary | ICD-10-CM

## 2017-01-16 DIAGNOSIS — E785 Hyperlipidemia, unspecified: Secondary | ICD-10-CM | POA: Diagnosis not present

## 2017-01-16 DIAGNOSIS — E119 Type 2 diabetes mellitus without complications: Secondary | ICD-10-CM | POA: Diagnosis present

## 2017-01-16 DIAGNOSIS — Z806 Family history of leukemia: Secondary | ICD-10-CM

## 2017-01-16 DIAGNOSIS — F329 Major depressive disorder, single episode, unspecified: Secondary | ICD-10-CM | POA: Diagnosis not present

## 2017-01-16 DIAGNOSIS — G4733 Obstructive sleep apnea (adult) (pediatric): Secondary | ICD-10-CM | POA: Diagnosis not present

## 2017-01-16 DIAGNOSIS — Z820 Family history of epilepsy and other diseases of the nervous system: Secondary | ICD-10-CM | POA: Diagnosis not present

## 2017-01-16 DIAGNOSIS — G4730 Sleep apnea, unspecified: Secondary | ICD-10-CM | POA: Diagnosis not present

## 2017-01-16 DIAGNOSIS — K7469 Other cirrhosis of liver: Secondary | ICD-10-CM | POA: Diagnosis not present

## 2017-01-16 HISTORY — DX: Prediabetes: R73.03

## 2017-01-16 LAB — CBC WITH DIFFERENTIAL/PLATELET
BASOS PCT: 2 %
Basophils Absolute: 0.1 10*3/uL (ref 0.0–0.1)
EOS ABS: 0.4 10*3/uL (ref 0.0–0.7)
EOS PCT: 11 %
HCT: 34.9 % — ABNORMAL LOW (ref 39.0–52.0)
Hemoglobin: 11.9 g/dL — ABNORMAL LOW (ref 13.0–17.0)
Lymphocytes Relative: 44 %
Lymphs Abs: 1.6 10*3/uL (ref 0.7–4.0)
MCH: 32.5 pg (ref 26.0–34.0)
MCHC: 34.1 g/dL (ref 30.0–36.0)
MCV: 95.4 fL (ref 78.0–100.0)
MONO ABS: 0.5 10*3/uL (ref 0.1–1.0)
Monocytes Relative: 14 %
Neutro Abs: 1.1 10*3/uL — ABNORMAL LOW (ref 1.7–7.7)
Neutrophils Relative %: 30 %
PLATELETS: 83 10*3/uL — AB (ref 150–400)
RBC: 3.66 MIL/uL — AB (ref 4.22–5.81)
RDW: 16.8 % — AB (ref 11.5–15.5)
WBC: 3.7 10*3/uL — AB (ref 4.0–10.5)

## 2017-01-16 LAB — COMPREHENSIVE METABOLIC PANEL
ALT: 24 U/L (ref 17–63)
AST: 56 U/L — AB (ref 15–41)
Albumin: 2.2 g/dL — ABNORMAL LOW (ref 3.5–5.0)
Alkaline Phosphatase: 78 U/L (ref 38–126)
Anion gap: 6 (ref 5–15)
BILIRUBIN TOTAL: 1.5 mg/dL — AB (ref 0.3–1.2)
BUN: 13 mg/dL (ref 6–20)
CO2: 27 mmol/L (ref 22–32)
Calcium: 8.4 mg/dL — ABNORMAL LOW (ref 8.9–10.3)
Chloride: 106 mmol/L (ref 101–111)
Creatinine, Ser: 0.81 mg/dL (ref 0.61–1.24)
Glucose, Bld: 76 mg/dL (ref 65–99)
POTASSIUM: 3.9 mmol/L (ref 3.5–5.1)
Sodium: 139 mmol/L (ref 135–145)
TOTAL PROTEIN: 6.3 g/dL — AB (ref 6.5–8.1)

## 2017-01-16 LAB — SEDIMENTATION RATE: SED RATE: 33 mm/h — AB (ref 0–16)

## 2017-01-16 LAB — PROTIME-INR
INR: 1.54
PROTHROMBIN TIME: 18.6 s — AB (ref 11.4–15.2)

## 2017-01-16 LAB — C-REACTIVE PROTEIN

## 2017-01-16 MED ORDER — SERTRALINE HCL 50 MG PO TABS
50.0000 mg | ORAL_TABLET | Freq: Every day | ORAL | Status: DC
  Administered 2017-01-17 – 2017-01-19 (×3): 50 mg via ORAL
  Filled 2017-01-16 (×3): qty 1

## 2017-01-16 MED ORDER — RIFAXIMIN 550 MG PO TABS
550.0000 mg | ORAL_TABLET | Freq: Two times a day (BID) | ORAL | Status: DC
  Administered 2017-01-16 – 2017-01-19 (×6): 550 mg via ORAL
  Filled 2017-01-16 (×6): qty 1

## 2017-01-16 MED ORDER — LACTULOSE ENCEPHALOPATHY 10 GM/15ML PO SOLN: 30.0000 g | Freq: Three times a day (TID) | ORAL | Status: DC

## 2017-01-16 MED ORDER — FUROSEMIDE 40 MG PO TABS
60.0000 mg | ORAL_TABLET | Freq: Every day | ORAL | Status: DC
  Administered 2017-01-17 – 2017-01-19 (×3): 60 mg via ORAL
  Filled 2017-01-16 (×3): qty 1

## 2017-01-16 MED ORDER — LACTULOSE 10 GM/15ML PO SOLN
30.0000 g | Freq: Three times a day (TID) | ORAL | Status: DC
  Administered 2017-01-16 – 2017-01-19 (×6): 30 g via ORAL
  Filled 2017-01-16 (×7): qty 45

## 2017-01-16 MED ORDER — VANCOMYCIN HCL 10 G IV SOLR
2500.0000 mg | Freq: Once | INTRAVENOUS | Status: AC
  Administered 2017-01-16: 2500 mg via INTRAVENOUS
  Filled 2017-01-16: qty 2500

## 2017-01-16 MED ORDER — ASPIRIN EC 81 MG PO TBEC
81.0000 mg | DELAYED_RELEASE_TABLET | Freq: Every day | ORAL | Status: DC
  Administered 2017-01-16 – 2017-01-19 (×4): 81 mg via ORAL
  Filled 2017-01-16 (×4): qty 1

## 2017-01-16 MED ORDER — VANCOMYCIN HCL IN DEXTROSE 1-5 GM/200ML-% IV SOLN: 1000.0000 mg | Freq: Once | INTRAVENOUS | Status: DC

## 2017-01-16 MED ORDER — ATORVASTATIN CALCIUM 20 MG PO TABS
20.0000 mg | ORAL_TABLET | Freq: Every day | ORAL | Status: DC
  Administered 2017-01-16 – 2017-01-18 (×3): 20 mg via ORAL
  Filled 2017-01-16 (×3): qty 1

## 2017-01-16 MED ORDER — ASPIRIN 81 MG PO TBEC: 81.0000 mg | DELAYED_RELEASE_TABLET | Freq: Every day | ORAL | Status: DC

## 2017-01-16 MED ORDER — ADULT MULTIVITAMIN W/MINERALS CH
1.0000 | ORAL_TABLET | Freq: Every day | ORAL | Status: DC
  Administered 2017-01-17 – 2017-01-19 (×3): 1 via ORAL
  Filled 2017-01-16 (×3): qty 1

## 2017-01-16 MED ORDER — ACETAMINOPHEN 650 MG RE SUPP: 650.0000 mg | Freq: Four times a day (QID) | RECTAL | Status: DC | PRN

## 2017-01-16 MED ORDER — PANTOPRAZOLE SODIUM 40 MG PO TBEC
40.0000 mg | DELAYED_RELEASE_TABLET | Freq: Every day | ORAL | Status: DC
  Administered 2017-01-17 – 2017-01-19 (×3): 40 mg via ORAL
  Filled 2017-01-16 (×3): qty 1

## 2017-01-16 MED ORDER — PIPERACILLIN-TAZOBACTAM 3.375 G IVPB 30 MIN: 3.3750 g | Freq: Once | INTRAVENOUS | Status: DC

## 2017-01-16 MED ORDER — PIPERACILLIN-TAZOBACTAM 3.375 G IVPB
3.3750 g | Freq: Three times a day (TID) | INTRAVENOUS | Status: DC
  Administered 2017-01-16 – 2017-01-18 (×6): 3.375 g via INTRAVENOUS
  Filled 2017-01-16 (×7): qty 50

## 2017-01-16 MED ORDER — ACETAMINOPHEN 325 MG PO TABS
650.0000 mg | ORAL_TABLET | Freq: Four times a day (QID) | ORAL | Status: DC | PRN
  Administered 2017-01-16: 650 mg via ORAL
  Filled 2017-01-16: qty 2

## 2017-01-16 MED ORDER — VANCOMYCIN HCL 10 G IV SOLR
1500.0000 mg | Freq: Three times a day (TID) | INTRAVENOUS | Status: DC
  Administered 2017-01-17 – 2017-01-18 (×5): 1500 mg via INTRAVENOUS
  Filled 2017-01-16 (×6): qty 1500

## 2017-01-16 NOTE — Progress Notes (Signed)
Subjective:    Patient ID: Ryan Burgess, male    DOB: 1971-02-15, 46 y.o.   MRN: [id number]  HPI:  Mr. Fallin presents to establish as a new pt.  He is a pleasant, morbidly obese male.  Significant medical hx: HTN, BMI 62, OSA, hyperlipidemia, hepatic cirrhosis, and currently experiencing bil lower extremity cellulitis.   He has the Flu at the beginning of the year then developed cellulitis that required 5 day hospitalization.  He has home health wound care multiple times a week and also once weekly care visits at [facility].  He reports medication compliance and denies SE.  He reports "felling much better and more level" since starting the Sertaline.  He is interested in referral to Bariatric Clinic, after "legs are healed".    Patient Care Team    Relationship Specialty Notifications Start End  Odella Aquas, NP PCP - General Family Medicine  01/16/17     Patient Active Problem List   Diagnosis Date Noted  . Cellulitis of leg, left 01/16/2017  . Cellulitis of leg, right 12/23/2016  . AKI (acute kidney injury) ([place]) 12/19/2016  . Hyponatremia 12/19/2016  . Pressure injury of skin 12/19/2016  . Encephalopathy, hepatic ([place]) 11/20/2015  . Hepatic cirrhosis ([place]) 11/20/2015  . Thrombocytopenia ([place])   . Disorientation 11/19/2015  . Stroke or transient ischemic attack (TIA) diagnosed during current admission 11/19/2015  . OSA on CPAP 05/02/2015  . Hemorrhagic cystitis 01/29/2015  . Sepsis ([place]) 01/29/2015  . Flank pain 01/29/2015  . Sinus tachycardia 01/29/2015  . Lymphedema 01/29/2015  . Morbid obesity with body mass index of 50.0-59.9 in adult [facility]) 01/25/2015  . Obesity hypoventilation syndrome ([place]) 01/25/2015  . Snoring   . High cholesterol   . TIA (transient ischemic attack) 01/10/2015  . Morbid obesity ([place]) 01/10/2015  . Encephalopathy acute 01/09/2015  . HTN (hypertension) 01/09/2015  . Borderline diabetic 01/09/2015  . GERD (gastroesophageal reflux disease)  01/09/2015  . Acute encephalopathy      Past Medical History:  Diagnosis Date  . Cirrhosis, nonalcoholic ([place])   . GERD (gastroesophageal reflux disease)   . Headache    "maybe monthly" (12/19/2016)  . Hepatic encephalopathy ([place]) ~ 2016 X 2   "first time they thought it was a TIA; dx'd hepatic encephalopathy the 2nd time it happened"  . High cholesterol   . Hypertension   . Kidney stones 01/2015   UTI  . Migraine    "none in years; used to have them frequently" (12/19/2016)  . Obesity   . OSA on CPAP   . Snoring   . Type II diabetes mellitus ([place])      Past Surgical History:  Procedure Laterality Date  . ESOPHAGOGASTRODUODENOSCOPY (EGD) WITH PROPOFOL N/A 07/26/2015   Procedure: ESOPHAGOGASTRODUODENOSCOPY (EGD) WITH PROPOFOL;  Surgeon: Arta Silence, MD;  Location: [facility] ENDOSCOPY;  Service: Endoscopy;  Laterality: N/A;  . TEE WITHOUT CARDIOVERSION N/A 04/03/2015   Procedure: TRANSESOPHAGEAL ECHOCARDIOGRAM (TEE);  Surgeon: Adrian Prows, MD;  Location: [facility] ENDOSCOPY;  Service: Cardiovascular;  Laterality: N/A;  . WISDOM TOOTH EXTRACTION  1990     Family History  Problem Relation Age of Onset  . Leukemia Mother   . Diabetes Father   . Heart disease Maternal Grandmother   . Heart disease Paternal Grandmother   . Dementia Paternal Grandmother   . Diabetes Paternal Grandmother   . Frontotemporal dementia Paternal Grandmother   . COPD Maternal Grandfather   . Heart disease Paternal Grandfather   .  Diabetes Paternal Grandfather      History  Drug Use No     History  Alcohol Use  . 0.6 - 1.2 oz/week  . 1 - 2 Standard drinks or equivalent per week    Comment: once a year     History  Smoking Status  . Never Smoker  Smokeless Tobacco  . Never Used     Outpatient Encounter Prescriptions as of 01/16/2017  Medication Sig  . acetaminophen (TYLENOL) 500 MG tablet Take 1,000 mg by mouth every 6 (six) hours as needed for headache.  Marland Kitchen aspirin EC 81 MG EC tablet Take 1  tablet (81 mg total) by mouth daily.  Marland Kitchen atorvastatin (LIPITOR) 20 MG tablet Take 1 tablet (20 mg total) by mouth at bedtime.  . blood glucose meter kit and supplies KIT Dispense based on patient and insurance preference. Use up to four times daily as directed. (FOR ICD-9 250.00, 250.01).  Marland Kitchen dexlansoprazole (DEXILANT) 60 MG capsule Take 60 mg by mouth daily.  . furosemide (LASIX) 40 MG tablet Take 40 mg by mouth daily.  Marland Kitchen lactulose, encephalopathy, (CHRONULAC) 10 GM/15ML SOLN Take 45 mls by mouth 3 times a day  . loratadine (CLARITIN) 10 MG tablet Take 10 mg by mouth daily as needed for allergies.  . Multiple Vitamin (MULTIVITAMIN WITH MINERALS) TABS tablet Take 1 tablet by mouth daily.  . phenylephrine (SUDAFED PE) 10 MG TABS tablet Take 10 mg by mouth every 4 (four) hours as needed (for congestion).   . rifaximin (XIFAXAN) 550 MG TABS tablet Take 1 tablet (550 mg total) by mouth 2 (two) times daily.  . [DISCONTINUED] furosemide (LASIX) 20 MG tablet Take 1 tablet (20 mg total) by mouth daily.  . sertraline (ZOLOFT) 50 MG tablet Take 1 tablet by mouth daily.  . [DISCONTINUED] saccharomyces boulardii (FLORASTOR) 250 MG capsule Take 1 capsule (250 mg total) by mouth 2 (two) times daily.   No facility-administered encounter medications on file as of 01/16/2017.     Allergies: Patient has no known allergies.  Body mass index is 62.33 kg/m.  Blood pressure (!) 146/80, pulse 67, height '6\' 4"'$  (1.93 m), weight (!) 512 lb 1.6 oz (232.3 kg).   Review of Systems  Constitutional: Positive for fatigue. Negative for activity change, appetite change, chills, diaphoresis, fever and unexpected weight change.  HENT: Negative for congestion.   Eyes: Negative for visual disturbance.  Respiratory: Negative for cough and shortness of breath.   Cardiovascular: Positive for leg swelling. Negative for chest pain and palpitations.  Gastrointestinal: Negative for abdominal distention, abdominal pain,  constipation, diarrhea, nausea and vomiting.  Endocrine: Negative for cold intolerance, heat intolerance, polydipsia, polyphagia and polyuria.  Genitourinary: Negative for difficulty urinating and flank pain.  Musculoskeletal: Positive for arthralgias, back pain, gait problem, joint swelling and myalgias. Negative for neck pain and neck stiffness.  Skin: Positive for wound. Negative for color change, pallor and rash.       Bil lower ext cellulitis, he denies any other areas of skin breakdown.  Neurological: Negative for dizziness, tremors and weakness.  Hematological: Negative for adenopathy. Does not bruise/bleed easily.  Psychiatric/Behavioral: Negative for agitation, behavioral problems, confusion, self-injury, sleep disturbance and suicidal ideas. The patient is not nervous/anxious.        Objective:   Physical Exam  Constitutional: He is oriented to person, place, and time. He appears well-developed. No distress.  Morbidly obese.  HENT:  Head: Normocephalic and atraumatic.  Right Ear: External ear normal.  Left  Ear: External ear normal.  Eyes: Conjunctivae and EOM are normal. Pupils are equal, round, and reactive to light.  Neck: Normal range of motion. Neck supple.  Cardiovascular: Normal rate, regular rhythm, normal heart sounds and intact distal pulses.   No murmur heard. Pulmonary/Chest: Effort normal and breath sounds normal. No respiratory distress. He has no wheezes. He has no rales. He exhibits no tenderness.  Abdominal: Soft. Bowel sounds are normal. He exhibits no distension.  Musculoskeletal: He exhibits edema and tenderness.  Lymphadenopathy:    He has no cervical adenopathy.  Neurological: He is alert and oriented to person, place, and time. He has normal reflexes.  Skin: Skin is warm and dry. No rash noted. He is not diaphoretic. No erythema. No pallor.  Bil lower extremity edema/cellulits.  Both legs wrapped and he has appt with wound care today, therefore I could  not visualize skin.   Psychiatric: He has a normal mood and affect. His behavior is normal. Judgment and thought content normal.  Nursing note and vitals reviewed.         Assessment & Plan:   1. Essential hypertension   2. Hyponatremia   3. Thrombocytopenia ([place])   4. High cholesterol   5. Borderline diabetic   6. Other cirrhosis of liver ([place])   7. Morbid obesity with body mass index of 50.0-59.9 in adult ([place])   8. Cellulitis of leg, right   9. Cellulitis of leg, left     HTN (hypertension) Continue Furosemide 40 mg daily. Reduce sodium and increase walking.   Borderline diabetic Increase walking and reduce sugar/CHO intake.   High cholesterol Continue Atorvastatin 20 mg and reduce saturated fat intake. Fasting labs obtained today.  Morbid obesity with body mass index of 50.0-59.9 in adult [facility]) Discussed at length the importance of reducing weight/body habitus. He is extreme of MI/CVA due to BMI 62 Once bil cellulitis resolves, will place referral for Bariatric Clinic.  Cellulitis of leg, right Continue with home health wound care and weekly visits to [facility].   Cellulitis of leg, left Continue with home health wound care and weekly visits to [facility].     FOLLOW-UP:  Return in about 3 months (around 04/15/2017) for Regular Follow Up.

## 2017-01-16 NOTE — Assessment & Plan Note (Signed)
Discussed at length the importance of reducing weight/body habitus. He is extreme of MI/CVA due to BMI 62 Once bil cellulitis resolves, will place referral for Bariatric Clinic.

## 2017-01-16 NOTE — Patient Instructions (Addendum)
Obesity, Adult Obesity is the condition of having too much total body fat. Being overweight or obese means that your weight is greater than what is considered healthy for your body size. Obesity is determined by a measurement called BMI. BMI is an estimate of body fat and is calculated from height and weight. For adults, a BMI of 30 or higher is considered obese. Obesity can eventually lead to other health concerns and major illnesses, including:  Stroke.  Coronary artery disease (CAD).  Type 2 diabetes.  Some types of cancer, including cancers of the colon, breast, uterus, and gallbladder.  Osteoarthritis.  High blood pressure (hypertension).  High cholesterol.  Sleep apnea.  Gallbladder stones.  Infertility problems. What are the causes? The main cause of obesity is taking in (consuming) more calories than your body uses for energy. Other factors that contribute to this condition may include:  Being born with genes that make you more likely to become obese.  Having a medical condition that causes obesity. These conditions include:  Hypothyroidism.  Polycystic ovarian syndrome (PCOS).  Binge-eating disorder.  Cushing syndrome.  Taking certain medicines, such as steroids, antidepressants, and seizure medicines.  Not being physically active (sedentary lifestyle).  Living where there are limited places to exercise safely or buy healthy foods.  Not getting enough sleep. What increases the risk? The following factors may increase your risk of this condition:  Having a family history of obesity.  Being a woman of African-American descent.  Being a man of Hispanic descent. What are the signs or symptoms? Having excessive body fat is the main symptom of this condition. How is this diagnosed? This condition may be diagnosed based on:  Your symptoms.  Your medical history.  A physical exam. Your health care provider may measure:  Your BMI. If you are an adult  with a BMI between 25 and less than 30, you are considered overweight. If you are an adult with a BMI of 30 or higher, you are considered obese.  The distances around your hips and your waist (circumferences). These may be compared to each other to help diagnose your condition.  Your skinfold thickness. Your health care provider may gently pinch a fold of your skin and measure it. How is this treated? Treatment for this condition often includes changing your lifestyle. Treatment may include some or all of the following:  Dietary changes. Work with your health care provider and a dietitian to set a weight-loss goal that is healthy and reasonable for you. Dietary changes may include eating:  Smaller portions. A portion size is the amount of a particular food that is healthy for you to eat at one time. This varies from person to person.  Low-calorie or low-fat options.  More whole grains, fruits, and vegetables.  Regular physical activity. This may include aerobic activity (cardio) and strength training.  Medicine to help you lose weight. Your health care provider may prescribe medicine if you are unable to lose 1 pound a week after 6 weeks of eating more healthily and doing more physical activity.  Surgery. Surgical options may include gastric banding and gastric bypass. Surgery may be done if:  Other treatments have not helped to improve your condition.  You have a BMI of 40 or higher.  You have life-threatening health problems related to obesity. Follow these instructions at home:   Eating and drinking  Follow recommendations from your health care provider about what you eat and drink. Your health care provider may advise you  to:  Limit fast foods, sweets, and processed snack foods.  Choose low-fat options, such as low-fat milk instead of whole milk.  Eat 5 or more servings of fruits or vegetables every day.  Eat at home more often. This gives you more control over what you  eat.  Choose healthy foods when you eat out.  Learn what a healthy portion size is.  Keep low-fat snacks on hand.  Avoid sugary drinks, such as soda, fruit juice, iced tea sweetened with sugar, and flavored milk.  Eat a healthy breakfast.  Drink enough water to keep your urine clear or pale yellow.  Do not go without eating for long periods of time (do not fast) or follow a fad diet. Fasting and fad diets can be unhealthy and even dangerous. Physical Activity  Exercise regularly, as told by your health care provider. Ask your health care provider what types of exercise are safe for you and how often you should exercise.  Warm up and stretch before being active.  Cool down and stretch after being active.  Rest between periods of activity. Lifestyle  Limit the time that you spend in front of your TV, computer, or video game system.  Find ways to reward yourself that do not involve food.  Limit alcohol intake to no more than 1 drink a day for nonpregnant women and 2 drinks a day for men. One drink equals 12 oz of beer, 5 oz of wine, or 1 oz of hard liquor. General instructions  Keep a weight loss journal to keep track of the food you eat and how much you exercise you get.  Take over-the-counter and prescription medicines only as told by your health care provider.  Take vitamins and supplements only as told by your health care provider.  Consider joining a support group. Your health care provider may be able to recommend a support group.  Keep all follow-up visits as told by your health care provider. This is important. Contact a health care provider if:  You are unable to meet your weight loss goal after 6 weeks of dietary and lifestyle changes. This information is not intended to replace advice given to you by your health care provider. Make sure you discuss any questions you have with your health care provider. Document Released: 12/26/2004 Document Revised: 04/22/2016  Document Reviewed: 09/06/2015 Elsevier Interactive Patient Education  2017 [place].   Heart-Healthy Eating Plan Many factors influence your heart health, including eating and exercise habits. Heart (coronary) risk increases with abnormal blood fat (lipid) levels. Heart-healthy meal planning includes limiting unhealthy fats, increasing healthy fats, and making other small dietary changes. This includes maintaining a healthy body weight to help keep lipid levels within a normal range. What is my plan? Your health care provider recommends that you:  Get no more than _________% of the total calories in your daily diet from fat.  Limit your intake of saturated fat to less than _________% of your total calories each day.  Limit the amount of cholesterol in your diet to less than _________ mg per day. What types of fat should I choose?  Choose healthy fats more often. Choose monounsaturated and polyunsaturated fats, such as olive oil and canola oil, flaxseeds, walnuts, almonds, and seeds.  Eat more omega-3 fats. Good choices include salmon, mackerel, sardines, tuna, flaxseed oil, and ground flaxseeds. Aim to eat fish at least two times each week.  Limit saturated fats. Saturated fats are primarily found in animal products, such as meats,  butter, and cream. Plant sources of saturated fats include palm oil, palm kernel oil, and coconut oil.  Avoid foods with partially hydrogenated oils in them. These contain trans fats. Examples of foods that contain trans fats are stick margarine, some tub margarines, cookies, crackers, and other baked goods. What general guidelines do I need to follow?  Check food labels carefully to identify foods with trans fats or high amounts of saturated fat.  Fill one half of your plate with vegetables and green salads. Eat 4-5 servings of vegetables per day. A serving of vegetables equals 1 cup of raw leafy vegetables,  cup of raw or cooked cut-up vegetables, or   cup of vegetable juice.  Fill one fourth of your plate with whole grains. Look for the word "whole" as the first word in the ingredient list.  Fill one fourth of your plate with lean protein foods.  Eat 4-5 servings of fruit per day. A serving of fruit equals one medium whole fruit,  cup of dried fruit,  cup of fresh, frozen, or canned fruit, or  cup of 100% fruit juice.  Eat more foods that contain soluble fiber. Examples of foods that contain this type of fiber are apples, broccoli, carrots, beans, peas, and barley. Aim to get 20-30 g of fiber per day.  Eat more home-cooked food and less restaurant, buffet, and fast food.  Limit or avoid alcohol.  Limit foods that are high in starch and sugar.  Avoid fried foods.  Cook foods by using methods other than frying. Baking, boiling, grilling, and broiling are all great options. Other fat-reducing suggestions include:  Removing the skin from poultry.  Removing all visible fats from meats.  Skimming the fat off of stews, soups, and gravies before serving them.  Steaming vegetables in water or broth.  Lose weight if you are overweight. Losing just 5-10% of your initial body weight can help your overall health and prevent diseases such as diabetes and heart disease.  Increase your consumption of nuts, legumes, and seeds to 4-5 servings per week. One serving of dried beans or legumes equals  cup after being cooked, one serving of nuts equals 1 ounces, and one serving of seeds equals  ounce or 1 tablespoon.  You may need to monitor your salt (sodium) intake, especially if you have high blood pressure. Talk with your health care provider or dietitian to get more information about reducing sodium. What foods can I eat? Grains  Breads, including [place], white, pita, wheat, raisin, rye, oatmeal, and [place]. Tortillas that are neither fried nor made with lard or trans fat. Low-fat rolls, including hotdog and hamburger buns and English  muffins. Biscuits. Muffins. Waffles. Pancakes. Light popcorn. Whole-grain cereals. Flatbread. Melba toast. Pretzels. Breadsticks. Rusks. Low-fat snacks and crackers, including oyster, saltine, matzo, graham, animal, and rye. Rice and pasta, including brown rice and those that are made with whole wheat. Vegetables  All vegetables. Fruits  All fruits, but limit coconut. Meats and Other Protein Sources  Lean, well-trimmed beef, veal, pork, and lamb. Chicken and [place] without skin. All fish and shellfish. Wild duck, rabbit, pheasant, and venison. Egg whites or low-cholesterol egg substitutes. Dried beans, peas, lentils, and tofu.Seeds and most nuts. Dairy  Low-fat or nonfat cheeses, including ricotta, string, and mozzarella. Skim or 1% milk that is liquid, powdered, or evaporated. Buttermilk that is made with low-fat milk. Nonfat or low-fat yogurt. Beverages  Mineral water. Diet carbonated beverages. Sweets and Desserts  Sherbets and fruit ices. Honey, jam,  marmalade, jelly, and syrups. Meringues and gelatins. Pure sugar candy, such as hard candy, jelly beans, gumdrops, mints, marshmallows, and small amounts of dark chocolate. [employer] Eat all sweets and desserts in moderation. Fats and Oils  Nonhydrogenated (trans-free) margarines. Vegetable oils, including soybean, sesame, sunflower, olive, peanut, safflower, corn, canola, and cottonseed. Salad dressings or mayonnaise that are made with a vegetable oil. Limit added fats and oils that you use for cooking, baking, salads, and as spreads. Other  Cocoa powder. Coffee and tea. All seasonings and condiments. The items listed above may not be a complete list of recommended foods or beverages. Contact your dietitian for more options.  What foods are not recommended? Grains  Breads that are made with saturated or trans fats, oils, or whole milk. Croissants. Butter rolls. Cheese breads. Sweet rolls. Donuts. Buttered popcorn. Chow mein noodles.  High-fat crackers, such as cheese or butter crackers. Meats and Other Protein Sources  Fatty meats, such as hotdogs, short ribs, sausage, spareribs, bacon, ribeye roast or steak, and mutton. High-fat deli meats, such as salami and bologna. Caviar. Domestic duck and goose. Organ meats, such as kidney, liver, sweetbreads, brains, gizzard, chitterlings, and heart. Dairy  Cream, sour cream, cream cheese, and creamed cottage cheese. Whole milk cheeses, including blue (bleu), Monterey Jack, [place], [place], American, [place], Swiss, [place], [place], and [place]. Whole or 2% milk that is liquid, evaporated, or condensed. Whole buttermilk. Cream sauce or high-fat cheese sauce. Yogurt that is made from whole milk. Beverages  Regular sodas and drinks with added sugar. Sweets and Desserts  Frosting. Pudding. Cookies. Cakes other than angel food cake. Candy that has milk chocolate or white chocolate, hydrogenated fat, butter, coconut, or unknown ingredients. Buttered syrups. Full-fat ice cream or ice cream drinks. Fats and Oils  Gravy that has suet, meat fat, or shortening. Cocoa butter, hydrogenated oils, palm oil, coconut oil, palm kernel oil. These can often be found in baked products, candy, fried foods, nondairy creamers, and whipped toppings. Solid fats and shortenings, including bacon fat, salt pork, lard, and butter. Nondairy cream substitutes, such as coffee creamers and sour cream substitutes. Salad dressings that are made of unknown oils, cheese, or sour cream. The items listed above may not be a complete list of foods and beverages to avoid. Contact your dietitian for more information.  This information is not intended to replace advice given to you by your health care provider. Make sure you discuss any questions you have with your health care provider. Document Released: 08/27/2008 Document Revised: 06/07/2016 Document Reviewed: 05/12/2014 Elsevier Interactive Patient Education  2017 [place] all medications as directed. Continue wound care as directed. Will call when labs result. Please follow-up in 3 months or sooner if needed.

## 2017-01-16 NOTE — Assessment & Plan Note (Signed)
Continue Furosemide 40 mg daily. Reduce sodium and increase walking.

## 2017-01-16 NOTE — H&P (Addendum)
[facility] History and Physical   Patient: Ryan Burgess EVO:[id number]   PCP: Lillard Anes, NP DOB: 08-18-1971   DOA: 01/16/2017   DOS: 01/16/2017   DOS: the patient was seen and examined on 01/16/2017  Patient coming from: The patient is coming from home.  Chief Complaint: redness of right leg  HPI: Ryan Burgess is a 46 y.o. male with Past medical history of morbid obesity, chronic lymphedema and hepatic cirrhosis, hypertension, Recurrent cellulitis. Presented to wound care center as an outpatient for routine follow-up. He reportedly for last 3 days patient has noted redevelopment of blisters on the right leg with clear, foul-smelling discharge when the rupture. This was associated with increasing redness of the leg. He denies having any fever or chills no nausea no vomiting no leg cramps chest pain and abdominal pain. No rash anywhere else. They also noted a small blister on the left leg. Given this finding the wound care recommended to the patient to be admitted in the hospital for IV antibiotics and the patient was accepted for direct admission.  At his baseline ambulates without any support And is independent for most of his ADL; manages his medication on his own.  Review of Systems: as mentioned in the history of present illness.  All other systems reviewed and are negative.  Past Medical History:  Diagnosis Date  . Cirrhosis, nonalcoholic ([place])   . GERD (gastroesophageal reflux disease)   . Headache    "maybe monthly" (12/19/2016)  . Hepatic encephalopathy ([place]) ~ 2016 X 2   "first time they thought it was a TIA; dx'd hepatic encephalopathy the 2nd time it happened"  . High cholesterol   . Hypertension   . Kidney stones 01/2015   UTI  . Migraine    "none in years; used to have them frequently" (12/19/2016)  . Obesity   . OSA on CPAP   . Snoring   . Type II diabetes mellitus ([place])    Past Surgical History:  Procedure Laterality Date  .  ESOPHAGOGASTRODUODENOSCOPY (EGD) WITH PROPOFOL N/A 07/26/2015   Procedure: ESOPHAGOGASTRODUODENOSCOPY (EGD) WITH PROPOFOL;  Surgeon: Arta Silence, MD;  Location: [facility];  Service: Endoscopy;  Laterality: N/A;  . TEE WITHOUT CARDIOVERSION N/A 04/03/2015   Procedure: TRANSESOPHAGEAL ECHOCARDIOGRAM (TEE);  Surgeon: Adrian Prows, MD;  Location: [facility];  Service: Cardiovascular;  Laterality: N/A;  . [place] EXTRACTION  1990   Social History:  reports that he has never smoked. He has never used smokeless tobacco. He reports that he drinks about 0.6 - 1.2 oz of alcohol per week . He reports that he does not use drugs.  No Known Allergies   Family History  Problem Relation Age of Onset  . Leukemia Mother   . Diabetes Father   . Heart disease Maternal Grandmother   . Heart disease Paternal Grandmother   . Dementia Paternal Grandmother   . Diabetes Paternal Grandmother   . Frontotemporal dementia Paternal Grandmother   . COPD Maternal Grandfather   . Heart disease Paternal Grandfather   . Diabetes Paternal Grandfather      Prior to Admission medications   Medication Sig Start Date End Date Taking? Authorizing Provider  acetaminophen (TYLENOL) 500 MG tablet Take 1,000 mg by mouth every 6 (six) hours as needed for headache.    Historical Provider, MD  aspirin EC 81 MG EC tablet Take 1 tablet (81 mg total) by mouth daily. 01/10/15   Ripudeep Krystal Eaton, MD  atorvastatin (LIPITOR) 20 MG tablet  Take 1 tablet (20 mg total) by mouth at bedtime. 01/10/15   Ripudeep Krystal Eaton, MD  blood glucose meter kit and supplies KIT Dispense based on patient and insurance preference. Use up to four times daily as directed. (FOR ICD-9 250.00, 250.01). 01/11/15   Ripudeep Krystal Eaton, MD  dexlansoprazole (DEXILANT) 60 MG capsule Take 60 mg by mouth daily.    Historical Provider, MD  furosemide (LASIX) 40 MG tablet Take 40 mg by mouth daily.    Historical Provider, MD  lactulose, encephalopathy, (CHRONULAC) 10 GM/15ML SOLN  Take 45 mls by mouth 3 times a day 04/24/16   Historical Provider, MD  loratadine (CLARITIN) 10 MG tablet Take 10 mg by mouth daily as needed for allergies.    Historical Provider, MD  Multiple Vitamin (MULTIVITAMIN WITH MINERALS) TABS tablet Take 1 tablet by mouth daily.    Historical Provider, MD  phenylephrine (SUDAFED PE) 10 MG TABS tablet Take 10 mg by mouth every 4 (four) hours as needed (for congestion).     Historical Provider, MD  rifaximin (XIFAXAN) 550 MG TABS tablet Take 1 tablet (550 mg total) by mouth 2 (two) times daily. 06/14/16   Geronimo Boot, MD  sertraline (ZOLOFT) 50 MG tablet Take 1 tablet by mouth daily. 12/27/16   Historical Provider, MD    Physical Exam: Vitals:   01/16/17 1658 01/16/17 1659  BP: (!) 161/56   Pulse: 73   Resp: 20   Temp: 98.1 F (36.7 C)   TempSrc: Oral   SpO2: 100%   Weight:  (!) 232.3 kg (512 lb 1.6 oz)  Height:  _0  (1.93 m)    General: Alert, Awake and Oriented to Time, Place and Person. Appear in mild distress, affect appropriate Eyes: PERRL, Conjunctiva normal ENT: Oral Mucosa clear moist. Neck: difficult to assess JVD, no Abnormal Mass Or lumps Cardiovascular: S1 and S2 Present, aortic systolic Murmur, Peripheral Pulses Present Respiratory: Bilateral Air entry equal and Decreased, no use of accessory muscle, Clear to Auscultation, no Crackles, no wheezes Abdomen: Bowel Sound present, Soft and no tenderness Skin: right leg redness with warmth, no tendernedd, no other Rash, no induration Extremities: bilateral Pedal edema, no calf tenderness Neurologic: Grossly no focal neuro deficit. Bilaterally Equal motor strength  Labs pending on Admission, as pt is direct transfer.   Assessment/Plan 1. Cellulitis of leg, right Will check routine labs and esr crp. Pt does not appear septic at present. Treat with broad spectrum Antibiotics at present. Monitor response Wound care consult  2.   Hepatic cirrhosis (HCC) Continue home regimen  of lactulose and rifaximin  Will increase lasix to 60 mg as it appears that he has been having weeping blisters that leads to recurrent cellulitis Can not tolerate aldactone Appears stable at present  3. OSA Continue home CPAP   4.chronic thrombocytopenia Due to hepatic cirrhosis Avoiding pharmacological DVT prophylaxis Continue aspirin  Nutrition: cardiac diet DVT Prophylaxis: mechanical compression device  Advance goals of care discussion: full code   Consults: none  Family Communication: family was present at bedside, at the time of interview.  Opportunity was given to ask question and all questions were answered satisfactorily.  Disposition: pt has already been accepted for admission as inpatient, med-surge unit. Likely to be discharged home, in 2 days.  Author: Berle Mull, MD [facility] Pager: [phone] 01/16/2017  If 7PM-7AM, please contact night-coverage [email or web address] Password [email or web address]

## 2017-01-16 NOTE — Progress Notes (Signed)
Pharmacy Antibiotic Note  Ryan Burgess is a 46 y.o. male admitted on 01/16/2017 with cellulitis.  Pharmacy has been consulted for vancomycin and zosyn dosing.  Will load patient with vancomycin 2500mg  IV once.  Plan: Vancomycin 1500mg  IV every 8 hours.  Goal trough 10-15 mcg/mL. Zosyn 3.375g IV q8h (4 hour infusion).  Monitor culture data, renal function and clinical course VT at SS prn  Height: 6\' 4"  (193 cm) Weight: (!) 512 lb 1.6 oz (232.3 kg) IBW/kg (Calculated) : 86.8  Temp (24hrs), Avg:98.1 F (36.7 C), Min:98.1 F (36.7 C), Max:98.1 F (36.7 C)  No results for input(s): WBC, CREATININE, LATICACIDVEN, VANCOTROUGH, VANCOPEAK, VANCORANDOM, GENTTROUGH, GENTPEAK, GENTRANDOM, TOBRATROUGH, TOBRAPEAK, TOBRARND, AMIKACINPEAK, AMIKACINTROU, AMIKACIN in the last 168 hours.  CrCl cannot be calculated (Patient's most recent lab result is older than the maximum 21 days allowed.).    No Known Allergies   Andrey Cota. Diona Foley, PharmD, BCPS Clinical Pharmacist [phone] 01/16/2017 5:55 PM

## 2017-01-16 NOTE — Assessment & Plan Note (Signed)
Continue with home health wound care and weekly visits to [facility]

## 2017-01-16 NOTE — Assessment & Plan Note (Signed)
Continue Atorvastatin 20 mg and reduce saturated fat intake. Fasting labs obtained today.

## 2017-01-16 NOTE — Assessment & Plan Note (Signed)
Increase walking and reduce sugar/CHO intake.

## 2017-01-16 NOTE — Assessment & Plan Note (Signed)
Continue with home health wound care and weekly visits to [facility].

## 2017-01-17 DIAGNOSIS — Z9989 Dependence on other enabling machines and devices: Secondary | ICD-10-CM

## 2017-01-17 DIAGNOSIS — I10 Essential (primary) hypertension: Secondary | ICD-10-CM

## 2017-01-17 DIAGNOSIS — G4733 Obstructive sleep apnea (adult) (pediatric): Secondary | ICD-10-CM

## 2017-01-17 DIAGNOSIS — K7469 Other cirrhosis of liver: Secondary | ICD-10-CM

## 2017-01-17 LAB — COMPREHENSIVE METABOLIC PANEL
A/G RATIO: 0.7 — AB (ref 1.2–2.2)
ALBUMIN: 2.7 g/dL — AB (ref 3.5–5.5)
ALT: 24 IU/L (ref 0–44)
AST: 55 IU/L — ABNORMAL HIGH (ref 0–40)
Alkaline Phosphatase: 91 IU/L (ref 39–117)
BUN/Creatinine Ratio: 20 (ref 9–20)
BUN: 16 mg/dL (ref 6–24)
Bilirubin Total: 1.4 mg/dL — ABNORMAL HIGH (ref 0.0–1.2)
CALCIUM: 8.1 mg/dL — AB (ref 8.7–10.2)
CO2: 26 mmol/L (ref 18–29)
Chloride: 104 mmol/L (ref 96–106)
Creatinine, Ser: 0.82 mg/dL (ref 0.76–1.27)
GFR calc non Af Amer: 107 mL/min/{1.73_m2} (ref >59)
GFR, EST AFRICAN AMERICAN: 123 mL/min/{1.73_m2} (ref >59)
GLOBULIN, TOTAL: 3.9 g/dL (ref 1.5–4.5)
Glucose: 79 mg/dL (ref 65–99)
POTASSIUM: 4.2 mmol/L (ref 3.5–5.2)
Sodium: 142 mmol/L (ref 134–144)
TOTAL PROTEIN: 6.6 g/dL (ref 6.0–8.5)

## 2017-01-17 LAB — BASIC METABOLIC PANEL
Anion gap: 4 — ABNORMAL LOW (ref 5–15)
BUN: 12 mg/dL (ref 6–20)
CHLORIDE: 109 mmol/L (ref 101–111)
CO2: 25 mmol/L (ref 22–32)
Calcium: 7.9 mg/dL — ABNORMAL LOW (ref 8.9–10.3)
Creatinine, Ser: 0.7 mg/dL (ref 0.61–1.24)
GFR calc Af Amer: >60 mL/min (ref >60)
GFR calc non Af Amer: >60 mL/min (ref >60)
GLUCOSE: 82 mg/dL (ref 65–99)
POTASSIUM: 3.5 mmol/L (ref 3.5–5.1)
SODIUM: 138 mmol/L (ref 135–145)

## 2017-01-17 LAB — LIPID PANEL
CHOLESTEROL TOTAL: 91 mg/dL — AB (ref 100–199)
Chol/HDL Ratio: 2.4 ratio units (ref 0.0–5.0)
HDL: 38 mg/dL — ABNORMAL LOW (ref >39)
LDL CALC: 42 mg/dL (ref 0–99)
Triglycerides: 55 mg/dL (ref 0–149)
VLDL CHOLESTEROL CAL: 11 mg/dL (ref 5–40)

## 2017-01-17 LAB — CBC
HEMATOCRIT: 31.8 % — AB (ref 39.0–52.0)
HEMOGLOBIN: 10.9 g/dL — AB (ref 13.0–17.0)
MCH: 32.5 pg (ref 26.0–34.0)
MCHC: 34.3 g/dL (ref 30.0–36.0)
MCV: 94.9 fL (ref 78.0–100.0)
Platelets: 74 10*3/uL — ABNORMAL LOW (ref 150–400)
RBC: 3.35 MIL/uL — AB (ref 4.22–5.81)
RDW: 16.6 % — ABNORMAL HIGH (ref 11.5–15.5)
WBC: 2.8 10*3/uL — ABNORMAL LOW (ref 4.0–10.5)

## 2017-01-17 LAB — CBC WITH DIFFERENTIAL/PLATELET
BASOS: 1 %
Basophils Absolute: 0.1 10*3/uL (ref 0.0–0.2)
EOS (ABSOLUTE): 0.7 10*3/uL — ABNORMAL HIGH (ref 0.0–0.4)
EOS: 17 %
HEMATOCRIT: 38.2 % (ref 37.5–51.0)
HEMOGLOBIN: 12.7 g/dL — AB (ref 13.0–17.7)
IMMATURE GRANS (ABS): 0 10*3/uL (ref 0.0–0.1)
IMMATURE GRANULOCYTES: 0 %
LYMPHS: 39 %
Lymphocytes Absolute: 1.6 10*3/uL (ref 0.7–3.1)
MCH: 32.2 pg (ref 26.6–33.0)
MCHC: 33.2 g/dL (ref 31.5–35.7)
MCV: 97 fL (ref 79–97)
MONOCYTES: 16 %
Monocytes Absolute: 0.7 10*3/uL (ref 0.1–0.9)
NEUTROS PCT: 27 %
Neutrophils Absolute: 1.1 10*3/uL — ABNORMAL LOW (ref 1.4–7.0)
PLATELETS: 105 10*3/uL — AB (ref 150–379)
RBC: 3.94 x10E6/uL — ABNORMAL LOW (ref 4.14–5.80)
RDW: 16.7 % — ABNORMAL HIGH (ref 12.3–15.4)
WBC: 4.2 10*3/uL (ref 3.4–10.8)

## 2017-01-17 LAB — TSH: TSH: 3.82 u[IU]/mL (ref 0.450–4.500)

## 2017-01-17 LAB — VITAMIN D 25 HYDROXY (VIT D DEFICIENCY, FRACTURES): Vit D, 25-Hydroxy: 19.7 ng/mL — ABNORMAL LOW (ref 30.0–100.0)

## 2017-01-17 LAB — MAGNESIUM: MAGNESIUM: 1.7 mg/dL (ref 1.7–2.4)

## 2017-01-17 LAB — HEMOGLOBIN A1C
Est. average glucose Bld gHb Est-mCnc: 88 mg/dL
Hgb A1c MFr Bld: 4.7 % — ABNORMAL LOW (ref 4.8–5.6)

## 2017-01-17 NOTE — Consult Note (Addendum)
[place] Nurse wound consult note Reason for Consult:Consult requested for cellulitis and edema to bilat legs.  Pt is familiar to [facility] team from recent admission last month. Pt states he has been followed at the outpatient wound care center and was waiting to be seen at the lymphademia clinic in [place], when his legs declined in appearance and developed blistering and weeping this week. Wound type: severe lymphedema with cellulitis and partial thickness ulcerations to bilat legs Measurement: Left leg with lymphademia; generalized erythremia and edema; left anterior ankle with .2X6cm linear row of intact, clear fluid-filled blisters.  No open wounds or weeping drainage; patchy areas of dry yellow crusted skin. Right leg with lymphademia; generalized erythemia and edema; right inner ankle with full thickness wound; 2X2X.1cm, red and moist, mod amt yellow drainage; scattered patchy areas of intact, clear fluid-filled blisters, partial thickness wound to right inner calf; 2X3X.1cm red and moist with mod amt yellow drainage, patchy areas of dry yellow crusted skin. Dressing procedure/placement/frequency: Continue present plan of care as recommended by the outpatient wound care center with Aquacel to right leg to provide antimicrobial benefits and absorb drainage, foam dressings to protect and promote healing to blistered areas, Kerlix and ace wrap to provide light compression.   Discussed POC with patient and bedside nurse, he will follow-up with the outpatient wound care center after discharge.   Please re-consult if further assistance is needed.  Thank-you,  Julien Girt MSN, [place], [place], [place], [place]

## 2017-01-17 NOTE — Progress Notes (Signed)
RT set up patient CPAP HS. Patient is able to place himslef on Cpap when he is ready. No assistance is needed.

## 2017-01-17 NOTE — Progress Notes (Addendum)
SHYAM, HEGLUND ([id number]) Visit Report for 01/16/2017 Chief Complaint Document Details Patient Name: Ryan Burgess, Ryan Burgess. Date of Service: 01/16/2017 12:30 PM Medical Record Number: [id number] Patient Account Number: [id number] Date of Birth/Sex: 1971/02/14 (46 y.o. Male) Treating RN: Ahmed Prima Primary Care Provider: BESS, KATY Other Clinician: Referring Provider: Donavan Burnet Treating Provider/Extender: Frann Rider in Treatment: 2 Information Obtained from: Patient Chief Complaint Patient presents to the wound care center for a consult due non healing wound to the right lower extremity with extensive swelling over the last several years Electronic Signature(s) Signed: 01/16/2017 1:16:44 PM By: Christin Fudge MD, FACS Entered By: Christin Fudge on 01/16/2017 13:16:44 Ryan Burgess ([id number]) -------------------------------------------------------------------------------- HPI Details Patient Name: Ryan Burgess Date of Service: 01/16/2017 12:30 PM Medical Record Number: [id number] Patient Account Number: [id number] Date of Birth/Sex: [birth year] (46 y.o. Male) Treating RN: Ahmed Prima Primary Care Provider: BESS, KATY Other Clinician: Referring Provider: Donavan Burnet Treating Provider/Extender: Frann Rider in Treatment: 2 History of Present Illness Location: bilateral lower extremity swelling with weeping and drainage from the right lower extremity Quality: Patient reports experiencing heaviness to affected area(s). Severity: Patient states wound are getting worse. Duration: Patient has had the wound for > 3 months prior to seeking treatment at the wound center Timing: Pain in wound is Intermittent (comes and goes Context: The wound appeared gradually over time Modifying Factors: Other treatment(s) tried include:recently admitted to the hospital with cellulitis and sepsis Associated Signs and Symptoms: Patient reports having increase  discharge. HPI Description: 46 year old gentleman who was recently hospitalized for cellulitis of the right calf, lymphedema, morbid obesity and depression has been on clindamycin, and was referred to [place] for management of cellulitis and lymphedema. Past medical history significant for diabetes mellitus, TIA, edema, morbid obesity, sleep apnea, lymphedema and depression. He is not a smoker. he was recently admitted to the hospital between January 18 and 12/23/2016 for sepsis due to cellulitis of the right leg with hypertension, morbid obesity, lymphedema, acute kidney injury, hepatic cirrhosis and flulike symptoms. during his hospital course he was given vancomycin and Zosyn. he was discharged home on oral clindamycin. He is homebound at present and has not been working. 01/09/2017 -- the patient has not yet heard back from the lymphedema clinic and I have urged him to be in contact with them. The compression wraps which are going to be custom-made have been scheduled for this coming Tuesday. 01/16/2017 -- the patient has developed a lot of redness swelling and perfuse amount of discharge since yesterday and though he is not febrile he has had significant change in his physical findings. As noted he was admitted recently between January 18 and January 22 of 2018 at [facility] for a similar problem Engineer, maintenance) Signed: 01/16/2017 1:17:26 PM By: Christin Fudge MD, FACS Entered By: Christin Fudge on 01/16/2017 13:17:26 Ryan Burgess ([id number]) -------------------------------------------------------------------------------- Physical Exam Details Patient Name: Ryan Burgess Date of Service: 01/16/2017 12:30 PM Medical Record Number: [id number] Patient Account Number: [id number] Date of Birth/Sex: Aug 24, 1971 (46 y.o. Male) Treating RN: Ahmed Prima Primary Care Provider: BESS, KATY Other Clinician: Referring Provider: Donavan Burnet Treating Provider/Extender: Frann Rider in Treatment: 2 Constitutional . Pulse regular. Respirations normal and unlabored. Afebrile. . Eyes Nonicteric. Reactive to light. Ears, Nose, Mouth, and Throat Lips, teeth, and gums WNL.Marland Kitchen Moist mucosa without lesions. Neck supple and nontender. No palpable supraclavicular or cervical adenopathy. Normal sized without goiter. Respiratory WNL. No retractions.. Breath sounds WNL, No rubs, rales, rhonchi, or  wheeze.. Cardiovascular Heart rhythm and rate regular, no murmur or gallop.. Pedal Pulses WNL. No clubbing, cyanosis or edema. Chest Breasts symmetical and no nipple discharge.. Breast tissue WNL, no masses, lumps, or tenderness.. Lymphatic No adneopathy. No adenopathy. No adenopathy. Musculoskeletal Adexa without tenderness or enlargement.. Digits and nails w/o clubbing, cyanosis, infection, petechiae, ischemia, or inflammatory conditions.. Integumentary (Hair, Skin) No suspicious lesions. No crepitus or fluctuance. No peri-wound warmth or erythema. No masses.Marland Kitchen Psychiatric Judgement and insight Intact.. No evidence of depression, anxiety, or agitation.. Notes patient has stage III lymphedema bilaterally but the right leg has excessive amount of weeping ulceration and cellulitis today. Electronic Signature(s) Signed: 01/16/2017 1:18:01 PM By: Christin Fudge MD, FACS Entered By: Christin Fudge on 01/16/2017 13:18:01 Ryan Burgess ([id number]) -------------------------------------------------------------------------------- Physician Orders Details Patient Name: Ryan Burgess Date of Service: 01/16/2017 12:30 PM Medical Record Number: [id number] Patient Account Number: [id number] Date of Birth/Sex: 25-Aug-1971 (46 y.o. Male) Treating RN: Ahmed Prima Primary Care Provider: BESS, KATY Other Clinician: Referring Provider: Donavan Burnet Treating Provider/Extender: Frann Rider in Treatment: 2 Verbal / Phone Orders: Yes ClinicianCarolyne Fiscal,  Debi Read Back and Verified: Yes Diagnosis Coding Wound Cleansing Wound #1 Right,Circumferential Lower Leg o Clean wound with Normal Saline. o May shower with protection. Primary Wound Dressing Wound #1 Right,Circumferential Lower Leg o Aquacel Ag - to any areas that are draining if needed Secondary Dressing Wound #1 Right,Circumferential Lower Leg o ABD pad - if needed to any areas that are draining Dressing Change Frequency Wound #1 Right,Circumferential Lower Leg o Three times weekly - [facility] please wrap after appointment on Tuesday after appointment to get measured for custom compression Follow-up Appointments Wound #1 Right,Circumferential Lower Leg o Return Appointment in 1 week. Edema Control Wound #1 Right,Circumferential Lower Leg o Kerlix and Coban - Right Lower Extremity Additional Orders / Instructions Wound #1 Right,Circumferential Lower Leg o Increase protein intake. o Other: - [facility] straight to [facility]. Home Health Wound #1 Right,Circumferential Lower Leg o [place] Nurse may visit PRN to address patientos wound care needs. SHAYLAN, VOSE ([id number]) o FACE TO FACE ENCOUNTER: MEDICARE and MEDICAID PATIENTS: I certify that this patient is under my care and that I had a face-to-face encounter that meets the physician face-to-face encounter requirements with this patient on this date. The encounter with the patient was in whole or in part for the following MEDICAL CONDITION: (primary reason for [place]) MEDICAL NECESSITY: I certify, that based on my findings, NURSING services are a medically necessary home health service. HOME BOUND STATUS: I certify that my clinical findings support that this patient is homebound (i.e., Due to illness or injury, pt requires aid of supportive devices such as crutches, cane, wheelchairs, walkers, the use of special transportation or the assistance of  another person to leave their place of residence. There is a normal inability to leave the home and doing so requires considerable and taxing effort. Other absences are for medical reasons / religious services and are infrequent or of short duration when for other reasons). o If current dressing causes regression in wound condition, may D/C ordered dressing product/s and apply Normal Saline Moist Dressing daily until next [place] / Other MD appointment. [place] of regression in wound condition at [phone]. o Please direct any NON-WOUND related issues/requests for orders to patient's Primary Care Physician Electronic Signature(s) Signed: 01/16/2017 4:07:08 PM By: Christin Fudge MD, FACS Entered By: Christin Fudge on  01/16/2017 13:18:12 LUDY, [place] ([id number]) -------------------------------------------------------------------------------- Problem List Details Patient Name: JACARIUS, STEBNER. Date of Service: 01/16/2017 12:30 PM Medical Record Number: [id number] Patient Account Number: [id number] Date of Birth/Sex: March 31, 1971 (46 y.o. Male) Treating RN: Ahmed Prima Primary Care Provider: BESS, KATY Other Clinician: Referring Provider: Donavan Burnet Treating Provider/Extender: Frann Rider in Treatment: 2 Active Problems ICD-10 Encounter Code Description Active Date Diagnosis I89.0 Lymphedema, not elsewhere classified 01/02/2017 Yes L03.115 Cellulitis of right lower limb 01/02/2017 Yes E66.01 Morbid (severe) obesity due to excess calories 01/02/2017 Yes R73.03 Prediabetes 01/02/2017 Yes Inactive Problems Resolved Problems Electronic Signature(s) Signed: 01/16/2017 1:16:11 PM By: Christin Fudge MD, FACS Entered By: Christin Fudge on 01/16/2017 13:16:11 Ryan Burgess ([id number]) -------------------------------------------------------------------------------- Progress Note Details Patient Name: Ryan Burgess Date of Service:  01/16/2017 12:30 PM Medical Record Number: [id number] Patient Account Number: [id number] Date of Birth/Sex: [birth year] (46 y.o. Male) Treating RN: Ahmed Prima Primary Care Provider: BESS, KATY Other Clinician: Referring Provider: Donavan Burnet Treating Provider/Extender: Frann Rider in Treatment: 2 Subjective Chief Complaint Information obtained from Patient Patient presents to the wound care center for a consult due non healing wound to the right lower extremity with extensive swelling over the last several years History of Present Illness (HPI) The following HPI elements were documented for the patient's wound: Location: bilateral lower extremity swelling with weeping and drainage from the right lower extremity Quality: Patient reports experiencing heaviness to affected area(s). Severity: Patient states wound are getting worse. Duration: Patient has had the wound for > 3 months prior to seeking treatment at the wound center Timing: Pain in wound is Intermittent (comes and goes Context: The wound appeared gradually over time Modifying Factors: Other treatment(s) tried include:recently admitted to the hospital with cellulitis and sepsis Associated Signs and Symptoms: Patient reports having increase discharge. 46 year old gentleman who was recently hospitalized for cellulitis of the right calf, lymphedema, morbid obesity and depression has been on clindamycin, and was referred to [place] for management of cellulitis and lymphedema. Past medical history significant for diabetes mellitus, TIA, edema, morbid obesity, sleep apnea, lymphedema and depression. He is not a smoker. he was recently admitted to the hospital between January 18 and 12/23/2016 for sepsis due to cellulitis of the right leg with hypertension, morbid obesity, lymphedema, acute kidney injury, hepatic cirrhosis and flulike symptoms. during his hospital course he was given vancomycin and Zosyn. he was discharged  home on oral clindamycin. He is homebound at present and has not been working. 01/09/2017 -- the patient has not yet heard back from the lymphedema clinic and I have urged him to be in contact with them. The compression wraps which are going to be custom-made have been scheduled for this coming Tuesday. 01/16/2017 -- the patient has developed a lot of redness swelling and perfuse amount of discharge since yesterday and though he is not febrile he has had significant change in his physical findings. As noted he was admitted recently between January 18 and January 22 of 2018 at [facility] for a similar problem [place], [place] B. ([id number]) Objective Constitutional Pulse regular. Respirations normal and unlabored. Afebrile. Vitals Time Taken: 12:56 PM, Height: 77 in, Weight: 495 lbs, BMI: 58.7, Temperature: 97.8 F, Pulse: 71 bpm, Respiratory Rate: 18 breaths/min, Blood Pressure: 158/60 mmHg. Eyes Nonicteric. Reactive to light. Ears, Nose, Mouth, and Throat Lips, teeth, and gums WNL.Marland Kitchen Moist mucosa without lesions. Neck supple and nontender. No palpable supraclavicular or cervical adenopathy. Normal sized without goiter. Respiratory WNL. No retractions.. Breath sounds  WNL, No rubs, rales, rhonchi, or wheeze.. Cardiovascular Heart rhythm and rate regular, no murmur or gallop.. Pedal Pulses WNL. No clubbing, cyanosis or edema. Chest Breasts symmetical and no nipple discharge.. Breast tissue WNL, no masses, lumps, or tenderness.. Lymphatic No adneopathy. No adenopathy. No adenopathy. Musculoskeletal Adexa without tenderness or enlargement.. Digits and nails w/o clubbing, cyanosis, infection, petechiae, ischemia, or inflammatory conditions.Marland Kitchen Psychiatric Judgement and insight Intact.. No evidence of depression, anxiety, or agitation.. General Notes: patient has stage III lymphedema bilaterally but the right leg has excessive amount of weeping ulceration and cellulitis  today. Integumentary (Hair, Skin) No suspicious lesions. No crepitus or fluctuance. No peri-wound warmth or erythema. No masses.. Wound #1 status is Open. Original cause of wound was Gradually Appeared. The wound is located on the Right,Circumferential Lower Leg. The wound measures 16cm length x 62.2cm width x 0.1cm depth; 781.628cm^2 area and 78.163cm^3 volume. The wound is limited to skin breakdown. There is no tunneling or undermining noted. There is a large amount of serous drainage noted. The wound margin is flat and intact. There is large (67-100%) granulation within the wound bed. There is no necrotic tissue within the [place], [place] ([id number]) wound bed. The periwound skin appearance exhibited: Scarring, Rubor. The periwound skin appearance did not exhibit: Callus, Crepitus, Excoriation, Induration, Rash, Dry/Scaly, Maceration, Atrophie Blanche, Cyanosis, Ecchymosis, Hemosiderin Staining, Mottled, Pallor, Erythema. Periwound temperature was noted as No Abnormality. The periwound has tenderness on palpation. Assessment Active Problems ICD-10 I89.0 - Lymphedema, not elsewhere classified L03.115 - Cellulitis of right lower limb E66.01 - Morbid (severe) obesity due to excess calories R73.03 - Prediabetes Plan Wound Cleansing: Wound #1 Right,Circumferential Lower Leg: Clean wound with Normal Saline. May shower with protection. Primary Wound Dressing: Wound #1 Right,Circumferential Lower Leg: Aquacel Ag - to any areas that are draining if needed Secondary Dressing: Wound #1 Right,Circumferential Lower Leg: ABD pad - if needed to any areas that are draining Dressing Change Frequency: Wound #1 Right,Circumferential Lower Leg: Three times weekly - [facility] please wrap after appointment on Tuesday after appointment to get measured for custom compression Follow-up Appointments: Wound #1 Right,Circumferential Lower Leg: Return Appointment in 1 week. Edema Control: Wound #1  Right,Circumferential Lower Leg: Kerlix and Coban - Right Lower Extremity Additional Orders / Instructions: Wound #1 Right,Circumferential Lower Leg: Increase protein intake. Other: - [facility] straight [facility]. Home Health: DASHAN, KINLOCK ([id number]) Wound #1 Right,Circumferential Lower Leg: [place] Nurse may visit PRN to address patient s wound care needs. FACE TO FACE ENCOUNTER: MEDICARE and MEDICAID PATIENTS: I certify that this patient is under my care and that I had a face-to-face encounter that meets the physician face-to-face encounter requirements with this patient on this date. The encounter with the patient was in whole or in part for the following MEDICAL CONDITION: (primary reason for [place]) MEDICAL NECESSITY: I certify, that based on my findings, NURSING services are a medically necessary home health service. HOME BOUND STATUS: I certify that my clinical findings support that this patient is homebound (i.e., Due to illness or injury, pt requires aid of supportive devices such as crutches, cane, wheelchairs, walkers, the use of special transportation or the assistance of another person to leave their place of residence. There is a normal inability to leave the home and doing so requires considerable and taxing effort. Other absences are for medical reasons / religious services and are infrequent or of short duration when for other reasons). If current dressing  causes regression in wound condition, may D/C ordered dressing product/s and apply Normal Saline Moist Dressing daily until next [place] / Other MD appointment. [place] of regression in wound condition at [phone]. Please direct any NON-WOUND related issues/requests for orders to patient's Primary Care Physician After review today I have recommended: 1. the patient has a florid cellulitis of his right lower extremity, with a  lot of weeping and lymphedema which has increased significantly since last week. 2. I have recommended inpatient admission and IV antibiotics as soon as possible and he would like to go to [facility] and I will make an appropriate call for a direct admit 3. Silver alginate and light Kerlix and Coban to keep the dressing in place 4. elevation and exercise 5. After his acute situation has been sorted out he will be referred to the lymphedema clinic at [facility] The patient has had all questions answered and will be compliant. I am awaiting a call back from the admitting physician of the [facility] hospitalist group. Notes I have spoken to Dr. Aggie Moats and discussed the management of the patient and he has accepted him for a direct admit to [facility] in [place] Signature(s) Signed: 01/16/2017 4:09:50 PM By: Christin Fudge MD, FACS Previous Signature: 01/16/2017 2:01:42 PM Version By: Christin Fudge MD, FACS Previous Signature: 01/16/2017 1:21:21 PM Version By: Christin Fudge MD, FACS Entered By: Christin Fudge on 01/16/2017 16:09:50 Ryan Burgess ([id number]) -------------------------------------------------------------------------------- SuperBill Details Patient Name: Ryan Burgess Date of Service: 01/16/2017 Medical Record Number: [id number] Patient Account Number: [id number] Date of Birth/Sex: [birth year] (46 y.o. Male) Treating RN: Ahmed Prima Primary Care Provider: BESS, KATY Other Clinician: Referring Provider: Donavan Burnet Treating Provider/Extender: Christin Fudge Service Line: Outpatient Weeks in Treatment: 2 Diagnosis Coding ICD-10 Codes Code Description I89.0 Lymphedema, not elsewhere classified L03.115 Cellulitis of right lower limb E66.01 Morbid (severe) obesity due to excess calories R73.03 Prediabetes Facility Procedures CPT4 Code: [id number] Description: 99213 - WOUND CARE VISIT-LEV 3 EST  PT Modifier: Quantity: 1 Physician Procedures CPT4 [id number] Description: [id number] - WC PHYS LEVEL 4 - EST PT ICD-10 Description Diagnosis I89.0 Lymphedema, not elsewhere classified L03.115 Cellulitis of right lower limb E66.01 Morbid (severe) obesity due to excess calori R73.03 Prediabetes Modifier: es Quantity: 1 Electronic Signature(s) Unsigned Previous Signature: 01/16/2017 1:21:35 PM Version By: Christin Fudge MD, FACS Entered By: Alric Quan on 01/16/2017 16:30:50 Signature(s): Date(s):

## 2017-01-17 NOTE — Progress Notes (Signed)
[facility]  PROGRESS NOTE  Ryan Burgess [id number] DOB: 11/21/1971 DOA: 01/16/2017 PCP: Lillard Anes, NP   Brief HPI:    46 y.o. male with Past medical history of morbid obesity, chronic lymphedema and hepatic hypertension, Recurrent cellulitis. Presented to wound care center as an outpatient for routine follow-up. He reportedly for last 3 days patient has noted redevelopment of blisters on the right leg with clear, foul-smelling discharge when the rupture. This was associated with increasing redness of the leg. He denies having any fever or chills no nausea no vomiting no leg cramps chest pain and abdominal pain. No rash anywhere else. They also noted a small blister on the left leg. Given this finding the wound care recommended to the patient to be admitted in the hospital for IV antibiotics     Subjective   This morning patient feels better, redness of leg has improved.   Assessment/Plan:     1. Right leg cellulitis- improving, sedimentation rate 33, CRP less than 0.8. Continue vancomycin and Zosyn. If continues to improve can be discharged on by mouth antibiotics in next 24-48 hours. 2. Liver cirrhosis- continue home regimen of lactulose and rifaximin. Lasix was increased to 60 mg daily. Follow BMP in a.m. 3. Chronic thrombocytopenia- secondary to liver cirrhosis, this morning platelet count is 74. Follow CBC in a.m. 4. Obstructive sleep apnea- continue CPAP    DVT prophylaxis: SCDs  Code Status: Full code  Family Communication: No family at bedside   Disposition Plan: Home when cellulitis improves   Consultants:  None  Procedures:  None       Antibiotics:   Anti-infectives    Start     Dose/Rate Route Frequency Ordered Stop   01/17/17 0500  vancomycin (VANCOCIN) 1,500 mg in sodium chloride 0.9 % 500 mL IVPB     1,500 mg 250 mL/hr over 120 Minutes Intravenous Every 8 hours 01/16/17 2041     01/16/17 2200  rifaximin (XIFAXAN) tablet 550 mg      550 mg Oral 2 times daily 01/16/17 1752     01/16/17 1830  piperacillin-tazobactam (ZOSYN) IVPB 3.375 g     3.375 g 12.5 mL/hr over 240 Minutes Intravenous Every 8 hours 01/16/17 1757     01/16/17 1830  vancomycin (VANCOCIN) 2,500 mg in sodium chloride 0.9 % 500 mL IVPB     2,500 mg 250 mL/hr over 120 Minutes Intravenous  Once 01/16/17 1805 01/16/17 2243   01/16/17 1800  piperacillin-tazobactam (ZOSYN) IVPB 3.375 g  Status:  Discontinued     3.375 g 100 mL/hr over 30 Minutes Intravenous  Once 01/16/17 1753 01/16/17 1756   01/16/17 1800  vancomycin (VANCOCIN) IVPB 1000 mg/200 mL premix  Status:  Discontinued     1,000 mg 200 mL/hr over 60 Minutes Intravenous  Once 01/16/17 1753 01/16/17 1755       Objective   Vitals:   01/16/17 1853 01/16/17 2215 01/17/17 0639 01/17/17 1416  BP:  (!) 144/56 (!) 136/45 (!) 136/47  Pulse: 83 81 73 78  Resp:  18 18   Temp:  98.4 F (36.9 C) 97.6 F (36.4 C) 98.1 F (36.7 C)  TempSrc:  Oral Oral Oral  SpO2: 100% 99% 98% 99%  Weight:   (!) 224.1 kg (494 lb)   Height:        Intake/Output Summary (Last 24 hours) at 01/17/17 1520 Last data filed at 01/17/17 1413  Gross per 24 hour  Intake  1510 ml  Output             2900 ml  Net            -1390 ml   Filed Weights   01/16/17 1659 01/17/17 0639  Weight: (!) 233.5 kg (514 lb 12.8 oz) (!) 224.1 kg (494 lb)     Physical Examination:  General exam: Appears calm and comfortable. Respiratory system: Clear to auscultation. Respiratory effort normal. Cardiovascular system:  RRR. No  murmurs, rubs, gallops. No pedal edema. GI system: Abdomen is nondistended, soft and nontender. No organomegaly.  Central nervous system. No focal neurological deficits. 5 x 5 power in all extremities. Skin: Mild erythema noted in the right lower extremity, no visible lesions. Psychiatry: Alert, oriented x 3.Judgement and insight appear normal. Affect normal.    Data Reviewed: I have personally  reviewed following labs and imaging studies  CBG: No results for input(s): GLUCAP in the last 168 hours.  CBC:  Recent Labs Lab 01/16/17 0859 01/16/17 1910 01/17/17 0428  WBC 4.2 3.7* 2.8*  NEUTROABS 1.1* 1.1*  --   HGB  --  11.9* 10.9*  HCT 38.2 34.9* 31.8*  MCV 97 95.4 94.9  PLT 105* 83* 74*    Basic Metabolic Panel:  Recent Labs Lab 01/16/17 0859 01/16/17 1919 01/17/17 0428  NA 142 139 138  K 4.2 3.9 3.5  CL 104 106 109  CO2 26 27 25   GLUCOSE 79 76 82  BUN 16 13 12   CREATININE 0.82 0.81 0.70  CALCIUM 8.1* 8.4* 7.9*  MG  --   --  1.7    No results found for this or any previous visit (from the past 240 hour(s)).   Liver Function Tests:  Recent Labs Lab 01/16/17 0859 01/16/17 1919  AST 55* 56*  ALT 24 24  ALKPHOS 91 78  BILITOT 1.4* 1.5*  PROT 6.6 6.3*  ALBUMIN 2.7* 2.2*   No results for input(s): LIPASE, AMYLASE in the last 168 hours. No results for input(s): AMMONIA in the last 168 hours.  Cardiac Enzymes: No results for input(s): CKTOTAL, CKMB, CKMBINDEX, TROPONINI in the last 168 hours. BNP (last 3 results) No results for input(s): BNP in the last 8760 hours.  ProBNP (last 3 results) No results for input(s): PROBNP in the last 8760 hours.    Studies: No results found.  Scheduled Meds: . aspirin EC  81 mg Oral Daily  . atorvastatin  20 mg Oral QHS  . furosemide  60 mg Oral Daily  . lactulose  30 g Oral TID  . multivitamin with minerals  1 tablet Oral Daily  . pantoprazole  40 mg Oral Daily  . piperacillin-tazobactam (ZOSYN)  IV  3.375 g Intravenous Q8H  . rifaximin  550 mg Oral BID  . sertraline  50 mg Oral Daily  . vancomycin  1,500 mg Intravenous Q8H      Time spent: 25 min  [facility] Pager [phone]. If 7PM-7AM, please contact night-coverage at [email or web address], Office  [phone]  password [email or web address] 01/17/2017, 3:20 PM  LOS: 1 day

## 2017-01-18 DIAGNOSIS — L03115 Cellulitis of right lower limb: Principal | ICD-10-CM

## 2017-01-18 LAB — CBC
HCT: 31.9 % — ABNORMAL LOW (ref 39.0–52.0)
HEMOGLOBIN: 11.1 g/dL — AB (ref 13.0–17.0)
MCH: 32.8 pg (ref 26.0–34.0)
MCHC: 34.8 g/dL (ref 30.0–36.0)
MCV: 94.4 fL (ref 78.0–100.0)
Platelets: 72 10*3/uL — ABNORMAL LOW (ref 150–400)
RBC: 3.38 MIL/uL — AB (ref 4.22–5.81)
RDW: 16.5 % — ABNORMAL HIGH (ref 11.5–15.5)
WBC: 3.6 10*3/uL — AB (ref 4.0–10.5)

## 2017-01-18 LAB — BASIC METABOLIC PANEL
Anion gap: 7 (ref 5–15)
BUN: 9 mg/dL (ref 6–20)
CHLORIDE: 107 mmol/L (ref 101–111)
CO2: 24 mmol/L (ref 22–32)
CREATININE: 0.77 mg/dL (ref 0.61–1.24)
Calcium: 7.6 mg/dL — ABNORMAL LOW (ref 8.9–10.3)
GFR calc non Af Amer: >60 mL/min (ref >60)
Glucose, Bld: 76 mg/dL (ref 65–99)
Potassium: 3.5 mmol/L (ref 3.5–5.1)
SODIUM: 138 mmol/L (ref 135–145)

## 2017-01-18 MED ORDER — AMOXICILLIN-POT CLAVULANATE 875-125 MG PO TABS: 1.0000 | ORAL_TABLET | Freq: Two times a day (BID) | ORAL | 0 refills | Status: DC

## 2017-01-18 MED ORDER — DOXYCYCLINE HYCLATE 100 MG PO CAPS: 100.0000 mg | ORAL_CAPSULE | Freq: Two times a day (BID) | ORAL | 0 refills | Status: DC

## 2017-01-18 MED ORDER — DOXYCYCLINE HYCLATE 100 MG PO TABS
100.0000 mg | ORAL_TABLET | Freq: Two times a day (BID) | ORAL | Status: DC
  Administered 2017-01-18 – 2017-01-19 (×2): 100 mg via ORAL
  Filled 2017-01-18 (×2): qty 1

## 2017-01-18 MED ORDER — AMOXICILLIN-POT CLAVULANATE 875-125 MG PO TABS
1.0000 | ORAL_TABLET | Freq: Two times a day (BID) | ORAL | Status: DC
  Administered 2017-01-18 – 2017-01-19 (×2): 1 via ORAL
  Filled 2017-01-18 (×2): qty 1

## 2017-01-18 NOTE — Progress Notes (Signed)
Ryan Burgess, Ryan Burgess ([id number]) Visit Report for 01/16/2017 Arrival Information Details Patient Name: Ryan Burgess, Ryan Burgess. Date of Service: 01/16/2017 12:30 PM Medical Record Number: [id number] Patient Account Number: [id number] Date of Birth/Sex: 08-07-1971 (46 y.o. Male) Treating RN: Ahmed Prima Primary Care Avaneesh Pepitone: BESS, KATY Other Clinician: Referring Shantel Wesely: Donavan Burnet Treating Dwon Sky/Extender: Frann Rider in Treatment: 2 Visit Information History Since Last Visit All ordered tests and consults were completed: No Patient Arrived: Ambulatory Added or deleted any medications: No Arrival Time: 12:42 Any new allergies or adverse reactions: No Accompanied By: self Had a fall or experienced change in No Transfer Assistance: None activities of daily living that may affect Patient Identification Verified: Yes risk of falls: Secondary Verification Process Yes Signs or symptoms of abuse/neglect since last No Completed: visito Patient Has Alerts: Yes Hospitalized since last visit: No Patient Alerts: DMII Has Dressing in Place as Prescribed: Yes Has Compression in Place as Prescribed: Yes Pain Present Now: No Electronic Signature(s) Signed: 01/17/2017 4:03:50 PM By: Alric Quan Entered By: Alric Quan on 01/16/2017 12:44:44 Ryan Burgess ([id number]) -------------------------------------------------------------------------------- Clinic Level of Care Assessment Details Patient Name: Ryan Burgess Date of Service: 01/16/2017 12:30 PM Medical Record Number: [id number] Patient Account Number: [id number] Date of Birth/Sex: 1971/11/11 (46 y.o. Male) Treating RN: Ahmed Prima Primary Care Wednesday Ericsson: BESS, KATY Other Clinician: Referring Arbie Reisz: Donavan Burnet Treating Temika Sutphin/Extender: Frann Rider in Treatment: 2 Clinic Level of Care Assessment Items TOOL 4 Quantity Score X - Use when only an EandM is performed on FOLLOW-UP  visit 1 0 ASSESSMENTS - Nursing Assessment / Reassessment X - Reassessment of Co-morbidities (includes updates in patient status) 1 10 X - Reassessment of Adherence to Treatment Plan 1 5 ASSESSMENTS - Wound and Skin Assessment / Reassessment X - Simple Wound Assessment / Reassessment - one wound 1 5 []  - Complex Wound Assessment / Reassessment - multiple wounds 0 []  - Dermatologic / Skin Assessment (not related to wound area) 0 ASSESSMENTS - Focused Assessment X - Circumferential Edema Measurements - multi extremities 1 5 []  - Nutritional Assessment / Counseling / Intervention 0 []  - Lower Extremity Assessment (monofilament, tuning fork, pulses) 0 []  - Peripheral Arterial Disease Assessment (using hand held doppler) 0 ASSESSMENTS - Ostomy and/or Continence Assessment and Care []  - Incontinence Assessment and Management 0 []  - Ostomy Care Assessment and Management (repouching, etc.) 0 PROCESS - Coordination of Care []  - Simple Patient / Family Education for ongoing care 0 X - Complex (extensive) Patient / Family Education for ongoing care 1 20 X - Staff obtains Programmer, systems, Records, Test Results / Process Orders 1 10 []  - Staff telephones HHA, Nursing Homes / Clarify orders / etc 0 []  - Routine Transfer to another Facility (non-emergent condition) 0 [place], [place] ([id number]) X - Routine Hospital Admission (non-emergent condition) 1 10 []  - New Admissions / Biomedical engineer / Ordering NPWT, Apligraf, etc. 0 []  - Emergency Hospital Admission (emergent condition) 0 X - Simple Discharge Coordination 1 10 []  - Complex (extensive) Discharge Coordination 0 PROCESS - Special Needs []  - Pediatric / Minor Patient Management 0 []  - Isolation Patient Management 0 []  - Hearing / Language / Visual special needs 0 []  - Assessment of Community assistance (transportation, D/C planning, etc.) 0 []  - Additional assistance / Altered mentation 0 []  - Support Surface(s) Assessment (bed, cushion,  seat, etc.) 0 INTERVENTIONS - Wound Cleansing / Measurement []  - Simple Wound Cleansing - one wound 0 X - Complex Wound Cleansing - multiple wounds  1 5 X - Wound Imaging (photographs - any number of wounds) 1 5 []  - Wound Tracing (instead of photographs) 0 []  - Simple Wound Measurement - one wound 0 X - Complex Wound Measurement - multiple wounds 1 5 INTERVENTIONS - Wound Dressings []  - Small Wound Dressing one or multiple wounds 0 []  - Medium Wound Dressing one or multiple wounds 0 X - Large Wound Dressing one or multiple wounds 1 20 []  - Application of Medications - topical 0 []  - Application of Medications - injection 0 INTERVENTIONS - Miscellaneous []  - External ear exam 0 Ryan Burgess, Ryan B. ([id number]) []  - Specimen Collection (cultures, biopsies, blood, body fluids, etc.) 0 []  - Specimen(s) / Culture(s) sent or taken to Lab for analysis 0 []  - Patient Transfer (multiple staff / Harrel Lemon Lift / Similar devices) 0 []  - Simple Staple / Suture removal (25 or less) 0 []  - Complex Staple / Suture removal (26 or more) 0 []  - Hypo / Hyperglycemic Management (close monitor of Blood Glucose) 0 []  - Ankle / Brachial Index (ABI) - do not check if billed separately 0 X - Vital Signs 1 5 Has the patient been seen at the hospital within the last three years: Yes Total Score: 115 Level Of Care: New/Established - Level 3 Electronic Signature(s) Signed: 01/17/2017 4:03:50 PM By: Alric Quan Entered By: Alric Quan on 01/16/2017 16:30:33 Ryan Burgess ([id number]) -------------------------------------------------------------------------------- Encounter Discharge Information Details Patient Name: Ryan Burgess Date of Service: 01/16/2017 12:30 PM Medical Record Number: [id number] Patient Account Number: [id number] Date of Birth/Sex: [birth year] (46 y.o. Male) Treating RN: Ahmed Prima Primary Care Emric Kowalewski: BESS, KATY Other Clinician: Referring Bethannie Iglehart: Donavan Burnet Treating Falan Hensler/Extender: Frann Rider in Treatment: 2 Encounter Discharge Information Items Discharge Pain Level: 0 Discharge Condition: Stable Ambulatory Status: Ambulatory Other (Note Discharge Destination: Required) Transportation: Private Auto Accompanied By: self Schedule Follow-up Appointment: Yes Medication Reconciliation completed and provided to Patient/Care No Shanigua Gibb: Provided on Clinical Summary of Care: 01/16/2017 Form Type Recipient Paper Patient TC Electronic Signature(s) Signed: 01/16/2017 1:19:36 PM By: Ruthine Dose Entered By: Ruthine Dose on 01/16/2017 13:19:36 Ryan Burgess ([id number]) -------------------------------------------------------------------------------- Lower Extremity Assessment Details Patient Name: Ryan Burgess Date of Service: 01/16/2017 12:30 PM Medical Record Number: [id number] Patient Account Number: [id number] Date of Birth/Sex: Feb 07, 1971 (46 y.o. Male) Treating RN: Ahmed Prima Primary Care Kathrynn Backstrom: BESS, KATY Other Clinician: Referring Sayuri Rhames: Donavan Burnet Treating Akima Slaugh/Extender: Frann Rider in Treatment: 2 Edema Assessment Assessed: [Left: No] [Right: No] E[Left: dema] [Right: :] Calf Left: Right: Point of Measurement: 36 cm From Medial Instep cm 55 cm Ankle Left: Right: Point of Measurement: 12 cm From Medial Instep cm 40.4 cm Vascular Assessment Pulses: Dorsalis Pedis Palpable: [Right:Yes] Posterior Tibial Extremity colors, hair growth, and conditions: Extremity Color: [Right:Red] Temperature of Extremity: [Right:Warm] Capillary Refill: [Right:< 3 seconds] Electronic Signature(s) Signed: 01/17/2017 4:03:50 PM By: Alric Quan Entered By: Alric Quan on 01/16/2017 14:03:40 Ryan Burgess ([id number]) -------------------------------------------------------------------------------- Multi Wound Chart Details Patient Name: Ryan Burgess Date of  Service: 01/16/2017 12:30 PM Medical Record Number: [id number] Patient Account Number: [id number] Date of Birth/Sex: 31-Oct-1971 (46 y.o. Male) Treating RN: Ahmed Prima Primary Care Camelia Stelzner: BESS, KATY Other Clinician: Referring Amarea Macdowell: Donavan Burnet Treating Estelita Iten/Extender: Frann Rider in Treatment: 2 Vital Signs Height(in): 77 Pulse(bpm): 71 Weight(lbs): 495 Blood Pressure 158/60 (mmHg): Body Mass Index(BMI): 59 Temperature(F): 97.8 Respiratory Rate 16 (breaths/min): Photos: [1:No Photos] [N/A:N/A] Wound Location: [1:Right Lower Leg - Circumfernential] [N/A:N/A] Wounding Event: [1:Gradually Appeared] [  N/A:N/A] Primary Etiology: [1:Lymphedema] [N/A:N/A] Secondary Etiology: [1:Diabetic Wound/Ulcer of the Lower Extremity] [N/A:N/A] Comorbid History: [1:Lymphedema, Sleep Apnea, Cirrhosis , Type II Diabetes] [N/A:N/A] Date Acquired: [1:12/16/2016] [N/A:N/A] Weeks of Treatment: [1:2] [N/A:N/A] Wound Status: [1:Open] [N/A:N/A] Measurements L x W x D 16x62.2x0.1 [N/A:N/A] (cm) Area (cm) : [id number] [N/A:N/A] Volume (cm) : [1:78.163] [N/A:N/A] % Reduction in Area: [1:-426.60%] [N/A:N/A] % Reduction in Volume: -426.60% [N/A:N/A] Classification: [1:Partial Thickness] [N/A:N/A] HBO Classification: [1:Grade 1] [N/A:N/A] Exudate Amount: [1:Large] [N/A:N/A] Exudate Type: [1:Serous] [N/A:N/A] Exudate Color: [1:amber] [N/A:N/A] Foul Odor After [1:Yes] [N/A:N/A] Cleansing: Odor Anticipated Due to No [N/A:N/A] Product Use: Wound Margin: [1:Flat and Intact] [N/A:N/A] Granulation Amount: [1:Large (67-100%)] [N/A:N/A] Necrotic Amount: None Present (0%) N/A N/A Exposed Structures: Fascia: No N/A N/A Fat Layer (Subcutaneous Tissue) Exposed: No Tendon: No Muscle: No Joint: No Bone: No Limited to Skin Breakdown Epithelialization: None N/A N/A Periwound Skin Texture: Scarring: Yes N/A N/A Excoriation: No Induration: No Callus: No Crepitus: No Rash:  No Periwound Skin Maceration: No N/A N/A Moisture: Dry/Scaly: No Periwound Skin Color: Rubor: Yes N/A N/A Atrophie Blanche: No Cyanosis: No Ecchymosis: No Erythema: No Hemosiderin Staining: No Mottled: No Pallor: No Temperature: No Abnormality N/A N/A Tenderness on Yes N/A N/A Palpation: Wound Preparation: Ulcer Cleansing: Other: N/A N/A soap and water Topical Anesthetic Applied: None Treatment Notes Wound #1 (Right, Circumferential Lower Leg) 1. Cleansed with: Clean wound with Normal Saline Cleanse wound with antibacterial soap and water 4. Dressing Applied: Aquacel Ag 5. Secondary Dressing Applied ABD Pad 7. Secured with Tape Notes chueyee, koeppel ([id number]) Electronic Signature(s) Signed: 01/16/2017 1:16:28 PM By: Christin Fudge MD, FACS Entered By: Christin Fudge on 01/16/2017 13:16:28 Ryan Burgess ([id number]) -------------------------------------------------------------------------------- [place] Details Patient Name: Ryan Burgess Date of Service: 01/16/2017 12:30 PM Medical Record Number: [id number] Patient Account Number: [id number] Date of Birth/Sex: May 06, 1971 (46 y.o. Male) Treating RN: Ahmed Prima Primary Care Mahum Betten: BESS, KATY Other Clinician: Referring Lion Fernandez: Donavan Burnet Treating Alyssia Heese/Extender: Frann Rider in Treatment: 2 Active Inactive ` Abuse / Safety / Falls / Self Care Management Nursing Diagnoses: Impaired physical mobility Potential for falls Goals: Patient will remain injury free Date Initiated: 01/02/2017 Target Resolution Date: 04/03/2017 Goal Status: Active Interventions: Assess fall risk on admission and as needed Notes: ` Orientation to the Wound Care Program Nursing Diagnoses: Knowledge deficit related to the wound healing center program Goals: Patient/caregiver will verbalize understanding of the [place] Program Date Initiated:  01/02/2017 Target Resolution Date: 04/03/2017 Goal Status: Active Interventions: Provide education on orientation to the wound center Notes: ` Wound/Skin Impairment Nursing Diagnoses: Impaired tissue integrity Ryan Burgess, Ryan Burgess ([id number]) Goals: Patient/caregiver will verbalize understanding of skin care regimen Date Initiated: 01/02/2017 Target Resolution Date: 04/03/2017 Goal Status: Active Ulcer/skin breakdown will have a volume reduction of 30% by week 4 Date Initiated: 01/02/2017 Target Resolution Date: 04/03/2017 Goal Status: Active Ulcer/skin breakdown will have a volume reduction of 50% by week 8 Date Initiated: 01/02/2017 Target Resolution Date: 04/03/2017 Goal Status: Active Ulcer/skin breakdown will have a volume reduction of 80% by week 12 Date Initiated: 01/02/2017 Target Resolution Date: 04/03/2017 Goal Status: Active Ulcer/skin breakdown will heal within 14 weeks Date Initiated: 01/02/2017 Target Resolution Date: 04/03/2017 Goal Status: Active Interventions: Assess patient/caregiver ability to obtain necessary supplies Assess patient/caregiver ability to perform ulcer/skin care regimen upon admission and as needed Assess ulceration(s) every visit Notes: Electronic Signature(s) Signed: 01/17/2017 4:03:50 PM By: Alric Quan Entered By: Alric Quan on 01/16/2017 13:05:25 Ryan Presto  B. ([id number]) -------------------------------------------------------------------------------- Pain Assessment Details Patient Name: Ryan Burgess, Ryan Burgess. Date of Service: 01/16/2017 12:30 PM Medical Record Number: [id number] Patient Account Number: [id number] Date of Birth/Sex: 1971-06-03 (46 y.o. Male) Treating RN: Ahmed Prima Primary Care Ragina Fenter: BESS, KATY Other Clinician: Referring Norah Devin: Donavan Burnet Treating Linnell Swords/Extender: Frann Rider in Treatment: 2 Active Problems Location of Pain Severity and Description of Pain Patient Has Paino No Site  Locations With Dressing Change: No Pain Management and Medication Current Pain Management: Electronic Signature(s) Signed: 01/17/2017 4:03:50 PM By: Alric Quan Entered By: Alric Quan on 01/16/2017 12:45:06 Ryan Burgess ([id number]) -------------------------------------------------------------------------------- Patient/Caregiver Education Details Patient Name: Ryan Burgess Date of Service: 01/16/2017 12:30 PM Medical Record Number: [id number] Patient Account Number: [id number] Date of Birth/Gender: 07/02/1971 (46 y.o. Male) Treating RN: Ahmed Prima Primary Care Physician: BESS, KATY Other Clinician: Referring Physician: Donavan Burnet Treating Physician/Extender: Frann Rider in Treatment: 2 Education Assessment Education Provided To: Patient Education Topics Provided Wound/Skin Impairment: Handouts: Other: wait on bed for [facility] admission Methods: Demonstration, Explain/Verbal Responses: State content correctly Electronic Signature(s) Signed: 01/17/2017 4:03:50 PM By: Alric Quan Entered By: Alric Quan on 01/16/2017 13:18:33 Ryan Burgess ([id number]) -------------------------------------------------------------------------------- Wound Assessment Details Patient Name: Ryan Burgess Date of Service: 01/16/2017 12:30 PM Medical Record Number: [id number] Patient Account Number: [id number] Date of Birth/Sex: 1971-06-22 (46 y.o. Male) Treating RN: Carolyne Fiscal, Debi Primary Care Asma Boldon: BESS, KATY Other Clinician: Referring Azaliah Carrero: Donavan Burnet Treating Massiel Stipp/Extender: Frann Rider in Treatment: 2 Wound Status Wound Number: 1 Primary Lymphedema Etiology: Wound Location: Right Lower Leg - Circumfernential Secondary Diabetic Wound/Ulcer of the Lower Etiology: Extremity Wounding Event: Gradually Appeared Wound Status: Open Date Acquired: 12/16/2016 Comorbid Lymphedema, Sleep Apnea, Weeks Of Treatment:  2 History: Cirrhosis , Type II Diabetes Clustered Wound: No Wound Measurements Length: (cm) 16 Width: (cm) 62.2 Depth: (cm) 0.1 Area: (cm) 781.628 Volume: (cm) 78.163 % Reduction in Area: -426.6% % Reduction in Volume: -426.6% Epithelialization: None Tunneling: No Undermining: No Wound Description Classification: Partial Thickness Foul Odor A Diabetic Severity (Wagner): Grade 1 Due to Prod Wound Margin: Flat and Intact Slough/Fibr Exudate Amount: Large Exudate Type: Serous Exudate Color: amber fter Cleansing: Yes uct Use: No ino No Wound Bed Granulation Amount: Large (67-100%) Exposed Structure Necrotic Amount: None Present (0%) Fascia Exposed: No Fat Layer (Subcutaneous Tissue) Exposed: No Tendon Exposed: No Muscle Exposed: No Joint Exposed: No Bone Exposed: No Limited to Skin Breakdown Periwound Skin Texture Texture Color No Abnormalities Noted: No No Abnormalities Noted: No Callus: No Atrophie Blanche: No Crepitus: No Cyanosis: No Pe, Dorwin B. ([id number]) Excoriation: No Ecchymosis: No Induration: No Erythema: No Rash: No Hemosiderin Staining: No Scarring: Yes Mottled: No Pallor: No Moisture Rubor: Yes No Abnormalities Noted: No Dry / Scaly: No Temperature / Pain Maceration: No Temperature: No Abnormality Tenderness on Palpation: Yes Wound Preparation Ulcer Cleansing: Other: soap and water, Topical Anesthetic Applied: None Treatment Notes Wound #1 (Right, Circumferential Lower Leg) 1. Cleansed with: Clean wound with Normal Saline Cleanse wound with antibacterial soap and water 4. Dressing Applied: Aquacel Ag 5. Secondary Dressing Applied ABD Pad 7. Secured with Tape Notes kerlix, coban Electronic Signature(s) Signed: 01/17/2017 4:03:50 PM By: Alric Quan Entered By: Alric Quan on 01/16/2017 12:56:46 Ryan Burgess  ([id number]) -------------------------------------------------------------------------------- [place] Details Patient Name: Ryan Burgess Date of Service: 01/16/2017 12:30 PM Medical Record Number: [id number] Patient Account Number: [id number] Date of Birth/Sex: [birth year] (46 y.o. Male) Treating RN: Ahmed Prima Primary Care Nashia Remus: BESS, Valetta Fuller Other Clinician: Referring  Jerene Yeager: Donavan Burnet Treating Hermenegildo Clausen/Extender: Frann Rider in Treatment: 2 Vital Signs Time Taken: 12:56 Temperature (F): 97.8 Height (in): 77 Pulse (bpm): 71 Weight (lbs): 495 Respiratory Rate (breaths/min): 18 Body Mass Index (BMI): 58.7 Blood Pressure (mmHg): 158/60 Reference Range: 80 - 120 mg / dl Electronic Signature(s) Signed: 01/17/2017 4:03:50 PM By: Alric Quan Entered By: Alric Quan on 01/16/2017 16:00:19

## 2017-01-18 NOTE — Progress Notes (Signed)
PT Cancellation Note/Screen  Patient Details Name: Ryan Burgess MRN: [id number] DOB: [birth year]   Cancelled Treatment:    Reason Eval/Treat Not Completed: PT screened, no needs identified, will sign off. Pt does not want to get OOB, but reports being able to mobilize in room without assistance. Discussed with pt ways to elevate LE's without having to lay supine including sitting in recliner and getting a wedge to put behind his back in bed. Discussed the possibility of getting a hospital bed, pt does not want to go this route at this time. All education complete and questions answered. Please re-order PT if needed.    Scheryl Marten PT, DPT  [phone]  01/18/2017, 2:13 PM

## 2017-01-18 NOTE — Discharge Summary (Deleted)
Physician Discharge Summary  Ryan Burgess [id number] DOB: 08/05/1971 DOA: 01/16/2017  PCP: Laurance Flatten, NP  Admit date: 01/16/2017 Discharge date: 01/18/2017  Admitted From: Home  Disposition:  Home  Recommendations for Outpatient Follow-up:  1. Follow up with PCP in 1-2 weeks 2. Please follow With Wound Care clinic within a week  Home Health: Yes  Equipment/Devices: None   Discharge Condition: Stable  CODE STATUS: Full   Diet recommendation: Regular  Discharge Diagnoses:  Principal Problem:   Cellulitis of leg, right Active Problems:   HTN (hypertension)   GERD (gastroesophageal reflux disease)   Morbid obesity (HCC)   OSA on CPAP   Thrombocytopenia (HCC)   Hepatic cirrhosis (HCC)  Right leg cellulitis- with ulceration, likely secondary to lymphedema and morbid obesity. Chronic leukopenia which was stable on admission- WBC- 4.2 on admission, Sedimentation rate 33, CRP less than 0.8.  - Was started on IV vancomycin and Zosyn and discharged on PO antibiotics- doxycycline 100 mg twice a day and Augmentin to complete a total of 14 days of antibiotics.  - Imaging and blood cultures were not done on admission.  - Wound care was consulted- RecsContinue present plan of care as recommended by the outpatient wound care center with Aquacel to right leg to provide antimicrobial benefits and absorb drainage, foam dressings to protect and promote healing to blistered areas, Kerlix and ace wrap to provide light compression.   - Pt will follow-up with the outpatient wound care center after discharge - Resume HHN   Liver cirrhosis- continue home regimen of lactulose and rifaximin. Lasix was increased to 60 mg daily, and admission will continue home 40 mg on discharge with's mildly soft blood pressure.  Chronic thrombocytopenia- secondary to liver cirrhosis, platelet 72 on discharge.  Obstructive sleep apnea- continue CPAP   HPI By Lynden Oxford, MD: Ryan Burgess is a 46 y.o. male  with Past medical history of morbid obesity, chronic lymphedema and hepatic cirrhosis, hypertension, Recurrent cellulitis. Presented to wound care center as an outpatient for routine follow-up. He reportedly for last 3 days patient has noted redevelopment of blisters on the right leg with clear, foul-smelling discharge when the rupture. This was associated with increasing redness of the leg. He denies having any fever or chills no nausea no vomiting no leg cramps chest pain and abdominal pain. No rash anywhere else. They also noted a small blister on the left leg. Given this finding the wound care recommended to the patient to be admitted in the hospital for IV antibiotics and the patient was accepted for direct admission.  At his baseline ambulates without any support And is independent for most of his ADL; manages his medication on his own.  Physical Exam on Admission-      Vitals:   01/16/17 1658 01/16/17 1659  BP: (!) 161/56   Pulse: 73   Resp: 20   Temp: 98.1 F (36.7 C)   TempSrc: Oral   SpO2: 100%   Weight:  (!) 232.3 kg (512 lb 1.6 oz)  Height:  6\' 4"  (1.93 m)    General: Alert, Awake and Oriented to Time, Place and Person. Appear in mild distress, affect appropriate Eyes: PERRL, Conjunctiva normal ENT: Oral Mucosa clear moist. Neck: difficult to assess JVD, no Abnormal Mass Or lumps Cardiovascular: S1 and S2 Present, aortic systolic Murmur, Peripheral Pulses Present Respiratory: Bilateral Air entry equal and Decreased, no use of accessory muscle, Clear to Auscultation, no Crackles, no wheezes Abdomen: Bowel Sound present, Soft and  no tenderness Skin: right leg redness with warmth, no tendernedd, no other Rash, no induration Extremities: bilateral Pedal edema, no calf tenderness Neurologic: Grossly no focal neuro deficit. Bilaterally Equal motor strength   Discharge Instructions  Discharge Instructions    Diet - low sodium heart healthy    Complete by:  As  directed    Discharge instructions    Complete by:  As directed    We have prescribed 2 antibiotics take them twice a day as prescribed.  Please follow up with your primary care doctor in 1-2 weeks, and wound care clinic.  Also continue your previous wound dressings as we are told by outpatient wound care clinic.   Face-to-face encounter (required for Medicare/Medicaid patients)    Complete by:  As directed    I Xzavior Reinig E Providencia Hottenstein certify that this patient is under my care and that I, or a nurse practitioner or physician's assistant working with me, had a face-to-face encounter that meets the physician face-to-face encounter requirements with this patient on 01/18/2017. The encounter with the patient was in whole, or in part for the following medical condition(s) which is the primary reason for home health care (List medical condition): Draining wounds, lymphedema, Morbid obesity.   The encounter with the patient was in whole, or in part, for the following medical condition, which is the primary reason for home health care:  Wound care   I certify that, based on my findings, the following services are medically necessary home health services:  Nursing   Reason for Medically Necessary Home Health Services:  Skilled Nursing- Complex Wound Care   My clinical findings support the need for the above services:  OTHER SEE COMMENTS   Further, I certify that my clinical findings support that this patient is homebound due to:  Open/draining pressure/stasis ulcer   Home Health    Complete by:  As directed    To provide the following care/treatments:   [place] [facility]     Increase activity slowly    Complete by:  As directed      Allergies as of 01/18/2017   No Known Allergies     Medication List    STOP taking these medications   phenylephrine 10 MG Tabs tablet Commonly known as:  SUDAFED PE     TAKE these medications   acetaminophen 500 MG tablet Commonly known as:  TYLENOL Take  1,000 mg by mouth every 6 (six) hours as needed for headache.   amoxicillin-clavulanate 875-125 MG tablet Commonly known as:  AUGMENTIN Take 1 tablet by mouth 2 (two) times daily.   aspirin 81 MG EC tablet Take 1 tablet (81 mg total) by mouth daily.   atorvastatin 20 MG tablet Commonly known as:  LIPITOR Take 1 tablet (20 mg total) by mouth at bedtime.   blood glucose meter kit and supplies Kit Dispense based on patient and insurance preference. Use up to four times daily as directed. (FOR ICD-9 250.00, 250.01).   DEXILANT 60 MG capsule Generic drug:  dexlansoprazole Take 60 mg by mouth daily.   doxycycline 100 MG capsule Commonly known as:  VIBRAMYCIN Take 1 capsule (100 mg total) by mouth 2 (two) times daily.   furosemide 40 MG tablet Commonly known as:  LASIX Take 40 mg by mouth daily.   lactulose (encephalopathy) 10 GM/15ML Soln Commonly known as:  CHRONULAC Take 45 mls by mouth 3 times a day   loratadine 10 MG tablet Commonly known as:  CLARITIN Take  10 mg by mouth daily as needed for allergies.   multivitamin with minerals Tabs tablet Take 1 tablet by mouth daily.   rifaximin 550 MG Tabs tablet Commonly known as:  XIFAXAN Take 1 tablet (550 mg total) by mouth 2 (two) times daily.   sertraline 50 MG tablet Commonly known as:  ZOLOFT Take 1 tablet by mouth daily.      Follow-up Information    Wound Care clinic. Schedule an appointment as soon as possible for a visit in 4 day(s).        Lillard Anes, NP. Schedule an appointment as soon as possible for a visit in 1 week(s).   Specialty:  Family Medicine Contact information: [place] [address] [phone]          Subjective: No complaints today. No chest pain or shortness of breath no fevers no chills no increasing lower extremity redness or swelling.   Discharge Exam: Vitals:   01/17/17 2153 01/18/17 0549  BP: (!) 131/45 (!) 124/41  Pulse: 76 80  Resp: 20 18  Temp: 98.2 F  (36.8 C) 98.7 F (37.1 C)   Vitals:   01/17/17 2100 01/17/17 2153 01/18/17 0144 01/18/17 0549  BP: (!) 131/45 (!) 131/45  (!) 124/41  Pulse: 76 76  80  Resp: '20 20  18  '$ Temp: 98.2 F (36.8 C) 98.2 F (36.8 C)  98.7 F (37.1 C)  TempSrc: Oral Oral  Oral  SpO2: 99% 99%  95%  Weight:   (!) 227.8 kg (502 lb 4.8 oz)   Height:       General: Pt is alert, awake, not in acute distress, morbidly obese Cardiovascular: RRR, S1/S2 +, no rubs, no gallops Respiratory: Auscultation hard to appreciate breath sounds due to habitus Abdominal: Soft, NT, obese Extremities: Bilateral lower extremity redness and stasis changes. Right leg worse than left with minimal brownish drainage from the wound blisters around ankle and lower 3rd of leg. Ace bandage with wrappings. No tenderness   The results of significant diagnostics from this hospitalization (including imaging, microbiology, ancillary and laboratory) are listed below for reference.    Labs: Basic Metabolic Panel:  Recent Labs Lab 01/16/17 0859 01/16/17 1919 01/17/17 0428 01/18/17 0649  NA 142 139 138 138  K 4.2 3.9 3.5 3.5  CL 104 106 109 107  CO2 '26 27 25 24  '$ GLUCOSE 79 76 82 76  BUN '16 13 12 9  '$ CREATININE 0.82 0.81 0.70 0.77  CALCIUM 8.1* 8.4* 7.9* 7.6*  MG  --   --  1.7  --    Liver Function Tests:  Recent Labs Lab 01/16/17 0859 01/16/17 1919  AST 55* 56*  ALT 24 24  ALKPHOS 91 78  BILITOT 1.4* 1.5*  PROT 6.6 6.3*  ALBUMIN 2.7* 2.2*   CBC:  Recent Labs Lab 01/16/17 0859 01/16/17 1910 01/17/17 0428 01/18/17 0649  WBC 4.2 3.7* 2.8* 3.6*  NEUTROABS 1.1* 1.1*  --   --   HGB  --  11.9* 10.9* 11.1*  HCT 38.2 34.9* 31.8* 31.9*  MCV 97 95.4 94.9 94.4  PLT 105* 83* 74* 72*   Hgb A1c  Recent Labs  01/16/17 0859  HGBA1C 4.7*   Lipid Profile  Recent Labs  01/16/17 0859  CHOL 91*  HDL 38*  LDLCALC 42  TRIG 55  CHOLHDL 2.4   Thyroid function studies  Recent Labs  01/16/17 0859  TSH 3.820    Urinalysis    Component Value Date/Time  COLORURINE YELLOW 11/19/2015 2130   APPEARANCEUR CLEAR 11/19/2015 2130   LABSPEC 1.018 11/19/2015 2130   PHURINE 7.0 11/19/2015 2130   GLUCOSEU NEGATIVE 11/19/2015 2130   HGBUR NEGATIVE 11/19/2015 2130   BILIRUBINUR NEGATIVE 11/19/2015 2130   [place] 11/19/2015 2130   PROTEINUR NEGATIVE 11/19/2015 2130   UROBILINOGEN 1.0 01/28/2015 1933   NITRITE NEGATIVE 11/19/2015 2130   LEUKOCYTESUR NEGATIVE 11/19/2015 2130    Time coordinating discharge: Over 30 minutes  SIGNED:  Bethena Roys, MD  [facility] 01/18/2017, 4:26 PM Pager [phone].  If 7PM-7AM, please contact night-coverage [email or web address] Password [email or web address]

## 2017-01-18 NOTE — Progress Notes (Signed)
[facility]  PROGRESS NOTE  Ryan Burgess [id number] DOB: 12-20-1970 DOA: 01/16/2017 PCP: Lillard Anes, NP   Brief HPI:    46 y.o. male with Past medical history of morbid obesity, chronic lymphedema and hepatic hypertension, Recurrent cellulitis. Presented to wound care center as an outpatient for routine follow-up. He reportedly for last 3 days patient has noted redevelopment of blisters on the right leg with clear, foul-smelling discharge when the rupture. This was associated with increasing redness of the leg. He denies having any fever or chills no nausea no vomiting no leg cramps chest pain and abdominal pain. No rash anywhere else. They also noted a small blister on the left leg. Given this finding the wound care recommended to the patient to be admitted in the hospital for IV antibiotics   Subjective   No complaints today. No chest pain or shortness of breath no fevers no chills no increasing lower extremity redness or swelling. Improving appearance of RLE.   Assessment/Plan:   1. Right leg cellulitis- improving, sedimentation rate 33, CRP less than 0.8.  - Switch IV to PO- Doxycycline 100mg  BID, AUgmentin  - Pt evaluation for d/c needs - teach pt wound care - Resume HHN on discharge - likely D/c am 01/19/2017. - D/c IVF - F/u with wound care and PCP on dsg - WOC recs apprecaited- Continue present plan of care as recommended by the outpatient wound care center with Aquacel to right leg to provide antimicrobial benefits and absorb drainage, foam dressings to protect and promote healing to blistered areas, Kerlix and ace wrap to provide light compression.  2. Liver cirrhosis- continue home regimen of lactulose and rifaximin. Lasix was increased to 60 mg daily.  - Cont Home lasix 40mg  daily with soft BP.  3. Chronic thrombocytopenia- secondary to liver cirrhosis, platelets 74. Stable. 4. Obstructive sleep apnea- continue CPAP  DVT prophylaxis: SCDs  Code Status:  Full code  Family Communication: No family at bedside   Disposition Plan: Home when cellulitis improves   Consultants:  None  Procedures:  None  Antibiotics:   Anti-infectives    Start     Dose/Rate Route Frequency Ordered Stop   01/18/17 2200  doxycycline (VIBRA-TABS) tablet 100 mg     100 mg Oral Every 12 hours 01/18/17 1714     01/18/17 2200  amoxicillin-clavulanate (AUGMENTIN) 875-125 MG per tablet 1 tablet     1 tablet Oral Every 12 hours 01/18/17 1714     01/18/17 0000  doxycycline (VIBRAMYCIN) 100 MG capsule     100 mg Oral 2 times daily 01/18/17 1616 01/30/17 2359   01/18/17 0000  amoxicillin-clavulanate (AUGMENTIN) 875-125 MG tablet     1 tablet Oral 2 times daily 01/18/17 1616 01/30/17 2359   01/17/17 0500  vancomycin (VANCOCIN) 1,500 mg in sodium chloride 0.9 % 500 mL IVPB  Status:  Discontinued     1,500 mg 250 mL/hr over 120 Minutes Intravenous Every 8 hours 01/16/17 2041 01/18/17 1714   01/16/17 2200  rifaximin (XIFAXAN) tablet 550 mg     550 mg Oral 2 times daily 01/16/17 1752     01/16/17 1830  piperacillin-tazobactam (ZOSYN) IVPB 3.375 g  Status:  Discontinued     3.375 g 12.5 mL/hr over 240 Minutes Intravenous Every 8 hours 01/16/17 1757 01/18/17 1714   01/16/17 1830  vancomycin (VANCOCIN) 2,500 mg in sodium chloride 0.9 % 500 mL IVPB     2,500 mg 250 mL/hr over 120 Minutes Intravenous  Once 01/16/17  1805 01/16/17 2243   01/16/17 1800  piperacillin-tazobactam (ZOSYN) IVPB 3.375 g  Status:  Discontinued     3.375 g 100 mL/hr over 30 Minutes Intravenous  Once 01/16/17 1753 01/16/17 1756   01/16/17 1800  vancomycin (VANCOCIN) IVPB 1000 mg/200 mL premix  Status:  Discontinued     1,000 mg 200 mL/hr over 60 Minutes Intravenous  Once 01/16/17 1753 01/16/17 1755      Objective   Vitals:   01/17/17 2153 01/18/17 0144 01/18/17 0549 01/18/17 1652  BP: (!) 131/45  (!) 124/41 (!) 150/54  Pulse: 76  80 77  Resp: 20  18 (!) 24  Temp: 98.2 F (36.8 C)  98.7  F (37.1 C) 97.8 F (36.6 C)  TempSrc: Oral  Oral Oral  SpO2: 99%  95% 100%  Weight:  (!) 227.8 kg (502 lb 4.8 oz)    Height:        Intake/Output Summary (Last 24 hours) at 01/18/17 2015 Last data filed at 01/18/17 1645  Gross per 24 hour  Intake              550 ml  Output             1700 ml  Net            -1150 ml   Filed Weights   01/16/17 1659 01/17/17 0639 01/18/17 0144  Weight: (!) 233.5 kg (514 lb 12.8 oz) (!) 224.1 kg (494 lb) (!) 227.8 kg (502 lb 4.8 oz)     Physical Examination:  General exam: Appears calm and comfortable, morbidly obese. Respiratory system: Clear to auscultation. Respiratory effort normal. Cardiovascular system:  RRR. No  murmurs, rubs, gallops. No pedal edema. GI system: Abdomen is nondistended, soft and nontender. No organomegaly.  Central nervous system. No focal neurological deficits. 5 x 5 power in all extremities. Skin:Bilateral lower extremity redness and stasis changes. Right leg worse than left with minimal brownish drainage from the wound blisters around ankle and lower 3rd of leg. Ace bandage with wrappings. No tenderness. Waht appear to be riptured old blisters. Psychiatry: Alert, oriented x 3.Judgement and insight appear normal. Affect normal.  Data Reviewed: I have personally reviewed following labs and imaging studies  CBG: No results for input(s): GLUCAP in the last 168 hours.  CBC:  Recent Labs Lab 01/16/17 0859 01/16/17 1910 01/17/17 0428 01/18/17 0649  WBC 4.2 3.7* 2.8* 3.6*  NEUTROABS 1.1* 1.1*  --   --   HGB  --  11.9* 10.9* 11.1*  HCT 38.2 34.9* 31.8* 31.9*  MCV 97 95.4 94.9 94.4  PLT 105* 83* 74* 72*    Basic Metabolic Panel:  Recent Labs Lab 01/16/17 0859 01/16/17 1919 01/17/17 0428 01/18/17 0649  NA 142 139 138 138  K 4.2 3.9 3.5 3.5  CL 104 106 109 107  CO2 26 27 25 24   GLUCOSE 79 76 82 76  BUN 16 13 12 9   CREATININE 0.82 0.81 0.70 0.77  CALCIUM 8.1* 8.4* 7.9* 7.6*  MG  --   --  1.7  --      Liver Function Tests:  Recent Labs Lab 01/16/17 0859 01/16/17 1919  AST 55* 56*  ALT 24 24  ALKPHOS 91 78  BILITOT 1.4* 1.5*  PROT 6.6 6.3*  ALBUMIN 2.7* 2.2*   Studies: No results found.  Scheduled Meds: . amoxicillin-clavulanate  1 tablet Oral Q12H  . aspirin EC  81 mg Oral Daily  . atorvastatin  20 mg Oral QHS  .  doxycycline  100 mg Oral Q12H  . furosemide  60 mg Oral Daily  . lactulose  30 g Oral TID  . multivitamin with minerals  1 tablet Oral Daily  . pantoprazole  40 mg Oral Daily  . rifaximin  550 mg Oral BID  . sertraline  50 mg Oral Daily   Time spent: 25 min  Nari Vannatter E Isabella Ida   [facility] Pager [phone]  If 7PM-7AM, please contact night-coverage at [email or web address], Office  [phone]  password [email or web address] 01/18/2017, 8:15 PM  LOS: 2 days

## 2017-01-19 MED ORDER — DOXYCYCLINE HYCLATE 100 MG PO CAPS: 100.0000 mg | ORAL_CAPSULE | Freq: Two times a day (BID) | ORAL | 0 refills | Status: DC

## 2017-01-19 MED ORDER — AMOXICILLIN-POT CLAVULANATE 875-125 MG PO TABS: 1.0000 | ORAL_TABLET | Freq: Two times a day (BID) | ORAL | 0 refills | Status: AC

## 2017-01-19 MED ORDER — AMOXICILLIN-POT CLAVULANATE 875-125 MG PO TABS: 1.0000 | ORAL_TABLET | Freq: Two times a day (BID) | ORAL | 0 refills | Status: DC

## 2017-01-19 MED ORDER — "AQUACEL AG FOAM 4""X4"" EX PADS": 1.0000 [IU] | MEDICATED_PAD | Freq: Every day | CUTANEOUS | 0 refills | Status: DC

## 2017-01-19 MED ORDER — DOXYCYCLINE HYCLATE 100 MG PO CAPS: 100.0000 mg | ORAL_CAPSULE | Freq: Two times a day (BID) | ORAL | 0 refills | Status: AC

## 2017-01-19 NOTE — Progress Notes (Addendum)
Dressing on BLE were changed today per MD order. Wife at bedside watching how to change the dressings.

## 2017-01-19 NOTE — Discharge Summary (Signed)
Physician Discharge Summary  Ryan Burgess FKC:[id number] DOB: 10/10/1971 DOA: 01/16/2017  PCP: Ryan Anes, NP  Admit date: 01/16/2017 Discharge date: 01/19/2017  Admitted From: Home Disposition:  Home   Recommendations for Outpatient Follow-up:  1. Follow up with PCP in 1-2 weeks 2. F/u with wound care clinic this week. 3.   Home Health: Yes Equipment/Devices: DME- Hospital bed  Discharge Condition: Stable  CODE STATUS: Full  Diet recommendation: Heart Healthy  Principal Problem:   Cellulitis of leg, right Active Problems:   HTN (hypertension)   GERD (gastroesophageal reflux disease)   Morbid obesity (HCC)   OSA on CPAP   Thrombocytopenia (HCC)   Hepatic cirrhosis [facility])   Hospital Course-   Right leg cellulitis- with ulceration, likely secondary to lymphedema and morbid obesity. Chronic leukopenia which was stable on admission- WBC- 4.2 on admission, Sedimentation rate 33, CRP less than 0.8.  - Was started on IV vancomycin and Zosyn and discharged on PO antibiotics- doxycycline 100 mg twice a day and Augmentin to complete a total of 14 days of antibiotics.  - Imaging and blood cultures were not done on admission.  - Wound care was consulted- RecsContinue present plan of care as recommended by the outpatient wound care center with Aquacel to right leg to provide antimicrobial benefits and absorb drainage, foam dressings to protect and promote healing to blistered areas, Kerlix and ace wrap to provide light compression.  - Pt will follow-up with the outpatient wound care center after discharge - Resume HHN   Liver cirrhosis- continue home regimen of lactulose and rifaximin. Lasix was increased to 60 mg daily on admission will continue home 40 mg on discharge with's mildly soft blood pressure.  Chronic thrombocytopenia- secondary to liver cirrhosis, platelet 72 on discharge.  Obstructive sleep apnea- continue CPAP   HPI By Ryan Mull, MD: Ryan Burgess a  46 y.o.malewith Past medical history of morbid obesity, chronic lymphedema and hepatic cirrhosis, hypertension, Recurrent cellulitis. Presented to wound care center as an outpatient for routine follow-up. He reportedly for last 3 days patient has noted redevelopment of blisters on the right leg with clear, foul-smelling discharge when the rupture. This was associated with increasing redness of the leg. He denies having any fever or chills no nausea no vomiting no leg cramps chest pain and abdominal pain. No rash anywhere else. They also noted a small blister on the left leg. Given this finding the wound care recommended to the patient to be admitted in the hospital for IV antibiotics and the patient was accepted for direct admission.  At hisbaseline ambulates without any support And is independent for most of hisADL; manages hismedication on hisown.  Physical Exam on Admission-      Vitals:   01/16/17 1658 01/16/17 1659  BP: (!) 161/56   Pulse: 73   Resp: 20   Temp: 98.1 F (36.7 C)   TempSrc: Oral   SpO2: 100%   Weight:  (!) 232.3 kg (512 lb 1.6 oz)  Height:  6' 4" (1.93 m)    General:Alert, Awake and Oriented to Time, Place and Person. Appear in milddistress, affect appropriate Eyes:PERRL, Conjunctiva normal VCB:SWHQ Mucosa clear moist. Neck:difficult to assessJVD, noAbnormal Mass Or lumps Cardiovascular:S1 and S2 Present, aortic systolicMurmur, Peripheral Pulses Present Respiratory:Bilateral Air entry equal and Decreased, nouse of accessory muscle, Clear to Auscultation, noCrackles, nowheezes Abdomen:Bowel Sound present, Soft and notenderness Skin:right legredness with warmth, no tendernedd, no otherRash, no induration Extremities: bilateralPedal edema, nocalf tenderness Neurologic:Grossly no focal neuro  deficit. Bilaterally Equal motor strength  Discharge Instructions  Discharge Instructions    Diet - low sodium heart healthy     Complete by:  As directed    Diet - low sodium heart healthy    Complete by:  As directed    Diet - low sodium heart healthy    Complete by:  As directed    Discharge instructions    Complete by:  As directed    We have prescribed 2 antibiotics take them twice a day as prescribed.  Please follow up with your primary care doctor in 1-2 weeks, and wound care clinic.  Also continue your previous wound dressings as we are told by outpatient wound care clinic.   Face-to-face encounter (required for Medicare/Medicaid patients)    Complete by:  As directed    I Ryan Burgess certify that this patient is under my care and that I, or a nurse practitioner or physician's assistant working with me, had a face-to-face encounter that meets the physician face-to-face encounter requirements with this patient on 01/18/2017. The encounter with the patient was in whole, or in part for the following medical condition(s) which is the primary reason for home health care (List medical condition): Draining wounds, lymphedema, Morbid obesity.   The encounter with the patient was in whole, or in part, for the following medical condition, which is the primary reason for home health care:  Wound care   I certify that, based on my findings, the following services are medically necessary home health services:  Nursing   Reason for Medically Necessary Home Health Services:  Skilled Nursing- Complex Wound Care   My clinical findings support the need for the above services:  OTHER SEE COMMENTS   Further, I certify that my clinical findings support that this patient is homebound due to:  Open/draining pressure/stasis ulcer   For home use only DME Hospital bed    Complete by:  As directed    The above medical condition requires:  Patient requires the ability to reposition frequently   Bed type:  Semi-electric   Home Health    Complete by:  As directed    To provide the following care/treatments:   [place] PT     Increase activity slowly    Complete by:  As directed    Increase activity slowly    Complete by:  As directed    Increase activity slowly    Complete by:  As directed      Allergies as of 01/19/2017   No Known Allergies     Medication List    STOP taking these medications   phenylephrine 10 MG Tabs tablet Commonly known as:  SUDAFED PE     TAKE these medications   acetaminophen 500 MG tablet Commonly known as:  TYLENOL Take 1,000 mg by mouth every 6 (six) hours as needed for headache.   amoxicillin-clavulanate 875-125 MG tablet Commonly known as:  AUGMENTIN Take 1 tablet by mouth 2 (two) times daily.   AQUACEL AG FOAM 4"X4" Pads Apply 1 Units topically daily.   aspirin 81 MG EC tablet Take 1 tablet (81 mg total) by mouth daily.   atorvastatin 20 MG tablet Commonly known as:  LIPITOR Take 1 tablet (20 mg total) by mouth at bedtime.   blood glucose meter kit and supplies Kit Dispense based on patient and insurance preference. Use up to four times daily as directed. (FOR ICD-9 250.00, 250.01).   DEXILANT 60  MG capsule Generic drug:  dexlansoprazole Take 60 mg by mouth daily.   doxycycline 100 MG capsule Commonly known as:  VIBRAMYCIN Take 1 capsule (100 mg total) by mouth 2 (two) times daily.   furosemide 40 MG tablet Commonly known as:  LASIX Take 40 mg by mouth daily.   lactulose (encephalopathy) 10 GM/15ML Soln Commonly known as:  CHRONULAC Take 45 mls by mouth 3 times a day   loratadine 10 MG tablet Commonly known as:  CLARITIN Take 10 mg by mouth daily as needed for allergies.   multivitamin with minerals Tabs tablet Take 1 tablet by mouth daily.   rifaximin 550 MG Tabs tablet Commonly known as:  XIFAXAN Take 1 tablet (550 mg total) by mouth 2 (two) times daily.   sertraline 50 MG tablet Commonly known as:  ZOLOFT Take 1 tablet by mouth daily.            Durable Medical Equipment        Start     Ordered   01/19/17  0000  For home use only DME Hospital bed    Question Answer Comment  The above medical condition requires: Patient requires the ability to reposition frequently   Bed type Semi-electric      01/19/17 1532     Follow-up Information    Wound Care clinic. Schedule an appointment as soon as possible for a visit in 4 day(s).        Ryan Anes, NP. Schedule an appointment as soon as possible for a visit in 1 week(s).   Specialty:  Family Medicine Contact information: [place] [address] [phone]        [place] Follow up.   Specialty:  [place] Why:  HHRN/PT/aide services resumed- agency has been contacted and will contact you to resume services Contact information: [address] [place] [address] [phone]        [facility] Follow up.   Why:  hospital bed arranged- they will contact you for delivery  Contact information: [address] [place] [address] [phone]          No Known Allergies  Consultations:  None    Procedures/Studies: None   Subjective:  Right lower extremity swelling significantly reduced , no pain .   Discharge Exam: Vitals:   01/18/17 2105 01/19/17 0536  BP: (!) 134/48 (!) 124/47  Pulse: 79 78  Resp: 18 18  Temp: 98.2 F (36.8 C) 97.7 F (36.5 C)   Vitals:   01/18/17 0549 01/18/17 1652 01/18/17 2105 01/19/17 0536  BP: (!) 124/41 (!) 150/54 (!) 134/48 (!) 124/47  Pulse: 80 77 79 78  Resp: 18 (!) _0 Temp: 98.7 F (37.1 C) 97.8 F (36.6 C) 98.2 F (36.8 C) 97.7 F (36.5 C)  TempSrc: Oral Oral Oral Oral  SpO2: 95% 100% 94% 96%  Weight:    (!) 190.1 kg (419 lb)  Height:        General: Pt is alert, awake, not in acute distress, morbidly obese  Cardiovascular: RRR, S1/S2 +, no rubs, no gallops Respiratory: CTA bilaterally, no wheezing, no rhonchi Abdominal: Soft, NT, ND, bowel sounds + Extremities: Nonpitting bilateral lower  extremity swelling, chronic stasis venous dermatitis . Clean dressing on right lower extremity.   The results of significant diagnostics from this hospitalization (including imaging, microbiology, ancillary and laboratory) are listed below for reference.  Labs: BNP (last 3 results) No results for input(s): BNP in the last 8760 hours. Basic Metabolic Panel:  Recent Labs Lab 01/16/17 0859 01/16/17 1919 01/17/17 0428 01/18/17 0649  NA 142 139 138 138  K 4.2 3.9 3.5 3.5  CL 104 106 109 107  CO2 _0 GLUCOSE 79 76 82 76  BUN _1 CREATININE 0.82 0.81 0.70 0.77  CALCIUM 8.1* 8.4* 7.9* 7.6*  MG  --   --  1.7  --    Liver Function Tests:  Recent Labs Lab 01/16/17 0859 01/16/17 1919  AST 55* 56*  ALT 24 24  ALKPHOS 91 78  BILITOT 1.4* 1.5*  PROT 6.6 6.3*  ALBUMIN 2.7* 2.2*   CBC:  Recent Labs Lab 01/16/17 0859 01/16/17 1910 01/17/17 0428 01/18/17 0649  WBC 4.2 3.7* 2.8* 3.6*  NEUTROABS 1.1* 1.1*  --   --   HGB  --  11.9* 10.9* 11.1*  HCT 38.2 34.9* 31.8* 31.9*  MCV 97 95.4 94.9 94.4  PLT 105* 83* 74* 72*   Urinalysis    Component Value Date/Time   COLORURINE YELLOW 11/19/2015 2130   APPEARANCEUR CLEAR 11/19/2015 2130   LABSPEC 1.018 11/19/2015 2130   PHURINE 7.0 11/19/2015 2130   [place] 11/19/2015 2130   HGBUR NEGATIVE 11/19/2015 2130   BILIRUBINUR NEGATIVE 11/19/2015 2130   KETONESUR NEGATIVE 11/19/2015 2130   PROTEINUR NEGATIVE 11/19/2015 2130   UROBILINOGEN 1.0 01/28/2015 1933   NITRITE NEGATIVE 11/19/2015 2130   LEUKOCYTESUR NEGATIVE 11/19/2015 2130    Time coordinating discharge: Over 30 minutes  SIGNED:  Bethena Roys, MD  [facility] 01/19/2017, 8:43 PM Pager [phone]  If 7PM-7AM, please contact night-coverage [email or web address] Password [email or web address]

## 2017-01-19 NOTE — Care Management Note (Signed)
Case Management Note Marvetta Gibbons RN, BSN Unit 2W-Case Manager [phone]  Patient Details  Name: Ryan Burgess MRN: [id number] Date of Birth: Nov 10, 1971  Subjective/Objective:    Pt admitted with cellulitis                Action/Plan: PTA pt lived at home with wife- was active with [facility] for HHRN/PT- spoke with pt and wife who would like to continue [facility] services with [facility]- orders have been placed - pt also interested in hospital bed for home- DME order placed by MD- have notified Jerm[place] with [facility] for DME need - bed will be delivered to home if approved by insurance-  Call made to [place] with [facility] for resumption of [facility] services.   Expected Discharge Date:  01/19/17               Expected Discharge Plan:  [place]  In-House Referral:     Discharge planning Services  CM Consult  Post Acute Care Choice:  Durable Medical Equipment, Home Health Choice offered to:  Patient, Spouse  DME Arranged:  Hospital bed DME Agency:  [place]:  RN, PT, Nurse's Aide [place] Agency:  [facility] ([facility])  Status of Service:  Completed, signed off  If discussed at [employer] of Stay Meetings, dates discussed:    Discharge Disposition: Home with home health   Additional Comments:  Dawayne Patricia, RN 01/19/2017, 3:40 PM

## 2017-01-19 NOTE — Progress Notes (Addendum)
Patient was discharged home with home health by MD order; discharged instructions  review and give to patient with care notes; IV DIC;  patient will be escorted to the car by RN via wheelchair.

## 2017-01-21 ENCOUNTER — Other Ambulatory Visit: Payer: Self-pay | Admitting: Adult Health

## 2017-01-21 DIAGNOSIS — E559 Vitamin D deficiency, unspecified: Secondary | ICD-10-CM

## 2017-01-21 MED ORDER — VITAMIN D (ERGOCALCIFEROL) 1.25 MG (50000 UNIT) PO CAPS: 50000.0000 [IU] | ORAL_CAPSULE | ORAL | 0 refills | Status: DC

## 2017-01-21 NOTE — Progress Notes (Unsigned)
ergo 

## 2017-01-22 ENCOUNTER — Telehealth: Payer: Self-pay | Admitting: Adult Health

## 2017-01-22 ENCOUNTER — Other Ambulatory Visit: Payer: Self-pay

## 2017-01-22 MED ORDER — VITAMIN D (ERGOCALCIFEROL) 1.25 MG (50000 UNIT) PO CAPS: 50000.0000 [IU] | ORAL_CAPSULE | ORAL | 0 refills | Status: DC

## 2017-01-22 NOTE — Telephone Encounter (Signed)
Home health provider needs nurse/ doctor to call [phone] ask for Annamarie Dawley to approve:   1 visit for 5 weeks per order & possibly a  (2 visit weekly if needed). --glh

## 2017-01-23 ENCOUNTER — Encounter: Payer: BLUE CROSS/BLUE SHIELD | Admitting: Surgery

## 2017-01-23 DIAGNOSIS — Z79899 Other long term (current) drug therapy: Secondary | ICD-10-CM | POA: Diagnosis not present

## 2017-01-23 DIAGNOSIS — I10 Essential (primary) hypertension: Secondary | ICD-10-CM | POA: Diagnosis not present

## 2017-01-23 DIAGNOSIS — Z7982 Long term (current) use of aspirin: Secondary | ICD-10-CM | POA: Diagnosis not present

## 2017-01-23 DIAGNOSIS — L03115 Cellulitis of right lower limb: Secondary | ICD-10-CM | POA: Diagnosis not present

## 2017-01-23 DIAGNOSIS — K7460 Unspecified cirrhosis of liver: Secondary | ICD-10-CM | POA: Diagnosis not present

## 2017-01-23 DIAGNOSIS — R7303 Prediabetes: Secondary | ICD-10-CM | POA: Diagnosis not present

## 2017-01-23 DIAGNOSIS — Z6841 Body Mass Index (BMI) 40.0 and over, adult: Secondary | ICD-10-CM | POA: Diagnosis not present

## 2017-01-23 DIAGNOSIS — G4730 Sleep apnea, unspecified: Secondary | ICD-10-CM | POA: Diagnosis not present

## 2017-01-23 DIAGNOSIS — F329 Major depressive disorder, single episode, unspecified: Secondary | ICD-10-CM | POA: Diagnosis not present

## 2017-01-23 DIAGNOSIS — E785 Hyperlipidemia, unspecified: Secondary | ICD-10-CM | POA: Diagnosis not present

## 2017-01-23 DIAGNOSIS — I890 Lymphedema, not elsewhere classified: Secondary | ICD-10-CM | POA: Diagnosis not present

## 2017-01-24 NOTE — Progress Notes (Signed)
ZACHARY, FRAYSER ([id number]) Visit Report for 01/23/2017 Arrival Information Details Patient Name: Ryan Burgess, Ryan Burgess. Date of Service: 01/23/2017 12:30 PM Medical Record Number: [id number] Patient Account Number: [id number] Date of Birth/Sex: 1971-02-23 (46 y.o. Male) Treating RN: Montey Hora Primary Care Ishaan Villamar: BESS, KATY Other Clinician: Referring Lisa Milian: BESS, KATY Treating Vasil Juhasz/Extender: Frann Rider in Treatment: 3 Visit Information History Since Last Visit Added or deleted any medications: No Patient Arrived: Ambulatory Any new allergies or adverse reactions: No Arrival Time: 12:36 Had a fall or experienced change in No Accompanied By: self activities of daily living that may affect Transfer Assistance: None risk of falls: Patient Identification Verified: Yes Signs or symptoms of abuse/neglect since last No Secondary Verification Process Yes visito Completed: Hospitalized since last visit: No Patient Has Alerts: Yes Has Dressing in Place as Prescribed: Yes Patient Alerts: DMII Has Compression in Place as Prescribed: Yes Pain Present Now: No Electronic Signature(s) Signed: 01/23/2017 5:50:12 PM By: Montey Hora Entered By: Montey Hora on 01/23/2017 12:36:56 Ryan Burgess ([id number]) -------------------------------------------------------------------------------- Encounter Discharge Information Details Patient Name: Ryan Burgess Date of Service: 01/23/2017 12:30 PM Medical Record Number: [id number] Patient Account Number: [id number] Date of Birth/Sex: 12-28-1970 (46 y.o. Male) Treating RN: Montey Hora Primary Care Basya Casavant: BESS, KATY Other Clinician: Referring Orris Perin: BESS, KATY Treating Tahjai Schetter/Extender: Frann Rider in Treatment: 3 Encounter Discharge Information Items Discharge Pain Level: 0 Discharge Condition: Stable Ambulatory Status: Ambulatory Discharge Destination: Home Transportation: Private  Auto Accompanied By: self Schedule Follow-up Appointment: Yes Medication Reconciliation completed and provided to Patient/Care No Jaila Schellhorn: Provided on Clinical Summary of Care: 01/23/2017 Form Type Recipient Paper Patient TC Electronic Signature(s) Signed: 01/23/2017 2:14:01 PM By: Montey Hora Previous Signature: 01/23/2017 1:42:43 PM Version By: Ruthine Dose Entered By: Montey Hora on 01/23/2017 14:14:01 Ryan Burgess ([id number]) -------------------------------------------------------------------------------- Lower Extremity Assessment Details Patient Name: Ryan Burgess Date of Service: 01/23/2017 12:30 PM Medical Record Number: [id number] Patient Account Number: [id number] Date of Birth/Sex: July 05, 1971 (46 y.o. Male) Treating RN: Montey Hora Primary Care Verlena Marlette: BESS, KATY Other Clinician: Referring Dashawn Golda: BESS, KATY Treating Shrihaan Porzio/Extender: Frann Rider in Treatment: 3 Edema Assessment Assessed: [Left: No] [Right: No] Edema: [Left: Yes] [Right: Yes] Calf Left: Right: Point of Measurement: 36 cm From Medial Instep cm 70.5 cm Ankle Left: Right: Point of Measurement: 12 cm From Medial Instep cm 44 cm Vascular Assessment Pulses: Dorsalis Pedis Palpable: [Right:Yes] Posterior Tibial Extremity colors, hair growth, and conditions: Extremity Color: [Right:Red] Hair Growth on Extremity: [Right:No] Temperature of Extremity: [Right:Warm] Capillary Refill: [Right:< 3 seconds] Electronic Signature(s) Signed: 01/23/2017 5:50:12 PM By: Montey Hora Entered By: Montey Hora on 01/23/2017 12:54:19 Ryan Burgess ([id number]) -------------------------------------------------------------------------------- Multi Wound Chart Details Patient Name: Ryan Burgess Date of Service: 01/23/2017 12:30 PM Medical Record Number: [id number] Patient Account Number: [id number] Date of Birth/Sex: 1971-11-09 (46 y.o. Male) Treating RN: Montey Hora Primary Care Lilianne Delair: BESS, KATY Other Clinician: Referring Aero Drummonds: BESS, KATY Treating Leilynn Pilat/Extender: Frann Rider in Treatment: 3 Vital Signs Height(in): 77 Pulse(bpm): 77 Weight(lbs): 495 Blood Pressure 151/67 (mmHg): Body Mass Index(BMI): 59 Temperature(F): 98.1 Respiratory Rate 18 (breaths/min): Photos: [N/A:N/A] Wound Location: Right Lower Leg - N/A N/A Circumfernential Wounding Event: Gradually Appeared N/A N/A Primary Etiology: Lymphedema N/A N/A Secondary Etiology: Diabetic Wound/Ulcer of N/A N/A the Lower Extremity Comorbid History: Lymphedema, Sleep N/A N/A Apnea, Cirrhosis , Type II Diabetes Date Acquired: 12/16/2016 N/A N/A Weeks of Treatment: 3 N/A N/A Wound Status: Open N/A N/A Measurements L x W x D [id number]  N/A N/A (cm) Area (cm) : 34.558 N/A N/A Volume (cm) : 3.456 N/A N/A % Reduction in Area: 76.70% N/A N/A % Reduction in Volume: 76.70% N/A N/A Classification: Partial Thickness N/A N/A HBO Classification: Grade 1 N/A N/A Exudate Amount: Large N/A N/A Exudate Type: Serous N/A N/A Exudate Color: amber N/A N/A TIMITHY, DOHN ([id number]) Foul Odor After Yes N/A N/A Cleansing: Odor Anticipated Due to No N/A N/A Product Use: Wound Margin: Flat and Intact N/A N/A Granulation Amount: Large (67-100%) N/A N/A Necrotic Amount: None Present (0%) N/A N/A Exposed Structures: Fascia: No N/A N/A Fat Layer (Subcutaneous Tissue) Exposed: No Tendon: No Muscle: No Joint: No Bone: No Limited to Skin Breakdown Epithelialization: None N/A N/A Periwound Skin Texture: Scarring: Yes N/A N/A Excoriation: No Induration: No Callus: No Crepitus: No Rash: No Periwound Skin Maceration: No N/A N/A Moisture: Dry/Scaly: No Periwound Skin Color: Rubor: Yes N/A N/A Atrophie Blanche: No Cyanosis: No Ecchymosis: No Erythema: No Hemosiderin Staining: No Mottled: No Pallor: No Temperature: No Abnormality N/A N/A Tenderness on Yes  N/A N/A Palpation: Wound Preparation: Ulcer Cleansing: Other: N/A N/A soap and water Topical Anesthetic Applied: None Treatment Notes Electronic Signature(s) Signed: 01/23/2017 1:11:20 PM By: Christin Fudge MD, FACS Entered By: Christin Fudge on 01/23/2017 13:11:19 Ryan Burgess ([id number]) -------------------------------------------------------------------------------- [place] Details Patient Name: Ryan Burgess Date of Service: 01/23/2017 12:30 PM Medical Record Number: [id number] Patient Account Number: [id number] Date of Birth/Sex: October 06, 1971 (46 y.o. Male) Treating RN: Montey Hora Primary Care Sheyanne Munley: BESS, KATY Other Clinician: Referring Miciah Covelli: BESS, KATY Treating Lori Popowski/Extender: Frann Rider in Treatment: 3 Active Inactive ` Abuse / Safety / Falls / Self Care Management Nursing Diagnoses: Impaired physical mobility Potential for falls Goals: Patient will remain injury free Date Initiated: 01/02/2017 Target Resolution Date: 04/03/2017 Goal Status: Active Interventions: Assess fall risk on admission and as needed Notes: ` Orientation to the Wound Care Program Nursing Diagnoses: Knowledge deficit related to the wound healing center program Goals: Patient/caregiver will verbalize understanding of the [place] Program Date Initiated: 01/02/2017 Target Resolution Date: 04/03/2017 Goal Status: Active Interventions: Provide education on orientation to the wound center Notes: ` Wound/Skin Impairment Nursing Diagnoses: Impaired tissue integrity JAYLIN, BLANE ([id number]) Goals: Patient/caregiver will verbalize understanding of skin care regimen Date Initiated: 01/02/2017 Target Resolution Date: 04/03/2017 Goal Status: Active Ulcer/skin breakdown will have a volume reduction of 30% by week 4 Date Initiated: 01/02/2017 Target Resolution Date: 04/03/2017 Goal Status: Active Ulcer/skin breakdown will have a volume  reduction of 50% by week 8 Date Initiated: 01/02/2017 Target Resolution Date: 04/03/2017 Goal Status: Active Ulcer/skin breakdown will have a volume reduction of 80% by week 12 Date Initiated: 01/02/2017 Target Resolution Date: 04/03/2017 Goal Status: Active Ulcer/skin breakdown will heal within 14 weeks Date Initiated: 01/02/2017 Target Resolution Date: 04/03/2017 Goal Status: Active Interventions: Assess patient/caregiver ability to obtain necessary supplies Assess patient/caregiver ability to perform ulcer/skin care regimen upon admission and as needed Assess ulceration(s) every visit Notes: Electronic Signature(s) Signed: 01/23/2017 5:50:12 PM By: Montey Hora Entered By: Montey Hora on 01/23/2017 13:07:24 Ryan Burgess ([id number]) -------------------------------------------------------------------------------- Pain Assessment Details Patient Name: Ryan Burgess Date of Service: 01/23/2017 12:30 PM Medical Record Number: [id number] Patient Account Number: [id number] Date of Birth/Sex: July 21, 1971 (46 y.o. Male) Treating RN: Montey Hora Primary Care Daquarius Dubeau: BESS, KATY Other Clinician: Referring Virgel Haro: Lillard Anes Treating Wessley Emert/Extender: Frann Rider in Treatment: 3 Active Problems Location of Pain Severity and Description of Pain Patient Has Paino No Site  Locations Pain Management and Medication Current Pain Management: Notes Topical or injectable lidocaine is offered to patient for acute pain when surgical debridement is performed. If needed, Patient is instructed to use over the counter pain medication for the following 24-48 hours after debridement. Wound care MDs do not prescribed pain medications. Patient has chronic pain or uncontrolled pain. Patient has been instructed to make an appointment with their Primary Care Physician for pain management. Electronic Signature(s) Signed: 01/23/2017 5:50:12 PM By: Montey Hora Entered By: Montey Hora  on 01/23/2017 12:37:55 Ryan Burgess ([id number]) -------------------------------------------------------------------------------- Patient/Caregiver Education Details Patient Name: Ryan Burgess Date of Service: 01/23/2017 12:30 PM Medical Record Number: [id number] Patient Account Number: [id number] Date of Birth/Gender: Nov 19, 1971 (46 y.o. Male) Treating RN: Montey Hora Primary Care Physician: BESS, KATY Other Clinician: Referring Physician: Lillard Anes Treating Physician/Extender: Frann Rider in Treatment: 3 Education Assessment Education Provided To: Patient Education Topics Provided Venous: Handouts: Other: compression as ordered on left leg when available Methods: Explain/Verbal Responses: State content correctly Electronic Signature(s) Signed: 01/23/2017 5:50:12 PM By: Montey Hora Entered By: Montey Hora on 01/23/2017 14:14:23 Ryan Burgess ([id number]) -------------------------------------------------------------------------------- Wound Assessment Details Patient Name: Ryan Burgess Date of Service: 01/23/2017 12:30 PM Medical Record Number: [id number] Patient Account Number: [id number] Date of Birth/Sex: 06-15-1971 (46 y.o. Male) Treating RN: Montey Hora Primary Care Xaiden Fleig: BESS, KATY Other Clinician: Referring Mysha Peeler: BESS, KATY Treating Chantele Corado/Extender: Frann Rider in Treatment: 3 Wound Status Wound Number: 1 Primary Lymphedema Etiology: Wound Location: Right Lower Leg - Circumfernential Secondary Diabetic Wound/Ulcer of the Lower Etiology: Extremity Wounding Event: Gradually Appeared Wound Status: Open Date Acquired: 12/16/2016 Comorbid Lymphedema, Sleep Apnea, Weeks Of Treatment: 3 History: Cirrhosis , Type II Diabetes Clustered Wound: No Photos Wound Measurements Length: (cm) 4 Width: (cm) 11 Depth: (cm) 0.1 Area: (cm) 34.558 Volume: (cm) 3.456 % Reduction in Area: 76.7% % Reduction in Volume:  76.7% Epithelialization: None Tunneling: No Undermining: No Wound Description Classification: Partial Thickness Foul Odor A Diabetic Severity (Wagner): Grade 1 Due to Prod Wound Margin: Flat and Intact Slough/Fibr Exudate Amount: Large Exudate Type: Serous Exudate Color: amber fter Cleansing: Yes uct Use: No ino No Wound Bed Granulation Amount: Large (67-100%) Exposed Structure Necrotic Amount: None Present (0%) Fascia Exposed: No Fat Layer (Subcutaneous Tissue) Exposed: No [place], Ayomikun B. ([id number]) Tendon Exposed: No Muscle Exposed: No Joint Exposed: No Bone Exposed: No Limited to Skin Breakdown Periwound Skin Texture Texture Color No Abnormalities Noted: No No Abnormalities Noted: No Callus: No Atrophie Blanche: No Crepitus: No Cyanosis: No Excoriation: No Ecchymosis: No Induration: No Erythema: No Rash: No Hemosiderin Staining: No Scarring: Yes Mottled: No Pallor: No Moisture Rubor: Yes No Abnormalities Noted: No Dry / Scaly: No Temperature / Pain Maceration: No Temperature: No Abnormality Tenderness on Palpation: Yes Wound Preparation Ulcer Cleansing: Other: soap and water, Topical Anesthetic Applied: None Treatment Notes Wound #1 (Right, Circumferential Lower Leg) 1. Cleansed with: Cleanse wound with antibacterial soap and water 4. Dressing Applied: Aquacel Ag 5. Secondary Dressing Applied ABD Pad 7. Secured with 4 Layer Compression System - Bilateral Notes xtrasorb Engineer, maintenance) Signed: 01/23/2017 5:50:12 PM By: Montey Hora Entered By: Montey Hora on 01/23/2017 13:08:20 Ryan Burgess ([id number]) -------------------------------------------------------------------------------- [place] Details Patient Name: Ryan Burgess Date of Service: 01/23/2017 12:30 PM Medical Record Number: [id number] Patient Account Number: [id number] Date of Birth/Sex: Dec 03, 1970 (45 y.o. Male) Treating RN: Montey Hora Primary Care  Ashley Montminy: BESS, Valetta Fuller Other Clinician: Referring Teila Skalsky: BESS, KATY Treating Stephan Draughn/Extender: Frann Rider  in Treatment: 3 Vital Signs Time Taken: 12:37 Temperature (F): 98.1 Height (in): 77 Pulse (bpm): 77 Weight (lbs): 495 Respiratory Rate (breaths/min): 18 Body Mass Index (BMI): 58.7 Blood Pressure (mmHg): 151/67 Reference Range: 80 - 120 mg / dl Electronic Signature(s) Signed: 01/23/2017 5:50:12 PM By: Montey Hora Entered By: Montey Hora on 01/23/2017 12:38:52

## 2017-01-24 NOTE — Progress Notes (Signed)
LIJAH, Burgess ([id number]) Visit Report for 01/23/2017 Chief Complaint Document Details Patient Name: Ryan Burgess, Ryan Burgess. Date of Service: 01/23/2017 12:30 PM Medical Record Number: [id number] Patient Account Number: [id number] Date of Birth/Sex: 1971-08-22 (46 y.o. Male) Treating RN: Montey Hora Primary Care Provider: BESS, KATY Other Clinician: Referring Provider: BESS, KATY Treating Provider/Extender: Frann Rider in Treatment: 3 Information Obtained from: Patient Chief Complaint Patient presents to the wound care center for a consult due non healing wound to the right lower extremity with extensive swelling over the last several years Electronic Signature(s) Signed: 01/23/2017 1:11:25 PM By: Christin Fudge MD, FACS Entered By: Christin Fudge on 01/23/2017 13:11:24 Ryan Burgess ([id number]) -------------------------------------------------------------------------------- HPI Details Patient Name: Ryan Burgess Date of Service: 01/23/2017 12:30 PM Medical Record Number: [id number] Patient Account Number: [id number] Date of Birth/Sex: 1971-04-20 (46 y.o. Male) Treating RN: Montey Hora Primary Care Provider: BESS, KATY Other Clinician: Referring Provider: BESS, KATY Treating Provider/Extender: Frann Rider in Treatment: 3 History of Present Illness Location: bilateral lower extremity swelling with weeping and drainage from the right lower extremity Quality: Patient reports experiencing heaviness to affected area(s). Severity: Patient states wound are getting worse. Duration: Patient has had the wound for > 3 months prior to seeking treatment at the wound center Timing: Pain in wound is Intermittent (comes and goes Context: The wound appeared gradually over time Modifying Factors: Other treatment(s) tried include:recently admitted to the hospital with cellulitis and sepsis Associated Signs and Symptoms: Patient reports having increase discharge. HPI  Description: 46 year old gentleman who was recently hospitalized for cellulitis of the right calf, lymphedema, morbid obesity and depression has been on clindamycin, and was referred to [place] for management of cellulitis and lymphedema. Past medical history significant for diabetes mellitus, TIA, edema, morbid obesity, sleep apnea, lymphedema and depression. He is not a smoker. he was recently admitted to the hospital between January 18 and 12/23/2016 for sepsis due to cellulitis of the right leg with hypertension, morbid obesity, lymphedema, acute kidney injury, hepatic cirrhosis and flulike symptoms. during his hospital course he was given vancomycin and Zosyn. he was discharged home on oral clindamycin. He is homebound at present and has not been working. 01/09/2017 -- the patient has not yet heard back from the lymphedema clinic and I have urged him to be in contact with them. The compression wraps which are going to be custom-made have been scheduled for this coming Tuesday. 01/16/2017 -- the patient has developed a lot of redness swelling and perfuse amount of discharge since yesterday and though he is not febrile he has had significant change in his physical findings. As noted he was admitted recently between January 18 and January 22 of 2018 at [facility] for a similar problem 01/23/2017 -- he was admitted to [facility] between February 15 and 01/19/2017 and treated for cellulitis of the right leg. he is also known to have chronic leukopenia and chronic thrombocytopenia possibly due to his liver cirrhosis. He was placed on IV vancomycin and Zosyn and discharged home on by mouth doxycycline and Augmentin for a 14 day course. Electronic Signature(s) Signed: 01/23/2017 1:12:58 PM By: Christin Fudge MD, FACS Entered By: Christin Fudge on 01/23/2017 13:12:58 Ryan Burgess ([id number]) -------------------------------------------------------------------------------- Physical Exam  Details Patient Name: Ryan Burgess Date of Service: 01/23/2017 12:30 PM Medical Record Number: [id number] Patient Account Number: [id number] Date of Birth/Sex: 07-05-1971 (46 y.o. Male) Treating RN: Montey Hora Primary Care Provider: BESS, KATY Other Clinician: Referring Provider: BESS, KATY Treating Provider/Extender: Con Memos,  Deionna Marcantonio Weeks in Treatment: 3 Constitutional . Pulse regular. Respirations normal and unlabored. Afebrile. . Eyes Nonicteric. Reactive to light. Ears, Nose, Mouth, and Throat Lips, teeth, and gums WNL.Marland Kitchen Moist mucosa without lesions. Neck supple and nontender. No palpable supraclavicular or cervical adenopathy. Normal sized without goiter. Respiratory WNL. No retractions.. Breath sounds WNL, No rubs, rales, rhonchi, or wheeze.. Cardiovascular Heart rhythm and rate regular, no murmur or gallop.. Pedal Pulses WNL. No clubbing, cyanosis or edema. Chest Breasts symmetical and no nipple discharge.. Breast tissue WNL, no masses, lumps, or tenderness.. Lymphatic No adneopathy. No adenopathy. No adenopathy. Musculoskeletal Adexa without tenderness or enlargement.. Digits and nails w/o clubbing, cyanosis, infection, petechiae, ischemia, or inflammatory conditions.. Integumentary (Hair, Skin) No suspicious lesions. No crepitus or fluctuance. No peri-wound warmth or erythema. No masses.Marland Kitchen Psychiatric Judgement and insight Intact.. No evidence of depression, anxiety, or agitation.. Notes there is significant increase of lymphedema on the right lower extremity and there is remnants of his cellulitis still present. he also has micro-ulcerations and weeping of his right lower extremity. The left leg looks fairly good and continues to have lymphedema. Electronic Signature(s) Signed: 01/23/2017 1:16:24 PM By: Christin Fudge MD, FACS Previous Signature: 01/23/2017 1:13:29 PM Version By: Christin Fudge MD, FACS Entered By: Christin Fudge on 01/23/2017 13:16:23 Ryan Burgess ([id number]) -------------------------------------------------------------------------------- Physician Orders Details Patient Name: Ryan Burgess Date of Service: 01/23/2017 12:30 PM Medical Record Number: [id number] Patient Account Number: [id number] Date of Birth/Sex: March 19, 1971 (46 y.o. Male) Treating RN: Montey Hora Primary Care Provider: BESS, KATY Other Clinician: Referring Provider: BESS, KATY Treating Provider/Extender: Frann Rider in Treatment: 3 Verbal / Phone Orders: No Diagnosis Coding Wound Cleansing Wound #1 Right,Circumferential Lower Leg o Clean wound with Normal Saline. o May shower with protection. Primary Wound Dressing Wound #1 Right,Circumferential Lower Leg o Aquacel Ag - to any areas that are draining if needed Secondary Dressing Wound #1 Right,Circumferential Lower Leg o ABD pad - if needed to any areas that are draining o XtraSorb Dressing Change Frequency Wound #1 Right,Circumferential Lower Leg o Other: - Twice weekly - once by [facility] and once at [facility] Follow-up Appointments Wound #1 Right,Circumferential Lower Leg o Return Appointment in 1 week. Edema Control Wound #1 Right,Circumferential Lower Leg o 4 Layer Compression System - Bilateral - r/t extensive lymphedema o Patient to wear own compression stockings - on left leg once available Additional Orders / Instructions Wound #1 Right,Circumferential Lower Leg o Increase protein intake. o Other: - please add vitamin a, vitamin c and zinc supplements to your diet Home Health Wound #1 Right,Circumferential Lower Leg o [place] Visits MAXYM, JIMENO ([id number] Nurse may visit PRN to address patientos wound care needs. o FACE TO FACE ENCOUNTER: MEDICARE and MEDICAID PATIENTS: I certify that this patient is under my care and that I had a face-to-face encounter that meets the physician face-to-face encounter  requirements with this patient on this date. The encounter with the patient was in whole or in part for the following MEDICAL CONDITION: (primary reason for [address]) MEDICAL NECESSITY: I certify, that based on my findings, NURSING services are a medically necessary home health service. HOME BOUND STATUS: I certify that my clinical findings support that this patient is homebound (i.e., Due to illness or injury, pt requires aid of supportive devices such as crutches, cane, wheelchairs, walkers, the use of special transportation or the assistance of another person to leave their place of residence. There is a normal inability  to leave the home and doing so requires considerable and taxing effort. Other absences are for medical reasons / religious services and are infrequent or of short duration when for other reasons). o If current dressing causes regression in wound condition, may D/C ordered dressing product/s and apply Normal Saline Moist Dressing daily until next [place] / Other MD appointment. [place] of regression in wound condition at [phone]. o Please direct any NON-WOUND related issues/requests for orders to patient's Primary Care Physician Consults o Infectious Disease Electronic Signature(s) Signed: 01/23/2017 4:29:50 PM By: Christin Fudge MD, FACS Signed: 01/23/2017 5:50:12 PM By: Montey Hora Entered By: Montey Hora on 01/23/2017 13:10:38 Ryan Burgess ([id number]) -------------------------------------------------------------------------------- Problem List Details Patient Name: Ryan Burgess Date of Service: 01/23/2017 12:30 PM Medical Record Number: [id number] Patient Account Number: [id number] Date of Birth/Sex: [birth year] (46 y.o. Male) Treating RN: Montey Hora Primary Care Provider: BESS, KATY Other Clinician: Referring Provider: BESS, KATY Treating Provider/Extender: Frann Rider in Treatment:  3 Active Problems ICD-10 Encounter Code Description Active Date Diagnosis I89.0 Lymphedema, not elsewhere classified 01/02/2017 Yes L03.115 Cellulitis of right lower limb 01/02/2017 Yes E66.01 Morbid (severe) obesity due to excess calories 01/02/2017 Yes R73.03 Prediabetes 01/02/2017 Yes Inactive Problems Resolved Problems Electronic Signature(s) Signed: 01/23/2017 1:11:10 PM By: Christin Fudge MD, FACS Entered By: Christin Fudge on 01/23/2017 13:11:10 Ryan Burgess ([id number]) -------------------------------------------------------------------------------- Progress Note Details Patient Name: Ryan Burgess Date of Service: 01/23/2017 12:30 PM Medical Record Number: [id number] Patient Account Number: [id number] Date of Birth/Sex: 25-May-1971 (46 y.o. Male) Treating RN: Montey Hora Primary Care Provider: BESS, KATY Other Clinician: Referring Provider: BESS, KATY Treating Provider/Extender: Frann Rider in Treatment: 3 Subjective Chief Complaint Information obtained from Patient Patient presents to the wound care center for a consult due non healing wound to the right lower extremity with extensive swelling over the last several years History of Present Illness (HPI) The following HPI elements were documented for the patient's wound: Location: bilateral lower extremity swelling with weeping and drainage from the right lower extremity Quality: Patient reports experiencing heaviness to affected area(s). Severity: Patient states wound are getting worse. Duration: Patient has had the wound for > 3 months prior to seeking treatment at the wound center Timing: Pain in wound is Intermittent (comes and goes Context: The wound appeared gradually over time Modifying Factors: Other treatment(s) tried include:recently admitted to the hospital with cellulitis and sepsis Associated Signs and Symptoms: Patient reports having increase discharge. 46 year old gentleman who was recently  hospitalized for cellulitis of the right calf, lymphedema, morbid obesity and depression has been on clindamycin, and was referred to [place] for management of cellulitis and lymphedema. Past medical history significant for diabetes mellitus, TIA, edema, morbid obesity, sleep apnea, lymphedema and depression. He is not a smoker. he was recently admitted to the hospital between January 18 and 12/23/2016 for sepsis due to cellulitis of the right leg with hypertension, morbid obesity, lymphedema, acute kidney injury, hepatic cirrhosis and flulike symptoms. during his hospital course he was given vancomycin and Zosyn. he was discharged home on oral clindamycin. He is homebound at present and has not been working. 01/09/2017 -- the patient has not yet heard back from the lymphedema clinic and I have urged him to be in contact with them. The compression wraps which are going to be custom-made have been scheduled for this coming Tuesday. 01/16/2017 -- the patient has developed a lot of redness swelling and perfuse amount of discharge since yesterday and  though he is not febrile he has had significant change in his physical findings. As noted he was admitted recently between January 18 and January 22 of 2018 at [facility] for a similar problem 01/23/2017 -- [facility]19/2017 and treated for cellulitis of the right leg. he is also known to have chronic leukopenia and chronic thrombocytopenia possibly due to his liver cirrhosis. He was placed on IV vancomycin and Zosyn and discharged home on by mouth doxycycline and Augmentin for a 14 day course. BERYL, TAVIZON B. ([id number]) Objective Constitutional Pulse regular. Respirations normal and unlabored. Afebrile. Vitals Time Taken: 12:37 PM, Height: 77 in, Weight: 495 lbs, BMI: 58.7, Temperature: 98.1 F, Pulse: 77 bpm, Respiratory Rate: 18 breaths/min, Blood Pressure: 151/67 mmHg. Eyes Nonicteric. Reactive to  light. Ears, Nose, Mouth, and Throat Lips, teeth, and gums WNL.Marland Kitchen Moist mucosa without lesions. Neck supple and nontender. No palpable supraclavicular or cervical adenopathy. Normal sized without goiter. Respiratory WNL. No retractions.. Breath sounds WNL, No rubs, rales, rhonchi, or wheeze.. Cardiovascular Heart rhythm and rate regular, no murmur or gallop.. Pedal Pulses WNL. No clubbing, cyanosis or edema. Chest Breasts symmetical and no nipple discharge.. Breast tissue WNL, no masses, lumps, or tenderness.. Lymphatic No adneopathy. No adenopathy. No adenopathy. Musculoskeletal Adexa without tenderness or enlargement.. Digits and nails w/o clubbing, cyanosis, infection, petechiae, ischemia, or inflammatory conditions.Marland Kitchen Psychiatric Judgement and insight Intact.. No evidence of depression, anxiety, or agitation.. General Notes: there is significant increase of lymphedema on the right lower extremity and there is remnants of his cellulitis still present. he also has micro-ulcerations and weeping of his right lower extremity. The left leg looks fairly good and continues to have lymphedema. Integumentary (Hair, Skin) No suspicious lesions. No crepitus or fluctuance. No peri-wound warmth or erythema. No masses.Marland Kitchen MARRIO, KWOLEK B. ([id number]) Wound #1 status is Open. Original cause of wound was Gradually Appeared. The wound is located on the Right,Circumferential Lower Leg. The wound measures 4cm length x 11cm width x 0.1cm depth; 34.558cm^2 area and 3.456cm^3 volume. The wound is limited to skin breakdown. There is no tunneling or undermining noted. There is a large amount of serous drainage noted. The wound margin is flat and intact. There is large (67-100%) granulation within the wound bed. There is no necrotic tissue within the wound bed. The periwound skin appearance exhibited: Scarring, Rubor. The periwound skin appearance did not exhibit: Callus, Crepitus, Excoriation, Induration,  Rash, Dry/Scaly, Maceration, Atrophie Blanche, Cyanosis, Ecchymosis, Hemosiderin Staining, Mottled, Pallor, Erythema. Periwound temperature was noted as No Abnormality. The periwound has tenderness on palpation. Assessment Active Problems ICD-10 I89.0 - Lymphedema, not elsewhere classified L03.115 - Cellulitis of right lower limb E66.01 - Morbid (severe) obesity due to excess calories R73.03 - Prediabetes this 46 year old morbidly obese gentleman with massive stage III lymphedema both lower extremities has had cellulitis on 2 different occasions necessitating hospital admission the most recent being last week. After review I have recommended: 1. Silver alginate and 4-layer compression to the right lower extremity and appropriate padding and 4-layer compression to the left lower extremity. 2. Complete his course of antibiotics as dictated by his hospital admission 3. At this stage I would like to refer him to Dr. Adrian Prows of infectious disease for an opinion regarding long-term therapy of this gentleman's recurrent cellulitis with leukopenia and thrombocytopenia 4. Await clearing up of his ulceration before sending him to the lymphedema clinic Plan Wound Cleansing: Wound #1 Right,Circumferential Lower Leg: Clean wound with  Normal Saline. May shower with protection. Primary Wound Dressing: Wound #1 Right,Circumferential Lower Leg: Aquacel Ag - to any areas that are draining if needed MORRIS, BERGSCHNEIDER B. ([id number]) Secondary Dressing: Wound #1 Right,Circumferential Lower Leg: ABD pad - if needed to any areas that are draining XtraSorb Dressing Change Frequency: Wound #1 Right,Circumferential Lower Leg: Other: - Twice weekly - once by [facility] and once at [facility] Follow-up Appointments: Wound #1 Right,Circumferential Lower Leg: Return Appointment in 1 week. Edema Control: Wound #1 Right,Circumferential Lower Leg: 4 Layer Compression System - Bilateral - r/t extensive  lymphedema Patient to wear own compression stockings - on left leg once available Additional Orders / Instructions: Wound #1 Right,Circumferential Lower Leg: Increase protein intake. Other: - please add vitamin a, vitamin c and zinc supplements to your diet Home Health: Wound #1 Right,Circumferential Lower Leg: [place] Nurse may visit PRN to address patient s wound care needs. FACE TO FACE ENCOUNTER: MEDICARE and MEDICAID PATIENTS: I certify that this patient is under my care and that I had a face-to-face encounter that meets the physician face-to-face encounter requirements with this patient on this date. The encounter with the patient was in whole or in part for the following MEDICAL CONDITION: (primary reason for [place]) MEDICAL NECESSITY: I certify, that based on my findings, NURSING services are a medically necessary home health service. HOME BOUND STATUS: I certify that my clinical findings support that this patient is homebound (i.e., Due to illness or injury, pt requires aid of supportive devices such as crutches, cane, wheelchairs, walkers, the use of special transportation or the assistance of another person to leave their place of residence. There is a normal inability to leave the home and doing so requires considerable and taxing effort. Other absences are for medical reasons / religious services and are infrequent or of short duration when for other reasons). If current dressing causes regression in wound condition, may D/C ordered dressing product/s and apply Normal Saline Moist Dressing daily until next [place] / Other MD appointment. [place] of regression in wound condition at [phone]. Please direct any NON-WOUND related issues/requests for orders to patient's Primary Care Physician Consults ordered were: Infectious Disease this 46 year old morbidly obese gentleman with massive stage III  lymphedema both lower extremities has had cellulitis on 2 different occasions necessitating hospital admission the most recent being last week. After review I have recommended: 1. Silver alginate and 4-layer compression to the right lower extremity and appropriate padding and 4-layer compression to the left lower extremity. BEORN, PORTMAN B. ([id number]) 2. Complete his course of antibiotics as dictated by his hospital admission 3. At this stage I would like to refer him to Dr. Adrian Prows of infectious disease for an opinion regarding long-term therapy of this gentleman's recurrent cellulitis with leukopenia and thrombocytopenia 4. Await clearing up of his ulceration before sending him to the lymphedema clinic Electronic Signature(s) Signed: 01/23/2017 1:16:39 PM By: Christin Fudge MD, FACS Previous Signature: 01/23/2017 1:15:51 PM Version By: Christin Fudge MD, FACS Entered By: Christin Fudge on 01/23/2017 13:16:39 Ryan Burgess ([id number]) -------------------------------------------------------------------------------- [place] Details Patient Name: Ryan Burgess Date of Service: 01/23/2017 Medical Record Number: [id number] Patient Account Number: [id number] Date of Birth/Sex: 1971-04-10 (46 y.o. Male) Treating RN: Montey Hora Primary Care Provider: BESS, KATY Other Clinician: Referring Provider: Lillard Anes Treating Provider/Extender: Christin Fudge Service Line: Outpatient Weeks in Treatment: 3 Diagnosis Coding ICD-10 Codes Code Description I89.0 Lymphedema, not elsewhere classified L03.115 Cellulitis  of right lower limb E66.01 Morbid (severe) obesity due to excess calories R73.03 Prediabetes Facility Procedures CPT4: Description Modifier Quantity Code [id number] BILATERAL: Application of multi-layer venous compression 1 system; leg (below knee), including ankle and foot. Physician Procedures CPT4 Code: [id number] Description: [id number] - WC PHYS LEVEL 3 - EST PT  ICD-10 Description Diagnosis I89.0 Lymphedema, not elsewhere classified L03.115 Cellulitis of right lower limb E66.01 Morbid (severe) obesity due to excess calori R73.03 Prediabetes Modifier: es Quantity: 1 Electronic Signature(s) Signed: 01/23/2017 2:13:21 PM By: Montey Hora Signed: 01/23/2017 4:29:50 PM By: Christin Fudge MD, FACS Previous Signature: 01/23/2017 1:16:52 PM Version By: Christin Fudge MD, FACS Entered By: Montey Hora on 01/23/2017 14:13:20

## 2017-01-24 NOTE — Telephone Encounter (Signed)
Number is not correct 

## 2017-01-26 NOTE — Telephone Encounter (Signed)
Home Health tried to call. The number in the message was not correct.

## 2017-01-27 ENCOUNTER — Ambulatory Visit (INDEPENDENT_AMBULATORY_CARE_PROVIDER_SITE_OTHER): Payer: BLUE CROSS/BLUE SHIELD | Admitting: Adult Health

## 2017-01-27 ENCOUNTER — Encounter: Payer: Self-pay | Admitting: Adult Health

## 2017-01-27 VITALS — BP 137/78 | HR 75 | Temp 98.3°F | Ht 76.0 in | Wt >= 6400 oz

## 2017-01-27 DIAGNOSIS — I10 Essential (primary) hypertension: Secondary | ICD-10-CM

## 2017-01-27 DIAGNOSIS — L03116 Cellulitis of left lower limb: Secondary | ICD-10-CM

## 2017-01-27 DIAGNOSIS — L03115 Cellulitis of right lower limb: Secondary | ICD-10-CM

## 2017-01-27 MED ORDER — SACCHAROMYCES BOULARDII 250 MG PO CAPS: 250.0000 mg | ORAL_CAPSULE | Freq: Two times a day (BID) | ORAL | 0 refills | Status: AC

## 2017-01-27 NOTE — Assessment & Plan Note (Signed)
Continue current ABX and Wound Care per specialist directions. Disability application question forwarded to practice director (how can PCP assist in applying for disability aid).

## 2017-01-27 NOTE — Assessment & Plan Note (Signed)
Continue Furosemide 40 mg daily. Continue to reduce sodium.

## 2017-01-27 NOTE — Telephone Encounter (Signed)
Annamarie Dawley, RN advised pt may have twice weekly PRN home health visits per Lillard Anes, NP.  Charyl Bigger, CMA

## 2017-01-27 NOTE — Assessment & Plan Note (Signed)
Follow healthy eating plan.  Increase regular movement as tolerated. After cellulitis is resolved, will place referral to Bariatric clinic.

## 2017-01-27 NOTE — Progress Notes (Signed)
Subjective:    Patient ID: Ryan Burgess, male    DOB: 1971/04/01, 46 y.o.   MRN: [id number]  HPI:  Ms. Ryan Burgess presents for f/u after hospitalization for bil lower extremity cellulitis. He denies pain or copious drainage and has home health care RN visits 1-2 x's week and once weekly visits with Wound Care clinic.  He reports that a referral to ID was placed and he is awaiting an appt.  He is compliant on all medications and denies fever/night sweats/CP/dyspnea.  He is able to perform ADLs, however with some difficulty.  Due to frequency of medical appt's he is unable to work and requests assistance with applying for disability benefits (his sister is a Education officer, museum and is familiar with application process).   Patient Care Team    Relationship Specialty Notifications Start End  Odella Aquas, NP PCP - General Family Medicine  01/16/17   Christin Fudge, MD Consulting Physician Surgery  01/16/17   Arta Silence, MD Consulting Physician Gastroenterology  01/16/17     Patient Active Problem List   Diagnosis Date Noted  . Cellulitis of leg, left 01/16/2017  . Cellulitis of leg, right 12/23/2016  . AKI (acute kidney injury) ([place]) 12/19/2016  . Hyponatremia 12/19/2016  . Pressure injury of skin 12/19/2016  . Encephalopathy, hepatic ([place]) 11/20/2015  . Hepatic cirrhosis ([place]) 11/20/2015  . Thrombocytopenia ([place])   . Disorientation 11/19/2015  . Stroke or transient ischemic attack (TIA) diagnosed during current admission 11/19/2015  . OSA on CPAP 05/02/2015  . Hemorrhagic cystitis 01/29/2015  . Sepsis ([place]) 01/29/2015  . Flank pain 01/29/2015  . Sinus tachycardia 01/29/2015  . Lymphedema 01/29/2015  . Morbid obesity with body mass index of 50.0-59.9 in adult [facility]) 01/25/2015  . Obesity hypoventilation syndrome ([place]) 01/25/2015  . Snoring   . High cholesterol   . TIA (transient ischemic attack) 01/10/2015  . Morbid obesity ([place]) 01/10/2015  . Encephalopathy acute 01/09/2015  . HTN  (hypertension) 01/09/2015  . Borderline diabetic 01/09/2015  . GERD (gastroesophageal reflux disease) 01/09/2015  . Acute encephalopathy      Past Medical History:  Diagnosis Date  . Cellulitis   . Cirrhosis, nonalcoholic ([place])   . GERD (gastroesophageal reflux disease)   . Headache    "maybe monthly" (12/19/2016)  . Hepatic encephalopathy ([place]) ~ 2016 X 2   "first time they thought it was a TIA; dx'd hepatic encephalopathy the 2nd time it happened"  . High cholesterol   . Hypertension   . Kidney stones 01/2015   UTI  . Migraine    "none in years; used to have them frequently" (12/19/2016)  . Obesity   . OSA on CPAP   . Pre-diabetes   . Snoring   . Venous stasis      Past Surgical History:  Procedure Laterality Date  . ESOPHAGOGASTRODUODENOSCOPY (EGD) WITH PROPOFOL N/A 07/26/2015   Procedure: ESOPHAGOGASTRODUODENOSCOPY (EGD) WITH PROPOFOL;  Surgeon: Arta Silence, MD;  Location: [facility] ENDOSCOPY;  Service: Endoscopy;  Laterality: N/A;  . TEE WITHOUT CARDIOVERSION N/A 04/03/2015   Procedure: TRANSESOPHAGEAL ECHOCARDIOGRAM (TEE);  Surgeon: Adrian Prows, MD;  Location: [facility] ENDOSCOPY;  Service: Cardiovascular;  Laterality: N/A;  . WISDOM TOOTH EXTRACTION  1990     Family History  Problem Relation Age of Onset  . Leukemia Mother   . Diabetes Father   . Heart disease Maternal Grandmother   . Heart disease Paternal Grandmother   . Dementia Paternal Grandmother   . Diabetes Paternal Grandmother   .  Frontotemporal dementia Paternal Grandmother   . COPD Maternal Grandfather   . Heart disease Paternal Grandfather   . Diabetes Paternal Grandfather      History  Drug Use No     History  Alcohol Use  . 0.6 - 1.2 oz/week  . 1 - 2 Standard drinks or equivalent per week    Comment: once a year     History  Smoking Status  . Never Smoker  Smokeless Tobacco  . Never Used     Outpatient Encounter Prescriptions as of 01/27/2017  Medication Sig  . acetaminophen (TYLENOL) 500  MG tablet Take 1,000 mg by mouth every 6 (six) hours as needed for headache.  Marland Kitchen amoxicillin-clavulanate (AUGMENTIN) 875-125 MG tablet Take 1 tablet by mouth 2 (two) times daily.  Marland Kitchen aspirin EC 81 MG EC tablet Take 1 tablet (81 mg total) by mouth daily.  Marland Kitchen atorvastatin (LIPITOR) 20 MG tablet Take 1 tablet (20 mg total) by mouth at bedtime.  . blood glucose meter kit and supplies KIT Dispense based on patient and insurance preference. Use up to four times daily as directed. (FOR ICD-9 250.00, 250.01).  Marland Kitchen dexlansoprazole (DEXILANT) 60 MG capsule Take 60 mg by mouth daily.  Marland Kitchen doxycycline (VIBRAMYCIN) 100 MG capsule Take 1 capsule (100 mg total) by mouth 2 (two) times daily.  . furosemide (LASIX) 40 MG tablet Take 40 mg by mouth daily.  Marland Kitchen lactulose, encephalopathy, (CHRONULAC) 10 GM/15ML SOLN Take 45 mls by mouth 3 times a day  . loratadine (CLARITIN) 10 MG tablet Take 10 mg by mouth daily as needed for allergies.  . Multiple Vitamin (MULTIVITAMIN WITH MINERALS) TABS tablet Take 1 tablet by mouth daily.  . rifaximin (XIFAXAN) 550 MG TABS tablet Take 1 tablet (550 mg total) by mouth 2 (two) times daily.  . sertraline (ZOLOFT) 50 MG tablet Take 1 tablet by mouth daily.  Marland Kitchen Silver (AQUACEL AG FOAM) 4"X4" PADS Apply 1 Units topically daily.  . Vitamin D, Ergocalciferol, (DRISDOL) 50000 units CAPS capsule Take 1 capsule (50,000 Units total) by mouth every 7 (seven) days.  Marland Kitchen saccharomyces boulardii (FLORASTOR) 250 MG capsule Take 1 capsule (250 mg total) by mouth 2 (two) times daily.   No facility-administered encounter medications on file as of 01/27/2017.     Allergies: Patient has no known allergies.  Body mass index is 61.32 kg/m.  Blood pressure 137/78, pulse 75, temperature 98.3 F (36.8 C), temperature source Oral, height _0  (1.93 m), weight (!) 503 lb 12 oz (228.5 kg).     Review of Systems  Constitutional: Positive for activity change and fatigue. Negative for appetite change, chills,  diaphoresis, fever and unexpected weight change.  Respiratory: Negative for cough and shortness of breath.   Cardiovascular: Positive for leg swelling. Negative for chest pain and palpitations.  Gastrointestinal: Negative for abdominal distention, abdominal pain, constipation, diarrhea, nausea and vomiting.  Endocrine: Negative for cold intolerance, heat intolerance, polydipsia, polyphagia and polyuria.  Genitourinary: Negative for difficulty urinating and flank pain.  Musculoskeletal: Positive for arthralgias, back pain, gait problem, joint swelling and myalgias.  Skin: Positive for wound. Negative for color change, pallor and rash.       Bil lower extremity cellulitis.   Neurological: Negative for dizziness, weakness, light-headedness and headaches.  Psychiatric/Behavioral: Negative for agitation and behavioral problems. The patient is not nervous/anxious.        Objective:   Physical Exam  Constitutional: He is oriented to person, place, and time. He appears well-developed and  well-nourished. No distress.  Eyes: Conjunctivae and EOM are normal. Pupils are equal, round, and reactive to light.  Cardiovascular: Normal rate, regular rhythm, normal heart sounds and intact distal pulses.   Pulmonary/Chest: Effort normal.  Musculoskeletal: He exhibits edema and tenderness.       Right ankle: He exhibits swelling.       Left ankle: He exhibits swelling.       Right lower leg: He exhibits swelling.       Left lower leg: He exhibits swelling.       Right foot: There is decreased range of motion, tenderness and swelling.       Left foot: There is decreased range of motion, tenderness and swelling.  Neurological: He is alert and oriented to person, place, and time. He has normal reflexes.  Skin: Skin is warm. He is not diaphoretic.  Bil lower extremities- active cellulitis.  Wrapped with several layers of gauze, ace wrap. No drainage on dressings noted.  Psychiatric: He has a normal mood and  affect. His behavior is normal. Judgment and thought content normal.  Nursing note and vitals reviewed.         Assessment & Plan:   1. Essential hypertension   2. Morbid obesity (HCC)   3. Cellulitis of leg, right   4. Cellulitis of leg, left     HTN (hypertension) Continue Furosemide 40 mg daily. Continue to reduce sodium.  Morbid obesity ([place]) Follow healthy eating plan.  Increase regular movement as tolerated. After cellulitis is resolved, will place referral to Bariatric clinic.  Cellulitis of leg, right Continue current ABX and Wound Care per specialist directions. Disability application question forwarded to practice director (how can PCP assist in applying for disability aid).  Cellulitis of leg, left Continue current ABX and Wound Care per specialist directions. Disability application question forwarded to practice director (how can PCP assist in applying for disability aid).    FOLLOW-UP:  Return in about 3 months (around 04/26/2017) for Regular Follow Up, Diabetes.

## 2017-01-27 NOTE — Telephone Encounter (Signed)
LVM for Annamarie Dawley to return call.  Charyl Bigger, CMA

## 2017-01-27 NOTE — Patient Instructions (Signed)
Cellulitis, Adult Cellulitis is a skin infection. The infected area is usually red and tender. This condition occurs most often in the arms and lower legs. The infection can travel to the muscles, blood, and underlying tissue and become serious. It is very important to get treated for this condition. What are the causes? Cellulitis is caused by bacteria. The bacteria enter through a break in the skin, such as a cut, burn, insect bite, open sore, or crack. What increases the risk? This condition is more likely to occur in people who:  Have a weak defense system (immune system).  Have open wounds on the skin such as cuts, burns, bites, and scrapes. Bacteria can enter the body through these open wounds.  Are older.  Have diabetes.  Have a type of long-lasting (chronic) liver disease (cirrhosis) or kidney disease.  Use IV drugs. What are the signs or symptoms? Symptoms of this condition include:  Redness, streaking, or spotting on the skin.  Swollen area of the skin.  Tenderness or pain when an area of the skin is touched.  Warm skin.  Fever.  Chills.  Blisters. How is this diagnosed? This condition is diagnosed based on a medical history and physical exam. You may also have tests, including:  Blood tests.  Lab tests.  Imaging tests. How is this treated? Treatment for this condition may include:  Medicines, such as antibiotic medicines or antihistamines.  Supportive care, such as rest and application of cold or warm cloths (cold or warm compresses) to the skin.  Hospital care, if the condition is severe. The infection usually gets better within 1-2 days of treatment. Follow these instructions at home:  Take over-the-counter and prescription medicines only as told by your health care provider.  If you were prescribed an antibiotic medicine, take it as told by your health care provider. Do not stop taking the antibiotic even if you start to feel better.  Drink  enough fluid to keep your urine clear or pale yellow.  Do not touch or rub the infected area.  Raise (elevate) the infected area above the level of your heart while you are sitting or lying down.  Apply warm or cold compresses to the affected area as told by your health care provider.  Keep all follow-up visits as told by your health care provider. This is important. These visits let your health care provider make sure a more serious infection is not developing. Contact a health care provider if:  You have a fever.  Your symptoms do not improve within 1-2 days of starting treatment.  Your bone or joint underneath the infected area becomes painful after the skin has healed.  Your infection returns in the same area or another area.  You notice a swollen bump in the infected area.  You develop new symptoms.  You have a general ill feeling (malaise) with muscle aches and pains. Get help right away if:  Your symptoms get worse.  You feel very sleepy.  You develop vomiting or diarrhea that persists.  You notice red streaks coming from the infected area.  Your red area gets larger or turns dark in color. This information is not intended to replace advice given to you by your health care provider. Make sure you discuss any questions you have with your health care provider. Document Released: 08/28/2005 Document Revised: 03/28/2016 Document Reviewed: 09/27/2015 Elsevier Interactive Patient Education  2017 [employer].  Continue medications as directed. Continue with Wound Care, Home Health, and Infectious Disease  as directed. Return May 2018 for regular primary care follow-up, or sooner if needed.

## 2017-01-28 ENCOUNTER — Encounter: Payer: Self-pay | Admitting: Adult Health

## 2017-01-30 ENCOUNTER — Encounter: Payer: BLUE CROSS/BLUE SHIELD | Attending: Surgery | Admitting: Surgery

## 2017-01-30 DIAGNOSIS — I10 Essential (primary) hypertension: Secondary | ICD-10-CM | POA: Insufficient documentation

## 2017-01-30 DIAGNOSIS — I890 Lymphedema, not elsewhere classified: Secondary | ICD-10-CM | POA: Insufficient documentation

## 2017-01-30 DIAGNOSIS — Z6841 Body Mass Index (BMI) 40.0 and over, adult: Secondary | ICD-10-CM | POA: Insufficient documentation

## 2017-01-30 DIAGNOSIS — K7460 Unspecified cirrhosis of liver: Secondary | ICD-10-CM | POA: Diagnosis not present

## 2017-01-30 DIAGNOSIS — E785 Hyperlipidemia, unspecified: Secondary | ICD-10-CM | POA: Insufficient documentation

## 2017-01-30 DIAGNOSIS — F329 Major depressive disorder, single episode, unspecified: Secondary | ICD-10-CM | POA: Insufficient documentation

## 2017-01-30 DIAGNOSIS — L03115 Cellulitis of right lower limb: Secondary | ICD-10-CM | POA: Diagnosis not present

## 2017-01-30 DIAGNOSIS — R7303 Prediabetes: Secondary | ICD-10-CM | POA: Diagnosis not present

## 2017-01-30 DIAGNOSIS — Z7982 Long term (current) use of aspirin: Secondary | ICD-10-CM | POA: Diagnosis not present

## 2017-01-30 DIAGNOSIS — Z79899 Other long term (current) drug therapy: Secondary | ICD-10-CM | POA: Insufficient documentation

## 2017-01-30 DIAGNOSIS — G4730 Sleep apnea, unspecified: Secondary | ICD-10-CM | POA: Insufficient documentation

## 2017-01-31 NOTE — Progress Notes (Signed)
LALO, MONTON ([id number]) Visit Report for 01/30/2017 Arrival Information Details Patient Name: Ryan Burgess, Ryan Burgess. Date of Service: 01/30/2017 12:30 PM Medical Record Number: [id number] Patient Account Number: [id number] Date of Birth/Sex: [birth year] (46 y.o. Male) Treating RN: Montey Hora Primary Care Jillian Warth: BESS, KATY Other Clinician: Referring Zamire Whitehurst: BESS, KATY Treating Samayra Hebel/Extender: Frann Rider in Treatment: 4 Visit Information History Since Last Visit Added or deleted any medications: No Patient Arrived: Ambulatory Any new allergies or adverse reactions: No Arrival Time: 12:40 Had a fall or experienced change in No Accompanied By: self activities of daily living that may affect Transfer Assistance: None risk of falls: Patient Identification Verified: Yes Signs or symptoms of abuse/neglect since last No Secondary Verification Process Yes visito Completed: Hospitalized since last visit: No Patient Has Alerts: Yes Has Dressing in Place as Prescribed: Yes Patient Alerts: DMII Has Compression in Place as Prescribed: No Pain Present Now: No Electronic Signature(s) Signed: 01/30/2017 4:53:39 PM By: Montey Hora Entered By: Montey Hora on 01/30/2017 12:48:16 Ryan Burgess ([id number]) -------------------------------------------------------------------------------- Encounter Discharge Information Details Patient Name: Ryan Burgess Date of Service: 01/30/2017 12:30 PM Medical Record Number: [id number] Patient Account Number: [id number] Date of Birth/Sex: [birth year] (46 y.o. Male) Treating RN: Montey Hora Primary Care Myrel Rappleye: BESS, KATY Other Clinician: Referring Jeziel Hoffmann: BESS, KATY Treating Karli Wickizer/Extender: Frann Rider in Treatment: 4 Encounter Discharge Information Items Discharge Pain Level: 0 Discharge Condition: Stable Ambulatory Status: Ambulatory Discharge Destination: Home Transportation: Private  Auto Accompanied By: self Schedule Follow-up Appointment: Yes Medication Reconciliation completed and provided to Patient/Care No Nioka Thorington: Provided on Clinical Summary of Care: 01/30/2017 Form Type Recipient Paper Patient TC Electronic Signature(s) Signed: 01/30/2017 2:29:24 PM By: Montey Hora Previous Signature: 01/30/2017 1:36:22 PM Version By: Ruthine Dose Entered By: Montey Hora on 01/30/2017 14:29:24 Ryan Burgess ([id number]) -------------------------------------------------------------------------------- Lower Extremity Assessment Details Patient Name: Ryan Burgess Date of Service: 01/30/2017 12:30 PM Medical Record Number: [id number] Patient Account Number: [id number] Date of Birth/Sex: 04-17-1971 (46 y.o. Male) Treating RN: Montey Hora Primary Care Irwin Toran: BESS, KATY Other Clinician: Referring Rayvn Rickerson: BESS, KATY Treating Astor Gentle/Extender: Frann Rider in Treatment: 4 Edema Assessment Assessed: [Left: No] [Right: No] Edema: [Left: Ye] [Right: s] Calf Left: Right: Point of Measurement: 36 cm From Medial Instep cm 64.8 cm Ankle Left: Right: Point of Measurement: 12 cm From Medial Instep cm 40.7 cm Vascular Assessment Pulses: Dorsalis Pedis Palpable: [Right:Yes] Posterior Tibial Popliteal Palpable: [Right:Yes] Extremity colors, hair growth, and conditions: Extremity Color: [Right:Red] Hair Growth on Extremity: [Right:Yes] Temperature of Extremity: [Right:Warm] Capillary Refill: [Right:< 3 seconds] Electronic Signature(s) Signed: 01/30/2017 4:53:39 PM By: Montey Hora Entered By: Montey Hora on 01/30/2017 12:57:04 Ryan Burgess ([id number]) -------------------------------------------------------------------------------- Multi Wound Chart Details Patient Name: Ryan Burgess Date of Service: 01/30/2017 12:30 PM Medical Record Number: [id number] Patient Account Number: [id number] Date of Birth/Sex: 07/12/1971 (46 y.o.  Male) Treating RN: Montey Hora Primary Care Rital Cavey: BESS, KATY Other Clinician: Referring Jermar Colter: BESS, KATY Treating Mykia Holton/Extender: Frann Rider in Treatment: 4 Vital Signs Height(in): 77 Pulse(bpm): 68 Weight(lbs): 495 Blood Pressure 154/58 (mmHg): Body Mass Index(BMI): 59 Temperature(F): 98.4 Respiratory Rate 18 (breaths/min): Photos: [1:No Photos] [N/A:N/A] Wound Location: [1:Right Lower Leg - Circumfernential] [N/A:N/A] Wounding Event: [1:Gradually Appeared] [N/A:N/A] Primary Etiology: [1:Lymphedema] [N/A:N/A] Secondary Etiology: [1:Diabetic Wound/Ulcer of the Lower Extremity] [N/A:N/A] Comorbid History: [1:Lymphedema, Sleep Apnea, Cirrhosis , Type II Diabetes] [N/A:N/A] Date Acquired: [1:12/16/2016] [N/A:N/A] Weeks of Treatment: [1:4] [N/A:N/A] Wound Status: [1:Open] [N/A:N/A] Measurements L x W x D 0.1x0.1x0.1 [N/A:N/A] (cm) Area (  cm) : [1:0.008] [N/A:N/A] Volume (cm) : [1:0.001] [N/A:N/A] % Reduction in Area: [1:100.00%] [N/A:N/A] % Reduction in Volume: 100.00% [N/A:N/A] Classification: [1:Partial Thickness] [N/A:N/A] HBO Classification: [1:Grade 1] [N/A:N/A] Exudate Amount: [1:Large] [N/A:N/A] Exudate Type: [1:Serous] [N/A:N/A] Exudate Color: [1:amber] [N/A:N/A] Foul Odor After [1:Yes] [N/A:N/A] Cleansing: Odor Anticipated Due to No [N/A:N/A] Product Use: Wound Margin: [1:Flat and Intact] [N/A:N/A] Granulation Amount: [1:Large (67-100%)] [N/A:N/A] Necrotic Amount: None Present (0%) N/A N/A Exposed Structures: Fascia: No N/A N/A Fat Layer (Subcutaneous Tissue) Exposed: No Tendon: No Muscle: No Joint: No Bone: No Limited to Skin Breakdown Epithelialization: None N/A N/A Periwound Skin Texture: Scarring: Yes N/A N/A Excoriation: No Induration: No Callus: No Crepitus: No Rash: No Periwound Skin Maceration: No N/A N/A Moisture: Dry/Scaly: No Periwound Skin Color: Rubor: Yes N/A N/A Atrophie Blanche: No Cyanosis:  No Ecchymosis: No Erythema: No Hemosiderin Staining: No Mottled: No Pallor: No Temperature: No Abnormality N/A N/A Tenderness on Yes N/A N/A Palpation: Wound Preparation: Ulcer Cleansing: Other: N/A N/A soap and water Topical Anesthetic Applied: None Treatment Notes Electronic Signature(s) Signed: 01/30/2017 1:10:42 PM By: Christin Fudge MD, FACS Entered By: Christin Fudge on 01/30/2017 13:10:42 Ryan Burgess ([id number]) -------------------------------------------------------------------------------- [place] Details Patient Name: Ryan Burgess Date of Service: 01/30/2017 12:30 PM Medical Record Number: [id number] Patient Account Number: [id number] Date of Birth/Sex: 1971/09/28 (46 y.o. Male) Treating RN: Montey Hora Primary Care Eli Adami: BESS, KATY Other Clinician: Referring Gee Habig: BESS, KATY Treating Rovena Hearld/Extender: Frann Rider in Treatment: 4 Active Inactive ` Abuse / Safety / Falls / Self Care Management Nursing Diagnoses: Impaired physical mobility Potential for falls Goals: Patient will remain injury free Date Initiated: 01/02/2017 Target Resolution Date: 04/03/2017 Goal Status: Active Interventions: Assess fall risk on admission and as needed Notes: ` Orientation to the Wound Care Program Nursing Diagnoses: Knowledge deficit related to the wound healing center program Goals: Patient/caregiver will verbalize understanding of the [facility] Date Initiated: 01/02/2017 Target Resolution Date: 04/03/2017 Goal Status: Active Interventions: Provide education on orientation to the wound center Notes: ` Wound/Skin Impairment Nursing Diagnoses: Impaired tissue integrity Ryan Burgess, Ryan Burgess ([id number]) Goals: Patient/caregiver will verbalize understanding of skin care regimen Date Initiated: 01/02/2017 Target Resolution Date: 04/03/2017 Goal Status: Active Ulcer/skin breakdown will have a volume reduction of  30% by week 4 Date Initiated: 01/02/2017 Target Resolution Date: 04/03/2017 Goal Status: Active Ulcer/skin breakdown will have a volume reduction of 50% by week 8 Date Initiated: 01/02/2017 Target Resolution Date: 04/03/2017 Goal Status: Active Ulcer/skin breakdown will have a volume reduction of 80% by week 12 Date Initiated: 01/02/2017 Target Resolution Date: 04/03/2017 Goal Status: Active Ulcer/skin breakdown will heal within 14 weeks Date Initiated: 01/02/2017 Target Resolution Date: 04/03/2017 Goal Status: Active Interventions: Assess patient/caregiver ability to obtain necessary supplies Assess patient/caregiver ability to perform ulcer/skin care regimen upon admission and as needed Assess ulceration(s) every visit Notes: Electronic Signature(s) Signed: 01/30/2017 4:53:39 PM By: Montey Hora Entered By: Montey Hora on 01/30/2017 13:02:52 Ryan Burgess ([id number]) -------------------------------------------------------------------------------- Pain Assessment Details Patient Name: Ryan Burgess Date of Service: 01/30/2017 12:30 PM Medical Record Number: [id number] Patient Account Number: [id number] Date of Birth/Sex: 02-21-1971 (46 y.o. Male) Treating RN: Montey Hora Primary Care Luree Palla: BESS, KATY Other Clinician: Referring Calem Cocozza: BESS, KATY Treating Daniesha Driver/Extender: Frann Rider in Treatment: 4 Active Problems Location of Pain Severity and Description of Pain Patient Has Paino No Site Locations Pain Management and Medication Current Pain Management: Notes Topical or injectable lidocaine is offered to patient for acute pain when  surgical debridement is performed. If needed, Patient is instructed to use over the counter pain medication for the following 24-48 hours after debridement. Wound care MDs do not prescribed pain medications. Patient has chronic pain or uncontrolled pain. Patient has been instructed to make an appointment with their Primary Care  Physician for pain management. Electronic Signature(s) Signed: 01/30/2017 4:53:39 PM By: Montey Hora Entered By: Montey Hora on 01/30/2017 12:48:23 Ryan Burgess ([id number]) -------------------------------------------------------------------------------- Patient/Caregiver Education Details Patient Name: Ryan Burgess Date of Service: 01/30/2017 12:30 PM Medical Record Number: [id number] Patient Account Number: [id number] Date of Birth/Gender: May 06, 1971 (46 y.o. Male) Treating RN: Montey Hora Primary Care Physician: BESS, KATY Other Clinician: Referring Physician: Lillard Anes Treating Physician/Extender: Frann Rider in Treatment: 4 Education Assessment Education Provided To: Patient Education Topics Provided Venous: Handouts: Other: please keep compression on this week Methods: Explain/Verbal Responses: State content correctly Electronic Signature(s) Signed: 01/30/2017 4:53:39 PM By: Montey Hora Entered By: Montey Hora on 01/30/2017 14:29:48 Ryan Burgess ([id number]) -------------------------------------------------------------------------------- Wound Assessment Details Patient Name: Ryan Burgess Date of Service: 01/30/2017 12:30 PM Medical Record Number: [id number] Patient Account Number: [id number] Date of Birth/Sex: 1971/03/27 (47 y.o. Male) Treating RN: Montey Hora Primary Care Tierre Netto: BESS, KATY Other Clinician: Referring Laymon Stockert: BESS, KATY Treating Nishan Ovens/Extender: Frann Rider in Treatment: 4 Wound Status Wound Number: 1 Primary Lymphedema Etiology: Wound Location: Right Lower Leg - Circumfernential Secondary Diabetic Wound/Ulcer of the Lower Etiology: Extremity Wounding Event: Gradually Appeared Wound Status: Open Date Acquired: 12/16/2016 Comorbid Lymphedema, Sleep Apnea, Weeks Of Treatment: 4 History: Cirrhosis , Type II Diabetes Clustered Wound: No Photos Photo Uploaded By: Montey Hora on 01/30/2017  15:59:14 Wound Measurements Length: (cm) 0.1 Width: (cm) 0.1 Depth: (cm) 0.1 Area: (cm) 0.008 Volume: (cm) 0.001 % Reduction in Area: 100% % Reduction in Volume: 100% Epithelialization: None Tunneling: No Undermining: No Wound Description Classification: Partial Thickness Foul Odor A Diabetic Severity (Wagner): Grade 1 Due to Prod Wound Margin: Flat and Intact Slough/Fibr Exudate Amount: Large Exudate Type: Serous Exudate Color: amber fter Cleansing: Yes uct Use: No ino No Wound Bed Granulation Amount: Large (67-100%) Exposed Structure Necrotic Amount: None Present (0%) Fascia Exposed: No Ryan Burgess, Ryan B. ([id number]) Fat Layer (Subcutaneous Tissue) Exposed: No Tendon Exposed: No Muscle Exposed: No Joint Exposed: No Bone Exposed: No Limited to Skin Breakdown Periwound Skin Texture Texture Color No Abnormalities Noted: No No Abnormalities Noted: No Callus: No Atrophie Blanche: No Crepitus: No Cyanosis: No Excoriation: No Ecchymosis: No Induration: No Erythema: No Rash: No Hemosiderin Staining: No Scarring: Yes Mottled: No Pallor: No Moisture Rubor: Yes No Abnormalities Noted: No Dry / Scaly: No Temperature / Pain Maceration: No Temperature: No Abnormality Tenderness on Palpation: Yes Wound Preparation Ulcer Cleansing: Other: soap and water, Topical Anesthetic Applied: None Treatment Notes Wound #1 (Right, Circumferential Lower Leg) 1. Cleansed with: Cleanse wound with antibacterial soap and water 5. Secondary Dressing Applied ABD Pad 7. Secured with 4 Layer Compression System - Bilateral Electronic Signature(s) Signed: 01/30/2017 4:53:39 PM By: Montey Hora Entered By: Montey Hora on 01/30/2017 13:02:27 Ryan Burgess ([id number]) -------------------------------------------------------------------------------- [place] Details Patient Name: Ryan Burgess Date of Service: 01/30/2017 12:30 PM Medical Record Number:  [id number] Patient Account Number: [id number] Date of Birth/Sex: 19-Nov-1971 (46 y.o. Male) Treating RN: Montey Hora Primary Care Lamari Beckles: BESS, KATY Other Clinician: Referring Joni Norrod: BESS, KATY Treating Dewitt Judice/Extender: Frann Rider in Treatment: 4 Vital Signs Time Taken: 12:48 Temperature (F): 98.4 Height (in): 77 Pulse (bpm): 68 Weight (lbs): 495 Respiratory Rate (  breaths/min): 18 Body Mass Index (BMI): 58.7 Blood Pressure (mmHg): 154/58 Reference Range: 80 - 120 mg / dl Electronic Signature(s) Signed: 01/30/2017 4:53:39 PM By: Montey Hora Entered By: Montey Hora on 01/30/2017 12:49:46

## 2017-01-31 NOTE — Progress Notes (Signed)
Ryan Burgess, Ryan Burgess ([id number]) Visit Report for 01/30/2017 Chief Complaint Document Details Patient Name: Ryan Burgess, Ryan Burgess. Date of Service: 01/30/2017 12:30 PM Medical Record Number: [id number] Patient Account Number: [id number] Date of Birth/Sex: 02-14-1971 (46 y.o. Male) Treating RN: Montey Hora Primary Care Provider: BESS, KATY Other Clinician: Referring Provider: BESS, KATY Treating Provider/Extender: Frann Rider in Treatment: 4 Information Obtained from: Patient Chief Complaint Patient presents to the wound care center for a consult due non healing wound to the right lower extremity with extensive swelling over the last several years Electronic Signature(s) Signed: 01/30/2017 1:10:48 PM By: Christin Fudge MD, FACS Entered By: Christin Fudge on 01/30/2017 13:10:48 Ryan Burgess ([id number]) -------------------------------------------------------------------------------- HPI Details Patient Name: Ryan Burgess Date of Service: 01/30/2017 12:30 PM Medical Record Number: [id number] Patient Account Number: [id number] Date of Birth/Sex: 10-20-1971 (46 y.o. Male) Treating RN: Montey Hora Primary Care Provider: BESS, KATY Other Clinician: Referring Provider: BESS, KATY Treating Provider/Extender: Frann Rider in Treatment: 4 History of Present Illness Location: bilateral lower extremity swelling with weeping and drainage from the right lower extremity Quality: Patient reports experiencing heaviness to affected area(s). Severity: Patient states wound are getting worse. Duration: Patient has had the wound for > 3 months prior to seeking treatment at the wound center Timing: Pain in wound is Intermittent (comes and goes Context: The wound appeared gradually over time Modifying Factors: Other treatment(s) tried include:recently admitted to the hospital with cellulitis and sepsis Associated Signs and Symptoms: Patient reports having increase discharge. HPI  Description: 46 year old gentleman who was recently hospitalized for cellulitis of the right calf, lymphedema, morbid obesity and depression has been on clindamycin, and was referred to [place] for management of cellulitis and lymphedema. Past medical history significant for diabetes mellitus, TIA, edema, morbid obesity, sleep apnea, lymphedema and depression. He is not a smoker. he was recently admitted to the hospital between January 18 and 12/23/2016 for sepsis due to cellulitis of the right leg with hypertension, morbid obesity, lymphedema, acute kidney injury, hepatic cirrhosis and flulike symptoms. during his hospital course he was given vancomycin and Zosyn. he was discharged home on oral clindamycin. He is homebound at present and has not been working. 01/09/2017 -- the patient has not yet heard back from the lymphedema clinic and I have urged him to be in contact with them. The compression wraps which are going to be custom-made have been scheduled for this coming Tuesday. 01/16/2017 -- the patient has developed a lot of redness swelling and perfuse amount of discharge since yesterday and though he is not febrile he has had significant change in his physical findings. As noted he was admitted recently between January 18 and January 22 of 2018 at [facility] for a similar problem 01/23/2017 -- he was admitted to [facility] between February 15 and 01/19/2017 and treated for cellulitis of the right leg. he is also known to have chronic leukopenia and chronic thrombocytopenia possibly due to his liver cirrhosis. He was placed on IV vancomycin and Zosyn and discharged home on by mouth doxycycline and Augmentin for a 14 day course. 01/30/2017 -- he has not yet had his appointment with Dr. Ola Spurr which is scheduled for the middle of March. Completing his course of antibiotics this week Electronic Signature(s) Signed: 01/30/2017 1:11:20 PM By: Christin Fudge MD, FACS Entered By: Christin Fudge on  01/30/2017 13:11:20 Ryan Burgess ([id number]) -------------------------------------------------------------------------------- Physical Exam Details Patient Name: Ryan Burgess Date of Service: 01/30/2017 12:30 PM Medical Record Number: [id number] Patient Account  Number: [id number] Date of Birth/Sex: [birth year] (46 y.o. Male) Treating RN: Montey Hora Primary Care Provider: BESS, KATY Other Clinician: Referring Provider: BESS, KATY Treating Provider/Extender: Frann Rider in Treatment: 4 Constitutional . Pulse regular. Respirations normal and unlabored. Afebrile. . Eyes Nonicteric. Reactive to light. Ears, Nose, Mouth, and Throat Lips, teeth, and gums WNL.Marland Kitchen Moist mucosa without lesions. Neck supple and nontender. No palpable supraclavicular or cervical adenopathy. Normal sized without goiter. Respiratory WNL. No retractions.. Breath sounds WNL, No rubs, rales, rhonchi, or wheeze.. Cardiovascular Heart rhythm and rate regular, no murmur or gallop.. Pedal Pulses WNL. No clubbing, cyanosis or edema. Chest Breasts symmetical and no nipple discharge.. Breast tissue WNL, no masses, lumps, or tenderness.. Lymphatic No adneopathy. No adenopathy. No adenopathy. Musculoskeletal Adexa without tenderness or enlargement.. Digits and nails w/o clubbing, cyanosis, infection, petechiae, ischemia, or inflammatory conditions.. Integumentary (Hair, Skin) No suspicious lesions. No crepitus or fluctuance. No peri-wound warmth or erythema. No masses.Marland Kitchen Psychiatric Judgement and insight Intact.. No evidence of depression, anxiety, or agitation.. Notes lymphedema is about the same and there are microperforations visible but other than that it is healed very well and this week and we will persist with a 4-layer compression Electronic Signature(s) Signed: 01/30/2017 1:12:53 PM By: Christin Fudge MD, FACS Entered By: Christin Fudge on 01/30/2017 13:12:52 Ryan Burgess  ([id number]) -------------------------------------------------------------------------------- Physician Orders Details Patient Name: Ryan Burgess Date of Service: 01/30/2017 12:30 PM Medical Record Number: [id number] Patient Account Number: [id number] Date of Birth/Sex: Dec 10, 1970 (46 y.o. Male) Treating RN: Montey Hora Primary Care Provider: BESS, KATY Other Clinician: Referring Provider: BESS, KATY Treating Provider/Extender: Frann Rider in Treatment: 4 Verbal / Phone Orders: No Diagnosis Coding Wound Cleansing Wound #1 Right,Circumferential Lower Leg o Clean wound with Normal Saline. o May shower with protection. Primary Wound Dressing Wound #1 Right,Circumferential Lower Leg o Aquacel Ag - to any areas that are draining if needed Secondary Dressing Wound #1 Right,Circumferential Lower Leg o ABD pad - if needed to any areas that are draining Dressing Change Frequency Wound #1 Right,Circumferential Lower Leg o Other: - Twice weekly - once by [facility] and once at [facility] Follow-up Appointments Wound #1 Right,Circumferential Lower Leg o Return Appointment in 1 week. Edema Control Wound #1 Right,Circumferential Lower Leg o 4 Layer Compression System - Bilateral - r/t extensive lymphedema o Patient to wear own compression stockings - on left leg once available Additional Orders / Instructions Wound #1 Right,Circumferential Lower Leg o Increase protein intake. o Other: - please add vitamin a, vitamin c and zinc supplements to your diet Home Health Wound #1 Right,Circumferential Lower Leg o [place] Visits - Please make PRN visits to rewrap if patient removes wrap o Home Health Nurse may visit PRN to address patientos wound care needs. Ryan Burgess, Ryan Burgess ([id number]) o FACE TO FACE ENCOUNTER: MEDICARE and MEDICAID PATIENTS: I certify that this patient is under my care and that I had a face-to-face encounter that meets the  physician face-to-face encounter requirements with this patient on this date. The encounter with the patient was in whole or in part for the following MEDICAL CONDITION: (primary reason for [place]) MEDICAL NECESSITY: I certify, that based on my findings, NURSING services are a medically necessary home health service. HOME BOUND STATUS: I certify that my clinical findings support that this patient is homebound (i.e., Due to illness or injury, pt requires aid of supportive devices such as crutches, cane, wheelchairs, walkers, the use of special transportation or the assistance of  another person to leave their place of residence. There is a normal inability to leave the home and doing so requires considerable and taxing effort. Other absences are for medical reasons / religious services and are infrequent or of short duration when for other reasons). o If current dressing causes regression in wound condition, may D/C ordered dressing product/s and apply Normal Saline Moist Dressing daily until next [place] / Other MD appointment. [place] of regression in wound condition at [phone]. o Please direct any NON-WOUND related issues/requests for orders to patient's Primary Care Physician Electronic Signature(s) Signed: 01/30/2017 4:14:08 PM By: Christin Fudge MD, FACS Signed: 01/30/2017 4:53:39 PM By: Montey Hora Entered By: Montey Hora on 01/30/2017 13:04:34 Ryan Burgess ([id number]) -------------------------------------------------------------------------------- Problem List Details Patient Name: Ryan Burgess Date of Service: 01/30/2017 12:30 PM Medical Record Number: [id number] Patient Account Number: [id number] Date of Birth/Sex: 1971-06-02 (46 y.o. Male) Treating RN: Montey Hora Primary Care Provider: BESS, KATY Other Clinician: Referring Provider: BESS, KATY Treating Provider/Extender: Frann Rider in Treatment:  4 Active Problems ICD-10 Encounter Code Description Active Date Diagnosis I89.0 Lymphedema, not elsewhere classified 01/02/2017 Yes L03.115 Cellulitis of right lower limb 01/02/2017 Yes E66.01 Morbid (severe) obesity due to excess calories 01/02/2017 Yes R73.03 Prediabetes 01/02/2017 Yes Inactive Problems Resolved Problems Electronic Signature(s) Signed: 01/30/2017 1:10:38 PM By: Christin Fudge MD, FACS Entered By: Christin Fudge on 01/30/2017 13:10:37 Ryan Burgess ([id number]) -------------------------------------------------------------------------------- Progress Note Details Patient Name: Ryan Burgess Date of Service: 01/30/2017 12:30 PM Medical Record Number: [id number] Patient Account Number: [id number] Date of Birth/Sex: Jul 31, 1971 (46 y.o. Male) Treating RN: Montey Hora Primary Care Provider: BESS, KATY Other Clinician: Referring Provider: BESS, KATY Treating Provider/Extender: Frann Rider in Treatment: 4 Subjective Chief Complaint Information obtained from Patient Patient presents to the wound care center for a consult due non healing wound to the right lower extremity with extensive swelling over the last several years History of Present Illness (HPI) The following HPI elements were documented for the patient's wound: Location: bilateral lower extremity swelling with weeping and drainage from the right lower extremity Quality: Patient reports experiencing heaviness to affected area(s). Severity: Patient states wound are getting worse. Duration: Patient has had the wound for > 3 months prior to seeking treatment at the wound center Timing: Pain in wound is Intermittent (comes and goes Context: The wound appeared gradually over time Modifying Factors: Other treatment(s) tried include:recently admitted to the hospital with cellulitis and sepsis Associated Signs and Symptoms: Patient reports having increase discharge. 46 year old gentleman who was recently  hospitalized for cellulitis of the right calf, lymphedema, morbid obesity and depression has been on clindamycin, and was referred to [place] for management of cellulitis and lymphedema. Past medical history significant for diabetes mellitus, TIA, edema, morbid obesity, sleep apnea, lymphedema and depression. He is not a smoker. he was recently admitted to the hospital between January 18 and 12/23/2016 for sepsis due to cellulitis of the right leg with hypertension, morbid obesity, lymphedema, acute kidney injury, hepatic cirrhosis and flulike symptoms. during his hospital course he was given vancomycin and Zosyn. he was discharged home on oral clindamycin. He is homebound at present and has not been working. 01/09/2017 -- the patient has not yet heard back from the lymphedema clinic and I have urged him to be in contact with them. The compression wraps which are going to be custom-made have been scheduled for this coming Tuesday. 01/16/2017 -- the patient has developed a lot of redness  swelling and perfuse amount of discharge since yesterday and though he is not febrile he has had significant change in his physical findings. As noted he was admitted recently between January 18 and January 22 of 2018 at [facility] for a similar problem 01/23/2017 -- he was admitted to [facility] between February 15 and 01/19/2017 and treated for cellulitis of the right leg. he is also known to have chronic leukopenia and chronic thrombocytopenia possibly due to his liver cirrhosis. He was placed on IV vancomycin and Zosyn and discharged home on by mouth doxycycline and Augmentin for a 14 day course. 01/30/2017 -- he has not yet had his appointment with Dr. Ola Spurr which is scheduled for the middle of Ryan Burgess, Ryan Burgess ([id number]) March. Completing his course of antibiotics this week Objective Constitutional Pulse regular. Respirations normal and unlabored. Afebrile. Vitals Time Taken: 12:48 PM, Height: 77  in, Weight: 495 lbs, BMI: 58.7, Temperature: 98.4 F, Pulse: 68 bpm, Respiratory Rate: 18 breaths/min, Blood Pressure: 154/58 mmHg. Eyes Nonicteric. Reactive to light. Ears, Nose, Mouth, and Throat Lips, teeth, and gums WNL.Marland Kitchen Moist mucosa without lesions. Neck supple and nontender. No palpable supraclavicular or cervical adenopathy. Normal sized without goiter. Respiratory WNL. No retractions.. Breath sounds WNL, No rubs, rales, rhonchi, or wheeze.. Cardiovascular Heart rhythm and rate regular, no murmur or gallop.. Pedal Pulses WNL. No clubbing, cyanosis or edema. Chest Breasts symmetical and no nipple discharge.. Breast tissue WNL, no masses, lumps, or tenderness.. Lymphatic No adneopathy. No adenopathy. No adenopathy. Musculoskeletal Adexa without tenderness or enlargement.. Digits and nails w/o clubbing, cyanosis, infection, petechiae, ischemia, or inflammatory conditions.Marland Kitchen Psychiatric Judgement and insight Intact.. No evidence of depression, anxiety, or agitation.. General Notes: lymphedema is about the same and there are microperforations visible but other than that it is healed very well and this week and we will persist with a 4-layer compression Integumentary (Hair, Skin) No suspicious lesions. No crepitus or fluctuance. No peri-wound warmth or erythema. No masses.Marland Kitchen Ryan Burgess, Ryan B. ([id number]) Wound #1 status is Open. Original cause of wound was Gradually Appeared. The wound is located on the Right,Circumferential Lower Leg. The wound measures 0.1cm length x 0.1cm width x 0.1cm depth; 0.008cm^2 area and 0.001cm^3 volume. The wound is limited to skin breakdown. There is no tunneling or undermining noted. There is a large amount of serous drainage noted. The wound margin is flat and intact. There is large (67-100%) granulation within the wound bed. There is no necrotic tissue within the wound bed. The periwound skin appearance exhibited: Scarring, Rubor. The periwound skin  appearance did not exhibit: Callus, Crepitus, Excoriation, Induration, Rash, Dry/Scaly, Maceration, Atrophie Blanche, Cyanosis, Ecchymosis, Hemosiderin Staining, Mottled, Pallor, Erythema. Periwound temperature was noted as No Abnormality. The periwound has tenderness on palpation. Assessment Active Problems ICD-10 I89.0 - Lymphedema, not elsewhere classified L03.115 - Cellulitis of right lower limb E66.01 - Morbid (severe) obesity due to excess calories R73.03 - Prediabetes Plan Wound Cleansing: Wound #1 Right,Circumferential Lower Leg: Clean wound with Normal Saline. May shower with protection. Primary Wound Dressing: Wound #1 Right,Circumferential Lower Leg: Aquacel Ag - to any areas that are draining if needed Secondary Dressing: Wound #1 Right,Circumferential Lower Leg: ABD pad - if needed to any areas that are draining Dressing Change Frequency: Wound #1 Right,Circumferential Lower Leg: Other: - Twice weekly - once by [facility] and once at [facility] Follow-up Appointments: Wound #1 Right,Circumferential Lower Leg: Return Appointment in 1 week. Edema Control: Wound #1 Right,Circumferential Lower Leg: 4 Layer Compression System - Bilateral -  r/t extensive lymphedema Cabezas, Exzavier B. ([id number]) Patient to wear own compression stockings - on left leg once available Additional Orders / Instructions: Wound #1 Right,Circumferential Lower Leg: Increase protein intake. Other: - please add vitamin a, vitamin c and zinc supplements to your diet Home Health: Wound #1 Right,Circumferential Lower Leg: Continue Home Health Visits - Please make PRN visits to rewrap if patient removes wrap Home Health Nurse may visit PRN to address patient s wound care needs. FACE TO FACE ENCOUNTER: MEDICARE and MEDICAID PATIENTS: I certify that this patient is under my care and that I had a face-to-face encounter that meets the physician face-to-face encounter requirements with this patient on this  date. The encounter with the patient was in whole or in part for the following MEDICAL CONDITION: (primary reason for [place]) MEDICAL NECESSITY: I certify, that based on my findings, NURSING services are a medically necessary home health service. HOME BOUND STATUS: I certify that my clinical findings support that this patient is homebound (i.e., Due to illness or injury, pt requires aid of supportive devices such as crutches, cane, wheelchairs, walkers, the use of special transportation or the assistance of another person to leave their place of residence. There is a normal inability to leave the home and doing so requires considerable and taxing effort. Other absences are for medical reasons / religious services and are infrequent or of short duration when for other reasons). If current dressing causes regression in wound condition, may D/C ordered dressing product/s and apply Normal Saline Moist Dressing daily until next [place] / Other MD appointment. [place] of regression in wound condition at [phone]. Please direct any NON-WOUND related issues/requests for orders to patient's Primary Care Physician After review I have recommended: 1. 4-layer compression to the right lower extremity and appropriate padding and 4-layer compression to the left lower extremity. 2. Complete his course of antibiotics as dictated by his hospital admission 3. At this stage I would like to refer him to Dr. Adrian Prows of infectious disease for an opinion regarding long-term therapy of this gentleman's recurrent cellulitis with leukopenia and thrombocytopenia. consultation still pending. 4. he is ready to have his care transferred to the lymphedema clinic 5. we will discharge him from our services as soon as his care has been taken over by the lymphedema clinic Electronic Signature(s) Signed: 01/30/2017 1:14:30 PM By: Christin Fudge MD, FACS Entered By: Christin Fudge on 01/30/2017 13:14:30 Ryan Burgess ([id number]) -------------------------------------------------------------------------------- [place] Details Patient Name: Ryan Burgess Date of Service: 01/30/2017 Medical Record Number: [id number] Patient Account Number: [id number] Date of Birth/Sex: 05-Oct-1971 (46 y.o. Male) Treating RN: Montey Hora Primary Care Provider: BESS, KATY Other Clinician: Referring Provider: BESS, KATY Treating Provider/Extender: Christin Fudge Service Line: Outpatient Weeks in Treatment: 4 Diagnosis Coding ICD-10 Codes Code Description I89.0 Lymphedema, not elsewhere classified L03.115 Cellulitis of right lower limb E66.01 Morbid (severe) obesity due to excess calories R73.03 Prediabetes Facility Procedures CPT4: Description Modifier Quantity Code [id number] BILATERAL: Application of multi-layer venous compression 1 system; leg (below knee), including ankle and foot. Physician Procedures CPT4 Code: [id number] Description: [id number] - WC PHYS LEVEL 3 - EST PT ICD-10 Description Diagnosis I89.0 Lymphedema, not elsewhere classified L03.115 Cellulitis of right lower limb E66.01 Morbid (severe) obesity due to excess calori R73.03 Prediabetes Modifier: es Quantity: 1 Electronic Signature(s) Signed: 01/30/2017 2:26:24 PM By: Montey Hora Signed: 01/30/2017 4:14:08 PM By: Christin Fudge MD, FACS Previous Signature: 01/30/2017 1:14:43 PM Version By: Christin Fudge MD,  FACS Entered By: Montey Hora on 01/30/2017 [id number]

## 2017-02-06 ENCOUNTER — Encounter: Payer: BLUE CROSS/BLUE SHIELD | Admitting: Surgery

## 2017-02-06 DIAGNOSIS — I890 Lymphedema, not elsewhere classified: Secondary | ICD-10-CM | POA: Diagnosis not present

## 2017-02-07 NOTE — Progress Notes (Signed)
KHYRI, HINZMAN ([id number]) Visit Report for 02/06/2017 Arrival Information Details Patient Name: Ryan Burgess, Ryan Burgess. Date of Service: 02/06/2017 2:30 PM Medical Record Number: [id number] Patient Account Number: [id number] Date of Birth/Sex: 1971/10/29 (46 y.o. Male) Treating RN: Montey Hora Primary Care Fermon Ureta: BESS, KATY Other Clinician: Referring Avanna Sowder: BESS, KATY Treating Darene Nappi/Extender: Frann Rider in Treatment: 5 Visit Information History Since Last Visit Added or deleted any medications: No Patient Arrived: Ambulatory Any new allergies or adverse reactions: No Arrival Time: 14:39 Had a fall or experienced change in No Accompanied By: self activities of daily living that may affect Transfer Assistance: None risk of falls: Patient Identification Verified: Yes Signs or symptoms of abuse/neglect since last No Secondary Verification Process Yes visito Completed: Hospitalized since last visit: No Patient Has Alerts: Yes Has Dressing in Place as Prescribed: No Patient Alerts: DMII Has Compression in Place as Prescribed: No Pain Present Now: No Electronic Signature(s) Signed: 02/06/2017 5:22:00 PM By: Montey Hora Entered By: Montey Hora on 02/06/2017 14:39:53 Ryan Burgess ([id number]) -------------------------------------------------------------------------------- Encounter Discharge Information Details Patient Name: Ryan Burgess Date of Service: 02/06/2017 2:30 PM Medical Record Number: [id number] Patient Account Number: [id number] Date of Birth/Sex: 05/08/1971 (46 y.o. Male) Treating RN: Montey Hora Primary Care Cherl Gorney: BESS, KATY Other Clinician: Referring Devon Kingdon: BESS, KATY Treating Kamille Toomey/Extender: Frann Rider in Treatment: 5 Encounter Discharge Information Items Discharge Pain Level: 0 Discharge Condition: Stable Ambulatory Status: Ambulatory Discharge Destination: Home Transportation: Private Auto Accompanied  By: self Schedule Follow-up Appointment: No Medication Reconciliation completed and provided to Patient/Care No Gerardine Peltz: Provided on Clinical Summary of Care: 02/06/2017 Form Type Recipient Paper Patient TC Electronic Signature(s) Signed: 02/06/2017 4:59:53 PM By: Montey Hora Previous Signature: 02/06/2017 3:37:35 PM Version By: Ruthine Dose Entered By: Montey Hora on 02/06/2017 16:59:53 Ryan Burgess ([id number]) -------------------------------------------------------------------------------- Lower Extremity Assessment Details Patient Name: Ryan Burgess Date of Service: 02/06/2017 2:30 PM Medical Record Number: [id number] Patient Account Number: [id number] Date of Birth/Sex: 1971-08-12 (46 y.o. Male) Treating RN: Montey Hora Primary Care Ketrick Matney: BESS, KATY Other Clinician: Referring Kassius Battiste: BESS, KATY Treating Taneika Choi/Extender: Frann Rider in Treatment: 5 Edema Assessment Assessed: [Left: No] [Right: No] Edema: [Left: Ye] [Right: s] Calf Left: Right: Point of Measurement: 36 cm From Medial Instep cm cm Ankle Left: Right: Point of Measurement: 12 cm From Medial Instep cm cm Vascular Assessment Pulses: Dorsalis Pedis Palpable: [Right:Yes] Posterior Tibial Extremity colors, hair growth, and conditions: Extremity Color: [Right:Hyperpigmented] Hair Growth on Extremity: [Right:No] Temperature of Extremity: [Right:Warm] Capillary Refill: [Right:< 3 seconds] Electronic Signature(s) Signed: 02/06/2017 5:22:00 PM By: Montey Hora Entered By: Montey Hora on 02/06/2017 15:03:50 Ryan Burgess ([id number]) -------------------------------------------------------------------------------- Multi Wound Chart Details Patient Name: Ryan Burgess Date of Service: 02/06/2017 2:30 PM Medical Record Number: [id number] Patient Account Number: [id number] Date of Birth/Sex: 1971-07-04 (46 y.o. Male) Treating RN: Montey Hora Primary Care Naod Sweetland:  BESS, KATY Other Clinician: Referring Akon Reinoso: BESS, KATY Treating Rhys Anchondo/Extender: Frann Rider in Treatment: 5 Vital Signs Height(in): 77 Pulse(bpm): 74 Weight(lbs): 495 Blood Pressure 160/65 (mmHg): Body Mass Index(BMI): 59 Temperature(F): 98.2 Respiratory Rate 18 (breaths/min): Photos: [1:No Photos] [N/A:N/A] Wound Location: [1:Right, Circumferential Lower Leg] [N/A:N/A] Wounding Event: [1:Gradually Appeared] [N/A:N/A] Primary Etiology: [1:Lymphedema] [N/A:N/A] Secondary Etiology: [1:Diabetic Wound/Ulcer of the Lower Extremity] [N/A:N/A] Date Acquired: [1:12/16/2016] [N/A:N/A] Weeks of Treatment: [1:5] [N/A:N/A] Wound Status: [1:Open] [N/A:N/A] Measurements L x W x D 0x0x0 [N/A:N/A] (cm) Area (cm) : [1:0] [N/A:N/A] Volume (cm) : [1:0] [N/A:N/A] % Reduction in Area: [1:100.00%] [N/A:N/A] % Reduction  in Volume: 100.00% [N/A:N/A] Classification: [1:Partial Thickness] [N/A:N/A] Periwound Skin Texture: No Abnormalities Noted [N/A:N/A] Periwound Skin [1:No Abnormalities Noted] [N/A:N/A] Moisture: Periwound Skin Color: No Abnormalities Noted [N/A:N/A] Tenderness on [1:No] [N/A:N/A] Treatment Notes Electronic Signature(s) Signed: 02/06/2017 3:11:11 PM By: Christin Fudge MD, FACS [place], Abbott Pao ([id number]) Entered By: Christin Fudge on 02/06/2017 15:11:10 Ryan Burgess ([id number]) -------------------------------------------------------------------------------- [place] Details Patient Name: Ryan Burgess Date of Service: 02/06/2017 2:30 PM Medical Record Number: [id number] Patient Account Number: [id number] Date of Birth/Sex: 1970/12/07 (46 y.o. Male) Treating RN: Montey Hora Primary Care Jaiyden Laur: Lillard Anes Other Clinician: Referring Xandrea Clarey: BESS, KATY Treating Finnis Colee/Extender: Frann Rider in Treatment: 5 Active Inactive Electronic Signature(s) Signed: 02/06/2017 5:22:00 PM By: Montey Hora Entered By:  Montey Hora on 02/06/2017 15:04:06 Ryan Burgess ([id number]) -------------------------------------------------------------------------------- Pain Assessment Details Patient Name: Ryan Burgess Date of Service: 02/06/2017 2:30 PM Medical Record Number: [id number] Patient Account Number: [id number] Date of Birth/Sex: Oct 06, 1971 (46 y.o. Male) Treating RN: Montey Hora Primary Care Kaniesha Barile: BESS, KATY Other Clinician: Referring Messiah Ahr: BESS, KATY Treating Nethaniel Mattie/Extender: Frann Rider in Treatment: 5 Active Problems Location of Pain Severity and Description of Pain Patient Has Paino No Site Locations Pain Management and Medication Current Pain Management: Notes Topical or injectable lidocaine is offered to patient for acute pain when surgical debridement is performed. If needed, Patient is instructed to use over the counter pain medication for the following 24-48 hours after debridement. Wound care MDs do not prescribed pain medications. Patient has chronic pain or uncontrolled pain. Patient has been instructed to make an appointment with their Primary Care Physician for pain management. Electronic Signature(s) Signed: 02/06/2017 5:22:00 PM By: Montey Hora Entered By: Montey Hora on 02/06/2017 14:40:01 Ryan Burgess ([id number]) -------------------------------------------------------------------------------- Patient/Caregiver Education Details Patient Name: Ryan Burgess Date of Service: 02/06/2017 2:30 PM Medical Record Number: [id number] Patient Account Number: [id number] Date of Birth/Gender: [birth year] (46 y.o. Male) Treating RN: Montey Hora Primary Care Physician: BESS, KATY Other Clinician: Referring Physician: Lillard Anes Treating Physician/Extender: Frann Rider in Treatment: 5 Education Assessment Education Provided To: Patient Education Topics Provided Venous: Handouts: Other: compression daily Methods:  Explain/Verbal Responses: State content correctly Electronic Signature(s) Signed: 02/06/2017 5:22:00 PM By: Montey Hora Entered By: Montey Hora on 02/06/2017 17:00:13 Ryan Burgess ([id number]) -------------------------------------------------------------------------------- Wound Assessment Details Patient Name: Ryan Burgess Date of Service: 02/06/2017 2:30 PM Medical Record Number: [id number] Patient Account Number: [id number] Date of Birth/Sex: 10/27/1971 (46 y.o. Male) Treating RN: Montey Hora Primary Care Hayden Mabin: BESS, KATY Other Clinician: Referring Carletha Dawn: BESS, KATY Treating Quinlyn Tep/Extender: Frann Rider in Treatment: 5 Wound Status Wound Number: 1 Primary Etiology: Lymphedema Wound Location: Right, Circumferential Lower Secondary Diabetic Wound/Ulcer of the Lower Leg Etiology: Extremity Wounding Event: Gradually Appeared Wound Status: Open Date Acquired: 12/16/2016 Weeks Of Treatment: 5 Clustered Wound: No Photos Photo Uploaded By: Montey Hora on 02/06/2017 15:53:01 Wound Measurements Length: (cm) 0 % Reduction Width: (cm) 0 % Reduction Depth: (cm) 0 Area: (cm) 0 Volume: (cm) 0 in Area: 100% in Volume: 100% Wound Description Classification: Partial Thickness Periwound Skin Texture Texture Color No Abnormalities Noted: No No Abnormalities Noted: No Moisture No Abnormalities Noted: No Electronic Signature(s) Signed: 02/06/2017 5:22:00 PM By: Tillie Fantasia, Abbott Pao ([id number]) Entered By: Montey Hora on 02/06/2017 14:55:05 Ryan Burgess ([id number]) -------------------------------------------------------------------------------- [place] Details Patient Name: Ryan Burgess Date of Service: 02/06/2017 2:30 PM Medical Record Number: [id number] Patient Account Number: [id number] Date of Birth/Sex: 10/04/1971 (46 y.o. Male) Treating  RN: Montey Hora Primary Care Mareena Cavan: BESS, KATY Other  Clinician: Referring Niccolo Burggraf: BESS, KATY Treating Shawn Dannenberg/Extender: Frann Rider in Treatment: 5 Vital Signs Time Taken: 14:40 Temperature (F): 98.2 Height (in): 77 Pulse (bpm): 74 Weight (lbs): 495 Respiratory Rate (breaths/min): 18 Body Mass Index (BMI): 58.7 Blood Pressure (mmHg): 160/65 Reference Range: 80 - 120 mg / dl Electronic Signature(s) Signed: 02/06/2017 5:22:00 PM By: Montey Hora Entered By: Montey Hora on 02/06/2017 14:42:25

## 2017-02-08 NOTE — Progress Notes (Addendum)
Ryan, Burgess ([id number]) Visit Report for 02/06/2017 Chief Complaint Document Details Patient Name: Ryan Burgess, Ryan Burgess. Date of Service: 02/06/2017 2:30 PM Medical Record Number: [id number] Patient Account Number: [id number] Date of Birth/Sex: [birth year] (46 y.o. Male) Treating RN: Montey Hora Primary Care Provider: BESS, KATY Other Clinician: Referring Provider: BESS, KATY Treating Provider/Extender: Frann Rider in Treatment: 5 Information Obtained from: Patient Chief Complaint Patient presents to the wound care center for a consult due non healing wound to the right lower extremity with extensive swelling over the last several years Electronic Signature(s) Signed: 02/06/2017 3:11:35 PM By: Christin Fudge MD, FACS Previous Signature: 02/06/2017 3:11:22 PM Version By: Christin Fudge MD, FACS Entered By: Christin Fudge on 02/06/2017 15:11:35 Ryan Burgess ([id number]) -------------------------------------------------------------------------------- HPI Details Patient Name: Ryan Burgess Date of Service: 02/06/2017 2:30 PM Medical Record Number: [id number] Patient Account Number: [id number] Date of Birth/Sex: 1970/12/28 (46 y.o. Male) Treating RN: Montey Hora Primary Care Provider: BESS, KATY Other Clinician: Referring Provider: BESS, KATY Treating Provider/Extender: Frann Rider in Treatment: 5 History of Present Illness Location: bilateral lower extremity swelling with weeping and drainage from the right lower extremity Quality: Patient reports experiencing heaviness to affected area(s). Severity: Patient states wound are getting worse. Duration: Patient has had the wound for > 3 months prior to seeking treatment at the wound center Timing: Pain in wound is Intermittent (comes and goes Context: The wound appeared gradually over time Modifying Factors: Other treatment(s) tried include:recently admitted to the hospital with cellulitis and sepsis Associated  Signs and Symptoms: Patient reports having increase discharge. HPI Description: 46 year old gentleman who was recently hospitalized for cellulitis of the right calf, lymphedema, morbid obesity and depression has been on clindamycin, and was referred to [place] for management of cellulitis and lymphedema. Past medical history significant for diabetes mellitus, TIA, edema, morbid obesity, sleep apnea, lymphedema and depression. He is not a smoker. he was recently admitted to the hospital between January 18 and 12/23/2016 for sepsis due to cellulitis of the right leg with hypertension, morbid obesity, lymphedema, acute kidney injury, hepatic cirrhosis and flulike symptoms. during his hospital course he was given vancomycin and Zosyn. he was discharged home on oral clindamycin. He is homebound at present and has not been working. 01/09/2017 -- the patient has not yet heard back from the lymphedema clinic and I have urged him to be in contact with them. The compression wraps which are going to be custom-made have been scheduled for this coming Tuesday. 01/16/2017 -- the patient has developed a lot of redness swelling and perfuse amount of discharge since yesterday and though he is not febrile he has had significant change in his physical findings. As noted he was admitted recently between January 18 and January 22 of 2018 at [facility] for a similar problem 01/23/2017 -- he was admitted to [facility] between February 15 and 01/19/2017 and treated for cellulitis of the right leg. he is also known to have chronic leukopenia and chronic thrombocytopenia possibly due to his liver cirrhosis. He was placed on IV vancomycin and Zosyn and discharged home on by mouth doxycycline and Augmentin for a 14 day course. 01/30/2017 -- he has not yet had his appointment with Dr. Ola Spurr which is scheduled for the middle of March. Completing his course of antibiotics this week 02/06/2017 -- his appointment with Dr.  Ola Spurr and with the lymphedema clinic as scheduled next week Electronic Signature(s) Signed: 02/06/2017 3:12:09 PM By: Christin Fudge MD, FACS Entered By: Christin Fudge on  02/06/2017 15:12:08 JOHNOTHAN, BASCOMB ([id number]) Collene Leyden, Abbott Pao ([id number]) -------------------------------------------------------------------------------- Physical Exam Details Patient Name: Ryan, Burgess. Date of Service: 02/06/2017 2:30 PM Medical Record Number: [id number] Patient Account Number: [id number] Date of Birth/Sex: 08-Jul-1971 (46 y.o. Male) Treating RN: Montey Hora Primary Care Provider: BESS, KATY Other Clinician: Referring Provider: BESS, KATY Treating Provider/Extender: Frann Rider in Treatment: 5 Constitutional . Pulse regular. Respirations normal and unlabored. Afebrile. . Eyes Nonicteric. Reactive to light. Ears, Nose, Mouth, and Throat Lips, teeth, and gums WNL.Marland Kitchen Moist mucosa without lesions. Neck supple and nontender. No palpable supraclavicular or cervical adenopathy. Normal sized without goiter. Respiratory WNL. No retractions.. Breath sounds WNL, No rubs, rales, rhonchi, or wheeze.. Cardiovascular Heart rhythm and rate regular, no murmur or gallop.. Pedal Pulses WNL. No clubbing, cyanosis or edema. Chest Breasts symmetical and no nipple discharge.. Breast tissue WNL, no masses, lumps, or tenderness.. Lymphatic No adneopathy. No adenopathy. No adenopathy. Musculoskeletal Adexa without tenderness or enlargement.. Digits and nails w/o clubbing, cyanosis, infection, petechiae, ischemia, or inflammatory conditions.. Integumentary (Hair, Skin) No suspicious lesions. No crepitus or fluctuance. No peri-wound warmth or erythema. No masses.Marland Kitchen Psychiatric Judgement and insight Intact.. No evidence of depression, anxiety, or agitation.. Notes the lymphedema as gone down significantly and there is no evidence of cellulitis. We will continue with application of a 4-layer  compression. Electronic Signature(s) Signed: 02/06/2017 3:12:37 PM By: Christin Fudge MD, FACS Entered By: Christin Fudge on 02/06/2017 15:12:36 Ryan Burgess ([id number]) -------------------------------------------------------------------------------- Physician Orders Details Patient Name: Ryan Burgess Date of Service: 02/06/2017 2:30 PM Medical Record Number: [id number] Patient Account Number: [id number] Date of Birth/Sex: [birth year] (46 y.o. Male) Treating RN: Montey Hora Primary Care Provider: BESS, KATY Other Clinician: Referring Provider: BESS, KATY Treating Provider/Extender: Frann Rider in Treatment: 5 Verbal / Phone Orders: No Diagnosis Coding Edema Control o 4 Layer Compression System - Bilateral [place] please discharge patient so he can be treated at the Lymphedema clinic Discharge From [facility] Services o Discharge from [place] - continue with compression daily from morning until bedtime Electronic Signature(s) Signed: 02/06/2017 4:59:03 PM By: Montey Hora Signed: 02/07/2017 3:56:49 PM By: Christin Fudge MD, FACS Previous Signature: 02/06/2017 4:19:30 PM Version By: Christin Fudge MD, FACS Entered By: Montey Hora on 02/06/2017 16:59:03 Ryan Burgess ([id number]) -------------------------------------------------------------------------------- Problem List Details Patient Name: [name] Date of Service: 02/06/2017 2:30 PM Medical Record Number: [id number] Patient Account Number: [id number] Date of Birth/Sex: [birth year] (46 y.o. Male) Treating RN: Montey Hora Primary Care Provider: BESS, KATY Other Clinician: Referring Provider: BESS, KATY Treating Provider/Extender: Frann Rider in Treatment: 5 Active Problems ICD-10 Encounter Code Description Active Date Diagnosis I89.0 Lymphedema, not elsewhere classified 01/02/2017 Yes L03.115 Cellulitis of right lower limb 01/02/2017  Yes E66.01 Morbid (severe) obesity due to excess calories 01/02/2017 Yes R73.03 Prediabetes 01/02/2017 Yes Inactive Problems Resolved Problems Electronic Signature(s) Signed: 02/06/2017 3:11:02 PM By: Christin Fudge MD, FACS Previous Signature: 02/06/2017 3:06:39 PM Version By: Christin Fudge MD, FACS Entered By: Christin Fudge on 02/06/2017 15:11:01 Ryan Burgess ([id number]) -------------------------------------------------------------------------------- Progress Note Details Patient Name: Ryan Burgess Date of Service: 02/06/2017 2:30 PM Medical Record Number: [id number] Patient Account Number: [id number] Date of Birth/Sex: 21-Apr-1971 (46 y.o. Male) Treating RN: Montey Hora Primary Care Provider: BESS, KATY Other Clinician: Referring Provider: Lillard Anes Treating Provider/Extender: Frann Rider in Treatment: 5 Subjective Chief Complaint Information obtained from Patient Patient presents to the wound care center for a consult  due non healing wound to the right lower extremity with extensive swelling over the last several years History of Present Illness (HPI) The following HPI elements were documented for the patient's wound: Location: bilateral lower extremity swelling with weeping and drainage from the right lower extremity Quality: Patient reports experiencing heaviness to affected area(s). Severity: Patient states wound are getting worse. Duration: Patient has had the wound for > 3 months prior to seeking treatment at the wound center Timing: Pain in wound is Intermittent (comes and goes Context: The wound appeared gradually over time Modifying Factors: Other treatment(s) tried include:recently admitted to the hospital with cellulitis and sepsis Associated Signs and Symptoms: Patient reports having increase discharge. 46 year old gentleman who was recently hospitalized for cellulitis of the right calf, lymphedema, morbid obesity and depression has been on clindamycin,  and was referred to [place] for management of cellulitis and lymphedema. Past medical history significant for diabetes mellitus, TIA, edema, morbid obesity, sleep apnea, lymphedema and depression. He is not a smoker. he was recently admitted to the hospital between January 18 and 12/23/2016 for sepsis due to cellulitis of the right leg with hypertension, morbid obesity, lymphedema, acute kidney injury, hepatic cirrhosis and flulike symptoms. during his hospital course he was given vancomycin and Zosyn. he was discharged home on oral clindamycin. He is homebound at present and has not been working. 01/09/2017 -- the patient has not yet heard back from the lymphedema clinic and I have urged him to be in contact with them. The compression wraps which are going to be custom-made have been scheduled for this coming Tuesday. 01/16/2017 -- the patient has developed a lot of redness swelling and perfuse amount of discharge since yesterday and though he is not febrile he has had significant change in his physical findings. As noted he was admitted recently between January 18 and January 22 of 2018 at [facility] for a similar problem 01/23/2017 -- he was admitted to [facility] between February 15 and 01/19/2017 and treated for cellulitis of the right leg. he is also known to have chronic leukopenia and chronic thrombocytopenia possibly due to his liver cirrhosis. He was placed on IV vancomycin and Zosyn and discharged home on by mouth doxycycline and Augmentin for a 14 day course. 01/30/2017 -- he has not yet had his appointment with Dr. Ola Spurr which is scheduled for the middle of AM[place], [place]ACKARD ([id number]) March. Completing his course of antibiotics this week 02/06/2017 -- his appointment with Dr. Ola Spurr and with the lymphedema clinic as scheduled next week Objective Constitutional Pulse regular. Respirations normal and unlabored. Afebrile. Vitals Time Taken: 2:40 PM, Height: 77 in, Weight:  495 lbs, BMI: 58.7, Temperature: 98.2 F, Pulse: 74 bpm, Respiratory Rate: 18 breaths/min, Blood Pressure: 160/65 mmHg. Eyes Nonicteric. Reactive to light. Ears, Nose, Mouth, and Throat Lips, teeth, and gums WNL.Marland Kitchen Moist mucosa without lesions. Neck supple and nontender. No palpable supraclavicular or cervical adenopathy. Normal sized without goiter. Respiratory WNL. No retractions.. Breath sounds WNL, No rubs, rales, rhonchi, or wheeze.. Cardiovascular Heart rhythm and rate regular, no murmur or gallop.. Pedal Pulses WNL. No clubbing, cyanosis or edema. Chest Breasts symmetical and no nipple discharge.. Breast tissue WNL, no masses, lumps, or tenderness.. Lymphatic No adneopathy. No adenopathy. No adenopathy. Musculoskeletal Adexa without tenderness or enlargement.. Digits and nails w/o clubbing, cyanosis, infection, petechiae, ischemia, or inflammatory conditions.Marland Kitchen Psychiatric Judgement and insight Intact.. No evidence of depression, anxiety, or agitation.. General Notes: the lymphedema as gone down significantly and there is no evidence  of cellulitis. We will continue with application of a 4-layer compression. Integumentary (Hair, Skin) Hashem, Wolf B. ([id number]) No suspicious lesions. No crepitus or fluctuance. No peri-wound warmth or erythema. No masses.. Wound #1 status is Open. Original cause of wound was Gradually Appeared. The wound is located on the Right,Circumferential Lower Leg. The wound measures 0cm length x 0cm width x 0cm depth; 0cm^2 area and 0cm^3 volume. Assessment Active Problems ICD-10 I89.0 - Lymphedema, not elsewhere classified L03.115 - Cellulitis of right lower limb E66.01 - Morbid (severe) obesity due to excess calories R73.03 - Prediabetes Plan Edema Control: 4 Layer Compression System - Bilateral Home Health: D/C [place] please discharge patient so he can be treated at the Lymphedema clinic Discharge From [facility]  Services: Discharge from [place] - continue with compression daily from morning until bedtime After review I have recommended: 1. 4-layer compression to the right lower extremity and appropriate padding and 4-layer compression to the left lower extremity. 2. he is ready to have his care transferred to the lymphedema clinic 3. we will discharge him from our services as his care has been taken over by the lymphedema clinic Electronic Signature(s) ZAILYN, ROWSER ([id number]) Signed: 02/07/2017 3:58:32 PM By: Christin Fudge MD, FACS Previous Signature: 02/06/2017 3:13:34 PM Version By: Christin Fudge MD, FACS Entered By: Christin Fudge on 02/07/2017 15:58:32 Ryan Burgess ([id number]) -------------------------------------------------------------------------------- Central Details Patient Name: Ryan Burgess Date of Service: 02/06/2017 Medical Record Number: [id number] Patient Account Number: [id number] Date of Birth/Sex: [birth year] (46 y.o. Male) Treating RN: Montey Hora Primary Care Provider: BESS, KATY Other Clinician: Referring Provider: BESS, KATY Treating Provider/Extender: Christin Fudge Service Line: Outpatient Weeks in Treatment: 5 Diagnosis Coding ICD-10 Codes Code Description I89.0 Lymphedema, not elsewhere classified L03.115 Cellulitis of right lower limb E66.01 Morbid (severe) obesity due to excess calories R73.03 Prediabetes Facility Procedures CPT4: Description Modifier Quantity Code [id number] BILATERAL: Application of multi-layer venous compression 1 system; leg (below knee), including ankle and foot. Physician Procedures CPT4 Code: [id number] Description: 43568 - WC PHYS LEVEL 3 - EST PT ICD-10 Description Diagnosis L03.115 Cellulitis of right lower limb I89.0 Lymphedema, not elsewhere classified E66.01 Morbid (severe) obesity due to excess calori R73.03 Prediabetes Modifier: es Quantity: 1 Electronic Signature(s) Signed: 02/06/2017 4:58:21 PM By:  Montey Hora Signed: 02/07/2017 3:56:49 PM By: Christin Fudge MD, FACS Previous Signature: 02/06/2017 3:13:52 PM Version By: Christin Fudge MD, FACS Entered By: Montey Hora on 02/06/2017 16:58:21

## 2017-02-13 ENCOUNTER — Ambulatory Visit: Payer: BLUE CROSS/BLUE SHIELD | Attending: Surgery | Admitting: Occupational Therapy

## 2017-02-13 ENCOUNTER — Encounter: Payer: Self-pay | Admitting: Occupational Therapy

## 2017-02-13 DIAGNOSIS — I890 Lymphedema, not elsewhere classified: Secondary | ICD-10-CM | POA: Insufficient documentation

## 2017-02-13 NOTE — Patient Instructions (Signed)

## 2017-02-20 ENCOUNTER — Ambulatory Visit: Payer: BLUE CROSS/BLUE SHIELD | Admitting: Occupational Therapy

## 2017-02-20 DIAGNOSIS — I890 Lymphedema, not elsewhere classified: Secondary | ICD-10-CM | POA: Diagnosis not present

## 2017-02-20 NOTE — Therapy (Signed)
[place] MAIN [facility] SERVICES [address] [place], [place], [address] Phone: [phone]   Fax:  [phone]  Occupational Therapy Treatment  Patient Details  Name: Ryan Burgess MRN: [id number] Date of Birth: [birth year] Referring Provider: Christin Fudge, MD  Encounter Date: 02/20/2017      OT End of Session - 02/20/17 1238    Visit Number 2   Number of Visits 36   Date for OT Re-Evaluation 05/14/17   OT Start Time 0805   OT Stop Time 0910   OT Time Calculation (min) 65 min   Activity Tolerance Patient tolerated treatment well;No increased pain;Other (comment)  limited by body habitus   Behavior During Therapy [facility] for tasks assessed/performed      Past Medical History:  Diagnosis Date  . Cellulitis   . Cirrhosis, nonalcoholic ([place])   . GERD (gastroesophageal reflux disease)   . Headache    "maybe monthly" (12/19/2016)  . Hepatic encephalopathy ([place]) ~ 2016 X 2   "first time they thought it was a TIA; dx'd hepatic encephalopathy the 2nd time it happened"  . High cholesterol   . Hypertension   . Kidney stones 01/2015   UTI  . Migraine    "none in years; used to have them frequently" (12/19/2016)  . Obesity   . OSA on CPAP   . Pre-diabetes   . Snoring   . Venous stasis     Past Surgical History:  Procedure Laterality Date  . ESOPHAGOGASTRODUODENOSCOPY (EGD) WITH PROPOFOL N/A 07/26/2015   Procedure: ESOPHAGOGASTRODUODENOSCOPY (EGD) WITH PROPOFOL;  Surgeon: Arta Silence, MD;  Location[facility];  Service: Endoscopy;  Laterality: N/A;  . TEE WITHOUT CARDIOVERSION N/A 04/03/2015   Procedure: TRANSESOPHAGEAL ECHOCARDIOGRAM (TEE);  Surgeon: Adrian Prows, MD;  Location: [facility];  Service: Cardiovascular;  Laterality: N/A;  . WISDOM TOOTH EXTRACTION  1990    There were no vitals filed for this visit.      Subjective Assessment - 02/20/17 1014    Subjective  Pt returns for OT visit 2 / 36 to commence CDT to BLE, starting  w/ LLE today. Pt has no new complaints. Pt arrives in trANSPORT W/C W/ CUSTOM COMPRESSION KNEE HIGHS IN PLACE.   Pertinent History Recent episode of LE cellulitis and sepsis w/ hospitalization; hx TIA, DM, morbid obesity, OSA, depression; non-smoker   Limitations Difficulty walking, difficulty w/ functional mobility and transfers, decreased ability to perform basic and instrumental ADLs (LB dressing and bathing, fitting and donning shoes and socks, difficulty performing shoping and  all home management tasks requiring standing and walking) Difficulty performing productive and work activities, restricted social participation in the community   Repetition Increases Symptoms   Patient Stated Goals decrease leg swelling and keep it from getting worse so I can do more   Currently in Pain? Yes  unchanged LLE pain/ discomfort since eval   Pain Location Leg   Pain Descriptors / Indicators Tightness;Heaviness  tightness   Pain Type Chronic pain   Pain Onset Other (comment)  chronic            [facility] OT Assessment - 02/20/17 0001      Assessment   Diagnosis sever, stage 2, BLE LE, L>R   Referring Provider Christin Fudge, MD   Assessment onset > 10 yrs ago- gradual onset. + Family hx for lymphedema w/ paternal grandmother   Prior Therapy has ccl 1, custom Elvarex knee length compression garments- correct fabric, but  inadequate compression in this case  Precautions   Precautions Other (comment)  DM skin precautions, LE precautions     Restrictions   Weight Bearing Restrictions No     Home  Environment   Family/patient expects to be discharged to: Private residence   Living Arrangements Spouse/significant other   Available Help at Discharge Family   Type of [place] with household mobility with device;Requires assistive device for independence;Needs assistance with ADLs;Needs assistance with homemaking;Needs assistance with  gait;Needs assistance with transfers   Vocation Unemployed   Vocation Requirements sitting and working at computer, walking, standing     Mobility   Mobility Status Comments uses cane in the home, drives self     Written Expression   Dominant Hand Right     Activity Tolerance   Activity Tolerance Comments pt reports decreased endurance for walking     Cognition   Overall Cognitive Status Within Functional Limits for tasks assessed     Observation/Other Assessments   Observations LLE appears to be ~ 25% greater in overall volume than RLE.   Skin Integrity Skin severly dry and flaking, reddened, and indurated with dense fibrosis and peau de orange at medial and posterior thigh lobules.  Nail fungis and very deep creases at base of toes, L>R, indicatiive of years of progression. Skin is reddenend and cool to the touch. Pt has hx of lymphorrhea and non-healing leg ulcer, bot no weeping or open wounds observed today.     Coordination   Gross Motor Movements are Fluid and Coordinated Yes   Fine Motor Movements are Fluid and Coordinated Yes     ROM / Strength   AROM / PROM / Strength AROM;PROM  limited by girth at all BLE joints / body habitus         LYMPHEDEMA/ONCOLOGY QUESTIONNAIRE - 02/20/17 1228      Right Lower Extremity Lymphedema   Other RLE A-D= 9497.36 ml; RLE E-G= 12091.71 ml; RLE A-G= 21589.07 ml     Left Lower Extremity Lymphedema   Other LLE A-D= 27035.59 ml; RLE E-G= 13452.68 ml; RLE A-G= 24,835.28 ml   Other Limb volume differentials (LVD): A-D: R>L, 16.56%; E-G: L>R, 10.12%; A-G: 13.07%, L>R                 OT Treatments/Exercises (OP) - 02/20/17 1227      Manual Therapy   Manual Therapy Edema management;Compression Bandaging   Manual therapy comments BLE comparative limb volumetrics completed today   Compression Bandaging LLE: Custom toe cap fabricated with CoWrap. 4" stockinet from foot to below knee- edges folded over Rosidal foam layered ~ 50% on  top of stockinett to below knee. 10  cm x 5 m x 1 short stretch wrap to foot and ankle w/ only single foot layer- using gradient pattern. 12 cm x 5 m x 1 anchoring at distal ankle and then at achilles origin extending proximally. 12 cm x5 cm x 3`, applied circumferentially in custommary layered gradient configuration.  Lastly, Fitted w/  Stretch net over leg and non-slip slipper sock to limit falls risk.                OT Education - 02/20/17 1237    Education provided Yes   Education Details Continued skilled Pt/caregiver education  And LE ADL training throughout visit for lymphedema self care/ home program, including compression wrapping, compression garment and device wear/care, lymphatic pumping ther ex, simple self-MLD, and skin care.  Discussed progress towards goals.   Person(s) Educated Patient   Methods Explanation;Demonstration;Handout   Comprehension Verbalized understanding;Need further instruction             OT Long Term Goals - 02/20/17 1007      OT LONG TERM GOAL #1   Title Pt independent w/ lymphedema precautions and prevention principals and strategies to limit LE progression and infection risk.   Baseline dependent   Time 1   Period Weeks   Status New     OT LONG TERM GOAL #2   Title Lymphedema (LE) management/ self-care: Pt able to apply multi layered, gradient compression wraps w Max caregiver assistance using proper techniques within 2 weeks to achieve optimal limb volume reductions bilaterally.   Baseline dependent   Time 2   Period Weeks   Status New     OT LONG TERM GOAL #3   Title Lymphedema (LE) management/ self-care:  Pt to achieve at least 10% limb volume reductions bilaterally during Intensive CDT to limit LE progression, to reduce pain, and to improve safe ambulation and functional mobility.   Baseline dependent   Time 12   Period Weeks   Status New     OT LONG TERM GOAL #4   Title Lymphedema (LE) management/ self-care:  Pt to tolerate  daily compression wraps, garments and devices in keeping w/ prescribed wear regime within 1 week of issue date to progress and retain clinical and functional gains and to limit LE progression.   Baseline dependent   Time 12   Period Weeks   Status New     OT LONG TERM GOAL #5   Title Lymphedema (LE) management/ self-care:  During Management Phase CDT Pt to sustain current limb volumes within 5%, and all other clinical gains achieved during OT treatment with needed level of caregiver assistance to limit LE progression, infection risk and further functional decline.   Baseline dependent   Time 6   Period Months   Status New               Plan - 02/20/17 1310    Clinical Impression Statement BLE comparative limb volumetrics reveal LLE is larger volumetrically than RLE at all 3 landmarks, A ankle to knee, knee to groin, and ankle to groin. See TREATMENT section for specific limb volume differentials. Pt tolerated compression application to LLE below the knee today. We discussed extending wraps to groin, but stockinett large enough to fit thigh was not available in clinic.Marland Kitchen Cont as per POC.   Rehab Potential Good   OT Frequency 3x / week   OT Duration 12 weeks   OT Treatment/Interventions Self-care/ADL training;DME and/or AE instruction;Manual lymph drainage;Patient/family education;Compression bandaging;Therapeutic exercises;Therapeutic activities;Therapeutic exercise;Energy conservation;Manual Therapy;Other (comment)  skin care   Consulted and Agree with Plan of Care Patient      Patient will benefit from skilled therapeutic intervention in order to improve the following deficits and impairments:  Abnormal gait, Decreased knowledge of use of DME, Decreased skin integrity, Increased edema, Impaired flexibility, Pain, Decreased mobility, Decreased scar mobility, Decreased endurance, Decreased range of motion, Decreased strength, Decreased balance, Decreased knowledge of precautions,  Difficulty walking, Obesity  Visit Diagnosis: Lymphedema, not elsewhere classified    Problem List Patient Active Problem List   Diagnosis Date Noted  . Cellulitis of leg, left 01/16/2017  . Cellulitis of leg, right 12/23/2016  . AKI (acute kidney injury) ([place]) 12/19/2016  . Hyponatremia 12/19/2016  . Pressure injury of skin 12/19/2016  . Encephalopathy,  hepatic ([place]) 11/20/2015  . Hepatic cirrhosis ([place]) 11/20/2015  . Thrombocytopenia ([place])   . Disorientation 11/19/2015  . Stroke or transient ischemic attack (TIA) diagnosed during current admission 11/19/2015  . OSA on CPAP 05/02/2015  . Hemorrhagic cystitis 01/29/2015  . Sepsis ([place]) 01/29/2015  . Flank pain 01/29/2015  . Sinus tachycardia 01/29/2015  . Lymphedema 01/29/2015  . Morbid obesity with body mass index of 50.0-59.9 in adult [facility]) 01/25/2015  . Obesity hypoventilation syndrome ([place]) 01/25/2015  . Snoring   . High cholesterol   . TIA (transient ischemic attack) 01/10/2015  . Morbid obesity ([place]) 01/10/2015  . Encephalopathy acute 01/09/2015  . HTN (hypertension) 01/09/2015  . Borderline diabetic 01/09/2015  . GERD (gastroesophageal reflux disease) 01/09/2015  . Acute encephalopathy     Andrey Spearman, MS, OTR/L, [facility] 02/20/17 1:14 PM  [place] MAIN [facility] SERVICES [address] [place], [place], [address] Phone: [phone]   Fax:  [phone]  Name: Ryan Burgess MRN: [id number] Date of Birth: 12/29/1970

## 2017-02-20 NOTE — Therapy (Signed)
[place] MAIN [facility] SERVICES [address] [place], [place], [address] Phone: [phone]   Fax:  [phone]  Occupational Therapy Evaluation  Patient Details  Name: Ryan Burgess MRN: [id number] Date of Birth: 05/22/1971 Referring Provider: Christin Fudge, MD  Encounter Date: 02/13/2017    Past Medical History:  Diagnosis Date  . Cellulitis   . Cirrhosis, nonalcoholic ([place])   . GERD (gastroesophageal reflux disease)   . Headache    "maybe monthly" (12/19/2016)  . Hepatic encephalopathy ([place]) ~ 2016 X 2   "first time they thought it was a TIA; dx'd hepatic encephalopathy the 2nd time it happened"  . High cholesterol   . Hypertension   . Kidney stones 01/2015   UTI  . Migraine    "none in years; used to have them frequently" (12/19/2016)  . Obesity   . OSA on CPAP   . Pre-diabetes   . Snoring   . Venous stasis     Past Surgical History:  Procedure Laterality Date  . ESOPHAGOGASTRODUODENOSCOPY (EGD) WITH PROPOFOL N/A 07/26/2015   Procedure: ESOPHAGOGASTRODUODENOSCOPY (EGD) WITH PROPOFOL;  Surgeon: Arta Silence, MD;  [facility];  Service: Endoscopy;  Laterality: N/A;  . TEE WITHOUT CARDIOVERSION N/A 04/03/2015   Procedure: TRANSESOPHAGEAL ECHOCARDIOGRAM (TEE);  Surgeon: Adrian Prows, MD;  Location: [facility];  Service: Cardiovascular;  Laterality: N/A;  . WISDOM TOOTH EXTRACTION  1990    There were no vitals filed for this visit.      Subjective Assessment - 02/20/17 0927    Subjective  Pt is referred to Occupational Therapy to evaluate and treat BLE lymphedema by Christin Fudge , MD.    Pertinent History Recent episode of LE cellulitis and sepsis w/ hospitalization; hx TIA, DM, morbid obesity, OSA, depression; non-smoker   Limitations Difficulty walking, difficulty w/ functional mobility and transfers, decreased ability to perform basic and instrumental ADLs (LB dressing and bathing, fitting and donning shoes and  socks, difficulty performing shoping and  all home management tasks requiring standing and walking) Difficulty performing productive and work activities, restricted social participation in the community   Repetition Increases Symptoms   Patient Stated Goals decrease leg swelling and keep it from getting worse so I can do more   Currently in Pain? Yes   Pain Location Leg   Pain Orientation Right;Left   Pain Descriptors / Indicators Heaviness;Tightness;Other (Comment)  fullness   Pain Type Chronic pain   Pain Radiating Towards chronic back pain- not numerically rated   Pain Onset Other (comment)  chronic   Pain Frequency Intermittent   Aggravating Factors  standing, walking, extended sitting   Pain Relieving Factors elevation, compression   Effect of Pain on Daily Activities Limits basic and instrumental ADLs,  productive activities and leisure pursuits, restricts social participation and role performance           [facility] OT Assessment - 02/20/17 0001      Assessment   Diagnosis sever, stage 2, BLE LE, L>R   Referring Provider Christin Fudge, MD   Assessment onset > 10 yrs ago- gradual onset. + Family hx for lymphedema w/ paternal grandmother   Prior Therapy has ccl 1, custom Elvarex knee length compression garments- correct fabric, but  inadequate compression in this case     Precautions   Precautions Other (comment)  DM skin precautions, LE precautions     Restrictions   Weight Bearing Restrictions No     Home  Environment   Family/patient expects to  be discharged to: Private residence   Living Arrangements Spouse/significant other   Available Help at Discharge Family   Type of [place]     Prior Function   Level of Independence Independent with household mobility with device;Requires assistive device for independence;Needs assistance with ADLs;Needs assistance with homemaking;Needs assistance with gait;Needs assistance with transfers   Vocation Unemployed   Vocation  Requirements sitting and working at computer, walking, standing     Mobility   Mobility Status Comments uses cane in the home, drives self     Written Expression   Dominant Hand Right     Activity Tolerance   Activity Tolerance Comments pt reports decreased endurance for walking     Cognition   Overall Cognitive Status Within Functional Limits for tasks assessed     Observation/Other Assessments   Observations LLE appears to be ~ 25% greater in overall volume than RLE.   Skin Integrity Skin severly dry and flaking, reddened, and indurated with dense fibrosis and peau de orange at medial and posterior thigh lobules.  Nail fungis and very deep creases at base of toes, L>R, indicatiive of years of progression. Skin is reddenend and cool to the touch. Pt has hx of lymphorrhea and non-healing leg ulcer, bot no weeping or open wounds observed today.     Coordination   Gross Motor Movements are Fluid and Coordinated Yes   Fine Motor Movements are Fluid and Coordinated Yes     ROM / Strength   AROM / PROM / Strength AROM;PROM  limited by girth at all BLE joints / body habitus          LYMPHEDEMA/ONCOLOGY QUESTIONNAIRE - 02/20/17 0951      Lymphedema Assessments   Lymphedema Assessments Lower extremities     Right Lower Extremity Lymphedema   Other TBA Rx visit 1                OT Treatments/Exercises (OP) - 02/20/17 0001      ADLs   ADL Education Given Yes     Manual Therapy   Manual Therapy Edema management                    OT Long Term Goals - 02/20/17 1007      OT LONG TERM GOAL #1   Title Pt independent w/ lymphedema precautions and prevention principals and strategies to limit LE progression and infection risk.   Baseline dependent   Time 1   Period Weeks   Status New     OT LONG TERM GOAL #2   Title Lymphedema (LE) management/ self-care: Pt able to apply multi layered, gradient compression wraps w Max caregiver assistance using proper  techniques within 2 weeks to achieve optimal limb volume reductions bilaterally.   Baseline dependent   Time 2   Period Weeks   Status New     OT LONG TERM GOAL #3   Title Lymphedema (LE) management/ self-care:  Pt to achieve at least 10% limb volume reductions bilaterally during Intensive CDT to limit LE progression, to reduce pain, and to improve safe ambulation and functional mobility.   Baseline dependent   Time 12   Period Weeks   Status New     OT LONG TERM GOAL #4   Title Lymphedema (LE) management/ self-care:  Pt to tolerate daily compression wraps, garments and devices in keeping w/ prescribed wear regime within 1 week of issue date to progress and retain clinical and functional gains and to  limit LE progression.   Baseline dependent   Time 12   Period Weeks   Status New     OT LONG TERM GOAL #5   Title Lymphedema (LE) management/ self-care:  During Management Phase CDT Pt to sustain current limb volumes within 5%, and all other clinical gains achieved during OT treatment with needed level of caregiver assistance to limit LE progression, infection risk and further functional decline.   Baseline dependent   Time 6   Period Months   Status New             Patient will benefit from skilled therapeutic intervention in order to improve the following deficits and impairments:  Abnormal gait, Decreased knowledge of use of DME, Decreased skin integrity, Increased edema, Impaired flexibility, Pain, Decreased mobility, Decreased scar mobility, Decreased endurance, Decreased range of motion, Decreased strength, Decreased balance, Decreased knowledge of precautions, Difficulty walking, Obesity  Visit Diagnosis: Lymphedema, not elsewhere classified - Plan: Ot plan of care cert/re-cert    Problem List Patient Active Problem List   Diagnosis Date Noted  . Cellulitis of leg, left 01/16/2017  . Cellulitis of leg, right 12/23/2016  . AKI (acute kidney injury) ([place]) 12/19/2016   . Hyponatremia 12/19/2016  . Pressure injury of skin 12/19/2016  . Encephalopathy, hepatic ([place]) 11/20/2015  . Hepatic cirrhosis ([place]) 11/20/2015  . Thrombocytopenia ([place])   . Disorientation 11/19/2015  . Stroke or transient ischemic attack (TIA) diagnosed during current admission 11/19/2015  . OSA on CPAP 05/02/2015  . Hemorrhagic cystitis 01/29/2015  . Sepsis ([place]) 01/29/2015  . Flank pain 01/29/2015  . Sinus tachycardia 01/29/2015  . Lymphedema 01/29/2015  . Morbid obesity with body mass index of 50.0-59.9 in adult [facility]) 01/25/2015  . Obesity hypoventilation syndrome ([place]) 01/25/2015  . Snoring   . High cholesterol   . TIA (transient ischemic attack) 01/10/2015  . Morbid obesity ([place]) 01/10/2015  . Encephalopathy acute 01/09/2015  . HTN (hypertension) 01/09/2015  . Borderline diabetic 01/09/2015  . GERD (gastroesophageal reflux disease) 01/09/2015  . Acute encephalopathy     Andrey Spearman, MS, OTR/L, [facility] 02/20/17 10:13 AM  [place] MAIN [facility] SERVICES [address] [place], [place], [address] Phone: [phone]   Fax:  [phone]  Name: BEVAN DISNEY MRN: [id number] Date of Birth: 11-Jul-1971

## 2017-02-21 ENCOUNTER — Ambulatory Visit: Payer: BLUE CROSS/BLUE SHIELD | Admitting: Occupational Therapy

## 2017-02-21 DIAGNOSIS — I890 Lymphedema, not elsewhere classified: Secondary | ICD-10-CM

## 2017-02-21 NOTE — Therapy (Signed)
[place] MAIN [facility] SERVICES [address] [place], [place], [address] Phone: [phone]   Fax:  [phone]  Occupational Therapy Treatment  Patient Details  Name: KENTRAVIOUS LIPFORD MRN: [id number] Date of Birth: 1971/08/06 Referring Provider: Christin Fudge, MD  Encounter Date: 02/21/2017      OT End of Session - 02/21/17 0912    Visit Number 3   Number of Visits 36   Date for OT Re-Evaluation 05/14/17   OT Start Time 0805   OT Stop Time 0903   OT Time Calculation (min) 58 min   Activity Tolerance Patient tolerated treatment well;No increased pain;Other (comment)  limited by body habitus   Behavior During Therapy [facility] for tasks assessed/performed      Past Medical History:  Diagnosis Date  . Cellulitis   . Cirrhosis, nonalcoholic ([place])   . GERD (gastroesophageal reflux disease)   . Headache    "maybe monthly" (12/19/2016)  . Hepatic encephalopathy ([place]) ~ 2016 X 2   "first time they thought it was a TIA; dx'd hepatic encephalopathy the 2nd time it happened"  . High cholesterol   . Hypertension   . Kidney stones 01/2015   UTI  . Migraine    "none in years; used to have them frequently" (12/19/2016)  . Obesity   . OSA on CPAP   . Pre-diabetes   . Snoring   . Venous stasis     Past Surgical History:  Procedure Laterality Date  . ESOPHAGOGASTRODUODENOSCOPY (EGD) WITH PROPOFOL N/A 07/26/2015   Procedure: ESOPHAGOGASTRODUODENOSCOPY (EGD) WITH PROPOFOL;  Surgeon: Arta Silence, MD;  Location: [facility];  Service: Endoscopy;  Laterality: N/A;  . TEE WITHOUT CARDIOVERSION N/A 04/03/2015   Procedure: TRANSESOPHAGEAL ECHOCARDIOGRAM (TEE);  Surgeon: Adrian Prows, MD;  Location: [place];  Service: Cardiovascular;  Laterality: N/A;  . WISDOM TOOTH EXTRACTION  1990    There were no vitals filed for this visit.      Subjective Assessment - 02/21/17 0907    Subjective  Pt returns for OT visit 3/ 36 for CDT to BLE w/ emphasis on LLE   initially, then RLE. Pt has no new complaints. Pt arrives w/ LLE  knee length compression in place. Pt reports he had no difficulty tolerating gradient compression wrap over last 24 hrs.   Pertinent History Recent episode of LE cellulitis and sepsis w/ hospitalization; hx TIA, DM, morbid obesity, OSA, depression; non-smoker   Limitations Difficulty walking, difficulty w/ functional mobility and transfers, decreased ability to perform basic and instrumental ADLs (LB dressing and bathing, fitting and donning shoes and socks, difficulty performing shoping and  all home management tasks requiring standing and walking) Difficulty performing productive and work activities, restricted social participation in the community   Repetition Increases Symptoms   Patient Stated Goals decrease leg swelling and keep it from getting worse so I can do more   Currently in Pain? No/denies   Pain Onset Other (comment)  chronic             LYMPHEDEMA/ONCOLOGY QUESTIONNAIRE - 02/20/17 1228      Right Lower Extremity Lymphedema   Other RLE A-D= 9497.36 ml; RLE E-G= 12091.71 ml; RLE A-G= 21589.07 ml     Left Lower Extremity Lymphedema   Other LLE A-D= 96283.59 ml; RLE E-G= 13452.68 ml; RLE A-G= 24,835.28 ml   Other Limb volume differentials (LVD): A-D: R>L, 16.56%; E-G: L>R, 10.12%; A-G: 13.07%, L>R  OT Treatments/Exercises (OP) - 02/21/17 0001      ADLs   ADL Education Given Yes     Manual Therapy   Manual Therapy Edema management;Compression Bandaging   Edema Management LLE skin care w/ low pH Eucerin lotion   Compression Bandaging LLE: toe bandage replaced CoWrap today. 4" stockinet from foot to below knee- edges folded over Rosidal foam layered ~ 50% on top of stockinett to below knee. 10  cm x 5 m x 1 short stretch wrap to foot and ankle w/ only single foot layer- using gradient pattern. 12 cm x 5 m x 1 anchoring at distal ankle and then at achilles origin extending proximally.  12 cm x5 cm x 3`, applied circumferentially in custommary layered gradient configuration.  Lastly, Fitted w/  Stretch net over leg and non-slip slipper sock to limit falls risk.                OT Education - 02/21/17 0910    Education provided Yes   Education Details Emphasis of LE self-care training today on proper application techniques for gradient compression wrapping. Also edu for sock donner to increase independence w/ LB dressing   Person(s) Educated Patient   Methods Explanation;Demonstration;Other (comment)   Comprehension Verbalized understanding             OT Long Term Goals - 02/20/17 1007      OT LONG TERM GOAL #1   Title Pt independent w/ lymphedema precautions and prevention principals and strategies to limit LE progression and infection risk.   Baseline dependent   Time 1   Period Weeks   Status New     OT LONG TERM GOAL #2   Title Lymphedema (LE) management/ self-care: Pt able to apply multi layered, gradient compression wraps w Max caregiver assistance using proper techniques within 2 weeks to achieve optimal limb volume reductions bilaterally.   Baseline dependent   Time 2   Period Weeks   Status New     OT LONG TERM GOAL #3   Title Lymphedema (LE) management/ self-care:  Pt to achieve at least 10% limb volume reductions bilaterally during Intensive CDT to limit LE progression, to reduce pain, and to improve safe ambulation and functional mobility.   Baseline dependent   Time 12   Period Weeks   Status New     OT LONG TERM GOAL #4   Title Lymphedema (LE) management/ self-care:  Pt to tolerate daily compression wraps, garments and devices in keeping w/ prescribed wear regime within 1 week of issue date to progress and retain clinical and functional gains and to limit LE progression.   Baseline dependent   Time 12   Period Weeks   Status New     OT LONG TERM GOAL #5   Title Lymphedema (LE) management/ self-care:  During Management Phase CDT  Pt to sustain current limb volumes within 5%, and all other clinical gains achieved during OT treatment with needed level of caregiver assistance to limit LE progression, infection risk and further functional decline.   Baseline dependent   Time 6   Period Months   Status New               Plan - 02/21/17 1058    Clinical Impression Statement Notable decrease observed below knee on L for limb volume reduction after compression wraps applied yesterday for first time. Skin condition is also much improved with increased skin care at home.  After skilled traing for LE  self care, Pt able to verbalize understanding of gradient wrapping technique. He is unable to reach his distal legs and feet to apply wraps , so will require max assist from his spouse . He agreed to ask her to come to visit in the near future. After skilled training Pt able to don stockinett for wrapping using sock donner with min A. Cont as per POC.   Rehab Potential Good   OT Frequency 3x / week   OT Duration 12 weeks   OT Treatment/Interventions Self-care/ADL training;DME and/or AE instruction;Manual lymph drainage;Patient/family education;Compression bandaging;Therapeutic exercises;Therapeutic activities;Therapeutic exercise;Energy conservation;Manual Therapy;Other (comment)  skin care   Consulted and Agree with Plan of Care Patient      Patient will benefit from skilled therapeutic intervention in order to improve the following deficits and impairments:  Abnormal gait, Decreased knowledge of use of DME, Decreased skin integrity, Increased edema, Impaired flexibility, Pain, Decreased mobility, Decreased scar mobility, Decreased endurance, Decreased range of motion, Decreased strength, Decreased balance, Decreased knowledge of precautions, Difficulty walking, Obesity  Visit Diagnosis: Lymphedema    Problem List Patient Active Problem List   Diagnosis Date Noted  . Cellulitis of leg, left 01/16/2017  . Cellulitis of  leg, right 12/23/2016  . AKI (acute kidney injury) ([place]) 12/19/2016  . Hyponatremia 12/19/2016  . Pressure injury of skin 12/19/2016  . Encephalopathy, hepatic ([place]) 11/20/2015  . Hepatic cirrhosis ([place]) 11/20/2015  . Thrombocytopenia ([place])   . Disorientation 11/19/2015  . Stroke or transient ischemic attack (TIA) diagnosed during current admission 11/19/2015  . OSA on CPAP 05/02/2015  . Hemorrhagic cystitis 01/29/2015  . Sepsis ([place]) 01/29/2015  . Flank pain 01/29/2015  . Sinus tachycardia 01/29/2015  . Lymphedema 01/29/2015  . Morbid obesity with body mass index of 50.0-59.9 in adult [facility]) 01/25/2015  . Obesity hypoventilation syndrome ([place]) 01/25/2015  . Snoring   . High cholesterol   . TIA (transient ischemic attack) 01/10/2015  . Morbid obesity ([place]) 01/10/2015  . Encephalopathy acute 01/09/2015  . HTN (hypertension) 01/09/2015  . Borderline diabetic 01/09/2015  . GERD (gastroesophageal reflux disease) 01/09/2015  . Acute encephalopathy     Andrey Spearman, MS, OTR/L, [facility] 02/21/17 11:03 AM  [place] MAIN [facility] SERVICES [address] [place], [place], [address] Phone: [phone]   Fax:  [phone]  Name: JEFFERIE HOLSTON MRN: [id number] Date of Birth: February 16, 1971

## 2017-02-24 ENCOUNTER — Ambulatory Visit: Payer: BLUE CROSS/BLUE SHIELD | Admitting: Occupational Therapy

## 2017-02-24 ENCOUNTER — Other Ambulatory Visit: Payer: Self-pay

## 2017-02-24 DIAGNOSIS — I890 Lymphedema, not elsewhere classified: Secondary | ICD-10-CM | POA: Diagnosis not present

## 2017-02-24 MED ORDER — FUROSEMIDE 40 MG PO TABS: 40.0000 mg | ORAL_TABLET | Freq: Every day | ORAL | 0 refills | Status: DC

## 2017-02-24 NOTE — Telephone Encounter (Signed)
We have not prescribed this medication for the patient previously.  Fax from [facility] indicated that pt states he is taking 3 tablets daily.  Please review and approve if appropriate.  Charyl Bigger, CMA

## 2017-02-24 NOTE — Telephone Encounter (Signed)
Last OV reflected Furosemide dose of 40mg .  Reviewing other speciality notes Furosemide dose documented anywhere from 20-60mg  daily dose. Will refill Furosemide 40mg  once daily.  Please make appointment to discuss dosage if needed.

## 2017-02-24 NOTE — Therapy (Signed)
[place] MAIN [facility] SERVICES [address] [place], [place], [address] Phone: [phone]   Fax:  [phone]  Occupational Therapy Treatment  Patient Details  Name: Ryan Burgess MRN: [id number] Date of Birth: Jul 17, 1971 Referring Provider: Christin Fudge, MD  Encounter Date: 02/24/2017      OT End of Session - 02/24/17 1611    Visit Number 4   Number of Visits 36   Date for OT Re-Evaluation 05/14/17   OT Start Time 1110   OT Stop Time 1155   OT Time Calculation (min) 45 min   Activity Tolerance Patient tolerated treatment well;No increased pain;Other (comment)  limited by body habitus   Behavior During Therapy [facility] for tasks assessed/performed      Past Medical History:  Diagnosis Date  . Cellulitis   . Cirrhosis, nonalcoholic ([place])   . GERD (gastroesophageal reflux disease)   . Headache    "maybe monthly" (12/19/2016)  . Hepatic encephalopathy ([place]) ~ 2016 X 2   "first time they thought it was a TIA; dx'd hepatic encephalopathy the 2nd time it happened"  . High cholesterol   . Hypertension   . Kidney stones 01/2015   UTI  . Migraine    "none in years; used to have them frequently" (12/19/2016)  . Obesity   . OSA on CPAP   . Pre-diabetes   . Snoring   . Venous stasis     Past Surgical History:  Procedure Laterality Date  . ESOPHAGOGASTRODUODENOSCOPY (EGD) WITH PROPOFOL N/A 07/26/2015   Procedure: ESOPHAGOGASTRODUODENOSCOPY (EGD) WITH PROPOFOL;  Surgeon: Arta Silence, MD;  [facility];  Service: Endoscopy;  Laterality: N/A;  . TEE WITHOUT CARDIOVERSION N/A 04/03/2015   Procedure: TRANSESOPHAGEAL ECHOCARDIOGRAM (TEE);  Surgeon: Adrian Prows, MD;  Location: [place];  Service: Cardiovascular;  Laterality: N/A;  . WISDOM TOOTH EXTRACTION  1990    There were no vitals filed for this visit.      Subjective Assessment - 02/24/17 1607    Subjective  Pt returns for OT visit 4/ 36 for CDT to BLE w/ emphasis on LLE   initially, then RLE. Pt has no new complaints. Pt arrives w/ LLE  knee length compression in place, reporting his wife reapplied 2 x over the weekend with him coaching her.   Pertinent History Recent episode of LE cellulitis and sepsis w/ hospitalization; hx TIA, DM, morbid obesity, OSA, depression; non-smoker   Limitations Difficulty walking, difficulty w/ functional mobility and transfers, decreased ability to perform basic and instrumental ADLs (LB dressing and bathing, fitting and donning shoes and socks, difficulty performing shoping and  all home management tasks requiring standing and walking) Difficulty performing productive and work activities, restricted social participation in the community   Repetition Increases Symptoms   Patient Stated Goals decrease leg swelling and keep it from getting worse so I can do more   Currently in Pain? Yes  decreased tightness, heaviness, fullness below knee and less so iin thigh since commencing CDT. Not numerically rated.   Pain Onset Other (comment)  chronic                              OT Education - 02/24/17 1610    Education provided Yes   Education Details Continued skilled Pt/caregiver education  And LE ADL training throughout visit for lymphedema self care/ home program, including compression wrapping, compression garment and device wear/care, lymphatic pumping ther ex, simple self-MLD,  and skin care. Discussed progress towards goals.   Person(s) Educated Patient   Methods Explanation;Demonstration   Comprehension Verbalized understanding;Returned demonstration             OT Long Term Goals - 02/20/17 1007      OT LONG TERM GOAL #1   Title Pt independent w/ lymphedema precautions and prevention principals and strategies to limit LE progression and infection risk.   Baseline dependent   Time 1   Period Weeks   Status New     OT LONG TERM GOAL #2   Title Lymphedema (LE) management/ self-care: Pt able to  apply multi layered, gradient compression wraps w Max caregiver assistance using proper techniques within 2 weeks to achieve optimal limb volume reductions bilaterally.   Baseline dependent   Time 2   Period Weeks   Status New     OT LONG TERM GOAL #3   Title Lymphedema (LE) management/ self-care:  Pt to achieve at least 10% limb volume reductions bilaterally during Intensive CDT to limit LE progression, to reduce pain, and to improve safe ambulation and functional mobility.   Baseline dependent   Time 12   Period Weeks   Status New     OT LONG TERM GOAL #4   Title Lymphedema (LE) management/ self-care:  Pt to tolerate daily compression wraps, garments and devices in keeping w/ prescribed wear regime within 1 week of issue date to progress and retain clinical and functional gains and to limit LE progression.   Baseline dependent   Time 12   Period Weeks   Status New     OT LONG TERM GOAL #5   Title Lymphedema (LE) management/ self-care:  During Management Phase CDT Pt to sustain current limb volumes within 5%, and all other clinical gains achieved during OT treatment with needed level of caregiver assistance to limit LE progression, infection risk and further functional decline.   Baseline dependent   Time 6   Period Months   Status New               Plan - 02/24/17 1610    Clinical Impression Statement Spouse did a very good job applying compression wraps over the weekend with instructions by Pt. Completed cell phone video on Pt's phone throughout session as teaching tool for spouse who is unable to attend abpointment today.  Limb volume reduction in LLE is dramatically decreased. Pt and spouse have been performing daily skin care during visit intervals. Good start to CDT! Cont as per POC.   Rehab Potential Good   OT Frequency 3x / week   OT Duration 12 weeks   OT Treatment/Interventions Self-care/ADL training;DME and/or AE instruction;Manual lymph drainage;Patient/family  education;Compression bandaging;Therapeutic exercises;Therapeutic activities;Therapeutic exercise;Energy conservation;Manual Therapy;Other (comment)  skin care   Consulted and Agree with Plan of Care Patient      Patient will benefit from skilled therapeutic intervention in order to improve the following deficits and impairments:  Abnormal gait, Decreased knowledge of use of DME, Decreased skin integrity, Increased edema, Impaired flexibility, Pain, Decreased mobility, Decreased scar mobility, Decreased endurance, Decreased range of motion, Decreased strength, Decreased balance, Decreased knowledge of precautions, Difficulty walking, Obesity  Visit Diagnosis: Lymphedema    Problem List Patient Active Problem List   Diagnosis Date Noted  . Cellulitis of leg, left 01/16/2017  . Cellulitis of leg, right 12/23/2016  . AKI (acute kidney injury) ([place]) 12/19/2016  . Hyponatremia 12/19/2016  . Pressure injury of skin 12/19/2016  . Encephalopathy, hepatic (  [place]) 11/20/2015  . Hepatic cirrhosis ([place]) 11/20/2015  . Thrombocytopenia ([place])   . Disorientation 11/19/2015  . Stroke or transient ischemic attack (TIA) diagnosed during current admission 11/19/2015  . OSA on CPAP 05/02/2015  . Hemorrhagic cystitis 01/29/2015  . Sepsis ([place]) 01/29/2015  . Flank pain 01/29/2015  . Sinus tachycardia 01/29/2015  . Lymphedema 01/29/2015  . Morbid obesity with body mass index of 50.0-59.9 in adult [facility]) 01/25/2015  . Obesity hypoventilation syndrome ([place]) 01/25/2015  . Snoring   . High cholesterol   . TIA (transient ischemic attack) 01/10/2015  . Morbid obesity ([place]) 01/10/2015  . Encephalopathy acute 01/09/2015  . HTN (hypertension) 01/09/2015  . Borderline diabetic 01/09/2015  . GERD (gastroesophageal reflux disease) 01/09/2015  . Acute encephalopathy     Andrey Spearman, MS, OTR/L, [facility] 02/24/17 4:15 PM  [place] MAIN [facility] SERVICES [address]  [place], [place], [address] Phone: [phone]   Fax:  [phone]  Name: Ryan Burgess MRN: [id number] Date of Birth: [birth year]

## 2017-02-24 NOTE — Telephone Encounter (Signed)
Pt stated that pharmacy request was incorrect for 3 tablets daily of 40mg .  Pt states that he has been taking 2 tablets of 20mg  tablets once daily.  Advised pt that RX for 40mg  tablets to take once daily was sent to pharmacy, which is the equivalent of the dosage he has been taking but that he is to ONLY take 1 tablet a day.  Pt expressed understanding and is agreeable.  Charyl Bigger, CMA

## 2017-02-26 ENCOUNTER — Ambulatory Visit: Payer: BLUE CROSS/BLUE SHIELD | Admitting: Occupational Therapy

## 2017-02-26 DIAGNOSIS — I890 Lymphedema, not elsewhere classified: Secondary | ICD-10-CM

## 2017-02-26 NOTE — Therapy (Signed)
[place] MAIN [facility] SERVICES [address] [place], [place], [address] Phone: [phone]   Fax:  [phone]  Occupational Therapy Treatment  Patient Details  Name: Ryan Burgess MRN: [id number] Date of Birth: 22-Oct-1971 Referring Provider: Christin Fudge, MD  Encounter Date: 02/26/2017      OT End of Session - 02/26/17 1421    Visit Number 5   Number of Visits 36   Date for OT Re-Evaluation 05/14/17   OT Start Time 2500   OT Stop Time 1215   OT Time Calculation (min) 70 min   Activity Tolerance Patient tolerated treatment well;No increased pain;Other (comment)  limited by body habitus   Behavior During Therapy [facility] for tasks assessed/performed      Past Medical History:  Diagnosis Date  . Cellulitis   . Cirrhosis, nonalcoholic ([place])   . GERD (gastroesophageal reflux disease)   . Headache    "maybe monthly" (12/19/2016)  . Hepatic encephalopathy ([place]) ~ 2016 X 2   "first time they thought it was a TIA; dx'd hepatic encephalopathy the 2nd time it happened"  . High cholesterol   . Hypertension   . Kidney stones 01/2015   UTI  . Migraine    "none in years; used to have them frequently" (12/19/2016)  . Obesity   . OSA on CPAP   . Pre-diabetes   . Snoring   . Venous stasis     Past Surgical History:  Procedure Laterality Date  . ESOPHAGOGASTRODUODENOSCOPY (EGD) WITH PROPOFOL N/A 07/26/2015   Procedure: ESOPHAGOGASTRODUODENOSCOPY (EGD) WITH PROPOFOL;  Surgeon: Arta Silence, MD;  [facility];  Service: Endoscopy;  Laterality: N/A;  . TEE WITHOUT CARDIOVERSION N/A 04/03/2015   Procedure: TRANSESOPHAGEAL ECHOCARDIOGRAM (TEE);  Surgeon: Adrian Prows, MD;  Location: [place];  Service: Cardiovascular;  Laterality: N/A;  . WISDOM TOOTH EXTRACTION  1990    There were no vitals filed for this visit.      Subjective Assessment - 02/26/17 1413    Subjective  Pt returns for OT visit 5/ 36 for CDT to BLE w/ emphasis on LLE   initially, then RLE. Pt has no new complaints. Pt arrives w/ LLE  knee length compression in place,. Pt states his wife assists daily as recommended.   Pertinent History Recent episode of LE cellulitis and sepsis w/ hospitalization; hx TIA, DM, morbid obesity, OSA, depression; non-smoker   Limitations Difficulty walking, difficulty w/ functional mobility and transfers, decreased ability to perform basic and instrumental ADLs (LB dressing and bathing, fitting and donning shoes and socks, difficulty performing shoping and  all home management tasks requiring standing and walking) Difficulty performing productive and work activities, restricted social participation in the community   Repetition Increases Symptoms   Patient Stated Goals decrease leg swelling and keep it from getting worse so I can do more   Currently in Pain? No/denies   Pain Onset Other (comment)  chronic                      OT Treatments/Exercises (OP) - 02/26/17 0001      ADLs   ADL Education Given Yes     Manual Therapy   Manual Therapy Edema management;Compression Bandaging   Edema Management LLE skin care w/ low pH Eucerin lotion   Compression Bandaging LLE compression wraps aplied to knee as established, then 15 cm wraps applied over 15 cm Rosidal to foam to groin.  OT Education - 02/26/17 1420    Education provided Yes   Education Details commenced Pt edu for entry level simple self MLD.    Person(s) Educated Patient   Methods Explanation;Demonstration;Tactile cues;Verbal cues;Handout   Comprehension Verbalized understanding;Need further instruction             OT Long Term Goals - 02/20/17 1007      OT LONG TERM GOAL #1   Title Pt independent w/ lymphedema precautions and prevention principals and strategies to limit LE progression and infection risk.   Baseline dependent   Time 1   Period Weeks   Status New     OT LONG TERM GOAL #2   Title Lymphedema (LE)  management/ self-care: Pt able to apply multi layered, gradient compression wraps w Max caregiver assistance using proper techniques within 2 weeks to achieve optimal limb volume reductions bilaterally.   Baseline dependent   Time 2   Period Weeks   Status New     OT LONG TERM GOAL #3   Title Lymphedema (LE) management/ self-care:  Pt to achieve at least 10% limb volume reductions bilaterally during Intensive CDT to limit LE progression, to reduce pain, and to improve safe ambulation and functional mobility.   Baseline dependent   Time 12   Period Weeks   Status New     OT LONG TERM GOAL #4   Title Lymphedema (LE) management/ self-care:  Pt to tolerate daily compression wraps, garments and devices in keeping w/ prescribed wear regime within 1 week of issue date to progress and retain clinical and functional gains and to limit LE progression.   Baseline dependent   Time 12   Period Weeks   Status New     OT LONG TERM GOAL #5   Title Lymphedema (LE) management/ self-care:  During Management Phase CDT Pt to sustain current limb volumes within 5%, and all other clinical gains achieved during OT treatment with needed level of caregiver assistance to limit LE progression, infection risk and further functional decline.   Baseline dependent   Time 6   Period Months   Status New               Plan - 02/26/17 1421    Clinical Impression Statement Pt continues to perform diligent skin care and compression wrapping between visits w/ spouse's assistance, Pt participated in entry level edu for simple self MLD today. Emphasis f session today, in addidtion to introducing MLD, was extending compression wraps above knee to groin today. Rehab aid  was able to assist during session today with wrap application. Cont as per POC. Adjust compression PRN to improve fit and function.   Rehab Potential Good   OT Frequency 3x / week   OT Duration 12 weeks   OT Treatment/Interventions Self-care/ADL  training;DME and/or AE instruction;Manual lymph drainage;Patient/family education;Compression bandaging;Therapeutic exercises;Therapeutic activities;Therapeutic exercise;Energy conservation;Manual Therapy;Other (comment)  skin care   Consulted and Agree with Plan of Care Patient      Patient will benefit from skilled therapeutic intervention in order to improve the following deficits and impairments:  Abnormal gait, Decreased knowledge of use of DME, Decreased skin integrity, Increased edema, Impaired flexibility, Pain, Decreased mobility, Decreased scar mobility, Decreased endurance, Decreased range of motion, Decreased strength, Decreased balance, Decreased knowledge of precautions, Difficulty walking, Obesity  Visit Diagnosis: Lymphedema    Problem List Patient Active Problem List   Diagnosis Date Noted  . Cellulitis of leg, left 01/16/2017  . Cellulitis of leg, right  12/23/2016  . AKI (acute kidney injury) ([place]) 12/19/2016  . Hyponatremia 12/19/2016  . Pressure injury of skin 12/19/2016  . Encephalopathy, hepatic ([place]) 11/20/2015  . Hepatic cirrhosis ([place]) 11/20/2015  . Thrombocytopenia ([place])   . Disorientation 11/19/2015  . Stroke or transient ischemic attack (TIA) diagnosed during current admission 11/19/2015  . OSA on CPAP 05/02/2015  . Hemorrhagic cystitis 01/29/2015  . Sepsis ([place]) 01/29/2015  . Flank pain 01/29/2015  . Sinus tachycardia 01/29/2015  . Lymphedema 01/29/2015  . Morbid obesity with body mass index of 50.0-59.9 in adult [facility]) 01/25/2015  . Obesity hypoventilation syndrome ([place]) 01/25/2015  . Snoring   . High cholesterol   . TIA (transient ischemic attack) 01/10/2015  . Morbid obesity ([place]) 01/10/2015  . Encephalopathy acute 01/09/2015  . HTN (hypertension) 01/09/2015  . Borderline diabetic 01/09/2015  . GERD (gastroesophageal reflux disease) 01/09/2015  . Acute encephalopathy     Andrey Spearman, MS, OTR/L, [facility] 02/26/17 2:25 PM  [place] MAIN [facility] SERVICES [address] [place], [place], [address] Phone: [phone]   Fax:  [phone]  Name: Ryan Burgess MRN: [id number] Date of Birth: January 10, 1971

## 2017-02-27 ENCOUNTER — Ambulatory Visit: Payer: BLUE CROSS/BLUE SHIELD | Admitting: Occupational Therapy

## 2017-02-27 DIAGNOSIS — I890 Lymphedema, not elsewhere classified: Secondary | ICD-10-CM | POA: Diagnosis not present

## 2017-02-27 NOTE — Therapy (Signed)
[place] MAIN [facility] SERVICES [address] [place], [place], [address] Phone: [phone]   Fax:  [phone]  Occupational Therapy Treatment  Patient Details  Name: Ryan Burgess MRN: [id number] Date of Birth: [birth year] Referring Provider: Christin Fudge, MD  Encounter Date: 02/27/2017      OT End of Session - 02/27/17 1200    Visit Number 6   Number of Visits 36   Date for OT Re-Evaluation 05/14/17   OT Start Time 0905   OT Stop Time 1015   OT Time Calculation (min) 70 min   Activity Tolerance Patient tolerated treatment well;No increased pain;Other (comment)  limited by body habitus   Behavior During Therapy [facility] for tasks assessed/performed      Past Medical History:  Diagnosis Date  . Cellulitis   . Cirrhosis, nonalcoholic ([place])   . GERD (gastroesophageal reflux disease)   . Headache    "maybe monthly" (12/19/2016)  . Hepatic encephalopathy ([place]) ~ 2016 X 2   "first time they thought it was a TIA; dx'd hepatic encephalopathy the 2nd time it happened"  . High cholesterol   . Hypertension   . Kidney stones 01/2015   UTI  . Migraine    "none in years; used to have them frequently" (12/19/2016)  . Obesity   . OSA on CPAP   . Pre-diabetes   . Snoring   . Venous stasis     Past Surgical History:  Procedure Laterality Date  . ESOPHAGOGASTRODUODENOSCOPY (EGD) WITH PROPOFOL N/A 07/26/2015   Procedure: ESOPHAGOGASTRODUODENOSCOPY (EGD) WITH PROPOFOL;  Surgeon: Arta Silence, MD;  [facility];  Service: Endoscopy;  Laterality: N/A;  . TEE WITHOUT CARDIOVERSION N/A 04/03/2015   Procedure: TRANSESOPHAGEAL ECHOCARDIOGRAM (TEE);  Surgeon: Adrian Prows, MD;  Location: [place];  Service: Cardiovascular;  Laterality: N/A;  . WISDOM TOOTH EXTRACTION  1990    There were no vitals filed for this visit.      Subjective Assessment - 02/27/17 1200    Subjective  Pt returns for OT visit 6/ 36 for CDT to BLE w/ emphasis on LLE   initially, then RLE. Pt has no new complaints. Pt arrives w/ LLE  thigh length compression in place, but top edge sliding down.   Pertinent History Recent episode of LE cellulitis and sepsis w/ hospitalization; hx TIA, DM, morbid obesity, OSA, depression; non-smoker   Limitations Difficulty walking, difficulty w/ functional mobility and transfers, decreased ability to perform basic and instrumental ADLs (LB dressing and bathing, fitting and donning shoes and socks, difficulty performing shoping and  all home management tasks requiring standing and walking) Difficulty performing productive and work activities, restricted social participation in the community   Repetition Increases Symptoms   Patient Stated Goals decrease leg swelling and keep it from getting worse so I can do more   Currently in Pain? No/denies   Pain Onset Other (comment)  chronic                      OT Treatments/Exercises (OP) - 02/27/17 0001      ADLs   ADL Education Given Yes     Manual Therapy   Manual Therapy Edema management;Compression Bandaging   Edema Management LLE skin care w/ low pH Eucerin lotion   Compression Bandaging LLE compression wraps aplied to knee as established, then 15 cm wraps applied over 15 cm Rosidal to foam to groin.  OT Education - 02/27/17 1201    Education provided Yes   Education Details Continued skilled Pt/caregiver education  And LE ADL training throughout visit for lymphedema self care/ home program, including compression wrapping, compression garment and device wear/care, lymphatic pumping ther ex, simple self-MLD, and skin care. Discussed progress towards goals.   Person(s) Educated Patient   Methods Explanation;Demonstration;Handout   Comprehension Verbalized understanding             OT Long Term Goals - 02/20/17 1007      OT LONG TERM GOAL #1   Title Pt independent w/ lymphedema precautions and prevention principals and strategies  to limit LE progression and infection risk.   Baseline dependent   Time 1   Period Weeks   Status New     OT LONG TERM GOAL #2   Title Lymphedema (LE) management/ self-care: Pt able to apply multi layered, gradient compression wraps w Max caregiver assistance using proper techniques within 2 weeks to achieve optimal limb volume reductions bilaterally.   Baseline dependent   Time 2   Period Weeks   Status New     OT LONG TERM GOAL #3   Title Lymphedema (LE) management/ self-care:  Pt to achieve at least 10% limb volume reductions bilaterally during Intensive CDT to limit LE progression, to reduce pain, and to improve safe ambulation and functional mobility.   Baseline dependent   Time 12   Period Weeks   Status New     OT LONG TERM GOAL #4   Title Lymphedema (LE) management/ self-care:  Pt to tolerate daily compression wraps, garments and devices in keeping w/ prescribed wear regime within 1 week of issue date to progress and retain clinical and functional gains and to limit LE progression.   Baseline dependent   Time 12   Period Weeks   Status New     OT LONG TERM GOAL #5   Title Lymphedema (LE) management/ self-care:  During Management Phase CDT Pt to sustain current limb volumes within 5%, and all other clinical gains achieved during OT treatment with needed level of caregiver assistance to limit LE progression, infection risk and further functional decline.   Baseline dependent   Time 6   Period Months   Status New               Plan - 02/27/17 1202    Clinical Impression Statement Pt has achieved dramatic limb volume reductions both above and below the knee this week, his first week of CDT. He has tolerated all aspects of CDT in clinic and his spouse continues to diligently assist w/ skin care and compression wraps between visits. Pt in agreement w/ plan to complete volumetric measurements next week to measure progress. Cont as per POC./   Rehab Potential Good   OT  Frequency 3x / week   OT Duration 12 weeks   OT Treatment/Interventions Self-care/ADL training;DME and/or AE instruction;Manual lymph drainage;Patient/family education;Compression bandaging;Therapeutic exercises;Therapeutic activities;Therapeutic exercise;Energy conservation;Manual Therapy;Other (comment)  skin care   Consulted and Agree with Plan of Care Patient      Patient will benefit from skilled therapeutic intervention in order to improve the following deficits and impairments:  Abnormal gait, Decreased knowledge of use of DME, Decreased skin integrity, Increased edema, Impaired flexibility, Pain, Decreased mobility, Decreased scar mobility, Decreased endurance, Decreased range of motion, Decreased strength, Decreased balance, Decreased knowledge of precautions, Difficulty walking, Obesity  Visit Diagnosis: Lymphedema    Problem List Patient Active Problem List  Diagnosis Date Noted  . Cellulitis of leg, left 01/16/2017  . Cellulitis of leg, right 12/23/2016  . AKI (acute kidney injury) ([place]) 12/19/2016  . Hyponatremia 12/19/2016  . Pressure injury of skin 12/19/2016  . Encephalopathy, hepatic ([place]) 11/20/2015  . Hepatic cirrhosis ([place]) 11/20/2015  . Thrombocytopenia ([place])   . Disorientation 11/19/2015  . Stroke or transient ischemic attack (TIA) diagnosed during current admission 11/19/2015  . OSA on CPAP 05/02/2015  . Hemorrhagic cystitis 01/29/2015  . Sepsis ([place]) 01/29/2015  . Flank pain 01/29/2015  . Sinus tachycardia 01/29/2015  . Lymphedema 01/29/2015  . Morbid obesity with body mass index of 50.0-59.9 in adult [facility]) 01/25/2015  . Obesity hypoventilation syndrome ([place]) 01/25/2015  . Snoring   . High cholesterol   . TIA (transient ischemic attack) 01/10/2015  . Morbid obesity ([place]) 01/10/2015  . Encephalopathy acute 01/09/2015  . HTN (hypertension) 01/09/2015  . Borderline diabetic 01/09/2015  . GERD (gastroesophageal reflux disease) 01/09/2015  . Acute  encephalopathy    Andrey Spearman, MS, OTR/L, [facility] 02/27/17 12:04 PM   [place] MAIN [facility] SERVICES [address] [place], [place], [address] Phone: [phone]   Fax:  [phone]  Name: Ryan Burgess MRN: [id number] Date of Birth: [birth year]

## 2017-03-03 ENCOUNTER — Ambulatory Visit: Payer: BLUE CROSS/BLUE SHIELD | Attending: Surgery | Admitting: Occupational Therapy

## 2017-03-03 DIAGNOSIS — I890 Lymphedema, not elsewhere classified: Secondary | ICD-10-CM | POA: Diagnosis present

## 2017-03-03 NOTE — Therapy (Signed)
[place] MAIN [facility] SERVICES [address] [place], [place], [address] Phone: [phone]   Fax:  [phone]  Occupational Therapy Treatment  Patient Details  Name: Ryan Burgess MRN: [id number] Date of Birth: 11-05-1971 Referring Provider: Christin Fudge, MD  Encounter Date: 03/03/2017      OT End of Session - 03/03/17 1650    Visit Number 7   Number of Visits 36   Date for OT Re-Evaluation 05/14/17   OT Start Time 0200   OT Stop Time 0300   OT Time Calculation (min) 60 min   Activity Tolerance Patient tolerated treatment well;No increased pain;Other (comment)  limited by body habitus   Behavior During Therapy [facility] for tasks assessed/performed      Past Medical History:  Diagnosis Date  . Cellulitis   . Cirrhosis, nonalcoholic ([place])   . GERD (gastroesophageal reflux disease)   . Headache    "maybe monthly" (12/19/2016)  . Hepatic encephalopathy ([place]) ~ 2016 X 2   "first time they thought it was a TIA; dx'd hepatic encephalopathy the 2nd time it happened"  . High cholesterol   . Hypertension   . Kidney stones 01/2015   UTI  . Migraine    "none in years; used to have them frequently" (12/19/2016)  . Obesity   . OSA on CPAP   . Pre-diabetes   . Snoring   . Venous stasis     Past Surgical History:  Procedure Laterality Date  . ESOPHAGOGASTRODUODENOSCOPY (EGD) WITH PROPOFOL N/A 07/26/2015   Procedure: ESOPHAGOGASTRODUODENOSCOPY (EGD) WITH PROPOFOL;  Surgeon: Arta Silence, MD;  [facility];  Service: Endoscopy;  Laterality: N/A;  . TEE WITHOUT CARDIOVERSION N/A 04/03/2015   Procedure: TRANSESOPHAGEAL ECHOCARDIOGRAM (TEE);  Surgeon: Adrian Prows, MD;  Location: [place];  Service: Cardiovascular;  Laterality: N/A;  . WISDOM TOOTH EXTRACTION  1990    There were no vitals filed for this visit.      Subjective Assessment - 03/03/17 1647    Subjective  Pt returns for OT visit 7/ 36 for CDT to BLE w/ emphasis on LLE   initially, then RLE. Pt has no new complaints. Pt arrives w/ LLE  thigh length compression in place, but top edge sliding down.   Pertinent History Recent episode of LE cellulitis and sepsis w/ hospitalization; hx TIA, DM, morbid obesity, OSA, depression; non-smoker   Limitations Difficulty walking, difficulty w/ functional mobility and transfers, decreased ability to perform basic and instrumental ADLs (LB dressing and bathing, fitting and donning shoes and socks, difficulty performing shoping and  all home management tasks requiring standing and walking) Difficulty performing productive and work activities, restricted social participation in the community   Repetition Increases Symptoms   Patient Stated Goals decrease leg swelling and keep it from getting worse so I can do more   Pain Onset Other (comment)  chronic                      OT Treatments/Exercises (OP) - 03/03/17 0001      ADLs   ADL Education Given Yes     Manual Therapy   Manual Therapy Edema management;Compression Bandaging   Compression Bandaging LLE compression wraps aplied to knee as established, then 15 cm wraps applied over 15 cm Rosidal to foam to groin.                OT Education - 03/03/17 1648    Education provided Yes   Education Details  cont Pt and CG edu for compression wrapping to LLE.   Person(s) Educated Patient;Spouse   Methods Explanation;Demonstration;Tactile cues;Verbal cues;Handout   Comprehension Verbalized understanding;Returned demonstration;Verbal cues required;Tactile cues required;Need further instruction             OT Long Term Goals - 02/20/17 1007      OT LONG TERM GOAL #1   Title Pt independent w/ lymphedema precautions and prevention principals and strategies to limit LE progression and infection risk.   Baseline dependent   Time 1   Period Weeks   Status New     OT LONG TERM GOAL #2   Title Lymphedema (LE) management/ self-care: Pt able to apply  multi layered, gradient compression wraps w Max caregiver assistance using proper techniques within 2 weeks to achieve optimal limb volume reductions bilaterally.   Baseline dependent   Time 2   Period Weeks   Status New     OT LONG TERM GOAL #3   Title Lymphedema (LE) management/ self-care:  Pt to achieve at least 10% limb volume reductions bilaterally during Intensive CDT to limit LE progression, to reduce pain, and to improve safe ambulation and functional mobility.   Baseline dependent   Time 12   Period Weeks   Status New     OT LONG TERM GOAL #4   Title Lymphedema (LE) management/ self-care:  Pt to tolerate daily compression wraps, garments and devices in keeping w/ prescribed wear regime within 1 week of issue date to progress and retain clinical and functional gains and to limit LE progression.   Baseline dependent   Time 12   Period Weeks   Status New     OT LONG TERM GOAL #5   Title Lymphedema (LE) management/ self-care:  During Management Phase CDT Pt to sustain current limb volumes within 5%, and all other clinical gains achieved during OT treatment with needed level of caregiver assistance to limit LE progression, infection risk and further functional decline.   Baseline dependent   Time 6   Period Months   Status New               Plan - 03/03/17 1650    Clinical Impression Statement LLE swelling is dramatically increased above the knee today. Spouse had difficulty re-applying compression wraps above the knee over the weekend, so she attended session today to learn techniques. By end of session with skilled LE ADL training, spouse  wwas able to apply E-G wraps with moderate OT assistance. They will continue to work with this at home between visits, and she will attend additional visits if unable to manage this part of home program adequately. Cont as per POC.   Rehab Potential Good   OT Frequency 3x / week   OT Duration 12 weeks   OT Treatment/Interventions  Self-care/ADL training;DME and/or AE instruction;Manual lymph drainage;Patient/family education;Compression bandaging;Therapeutic exercises;Therapeutic activities;Therapeutic exercise;Energy conservation;Manual Therapy;Other (comment)  skin care   Consulted and Agree with Plan of Care Patient      Patient will benefit from skilled therapeutic intervention in order to improve the following deficits and impairments:  Abnormal gait, Decreased knowledge of use of DME, Decreased skin integrity, Increased edema, Impaired flexibility, Pain, Decreased mobility, Decreased scar mobility, Decreased endurance, Decreased range of motion, Decreased strength, Decreased balance, Decreased knowledge of precautions, Difficulty walking, Obesity  Visit Diagnosis: Lymphedema, not elsewhere classified  Lymphedema    Problem List Patient Active Problem List   Diagnosis Date Noted  . Cellulitis of leg, left  01/16/2017  . Cellulitis of leg, right 12/23/2016  . AKI (acute kidney injury) ([place]) 12/19/2016  . Hyponatremia 12/19/2016  . Pressure injury of skin 12/19/2016  . Encephalopathy, hepatic ([place]) 11/20/2015  . Hepatic cirrhosis ([place]) 11/20/2015  . Thrombocytopenia ([place])   . Disorientation 11/19/2015  . Stroke or transient ischemic attack (TIA) diagnosed during current admission 11/19/2015  . OSA on CPAP 05/02/2015  . Hemorrhagic cystitis 01/29/2015  . Sepsis ([place]) 01/29/2015  . Flank pain 01/29/2015  . Sinus tachycardia 01/29/2015  . Lymphedema 01/29/2015  . Morbid obesity with body mass index of 50.0-59.9 in adult [facility]) 01/25/2015  . Obesity hypoventilation syndrome ([place]) 01/25/2015  . Snoring   . High cholesterol   . TIA (transient ischemic attack) 01/10/2015  . Morbid obesity ([place]) 01/10/2015  . Encephalopathy acute 01/09/2015  . HTN (hypertension) 01/09/2015  . Borderline diabetic 01/09/2015  . GERD (gastroesophageal reflux disease) 01/09/2015  . Acute encephalopathy     Andrey Spearman,  MS, OTR/L, [facility] 03/03/17 4:54 PM  [place] MAIN [facility] SERVICES [address] [place], [place], [address] Phone: [phone]   Fax:  [phone]  Name: Ryan Burgess MRN: [id number] Date of Birth: [birth year]

## 2017-03-04 ENCOUNTER — Ambulatory Visit: Payer: BLUE CROSS/BLUE SHIELD | Admitting: Occupational Therapy

## 2017-03-04 DIAGNOSIS — I890 Lymphedema, not elsewhere classified: Secondary | ICD-10-CM | POA: Diagnosis not present

## 2017-03-05 ENCOUNTER — Ambulatory Visit: Payer: BLUE CROSS/BLUE SHIELD | Admitting: Occupational Therapy

## 2017-03-05 DIAGNOSIS — I890 Lymphedema, not elsewhere classified: Secondary | ICD-10-CM

## 2017-03-05 NOTE — Therapy (Signed)
[place] MAIN [facility] SERVICES [address] [place], [place], [address] Phone: [phone]   Fax:  [phone]  Occupational Therapy Treatment  Patient Details  Name: Ryan Burgess MRN: [id number] Date of Birth: 1971-02-04 Referring Provider: Christin Fudge, MD  Encounter Date: 03/05/2017      OT End of Session - 03/05/17 1710    Visit Number 8   Number of Visits 36   Date for OT Re-Evaluation 05/14/17   OT Start Time 0110   OT Stop Time 0230   OT Time Calculation (min) 80 min   Activity Tolerance Patient tolerated treatment well;No increased pain;Other (comment)  limited by body habitus   Behavior During Therapy [facility] for tasks assessed/performed      Past Medical History:  Diagnosis Date  . Cellulitis   . Cirrhosis, nonalcoholic ([place])   . GERD (gastroesophageal reflux disease)   . Headache    "maybe monthly" (12/19/2016)  . Hepatic encephalopathy ([place]) ~ 2016 X 2   "first time they thought it was a TIA; dx'd hepatic encephalopathy the 2nd time it happened"  . High cholesterol   . Hypertension   . Kidney stones 01/2015   UTI  . Migraine    "none in years; used to have them frequently" (12/19/2016)  . Obesity   . OSA on CPAP   . Pre-diabetes   . Snoring   . Venous stasis     Past Surgical History:  Procedure Laterality Date  . ESOPHAGOGASTRODUODENOSCOPY (EGD) WITH PROPOFOL N/A 07/26/2015   Procedure: ESOPHAGOGASTRODUODENOSCOPY (EGD) WITH PROPOFOL;  Surgeon: Arta Silence, MD;  [facility]: [facility];  Service: Endoscopy;  Laterality: N/A;  . TEE WITHOUT CARDIOVERSION N/A 04/03/2015   Procedure: TRANSESOPHAGEAL ECHOCARDIOGRAM (TEE);  Surgeon: Adrian Prows, MD;  Location: [place];  Service: Cardiovascular;  Laterality: N/A;  . WISDOM TOOTH EXTRACTION  1990    There were no vitals filed for this visit.      Subjective Assessment - 03/05/17 1708    Subjective  Pt returns for OT visit 8/ 36 for CDT to BLE w/ emphasis on LLE   initially, then RLE. Pt reports wraps stay up fairly well all day, then he wakes in the morning w/ wraps fallen down to knees.   Pertinent History Recent episode of LE cellulitis and sepsis w/ hospitalization; hx TIA, DM, morbid obesity, OSA, depression; non-smoker   Limitations Difficulty walking, difficulty w/ functional mobility and transfers, decreased ability to perform basic and instrumental ADLs (LB dressing and bathing, fitting and donning shoes and socks, difficulty performing shoping and  all home management tasks requiring standing and walking) Difficulty performing productive and work activities, restricted social participation in the community   Repetition Increases Symptoms   Patient Stated Goals decrease leg swelling and keep it from getting worse so I can do more   Currently in Pain? No/denies   Pain Onset Other (comment)  chronic                      OT Treatments/Exercises (OP) - 03/05/17 0001      ADLs   ADL Education Given Yes     Manual Therapy   Manual Therapy Edema management;Compression Bandaging   Compression Bandaging LLE compression wraps aplied to knee as established, then 15 cm wraps applied over 15 cm Rosidal to foam to groin.                OT Education - 03/05/17 1709  Education provided Yes   Person(s) Educated Patient   Methods Explanation;Demonstration   Comprehension Verbalized understanding;Need further instruction             OT Long Term Goals - 02/20/17 1007      OT LONG TERM GOAL #1   Title Pt independent w/ lymphedema precautions and prevention principals and strategies to limit LE progression and infection risk.   Baseline dependent   Time 1   Period Weeks   Status New     OT LONG TERM GOAL #2   Title Lymphedema (LE) management/ self-care: Pt able to apply multi layered, gradient compression wraps w Max caregiver assistance using proper techniques within 2 weeks to achieve optimal limb volume reductions  bilaterally.   Baseline dependent   Time 2   Period Weeks   Status New     OT LONG TERM GOAL #3   Title Lymphedema (LE) management/ self-care:  Pt to achieve at least 10% limb volume reductions bilaterally during Intensive CDT to limit LE progression, to reduce pain, and to improve safe ambulation and functional mobility.   Baseline dependent   Time 12   Period Weeks   Status New     OT LONG TERM GOAL #4   Title Lymphedema (LE) management/ self-care:  Pt to tolerate daily compression wraps, garments and devices in keeping w/ prescribed wear regime within 1 week of issue date to progress and retain clinical and functional gains and to limit LE progression.   Baseline dependent   Time 12   Period Weeks   Status New     OT LONG TERM GOAL #5   Title Lymphedema (LE) management/ self-care:  During Management Phase CDT Pt to sustain current limb volumes within 5%, and all other clinical gains achieved during OT treatment with needed level of caregiver assistance to limit LE progression, infection risk and further functional decline.   Baseline dependent   Time 6   Period Months   Status New               Plan - 03/05/17 1710    Clinical Impression Statement Pt managing skin care and compression wraps between visits with max assist by spouse. Skin hydration continues to improve with dense , tough patches around ankles and on shin softening and flaking off as volume reduces. Extreme shape of leg presents significant obstacles to compression wrapping above the knee,. This area remains very dense and hard. Will continue to experiment with compression above knee until all  options are exhausted.   Rehab Potential Good   OT Frequency 3x / week   OT Duration 12 weeks   OT Treatment/Interventions Self-care/ADL training;DME and/or AE instruction;Manual lymph drainage;Patient/family education;Compression bandaging;Therapeutic exercises;Therapeutic activities;Therapeutic exercise;Energy  conservation;Manual Therapy;Other (comment)  skin care   Consulted and Agree with Plan of Care Patient      Patient will benefit from skilled therapeutic intervention in order to improve the following deficits and impairments:  Abnormal gait, Decreased knowledge of use of DME, Decreased skin integrity, Increased edema, Impaired flexibility, Pain, Decreased mobility, Decreased scar mobility, Decreased endurance, Decreased range of motion, Decreased strength, Decreased balance, Decreased knowledge of precautions, Difficulty walking, Obesity  Visit Diagnosis: Lymphedema, not elsewhere classified    Problem List Patient Active Problem List   Diagnosis Date Noted  . Cellulitis of leg, left 01/16/2017  . Cellulitis of leg, right 12/23/2016  . AKI (acute kidney injury) ([place]) 12/19/2016  . Hyponatremia 12/19/2016  . Pressure injury of skin 12/19/2016  .  Encephalopathy, hepatic ([place]) 11/20/2015  . Hepatic cirrhosis ([place]) 11/20/2015  . Thrombocytopenia ([place])   . Disorientation 11/19/2015  . Stroke or transient ischemic attack (TIA) diagnosed during current admission 11/19/2015  . OSA on CPAP 05/02/2015  . Hemorrhagic cystitis 01/29/2015  . Sepsis ([place]) 01/29/2015  . Flank pain 01/29/2015  . Sinus tachycardia 01/29/2015  . Lymphedema 01/29/2015  . Morbid obesity with body mass index of 50.0-59.9 in adult [facility]) 01/25/2015  . Obesity hypoventilation syndrome ([place]) 01/25/2015  . Snoring   . High cholesterol   . TIA (transient ischemic attack) 01/10/2015  . Morbid obesity ([place]) 01/10/2015  . Encephalopathy acute 01/09/2015  . HTN (hypertension) 01/09/2015  . Borderline diabetic 01/09/2015  . GERD (gastroesophageal reflux disease) 01/09/2015  . Acute encephalopathy     Andrey Spearman, MS, OTR/L, [facility] 03/05/17 5:14 PM  [place] MAIN [facility] SERVICES [address] [place], [place], [address] Phone: [phone]   Fax:  [phone]  Name:  Ryan Burgess MRN: [id number] Date of Birth: Oct 07, 1971

## 2017-03-06 ENCOUNTER — Encounter: Payer: BLUE CROSS/BLUE SHIELD | Admitting: Occupational Therapy

## 2017-03-07 ENCOUNTER — Other Ambulatory Visit: Payer: Self-pay | Admitting: Gastroenterology

## 2017-03-07 DIAGNOSIS — K7469 Other cirrhosis of liver: Secondary | ICD-10-CM

## 2017-03-07 DIAGNOSIS — C220 Liver cell carcinoma: Secondary | ICD-10-CM

## 2017-03-10 ENCOUNTER — Encounter: Payer: BLUE CROSS/BLUE SHIELD | Admitting: Occupational Therapy

## 2017-03-11 ENCOUNTER — Ambulatory Visit: Payer: BLUE CROSS/BLUE SHIELD | Admitting: Occupational Therapy

## 2017-03-11 DIAGNOSIS — I890 Lymphedema, not elsewhere classified: Secondary | ICD-10-CM | POA: Diagnosis not present

## 2017-03-11 NOTE — Therapy (Signed)
[place] MAIN [facility] SERVICES [address] [place], [place], [address] Phone: [phone]   Fax:  [phone]  Occupational Therapy Treatment  Patient Details  Name: Ryan Burgess MRN: [id number] Date of Birth: [birth year] Referring Provider: Christin Fudge, MD  Encounter Date: 03/11/2017      OT End of Session - 03/11/17 1529    Visit Number 9   Number of Visits 36   Date for OT Re-Evaluation 05/14/17   OT Start Time 0212   OT Stop Time 0315   OT Time Calculation (min) 63 min   Activity Tolerance Patient tolerated treatment well;No increased pain;Other (comment)  limited by body habitus   Behavior During Therapy [facility] for tasks assessed/performed      Past Medical History:  Diagnosis Date  . Cellulitis   . Cirrhosis, nonalcoholic ([place])   . GERD (gastroesophageal reflux disease)   . Headache    "maybe monthly" (12/19/2016)  . Hepatic encephalopathy ([place]) ~ 2016 X 2   "first time they thought it was a TIA; dx'd hepatic encephalopathy the 2nd time it happened"  . High cholesterol   . Hypertension   . Kidney stones 01/2015   UTI  . Migraine    "none in years; used to have them frequently" (12/19/2016)  . Obesity   . OSA on CPAP   . Pre-diabetes   . Snoring   . Venous stasis     Past Surgical History:  Procedure Laterality Date  . ESOPHAGOGASTRODUODENOSCOPY (EGD) WITH PROPOFOL N/A 07/26/2015   Procedure: ESOPHAGOGASTRODUODENOSCOPY (EGD) WITH PROPOFOL;  Surgeon: Arta Silence, MD;  [facility];  Service: Endoscopy;  Laterality: N/A;  . TEE WITHOUT CARDIOVERSION N/A 04/03/2015   Procedure: TRANSESOPHAGEAL ECHOCARDIOGRAM (TEE);  Surgeon: Adrian Prows, MD;  Location: [place];  Service: Cardiovascular;  Laterality: N/A;  . WISDOM TOOTH EXTRACTION  1990    There were no vitals filed for this visit.      Subjective Assessment - 03/11/17 1525    Subjective  Pt returns for OT visit 9/ 36 for CDT to BLE w/ emphasis on LLE   initially, then RLE. Pt reports wraps tcontinue to slide down several inches from the top edge over the course of 24 hours in them. Pt states he is pleased with progress  made thus far on LLE limb volume reduction.   Pertinent History Recent episode of LE cellulitis and sepsis w/ hospitalization; hx TIA, DM, morbid obesity, OSA, depression; non-smoker   Limitations Difficulty walking, difficulty w/ functional mobility and transfers, decreased ability to perform basic and instrumental ADLs (LB dressing and bathing, fitting and donning shoes and socks, difficulty performing shoping and  all home management tasks requiring standing and walking) Difficulty performing productive and work activities, restricted social participation in the community   Repetition Increases Symptoms   Patient Stated Goals decrease leg swelling and keep it from getting worse so I can do more   Currently in Pain? No/denies   Pain Onset Other (comment)  chronic                      OT Treatments/Exercises (OP) - 03/11/17 0001      ADLs   ADL Education Given Yes     Manual Therapy   Manual Therapy Edema management;Compression Bandaging   Edema Management LLE skin care w/ low pH Eucerin lotion   Compression Bandaging LLE compression wraps aplied to knee as established, then 15 cm wraps applied over 15 cm  Rosidal to foam to groin.                OT Education - 03/11/17 1527    Education provided Yes   Education Details Reviewed LE precautions re BLE compression. Pt verbalized understanding. Printed copy of precautions already given. Continued skilled Pt/caregiver education  And LE ADL training throughout visit for lymphedema self care/ home program, including compression wrapping, compression garment and device wear/care, lymphatic pumping ther ex, simple self-MLD, and skin care. Discussed progress towards goals.   Person(s) Educated Patient   Methods Explanation;Demonstration;Tactile cues;Verbal  cues;Handout   Comprehension Verbalized understanding;Returned demonstration;Need further instruction             OT Long Term Goals - 02/20/17 1007      OT LONG TERM GOAL #1   Title Pt independent w/ lymphedema precautions and prevention principals and strategies to limit LE progression and infection risk.   Baseline dependent   Time 1   Period Weeks   Status New     OT LONG TERM GOAL #2   Title Lymphedema (LE) management/ self-care: Pt able to apply multi layered, gradient compression wraps w Max caregiver assistance using proper techniques within 2 weeks to achieve optimal limb volume reductions bilaterally.   Baseline dependent   Time 2   Period Weeks   Status New     OT LONG TERM GOAL #3   Title Lymphedema (LE) management/ self-care:  Pt to achieve at least 10% limb volume reductions bilaterally during Intensive CDT to limit LE progression, to reduce pain, and to improve safe ambulation and functional mobility.   Baseline dependent   Time 12   Period Weeks   Status New     OT LONG TERM GOAL #4   Title Lymphedema (LE) management/ self-care:  Pt to tolerate daily compression wraps, garments and devices in keeping w/ prescribed wear regime within 1 week of issue date to progress and retain clinical and functional gains and to limit LE progression.   Baseline dependent   Time 12   Period Weeks   Status New     OT LONG TERM GOAL #5   Title Lymphedema (LE) management/ self-care:  During Management Phase CDT Pt to sustain current limb volumes within 5%, and all other clinical gains achieved during OT treatment with needed level of caregiver assistance to limit LE progression, infection risk and further functional decline.   Baseline dependent   Time 6   Period Months   Status New               Plan - 03/11/17 1530    Clinical Impression Statement Pt and spouse have done a very nice jo of perfecting compression wrapping skills as limb volume is notably decreased  since last visit about 1 week ago. Pt continues to have a difficult time keeping compression wraps up at top edge, still thigh volume is much decreased and thigh length compression is very helpful. Pt in agreement with plan to request [employer] assist w/ compression garment specs and measurements to ensure optimal fit and function.   Rehab Potential Good   OT Frequency 3x / week   OT Duration 12 weeks   OT Treatment/Interventions Self-care/ADL training;DME and/or AE instruction;Manual lymph drainage;Patient/family education;Compression bandaging;Therapeutic exercises;Therapeutic activities;Therapeutic exercise;Energy conservation;Manual Therapy;Other (comment)  skin care   Consulted and Agree with Plan of Care Patient      Patient will benefit from skilled therapeutic intervention in order to improve the following deficits and  impairments:  Abnormal gait, Decreased knowledge of use of DME, Decreased skin integrity, Increased edema, Impaired flexibility, Pain, Decreased mobility, Decreased scar mobility, Decreased endurance, Decreased range of motion, Decreased strength, Decreased balance, Decreased knowledge of precautions, Difficulty walking, Obesity  Visit Diagnosis: Lymphedema, not elsewhere classified    Problem List Patient Active Problem List   Diagnosis Date Noted  . Cellulitis of leg, left 01/16/2017  . Cellulitis of leg, right 12/23/2016  . AKI (acute kidney injury) ([place]) 12/19/2016  . Hyponatremia 12/19/2016  . Pressure injury of skin 12/19/2016  . Encephalopathy, hepatic ([place]) 11/20/2015  . Hepatic cirrhosis ([place]) 11/20/2015  . Thrombocytopenia ([place])   . Disorientation 11/19/2015  . Stroke or transient ischemic attack (TIA) diagnosed during current admission 11/19/2015  . OSA on CPAP 05/02/2015  . Hemorrhagic cystitis 01/29/2015  . Sepsis ([place]) 01/29/2015  . Flank pain 01/29/2015  . Sinus tachycardia 01/29/2015  . Lymphedema 01/29/2015  . Morbid obesity with body mass  index of 50.0-59.9 in adult [facility]) 01/25/2015  . Obesity hypoventilation syndrome ([place]) 01/25/2015  . Snoring   . High cholesterol   . TIA (transient ischemic attack) 01/10/2015  . Morbid obesity ([place]) 01/10/2015  . Encephalopathy acute 01/09/2015  . HTN (hypertension) 01/09/2015  . Borderline diabetic 01/09/2015  . GERD (gastroesophageal reflux disease) 01/09/2015  . Acute encephalopathy     Andrey Spearman, MS, OTR/L, [facility] 03/11/17 3:34 PM  [place] MAIN [facility] SERVICES [address] [place], [place], [address] Phone: [phone]   Fax:  [phone]  Name: Ryan Burgess MRN: [id number] Date of Birth: Sep 19, 1971

## 2017-03-12 ENCOUNTER — Ambulatory Visit: Payer: BLUE CROSS/BLUE SHIELD | Admitting: Occupational Therapy

## 2017-03-12 DIAGNOSIS — I890 Lymphedema, not elsewhere classified: Secondary | ICD-10-CM

## 2017-03-12 NOTE — Therapy (Signed)
[place] MAIN [facility] SERVICES [address] [place], [place], [address] Phone: [phone]   Fax:  [phone]  Occupational Therapy Treatment Note & Progress Report  Patient Details  Name: Ryan Burgess MRN: [id number] Date of Birth: 24-May-1971 Referring Provider: Christin Fudge, MD  Encounter Date: 03/12/2017      OT End of Session - 03/12/17 1439    Visit Number 10   Number of Visits 36   Date for OT Re-Evaluation 05/14/17   OT Start Time 0105   OT Stop Time 0230   OT Time Calculation (min) 85 min   Activity Tolerance Patient tolerated treatment well;No increased pain;Other (comment)  limited by body habitus   Behavior During Therapy [facility] for tasks assessed/performed      Past Medical History:  Diagnosis Date  . Cellulitis   . Cirrhosis, nonalcoholic ([place])   . GERD (gastroesophageal reflux disease)   . Headache    "maybe monthly" (12/19/2016)  . Hepatic encephalopathy ([place]) ~ 2016 X 2   "first time they thought it was a TIA; dx'd hepatic encephalopathy the 2nd time it happened"  . High cholesterol   . Hypertension   . Kidney stones 01/2015   UTI  . Migraine    "none in years; used to have them frequently" (12/19/2016)  . Obesity   . OSA on CPAP   . Pre-diabetes   . Snoring   . Venous stasis     Past Surgical History:  Procedure Laterality Date  . ESOPHAGOGASTRODUODENOSCOPY (EGD) WITH PROPOFOL N/A 07/26/2015   Procedure: ESOPHAGOGASTRODUODENOSCOPY (EGD) WITH PROPOFOL;  Surgeon: Arta Silence, MD;  Location: [facility];  Service: Endoscopy;  Laterality: N/A;  . TEE WITHOUT CARDIOVERSION N/A 04/03/2015   Procedure: TRANSESOPHAGEAL ECHOCARDIOGRAM (TEE);  Surgeon: Adrian Prows, MD;  Location: [place];  Service: Cardiovascular;  Laterality: N/A;  . WISDOM TOOTH EXTRACTION  1990    There were no vitals filed for this visit.      Subjective Assessment - 03/12/17 1439    Subjective  Pt returns for OT visit 10 /  36 for  CDT to BLE w/ emphasis on LLE  initially, then RLE. Pt reports wraps stayed up a bit better overnight thn has been typical.   Pertinent History Recent episode of LE cellulitis and sepsis w/ hospitalization; hx TIA, DM, morbid obesity, OSA, depression; non-smoker   Limitations Difficulty walking, difficulty w/ functional mobility and transfers, decreased ability to perform basic and instrumental ADLs (LB dressing and bathing, fitting and donning shoes and socks, difficulty performing shoping and  all home management tasks requiring standing and walking) Difficulty performing productive and work activities, restricted social participation in the community   Repetition Increases Symptoms   Patient Stated Goals decrease leg swelling and keep it from getting worse so I can do more   Currently in Pain? No/denies  not rated   Pain Onset --  chronic                      OT Treatments/Exercises (OP) - 03/12/17 0001      ADLs   ADL Education Given Yes     Manual Therapy   Manual Therapy Edema management;Manual Lymphatic Drainage (MLD);Compression Bandaging   Edema Management LLE skin care w/ low pH Eucerin lotion   Manual Lymphatic Drainage (MLD) Manual lymph drainage (MLD) to LLE in supine utilizing functional inguinal lymph nodes and deep abdominal lymphatics as is customary for non-cancer related lower extremity LE, including  bilateral "short neck" sequence, J strokes to sub and supraclavicular LN, deep abdominal pathways, functional inguinal LN, lower extremity proximal to distal w/ emphasis on medial knee bottleneck and politeal LN. Performed fibrosis technique to B maleoli and distal  legs to address fatty fibrosis. Good tolerance.   Compression Bandaging LLE gradient compression wraps applied circumferentially in gradient configuration from foot to groin over cotton stockette layer than Rosidal foam. Toe w/ Transelast to digits 1-3, then  10 cm x 1 to foot and ankle, hen 12 cm x 7  over 0.4 x 10 cm and 15 cm Rosidol Soft foam from A to G.Isoband over top layer   From knee to groin. Custom fabricated dorsal foot and malleolus pads  To use at home between visits.                 OT Education - 03/12/17 1448    Education provided Yes   Education Details Continued skilled Pt/caregiver education  And LE ADL training throughout visit for lymphedema self care/ home program, including compression wrapping, compression garment and device wear/care, lymphatic pumping ther ex, simple self-MLD, and skin care. Discussed progress towards goals.   Person(s) Educated Patient   Methods Explanation;Demonstration;Handout   Comprehension Verbalized understanding;Need further instruction             OT Long Term Goals - 03/12/17 1449      OT LONG TERM GOAL #1   Title Pt independent w/ lymphedema precautions and prevention principals and strategies to limit LE progression and infection risk.   Baseline dependent   Time 1   Period Weeks   Status Achieved     OT LONG TERM GOAL #2   Title Lymphedema (LE) management/ self-care: Pt able to apply multi layered, gradient compression wraps w Max caregiver assistance using proper techniques within 2 weeks to achieve optimal limb volume reductions bilaterally.   Baseline dependent   Time 2   Period Weeks   Status Achieved     OT LONG TERM GOAL #3   Title Lymphedema (LE) management/ self-care:  Pt to achieve at least 10% limb volume reductions bilaterally during Intensive CDT to limit LE progression, to reduce pain, and to improve safe ambulation and functional mobility.   Baseline dependent   Time 12   Period Weeks   Status Partially Met     OT LONG TERM GOAL #4   Title Lymphedema (LE) management/ self-care:  Pt to tolerate daily compression wraps, garments and devices in keeping w/ prescribed wear regime within 1 week of issue date to progress and retain clinical and functional gains and to limit LE progression.   Baseline  dependent   Time 12   Period Weeks   Status Partially Met     OT LONG TERM GOAL #5   Title Lymphedema (LE) management/ self-care:  During Management Phase CDT Pt to sustain current limb volumes within 5%, and all other clinical gains achieved during OT treatment with needed level of caregiver assistance to limit LE progression, infection risk and further functional decline.   Baseline dependent   Time 6   Period Months   Status On-going               Plan - 03/12/17 1450    Clinical Impression Statement Thigh volume is dramatically decreased today since last visit yesterday. Smaller thigh circumference in turn allows         compression wraps to remain in place without slipping down  at top  edge and bunching at ythe knee, further hindering lymph flow     from leg below knee. Pt has met or partially met all goals. Review LONG TERM GOALS section for details.  Cont as per POC.   Rehab Potential Good   OT Frequency 3x / week   OT Duration 12 weeks   OT Treatment/Interventions Self-care/ADL training;DME and/or AE instruction;Manual lymph drainage;Patient/family education;Compression bandaging;Therapeutic exercises;Therapeutic activities;Therapeutic exercise;Energy conservation;Manual Therapy;Other (comment)  skin care   Consulted and Agree with Plan of Care Patient      Patient will benefit from skilled therapeutic intervention in order to improve the following deficits and impairments:  Abnormal gait, Decreased knowledge of use of DME, Decreased skin integrity, Increased edema, Impaired flexibility, Pain, Decreased mobility, Decreased scar mobility, Decreased endurance, Decreased range of motion, Decreased strength, Decreased balance, Decreased knowledge of precautions, Difficulty walking, Obesity  Visit Diagnosis: Lymphedema, not elsewhere classified    Problem List Patient Active Problem List   Diagnosis Date Noted  . Cellulitis of leg, left 01/16/2017  . Cellulitis of leg,  right 12/23/2016  . AKI (acute kidney injury) ([place]) 12/19/2016  . Hyponatremia 12/19/2016  . Pressure injury of skin 12/19/2016  . Encephalopathy, hepatic ([place]) 11/20/2015  . Hepatic cirrhosis ([place]) 11/20/2015  . Thrombocytopenia ([place])   . Disorientation 11/19/2015  . Stroke or transient ischemic attack (TIA) diagnosed during current admission 11/19/2015  . OSA on CPAP 05/02/2015  . Hemorrhagic cystitis 01/29/2015  . Sepsis ([place]) 01/29/2015  . Flank pain 01/29/2015  . Sinus tachycardia 01/29/2015  . Lymphedema 01/29/2015  . Morbid obesity with body mass index of 50.0-59.9 in adult [facility]) 01/25/2015  . Obesity hypoventilation syndrome ([place]) 01/25/2015  . Snoring   . High cholesterol   . TIA (transient ischemic attack) 01/10/2015  . Morbid obesity ([place]) 01/10/2015  . Encephalopathy acute 01/09/2015  . HTN (hypertension) 01/09/2015  . Borderline diabetic 01/09/2015  . GERD (gastroesophageal reflux disease) 01/09/2015  . Acute encephalopathy    Andrey Spearman, MS, OTR/L, [facility] 03/12/17 2:53 PM   [place] MAIN [facility] SERVICES [address] [place], [place], [address] Phone: [phone]   Fax:  [phone]  Name: Ryan Burgess MRN: [id number] Date of Birth: 1971/08/25

## 2017-03-12 NOTE — Patient Instructions (Signed)

## 2017-03-13 ENCOUNTER — Ambulatory Visit: Payer: BLUE CROSS/BLUE SHIELD | Admitting: Occupational Therapy

## 2017-03-13 ENCOUNTER — Ambulatory Visit
Admission: RE | Admit: 2017-03-13 | Discharge: 2017-03-13 | Disposition: A | Discharge status: Home / Self Care | Payer: BLUE CROSS/BLUE SHIELD | Source: Ambulatory Visit | Attending: Gastroenterology | Admitting: Gastroenterology

## 2017-03-13 DIAGNOSIS — R161 Splenomegaly, not elsewhere classified: Secondary | ICD-10-CM | POA: Diagnosis not present

## 2017-03-13 DIAGNOSIS — C220 Liver cell carcinoma: Secondary | ICD-10-CM | POA: Diagnosis not present

## 2017-03-13 DIAGNOSIS — I890 Lymphedema, not elsewhere classified: Secondary | ICD-10-CM | POA: Diagnosis not present

## 2017-03-13 DIAGNOSIS — K7469 Other cirrhosis of liver: Secondary | ICD-10-CM | POA: Diagnosis not present

## 2017-03-13 NOTE — Therapy (Signed)
[place] MAIN [facility] SERVICES [address] [place], [place], [address] Phone: [phone]   Fax:  [phone]  Occupational Therapy Treatment  Patient Details  Name: Ryan Burgess MRN: [id number] Date of Birth: Jun 25, 1971 Referring Provider: Christin Fudge, MD  Encounter Date: 03/13/2017      OT End of Session - 03/13/17 1049    Visit Number 11   Number of Visits 36   Date for OT Re-Evaluation 05/14/17   OT Start Time 0905   OT Stop Time 1012   OT Time Calculation (min) 67 min   Activity Tolerance Patient tolerated treatment well;No increased pain;Other (comment)  limited by body habitus   Behavior During Therapy [facility] for tasks assessed/performed      Past Medical History:  Diagnosis Date  . Cellulitis   . Cirrhosis, nonalcoholic ([place])   . GERD (gastroesophageal reflux disease)   . Headache    "maybe monthly" (12/19/2016)  . Hepatic encephalopathy ([place]) ~ 2016 X 2   "first time they thought it was a TIA; dx'd hepatic encephalopathy the 2nd time it happened"  . High cholesterol   . Hypertension   . Kidney stones 01/2015   UTI  . Migraine    "none in years; used to have them frequently" (12/19/2016)  . Obesity   . OSA on CPAP   . Pre-diabetes   . Snoring   . Venous stasis     Past Surgical History:  Procedure Laterality Date  . ESOPHAGOGASTRODUODENOSCOPY (EGD) WITH PROPOFOL N/A 07/26/2015   Procedure: ESOPHAGOGASTRODUODENOSCOPY (EGD) WITH PROPOFOL;  Surgeon: Arta Silence, MD;  [facility];  Service: Endoscopy;  Laterality: N/A;  . TEE WITHOUT CARDIOVERSION N/A 04/03/2015   Procedure: TRANSESOPHAGEAL ECHOCARDIOGRAM (TEE);  Surgeon: Adrian Prows, MD;  Location: [place];  Service: Cardiovascular;  Laterality: N/A;  . WISDOM TOOTH EXTRACTION  1990    There were no vitals filed for this visit.      Subjective Assessment - 03/13/17 1046    Subjective  Pt returns for OT visit 11 /  36 for CDT to BLE w/ emphasis on  LLE  initially, then RLE. Pt has no new complaints. "My leg is so light and it bends!"   Pertinent History Recent episode of LE cellulitis and sepsis w/ hospitalization; hx TIA, DM, morbid obesity, OSA, depression; non-smoker   Limitations Difficulty walking, difficulty w/ functional mobility and transfers, decreased ability to perform basic and instrumental ADLs (LB dressing and bathing, fitting and donning shoes and socks, difficulty performing shoping and  all home management tasks requiring standing and walking) Difficulty performing productive and work activities, restricted social participation in the community   Repetition Increases Symptoms   Patient Stated Goals decrease leg swelling and keep it from getting worse so I can do more   Currently in Pain? No/denies   Pain Onset --  chronic                      OT Treatments/Exercises (OP) - 03/13/17 0001      ADLs   ADL Education Given Yes     Manual Therapy   Manual Therapy Edema management;Manual Lymphatic Drainage (MLD);Compression Bandaging   Edema Management LLE skin care w/ low pH Eucerin lotion   Manual Lymphatic Drainage (MLD) Manual lymph drainage (MLD) to LLE in supine utilizing functional inguinal lymph nodes and deep abdominal lymphatics as is customary for non-cancer related lower extremity LE, including bilateral "short neck" sequence, J strokes to  sub and supraclavicular LN, deep abdominal pathways, functional inguinal LN, lower extremity proximal to distal w/ emphasis on medial knee bottleneck and politeal LN. Performed fibrosis technique to B maleoli and distal  legs to address fatty fibrosis. Good tolerance.   Compression Bandaging LLE gradient compression wraps applied circumferentially in gradient configuration from foot to groin over cotton stockette layer than Rosidal foam. Toe w/ Transelast to digits 1-3, then  10 cm x 1 to foot and ankle, hen 12 cm x 7 over 0.4 x 10 cm and 15 cm Rosidol Soft foam from A  to G.Isoband over top layer   From knee to groin. Custom fabricated dorsal foot and malleolus pads  To use at home between visits.                 OT Education - 03/13/17 1048    Education provided Yes   Education Details Continued skilled Pt/caregiver education  And LE ADL training throughout visit for lymphedema self care/ home program, including compression wrapping, compression garment and device wear/care, lymphatic pumping ther ex, simple self-MLD, and skin care. Discussed progress towards goals.   Person(s) Educated Patient   Methods Explanation;Demonstration   Comprehension Verbalized understanding;Need further instruction             OT Long Term Goals - 03/12/17 1449      OT LONG TERM GOAL #1   Title Pt independent w/ lymphedema precautions and prevention principals and strategies to limit LE progression and infection risk.   Baseline dependent   Time 1   Period Weeks   Status Achieved     OT LONG TERM GOAL #2   Title Lymphedema (LE) management/ self-care: Pt able to apply multi layered, gradient compression wraps w Max caregiver assistance using proper techniques within 2 weeks to achieve optimal limb volume reductions bilaterally.   Baseline dependent   Time 2   Period Weeks   Status Achieved     OT LONG TERM GOAL #3   Title Lymphedema (LE) management/ self-care:  Pt to achieve at least 10% limb volume reductions bilaterally during Intensive CDT to limit LE progression, to reduce pain, and to improve safe ambulation and functional mobility.   Baseline dependent   Time 12   Period Weeks   Status Partially Met     OT LONG TERM GOAL #4   Title Lymphedema (LE) management/ self-care:  Pt to tolerate daily compression wraps, garments and devices in keeping w/ prescribed wear regime within 1 week of issue date to progress and retain clinical and functional gains and to limit LE progression.   Baseline dependent   Time 12   Period Weeks   Status Partially Met      OT LONG TERM GOAL #5   Title Lymphedema (LE) management/ self-care:  During Management Phase CDT Pt to sustain current limb volumes within 5%, and all other clinical gains achieved during OT treatment with needed level of caregiver assistance to limit LE progression, infection risk and further functional decline.   Baseline dependent   Time 6   Period Months   Status On-going               Plan - 03/13/17 1049    Clinical Impression Statement Pt tolerated all manual modalities, including MLD, skin care and compression wrapping, without difficulties during OT visit today. LLE continues to decrease in volume and tissue density daily. Thickened areas on foot and ankle remains, but dry, flaking skin is much improved with  improved hydration. Large medial thigh lobule is decongested and redundant skin hangs losely when not contained w/ compression. Cont as per POC. Cont to support Pt and spaouse  in their home program efforts between sessions. Pt has made excellent progress thus far.   Rehab Potential Good   OT Frequency 3x / week   OT Duration 12 weeks   OT Treatment/Interventions Self-care/ADL training;DME and/or AE instruction;Manual lymph drainage;Patient/family education;Compression bandaging;Therapeutic exercises;Therapeutic activities;Therapeutic exercise;Energy conservation;Manual Therapy;Other (comment)  skin care   Consulted and Agree with Plan of Care Patient      Patient will benefit from skilled therapeutic intervention in order to improve the following deficits and impairments:  Abnormal gait, Decreased knowledge of use of DME, Decreased skin integrity, Increased edema, Impaired flexibility, Pain, Decreased mobility, Decreased scar mobility, Decreased endurance, Decreased range of motion, Decreased strength, Decreased balance, Decreased knowledge of precautions, Difficulty walking, Obesity  Visit Diagnosis: Lymphedema, not elsewhere classified    Problem List Patient  Active Problem List   Diagnosis Date Noted  . Cellulitis of leg, left 01/16/2017  . Cellulitis of leg, right 12/23/2016  . AKI (acute kidney injury) ([place]) 12/19/2016  . Hyponatremia 12/19/2016  . Pressure injury of skin 12/19/2016  . Encephalopathy, hepatic ([place]) 11/20/2015  . Hepatic cirrhosis ([place]) 11/20/2015  . Thrombocytopenia ([place])   . Disorientation 11/19/2015  . Stroke or transient ischemic attack (TIA) diagnosed during current admission 11/19/2015  . OSA on CPAP 05/02/2015  . Hemorrhagic cystitis 01/29/2015  . Sepsis ([place]) 01/29/2015  . Flank pain 01/29/2015  . Sinus tachycardia 01/29/2015  . Lymphedema 01/29/2015  . Morbid obesity with body mass index of 50.0-59.9 in adult [facility]) 01/25/2015  . Obesity hypoventilation syndrome ([place]) 01/25/2015  . Snoring   . High cholesterol   . TIA (transient ischemic attack) 01/10/2015  . Morbid obesity ([place]) 01/10/2015  . Encephalopathy acute 01/09/2015  . HTN (hypertension) 01/09/2015  . Borderline diabetic 01/09/2015  . GERD (gastroesophageal reflux disease) 01/09/2015  . Acute encephalopathy     Andrey Spearman, MS, OTR/L, [facility] 03/13/17 10:55 AM  [place] MAIN [facility] SERVICES [address] [place], [place], [address] Phone: [phone]   Fax:  [phone]  Name: DELMAR ARRIAGA MRN: [id number] Date of Birth: 20-Nov-1971

## 2017-03-14 NOTE — Therapy (Signed)
[place] MAIN [facility] SERVICES [address] [place], [place], [address] Phone: [phone]   Fax:  [phone]  Occupational Therapy Treatment  Patient Details  Name: Ryan Burgess MRN: [id number] Date of Birth: 1971-05-24 Referring Provider: Christin Fudge, MD  Encounter Date: 03/04/2017      OT End of Session - 03/13/17 1049    Visit Number 11   Number of Visits 36   Date for OT Re-Evaluation 05/14/17   OT Start Time 0905   OT Stop Time 1012   OT Time Calculation (min) 67 min   Activity Tolerance Patient tolerated treatment well;No increased pain;Other (comment)  limited by body habitus   Behavior During Therapy [facility] for tasks assessed/performed      Past Medical History:  Diagnosis Date  . Cellulitis   . Cirrhosis, nonalcoholic ([place])   . GERD (gastroesophageal reflux disease)   . Headache    "maybe monthly" (12/19/2016)  . Hepatic encephalopathy ([place]) ~ 2016 X 2   "first time they thought it was a TIA; dx'd hepatic encephalopathy the 2nd time it happened"  . High cholesterol   . Hypertension   . Kidney stones 01/2015   UTI  . Migraine    "none in years; used to have them frequently" (12/19/2016)  . Obesity   . OSA on CPAP   . Pre-diabetes   . Snoring   . Venous stasis     Past Surgical History:  Procedure Laterality Date  . ESOPHAGOGASTRODUODENOSCOPY (EGD) WITH PROPOFOL N/A 07/26/2015   Procedure: ESOPHAGOGASTRODUODENOSCOPY (EGD) WITH PROPOFOL;  Surgeon: Arta Silence, MD;  Location: [facility];  Service: Endoscopy;  Laterality: N/A;  . TEE WITHOUT CARDIOVERSION N/A 04/03/2015   Procedure: TRANSESOPHAGEAL ECHOCARDIOGRAM (TEE);  Surgeon: Adrian Prows, MD;  Location: [place];  Service: Cardiovascular;  Laterality: N/A;  . WISDOM TOOTH EXTRACTION  1990    There were no vitals filed for this visit.      Subjective Assessment - 03/13/17 1046    Subjective  Pt returns for OT visit 11 /  36 for CDT to BLE w/ emphasis on  LLE  initially, then RLE. Pt has no new complaints. "My leg is so light and it bends!"   Pertinent History Recent episode of LE cellulitis and sepsis w/ hospitalization; hx TIA, DM, morbid obesity, OSA, depression; non-smoker   Limitations Difficulty walking, difficulty w/ functional mobility and transfers, decreased ability to perform basic and instrumental ADLs (LB dressing and bathing, fitting and donning shoes and socks, difficulty performing shoping and  all home management tasks requiring standing and walking) Difficulty performing productive and work activities, restricted social participation in the community   Repetition Increases Symptoms   Patient Stated Goals decrease leg swelling and keep it from getting worse so I can do more   Currently in Pain? No/denies   Pain Onset --  chronic                              OT Education - 03/13/17 1048    Education provided Yes   Education Details Continued skilled Pt/caregiver education  And LE ADL training throughout visit for lymphedema self care/ home program, including compression wrapping, compression garment and device wear/care, lymphatic pumping ther ex, simple self-MLD, and skin care. Discussed progress towards goals.   Person(s) Educated Patient   Methods Explanation;Demonstration   Comprehension Verbalized understanding;Need further instruction  OT Long Term Goals - 03/12/17 1449      OT LONG TERM GOAL #1   Title Pt independent w/ lymphedema precautions and prevention principals and strategies to limit LE progression and infection risk.   Baseline dependent   Time 1   Period Weeks   Status Achieved     OT LONG TERM GOAL #2   Title Lymphedema (LE) management/ self-care: Pt able to apply multi layered, gradient compression wraps w Max caregiver assistance using proper techniques within 2 weeks to achieve optimal limb volume reductions bilaterally.   Baseline dependent   Time 2   Period  Weeks   Status Achieved     OT LONG TERM GOAL #3   Title Lymphedema (LE) management/ self-care:  Pt to achieve at least 10% limb volume reductions bilaterally during Intensive CDT to limit LE progression, to reduce pain, and to improve safe ambulation and functional mobility.   Baseline dependent   Time 12   Period Weeks   Status Partially Met     OT LONG TERM GOAL #4   Title Lymphedema (LE) management/ self-care:  Pt to tolerate daily compression wraps, garments and devices in keeping w/ prescribed wear regime within 1 week of issue date to progress and retain clinical and functional gains and to limit LE progression.   Baseline dependent   Time 12   Period Weeks   Status Partially Met     OT LONG TERM GOAL #5   Title Lymphedema (LE) management/ self-care:  During Management Phase CDT Pt to sustain current limb volumes within 5%, and all other clinical gains achieved during OT treatment with needed level of caregiver assistance to limit LE progression, infection risk and further functional decline.   Baseline dependent   Time 6   Period Months   Status On-going               Plan - 03/13/17 1049    Clinical Impression Statement Pt tolerated all manual modalities, including MLD, skin care and compression wrapping, without difficulties during OT visit today. LLE continues to decrease in volume and tissue density daily. Thickened areas on foot and ankle remains, but dry, flaking skin is much improved with improved hydration. Large medial thigh lobule is decongested and redundant skin hangs losely when not contained w/ compression. Cont as per POC. Cont to support Pt and spaouse  in their home program efforts between sessions. Pt has made excellent progress thus far.   Rehab Potential Good   OT Frequency 3x / week   OT Duration 12 weeks   OT Treatment/Interventions Self-care/ADL training;DME and/or AE instruction;Manual lymph drainage;Patient/family education;Compression  bandaging;Therapeutic exercises;Therapeutic activities;Therapeutic exercise;Energy conservation;Manual Therapy;Other (comment)  skin care   Consulted and Agree with Plan of Care Patient      Patient will benefit from skilled therapeutic intervention in order to improve the following deficits and impairments:     Visit Diagnosis: Lymphedema, not elsewhere classified    Problem List Patient Active Problem List   Diagnosis Date Noted  . Cellulitis of leg, left 01/16/2017  . Cellulitis of leg, right 12/23/2016  . AKI (acute kidney injury) ([place]) 12/19/2016  . Hyponatremia 12/19/2016  . Pressure injury of skin 12/19/2016  . Encephalopathy, hepatic ([place]) 11/20/2015  . Hepatic cirrhosis ([place]) 11/20/2015  . Thrombocytopenia ([place])   . Disorientation 11/19/2015  . Stroke or transient ischemic attack (TIA) diagnosed during current admission 11/19/2015  . OSA on CPAP 05/02/2015  . Hemorrhagic cystitis 01/29/2015  . Sepsis ([place])  01/29/2015  . Flank pain 01/29/2015  . Sinus tachycardia 01/29/2015  . Lymphedema 01/29/2015  . Morbid obesity with body mass index of 50.0-59.9 in adult [facility]) 01/25/2015  . Obesity hypoventilation syndrome ([place]) 01/25/2015  . Snoring   . High cholesterol   . TIA (transient ischemic attack) 01/10/2015  . Morbid obesity ([place]) 01/10/2015  . Encephalopathy acute 01/09/2015  . HTN (hypertension) 01/09/2015  . Borderline diabetic 01/09/2015  . GERD (gastroesophageal reflux disease) 01/09/2015  . Acute encephalopathy     Andrey Spearman, MS, OTR/L, [facility] 03/14/17 1:27 PM  [place] MAIN [facility] SERVICES [address] [place], [place], [address] Phone: [phone]   Fax:  [phone]  Name: ARVINE CLAYBURN MRN: [id number] Date of Birth: [birth year]

## 2017-03-17 ENCOUNTER — Ambulatory Visit: Payer: BLUE CROSS/BLUE SHIELD | Admitting: Occupational Therapy

## 2017-03-17 DIAGNOSIS — I890 Lymphedema, not elsewhere classified: Secondary | ICD-10-CM

## 2017-03-17 NOTE — Therapy (Signed)
[place] MAIN [facility] SERVICES [address] [place], [place], [address] Phone: [phone]   Fax:  [phone]  Occupational Therapy Treatment  Patient Details  Name: Ryan Burgess MRN: [id number] Date of Birth: 03/04/1971 Referring Provider: Christin Fudge, MD  Encounter Date: 03/17/2017      OT End of Session - 03/17/17 1546    Visit Number 12   Number of Visits 36   Date for OT Re-Evaluation 05/14/17   OT Start Time 0210   OT Stop Time 0320   OT Time Calculation (min) 70 min   Activity Tolerance Patient tolerated treatment well;No increased pain;Other (comment)  limited by body habitus   Behavior During Therapy [facility] for tasks assessed/performed      Past Medical History:  Diagnosis Date  . Cellulitis   . Cirrhosis, nonalcoholic ([place])   . GERD (gastroesophageal reflux disease)   . Headache    "maybe monthly" (12/19/2016)  . Hepatic encephalopathy ([place]) ~ 2016 X 2   "first time they thought it was a TIA; dx'd hepatic encephalopathy the 2nd time it happened"  . High cholesterol   . Hypertension   . Kidney stones 01/2015   UTI  . Migraine    "none in years; used to have them frequently" (12/19/2016)  . Obesity   . OSA on CPAP   . Pre-diabetes   . Snoring   . Venous stasis     Past Surgical History:  Procedure Laterality Date  . ESOPHAGOGASTRODUODENOSCOPY (EGD) WITH PROPOFOL N/A 07/26/2015   Procedure: ESOPHAGOGASTRODUODENOSCOPY (EGD) WITH PROPOFOL;  Surgeon: Arta Silence, MD;  [facility];  Service: Endoscopy;  Laterality: N/A;  . TEE WITHOUT CARDIOVERSION N/A 04/03/2015   Procedure: TRANSESOPHAGEAL ECHOCARDIOGRAM (TEE);  Surgeon: Adrian Prows, MD;  Location: [place];  Service: Cardiovascular;  Laterality: N/A;  . WISDOM TOOTH EXTRACTION  1990    There were no vitals filed for this visit.      Subjective Assessment - 03/17/17 1543    Subjective  Pt returns for OT visit 12 /  36 for CDT to BLE w/ emphasis on  LLE  initially, then RLE. Rehab aide assists w/ rolling compression wraps today to free up OT for more MLD time. Pt reports no adverse reactions to wrapping RLE to knee in conjunction w/  RLE  thigh length compression.   Pertinent History Recent episode of LE cellulitis and sepsis w/ hospitalization; hx TIA, DM, morbid obesity, OSA, depression; non-smoker   Limitations Difficulty walking, difficulty w/ functional mobility and transfers, decreased ability to perform basic and instrumental ADLs (LB dressing and bathing, fitting and donning shoes and socks, difficulty performing shoping and  all home management tasks requiring standing and walking) Difficulty performing productive and work activities, restricted social participation in the community   Repetition Increases Symptoms   Patient Stated Goals decrease leg swelling and keep it from getting worse so I can do more   Currently in Pain? No/denies   Pain Onset --  chronic                      OT Treatments/Exercises (OP) - 03/17/17 0001      ADLs   ADL Education Given Yes     Manual Therapy   Manual Therapy Edema management;Manual Lymphatic Drainage (MLD);Compression Bandaging   Edema Management LLE skin care w/ low pH Eucerin lotion   Manual Lymphatic Drainage (MLD) Manual lymph drainage (MLD) to LLE in supine utilizing functional inguinal lymph  nodes and deep abdominal lymphatics as is customary for non-cancer related lower extremity LE, including bilateral "short neck" sequence, J strokes to sub and supraclavicular LN, deep abdominal pathways, functional inguinal LN, lower extremity proximal to distal w/ emphasis on medial knee bottleneck and politeal LN. Performed fibrosis technique to B maleoli and distal  legs to address fatty fibrosis. Good tolerance.   Compression Bandaging LLE gradient compression wraps applied circumferentially in gradient configuration from foot to groin over cotton stockette layer than Rosidal foam.  Toe w/ Transelast to digits 1-3, then  10 cm x 1 to foot and ankle, hen 12 cm x 7 over 0.4 x 10 cm and 15 cm Rosidol Soft foam from A to G.Isoband over top layer   From knee to groin. Custom fabricated dorsal foot and malleolus pads  To use at home between visits.                 OT Education - 03/17/17 1550    Education provided Yes   Education Details Continued skilled Pt/caregiver education  And LE ADL training throughout visit for lymphedema self care/ home program, including compression wrapping, compression garment and device wear/care, lymphatic pumping ther ex, simple self-MLD, and skin care. Discussed progress towards goals.   Person(s) Educated Patient   Methods Explanation   Comprehension Verbalized understanding             OT Long Term Goals - 03/12/17 1449      OT LONG TERM GOAL #1   Title Pt independent w/ lymphedema precautions and prevention principals and strategies to limit LE progression and infection risk.   Baseline dependent   Time 1   Period Weeks   Status Achieved     OT LONG TERM GOAL #2   Title Lymphedema (LE) management/ self-care: Pt able to apply multi layered, gradient compression wraps w Max caregiver assistance using proper techniques within 2 weeks to achieve optimal limb volume reductions bilaterally.   Baseline dependent   Time 2   Period Weeks   Status Achieved     OT LONG TERM GOAL #3   Title Lymphedema (LE) management/ self-care:  Pt to achieve at least 10% limb volume reductions bilaterally during Intensive CDT to limit LE progression, to reduce pain, and to improve safe ambulation and functional mobility.   Baseline dependent   Time 12   Period Weeks   Status Partially Met     OT LONG TERM GOAL #4   Title Lymphedema (LE) management/ self-care:  Pt to tolerate daily compression wraps, garments and devices in keeping w/ prescribed wear regime within 1 week of issue date to progress and retain clinical and functional gains and to  limit LE progression.   Baseline dependent   Time 12   Period Weeks   Status Partially Met     OT LONG TERM GOAL #5   Title Lymphedema (LE) management/ self-care:  During Management Phase CDT Pt to sustain current limb volumes within 5%, and all other clinical gains achieved during OT treatment with needed level of caregiver assistance to limit LE progression, infection risk and further functional decline.   Baseline dependent   Time 6   Period Months   Status On-going               Plan - 03/17/17 1546    Clinical Impression Statement Pt tolerating LLE compression to knee while wrapping RLE to groin. Spoise continues to assist w/ consistent compression wrapping and skin care at  home. Level of compliance with simple self MLD at home between visist is questionable. Pt tolerated all aspects of CDT today and continues to demonstrate steady progress towards all OT goals.   Rehab Potential Good   OT Frequency 3x / week   OT Duration 12 weeks   OT Treatment/Interventions Self-care/ADL training;DME and/or AE instruction;Manual lymph drainage;Patient/family education;Compression bandaging;Therapeutic exercises;Therapeutic activities;Therapeutic exercise;Energy conservation;Manual Therapy;Other (comment)  skin care   Consulted and Agree with Plan of Care Patient      Patient will benefit from skilled therapeutic intervention in order to improve the following deficits and impairments:  Abnormal gait, Decreased knowledge of use of DME, Decreased skin integrity, Increased edema, Impaired flexibility, Pain, Decreased mobility, Decreased scar mobility, Decreased endurance, Decreased range of motion, Decreased strength, Decreased balance, Decreased knowledge of precautions, Difficulty walking, Obesity  Visit Diagnosis: Lymphedema, not elsewhere classified    Problem List Patient Active Problem List   Diagnosis Date Noted  . Cellulitis of leg, left 01/16/2017  . Cellulitis of leg, right  12/23/2016  . AKI (acute kidney injury) ([place]) 12/19/2016  . Hyponatremia 12/19/2016  . Pressure injury of skin 12/19/2016  . Encephalopathy, hepatic ([place]) 11/20/2015  . Hepatic cirrhosis ([place]) 11/20/2015  . Thrombocytopenia ([place])   . Disorientation 11/19/2015  . Stroke or transient ischemic attack (TIA) diagnosed during current admission 11/19/2015  . OSA on CPAP 05/02/2015  . Hemorrhagic cystitis 01/29/2015  . Sepsis ([place]) 01/29/2015  . Flank pain 01/29/2015  . Sinus tachycardia 01/29/2015  . Lymphedema 01/29/2015  . Morbid obesity with body mass index of 50.0-59.9 in adult [facility]) 01/25/2015  . Obesity hypoventilation syndrome ([place]) 01/25/2015  . Snoring   . High cholesterol   . TIA (transient ischemic attack) 01/10/2015  . Morbid obesity ([place]) 01/10/2015  . Encephalopathy acute 01/09/2015  . HTN (hypertension) 01/09/2015  . Borderline diabetic 01/09/2015  . GERD (gastroesophageal reflux disease) 01/09/2015  . Acute encephalopathy      Andrey Spearman, MS, OTR/L, [facility] 03/17/17 3:52 PM   [place] MAIN [facility] SERVICES [address] [place], [place], [address] Phone: [phone]   Fax:  [phone]  Name: Ryan Burgess MRN: [id number] Date of Birth: 1971-08-29

## 2017-03-19 ENCOUNTER — Ambulatory Visit: Payer: BLUE CROSS/BLUE SHIELD | Admitting: Occupational Therapy

## 2017-03-19 DIAGNOSIS — I890 Lymphedema, not elsewhere classified: Secondary | ICD-10-CM

## 2017-03-19 NOTE — Therapy (Signed)
[place] MAIN [facility] SERVICES [address] [place], [place], [address] Phone: [phone]   Fax:  [phone]  Occupational Therapy Treatment  Patient Details  Name: Ryan Burgess MRN: [id number] Date of Birth: [birth year] Referring Provider: Christin Fudge, MD  Encounter Date: 03/19/2017      OT End of Session - 03/19/17 1635    Visit Number 13   Number of Visits 36   Date for OT Re-Evaluation 05/14/17   OT Start Time 0121   OT Stop Time 0212   OT Time Calculation (min) 51 min   Activity Tolerance Patient tolerated treatment well;No increased pain;Other (comment)  limited by body habitus   Behavior During Therapy [facility] for tasks assessed/performed      Past Medical History:  Diagnosis Date  . Cellulitis   . Cirrhosis, nonalcoholic ([place])   . GERD (gastroesophageal reflux disease)   . Headache    "maybe monthly" (12/19/2016)  . Hepatic encephalopathy ([place]) ~ 2016 X 2   "first time they thought it was a TIA; dx'd hepatic encephalopathy the 2nd time it happened"  . High cholesterol   . Hypertension   . Kidney stones 01/2015   UTI  . Migraine    "none in years; used to have them frequently" (12/19/2016)  . Obesity   . OSA on CPAP   . Pre-diabetes   . Snoring   . Venous stasis     Past Surgical History:  Procedure Laterality Date  . ESOPHAGOGASTRODUODENOSCOPY (EGD) WITH PROPOFOL N/A 07/26/2015   Procedure: ESOPHAGOGASTRODUODENOSCOPY (EGD) WITH PROPOFOL;  Surgeon: Arta Silence, MD;  [facility];  Service: Endoscopy;  Laterality: N/A;  . TEE WITHOUT CARDIOVERSION N/A 04/03/2015   Procedure: TRANSESOPHAGEAL ECHOCARDIOGRAM (TEE);  Surgeon: Adrian Prows, MD;  Location: [place];  Service: Cardiovascular;  Laterality: N/A;  . WISDOM TOOTH EXTRACTION  1990    There were no vitals filed for this visit.      Subjective Assessment - 03/19/17 1632    Subjective  Pt returns for OT visit 13 /  36 for CDT to BLE w/ emphasis on  LLE  initially, then RLE. Pt 20 minutes late for 60 minute session. Rehab aide assists w/ rolling compression wraps today. Pt states that he is pleased with progress thus far.    Pertinent History Recent episode of LE cellulitis and sepsis w/ hospitalization; hx TIA, DM, morbid obesity, OSA, depression; non-smoker   Limitations Difficulty walking, difficulty w/ functional mobility and transfers, decreased ability to perform basic and instrumental ADLs (LB dressing and bathing, fitting and donning shoes and socks, difficulty performing shoping and  all home management tasks requiring standing and walking) Difficulty performing productive and work activities, restricted social participation in the community   Repetition Increases Symptoms   Patient Stated Goals decrease leg swelling and keep it from getting worse so I can do more   Currently in Pain? No/denies   Pain Onset --  chronic                              OT Education - 03/19/17 1634    Education provided Yes   Education Details Continued skilled Pt/caregiver education  And LE ADL training throughout visit for lymphedema self care/ home program, including compression wrapping, compression garment and device wear/care, lymphatic pumping ther ex, simple self-MLD, and skin care. Discussed progress towards goals.   Person(s) Educated Patient   Methods Explanation  Comprehension Other (comment);Verbalized understanding             OT Long Term Goals - 03/12/17 1449      OT LONG TERM GOAL #1   Title Pt independent w/ lymphedema precautions and prevention principals and strategies to limit LE progression and infection risk.   Baseline dependent   Time 1   Period Weeks   Status Achieved     OT LONG TERM GOAL #2   Title Lymphedema (LE) management/ self-care: Pt able to apply multi layered, gradient compression wraps w Max caregiver assistance using proper techniques within 2 weeks to achieve optimal limb volume  reductions bilaterally.   Baseline dependent   Time 2   Period Weeks   Status Achieved     OT LONG TERM GOAL #3   Title Lymphedema (LE) management/ self-care:  Pt to achieve at least 10% limb volume reductions bilaterally during Intensive CDT to limit LE progression, to reduce pain, and to improve safe ambulation and functional mobility.   Baseline dependent   Time 12   Period Weeks   Status Partially Met     OT LONG TERM GOAL #4   Title Lymphedema (LE) management/ self-care:  Pt to tolerate daily compression wraps, garments and devices in keeping w/ prescribed wear regime within 1 week of issue date to progress and retain clinical and functional gains and to limit LE progression.   Baseline dependent   Time 12   Period Weeks   Status Partially Met     OT LONG TERM GOAL #5   Title Lymphedema (LE) management/ self-care:  During Management Phase CDT Pt to sustain current limb volumes within 5%, and all other clinical gains achieved during OT treatment with needed level of caregiver assistance to limit LE progression, infection risk and further functional decline.   Baseline dependent   Time 6   Period Months   Status On-going               Plan - 03/19/17 1635    Clinical Impression Statement Pt in agreement with requesting DME rep from [employer]wr assist [place] w/ LLE garment measurements. Pt performed "short neck" MLD sequence today with min assist, including modeling and verbal cues. Pt tolerated MLD, which was abbreviated 2/2 time constraints. Courtney Heys applied as established. Cont as per POC. Consider garment measurements end of next week.   Rehab Potential Good   OT Frequency 3x / week   OT Duration 12 weeks   OT Treatment/Interventions Self-care/ADL training;DME and/or AE instruction;Manual lymph drainage;Patient/family education;Compression bandaging;Therapeutic exercises;Therapeutic activities;Therapeutic exercise;Energy conservation;Manual Therapy;Other (comment)  skin  care   Consulted and Agree with Plan of Care Patient      Patient will benefit from skilled therapeutic intervention in order to improve the following deficits and impairments:  Abnormal gait, Decreased knowledge of use of DME, Decreased skin integrity, Increased edema, Impaired flexibility, Pain, Decreased mobility, Decreased scar mobility, Decreased endurance, Decreased range of motion, Decreased strength, Decreased balance, Decreased knowledge of precautions, Difficulty walking, Obesity  Visit Diagnosis: Lymphedema, not elsewhere classified    Problem List Patient Active Problem List   Diagnosis Date Noted  . Cellulitis of leg, left 01/16/2017  . Cellulitis of leg, right 12/23/2016  . AKI (acute kidney injury) ([place]) 12/19/2016  . Hyponatremia 12/19/2016  . Pressure injury of skin 12/19/2016  . Encephalopathy, hepatic ([place]) 11/20/2015  . Hepatic cirrhosis ([place]) 11/20/2015  . Thrombocytopenia ([place])   . Disorientation 11/19/2015  . Stroke or transient ischemic attack (TIA)  diagnosed during current admission 11/19/2015  . OSA on CPAP 05/02/2015  . Hemorrhagic cystitis 01/29/2015  . Sepsis ([place]) 01/29/2015  . Flank pain 01/29/2015  . Sinus tachycardia 01/29/2015  . Lymphedema 01/29/2015  . Morbid obesity with body mass index of 50.0-59.9 in adult [facility]) 01/25/2015  . Obesity hypoventilation syndrome ([place]) 01/25/2015  . Snoring   . High cholesterol   . TIA (transient ischemic attack) 01/10/2015  . Morbid obesity ([place]) 01/10/2015  . Encephalopathy acute 01/09/2015  . HTN (hypertension) 01/09/2015  . Borderline diabetic 01/09/2015  . GERD (gastroesophageal reflux disease) 01/09/2015  . Acute encephalopathy     Andrey Spearman, MS, OTR/L, [facility] 03/19/17 4:41 PM  [place] MAIN [facility] SERVICES [address] [place], [place], [address] Phone: [phone]   Fax:  [phone]  Name: NOLAND PIZANO MRN: [id number] Date of Birth:  [birth year]

## 2017-03-20 ENCOUNTER — Ambulatory Visit: Payer: BLUE CROSS/BLUE SHIELD | Admitting: Occupational Therapy

## 2017-03-20 DIAGNOSIS — I890 Lymphedema, not elsewhere classified: Secondary | ICD-10-CM | POA: Diagnosis not present

## 2017-03-20 NOTE — Therapy (Signed)
[place] MAIN [facility] SERVICES [address] [place], [place], [address] Phone: [phone]   Fax:  [phone]  Occupational Therapy Treatment  Patient Details  Name: Ryan Burgess MRN: [id number] Date of Birth: Jan 17, 1971 Referring Provider: Christin Fudge, MD  Encounter Date: 03/20/2017      OT End of Session - 03/20/17 1107    Visit Number 14   Number of Visits 36   Date for OT Re-Evaluation 05/14/17   OT Start Time 0911   OT Stop Time 1030   OT Time Calculation (min) 79 min   Activity Tolerance Patient tolerated treatment well;No increased pain;Other (comment)  limited by body habitus   Behavior During Therapy [facility] for tasks assessed/performed      Past Medical History:  Diagnosis Date  . Cellulitis   . Cirrhosis, nonalcoholic ([place])   . GERD (gastroesophageal reflux disease)   . Headache    "maybe monthly" (12/19/2016)  . Hepatic encephalopathy ([place]) ~ 2016 X 2   "first time they thought it was a TIA; dx'd hepatic encephalopathy the 2nd time it happened"  . High cholesterol   . Hypertension   . Kidney stones 01/2015   UTI  . Migraine    "none in years; used to have them frequently" (12/19/2016)  . Obesity   . OSA on CPAP   . Pre-diabetes   . Snoring   . Venous stasis     Past Surgical History:  Procedure Laterality Date  . ESOPHAGOGASTRODUODENOSCOPY (EGD) WITH PROPOFOL N/A 07/26/2015   Procedure: ESOPHAGOGASTRODUODENOSCOPY (EGD) WITH PROPOFOL;  Surgeon: Arta Silence, MD;  [facility];  Service: Endoscopy;  Laterality: N/A;  . TEE WITHOUT CARDIOVERSION N/A 04/03/2015   Procedure: TRANSESOPHAGEAL ECHOCARDIOGRAM (TEE);  Surgeon: Adrian Prows, MD;  Location: [place];  Service: Cardiovascular;  Laterality: N/A;  . WISDOM TOOTH EXTRACTION  1990    There were no vitals filed for this visit.      Subjective Assessment - 03/20/17 1101    Subjective  Pt returns for OT visit 14/  36 for CDT to BLE w/ emphasis on  LLE  initially, then RLE. Pt on time for session. Pt requests second set of wraps now so he can wash and wear current set.    Pertinent History Recent episode of LE cellulitis and sepsis w/ hospitalization; hx TIA, DM, morbid obesity, OSA, depression; non-smoker   Limitations Difficulty walking, difficulty w/ functional mobility and transfers, decreased ability to perform basic and instrumental ADLs (LB dressing and bathing, fitting and donning shoes and socks, difficulty performing shoping and  all home management tasks requiring standing and walking) Difficulty performing productive and work activities, restricted social participation in the community   Repetition Increases Symptoms   Patient Stated Goals decrease leg swelling and keep it from getting worse so I can do more   Currently in Pain? No/denies   Pain Onset --  chronic             LYMPHEDEMA/ONCOLOGY QUESTIONNAIRE - 03/20/17 1104      Right Lower Extremity Lymphedema   Other RLE A-D=  8054.81 ml; RLE A-G= 17848.91 ml   Other RLE AD volume is decreased by 29.24% since initial measurments on 02/20/17. RLE A-G volume is decreased by 28.13 % since commencing CDT                 OT Treatments/Exercises (OP) - 03/20/17 0001      ADLs   ADL Education Given Yes  Manual Therapy   Manual Therapy Edema management;Compression Bandaging   Manual therapy comments RLE comparative limb volumetrics completed today   Compression Bandaging LLE gradient compression wraps applied circumferentially in gradient configuration from foot to groin over cotton stockette layer than Rosidal foam. Toe w/ Transelast to digits 1-3, then  10 cm x 1 to foot and ankle, hen 12 cm x 7 over 0.4 x 10 cm and 15 cm Rosidol Soft foam from A to G.Isoband over top layer   From knee to groin. Custom fabricated dorsal foot and malleolus pads  To use at home between visits.                 OT Education - 03/20/17 1104    Education provided Yes    Person(s) Educated Patient   Methods Explanation;Demonstration   Comprehension Verbalized understanding;Need further instruction             OT Long Term Goals - 03/12/17 1449      OT LONG TERM GOAL #1   Title Pt independent w/ lymphedema precautions and prevention principals and strategies to limit LE progression and infection risk.   Baseline dependent   Time 1   Period Weeks   Status Achieved     OT LONG TERM GOAL #2   Title Lymphedema (LE) management/ self-care: Pt able to apply multi layered, gradient compression wraps w Max caregiver assistance using proper techniques within 2 weeks to achieve optimal limb volume reductions bilaterally.   Baseline dependent   Time 2   Period Weeks   Status Achieved     OT LONG TERM GOAL #3   Title Lymphedema (LE) management/ self-care:  Pt to achieve at least 10% limb volume reductions bilaterally during Intensive CDT to limit LE progression, to reduce pain, and to improve safe ambulation and functional mobility.   Baseline dependent   Time 12   Period Weeks   Status Partially Met     OT LONG TERM GOAL #4   Title Lymphedema (LE) management/ self-care:  Pt to tolerate daily compression wraps, garments and devices in keeping w/ prescribed wear regime within 1 week of issue date to progress and retain clinical and functional gains and to limit LE progression.   Baseline dependent   Time 12   Period Weeks   Status Partially Met     OT LONG TERM GOAL #5   Title Lymphedema (LE) management/ self-care:  During Management Phase CDT Pt to sustain current limb volumes within 5%, and all other clinical gains achieved during OT treatment with needed level of caregiver assistance to limit LE progression, infection risk and further functional decline.   Baseline dependent   Time 6   Period Months   Status On-going               Plan - 03/20/17 1108    Clinical Impression Statement RLE comparative limb volumetrics reveal RLE AD volume  is decreased by 29.24% since initial measurments on 02/20/17. RLE A-G volume is decreased by 28.13 % since commencing CDT. These values meet and exceed the initial 10% limb volume reduction goals. Pt tolerated all aspectsof care today. Cont as per POC.   Rehab Potential Good   OT Frequency 3x / week   OT Duration 12 weeks   OT Treatment/Interventions Self-care/ADL training;DME and/or AE instruction;Manual lymph drainage;Patient/family education;Compression bandaging;Therapeutic exercises;Therapeutic activities;Therapeutic exercise;Energy conservation;Manual Therapy;Other (comment)  skin care   Consulted and Agree with Plan of Care Patient  Patient will benefit from skilled therapeutic intervention in order to improve the following deficits and impairments:  Abnormal gait, Decreased knowledge of use of DME, Decreased skin integrity, Increased edema, Impaired flexibility, Pain, Decreased mobility, Decreased scar mobility, Decreased endurance, Decreased range of motion, Decreased strength, Decreased balance, Decreased knowledge of precautions, Difficulty walking, Obesity  Visit Diagnosis: Lymphedema, not elsewhere classified    Problem List Patient Active Problem List   Diagnosis Date Noted  . Cellulitis of leg, left 01/16/2017  . Cellulitis of leg, right 12/23/2016  . AKI (acute kidney injury) ([place]) 12/19/2016  . Hyponatremia 12/19/2016  . Pressure injury of skin 12/19/2016  . Encephalopathy, hepatic ([place]) 11/20/2015  . Hepatic cirrhosis ([place]) 11/20/2015  . Thrombocytopenia ([place])   . Disorientation 11/19/2015  . Stroke or transient ischemic attack (TIA) diagnosed during current admission 11/19/2015  . OSA on CPAP 05/02/2015  . Hemorrhagic cystitis 01/29/2015  . Sepsis ([place]) 01/29/2015  . Flank pain 01/29/2015  . Sinus tachycardia 01/29/2015  . Lymphedema 01/29/2015  . Morbid obesity with body mass index of 50.0-59.9 in adult [place]) 01/25/2015  . Obesity hypoventilation  syndrome ([place]) 01/25/2015  . Snoring   . High cholesterol   . TIA (transient ischemic attack) 01/10/2015  . Morbid obesity ([place]) 01/10/2015  . Encephalopathy acute 01/09/2015  . HTN (hypertension) 01/09/2015  . Borderline diabetic 01/09/2015  . GERD (gastroesophageal reflux disease) 01/09/2015  . Acute encephalopathy     Andrey Spearman, MS, OTR/L, [facility] 03/20/17 11:10 AM  [place] MAIN [facility] SERVICES [address] [place], [place], [address] Phone: [phone]   Fax:  [phone]  Name: Ryan Burgess MRN: [id number] Date of Birth: 02-25-1971

## 2017-03-24 ENCOUNTER — Ambulatory Visit: Payer: BLUE CROSS/BLUE SHIELD | Admitting: Occupational Therapy

## 2017-03-24 DIAGNOSIS — I890 Lymphedema, not elsewhere classified: Secondary | ICD-10-CM | POA: Diagnosis not present

## 2017-03-25 NOTE — Therapy (Signed)
[place] MAIN [facility] SERVICES [address] [place], [place], [address] Phone: [phone]   Fax:  [phone]  Occupational Therapy Treatment  Patient Details  Name: Ryan Burgess MRN: [id number] Date of Birth: July 05, 1971 Referring Provider: Christin Fudge, MD  Encounter Date: 03/24/2017      OT End of Session - 03/24/17 1048    Visit Number 15   Number of Visits 36   Date for OT Re-Evaluation 05/14/17   OT Start Time 0205   OT Stop Time 0315   OT Time Calculation (min) 70 min   Activity Tolerance Patient tolerated treatment well;No increased pain;Other (comment)  limited by body habitus   Behavior During Therapy [facility] for tasks assessed/performed      Past Medical History:  Diagnosis Date  . Cellulitis   . Cirrhosis, nonalcoholic ([place])   . GERD (gastroesophageal reflux disease)   . Headache    "maybe monthly" (12/19/2016)  . Hepatic encephalopathy ([place]) ~ 2016 X 2   "first time they thought it was a TIA; dx'd hepatic encephalopathy the 2nd time it happened"  . High cholesterol   . Hypertension   . Kidney stones 01/2015   UTI  . Migraine    "none in years; used to have them frequently" (12/19/2016)  . Obesity   . OSA on CPAP   . Pre-diabetes   . Snoring   . Venous stasis     Past Surgical History:  Procedure Laterality Date  . ESOPHAGOGASTRODUODENOSCOPY (EGD) WITH PROPOFOL N/A 07/26/2015   Procedure: ESOPHAGOGASTRODUODENOSCOPY (EGD) WITH PROPOFOL;  Surgeon: Arta Silence, MD;  Location: [facility];  Service: Endoscopy;  Laterality: N/A;  . TEE WITHOUT CARDIOVERSION N/A 04/03/2015   Procedure: TRANSESOPHAGEAL ECHOCARDIOGRAM (TEE);  Surgeon: Adrian Prows, MD;  Location: [place];  Service: Cardiovascular;  Laterality: N/A;  . WISDOM TOOTH EXTRACTION  1990    There were no vitals filed for this visit.      Subjective Assessment - 03/24/17 1049    Subjective  Pt returns for OT visit 15/  36 for CDT to BLE w/ emphasis on  LLE  initially, then RLE. Pt brings full set of rolled wraps to apply after treatnment, which will save [place] quite a bit of time today. Pt has no new complaints.   Pertinent History Recent episode of LE cellulitis and sepsis w/ hospitalization; hx TIA, DM, morbid obesity, OSA, depression; non-smoker   Limitations Difficulty walking, difficulty w/ functional mobility and transfers, decreased ability to perform basic and instrumental ADLs (LB dressing and bathing, fitting and donning shoes and socks, difficulty performing shoping and  all home management tasks requiring standing and walking) Difficulty performing productive and work activities, restricted social participation in the community   Repetition Increases Symptoms   Patient Stated Goals decrease leg swelling and keep it from getting worse so I can do more   Currently in Pain? No/denies   Pain Onset --  chronic                      OT Treatments/Exercises (OP) - 03/25/17 0001      ADLs   ADL Education Given Yes     Manual Therapy   Manual Therapy Edema management;Compression Bandaging   Edema Management LLE skin care w/ low pH Eucerin lotion   Compression Bandaging LLE gradient compression wraps applied circumferentially in gradient configuration from foot to groin over cotton stockette layer than Rosidal foam. Toe w/ Transelast to digits 1-3, then  10 cm x 1 to foot and ankle, hen 12 cm x 7 over 0.4 x 10 cm and 15 cm Rosidol Soft foam from A to G.Isoband over top layer   From knee to groin. Custom fabricated dorsal foot and malleolus pads  To use at home between visits.                 OT Education - 03/24/17 1052    Education provided Yes   Education Details Emphasis of Pt edu for LE self care today on simple self MLD for non-cancer related, BLE lymphedema.    Person(s) Educated Patient   Methods Explanation;Demonstration;Tactile cues;Verbal cues;Handout   Comprehension Verbalized understanding;Returned  demonstration;Verbal cues required;Tactile cues required;Need further instruction             OT Long Term Goals - 03/12/17 1449      OT LONG TERM GOAL #1   Title Pt independent w/ lymphedema precautions and prevention principals and strategies to limit LE progression and infection risk.   Baseline dependent   Time 1   Period Weeks   Status Achieved     OT LONG TERM GOAL #2   Title Lymphedema (LE) management/ self-care: Pt able to apply multi layered, gradient compression wraps w Max caregiver assistance using proper techniques within 2 weeks to achieve optimal limb volume reductions bilaterally.   Baseline dependent   Time 2   Period Weeks   Status Achieved     OT LONG TERM GOAL #3   Title Lymphedema (LE) management/ self-care:  Pt to achieve at least 10% limb volume reductions bilaterally during Intensive CDT to limit LE progression, to reduce pain, and to improve safe ambulation and functional mobility.   Baseline dependent   Time 12   Period Weeks   Status Partially Met     OT LONG TERM GOAL #4   Title Lymphedema (LE) management/ self-care:  Pt to tolerate daily compression wraps, garments and devices in keeping w/ prescribed wear regime within 1 week of issue date to progress and retain clinical and functional gains and to limit LE progression.   Baseline dependent   Time 12   Period Weeks   Status Partially Met     OT LONG TERM GOAL #5   Title Lymphedema (LE) management/ self-care:  During Management Phase CDT Pt to sustain current limb volumes within 5%, and all other clinical gains achieved during OT treatment with needed level of caregiver assistance to limit LE progression, infection risk and further functional decline.   Baseline dependent   Time 6   Period Months   Status On-going               Plan - 03/24/17 1053    Clinical Impression Statement Pt tolerating compression wraps without difficulty. No sign of skin irritation or bandage fatigue. Pt  instructed to   use care when wrapping around knee to limit maceration with redundant skin against medial leg. By end of session Pt verablized understanding of MLD techniques and rational for proper sequence. Cont as per POC.   Rehab Potential Good   OT Frequency 3x / week   OT Duration 12 weeks   OT Treatment/Interventions Self-care/ADL training;DME and/or AE instruction;Manual lymph drainage;Patient/family education;Compression bandaging;Therapeutic exercises;Therapeutic activities;Therapeutic exercise;Energy conservation;Manual Therapy;Other (comment)  skin care   Consulted and Agree with Plan of Care Patient      Patient will benefit from skilled therapeutic intervention in order to improve the following deficits and impairments:  Abnormal gait,  Decreased knowledge of use of DME, Decreased skin integrity, Increased edema, Impaired flexibility, Pain, Decreased mobility, Decreased scar mobility, Decreased endurance, Decreased range of motion, Decreased strength, Decreased balance, Decreased knowledge of precautions, Difficulty walking, Obesity  Visit Diagnosis: Lymphedema, not elsewhere classified    Problem List Patient Active Problem List   Diagnosis Date Noted  . Cellulitis of leg, left 01/16/2017  . Cellulitis of leg, right 12/23/2016  . AKI (acute kidney injury) ([place]) 12/19/2016  . Hyponatremia 12/19/2016  . Pressure injury of skin 12/19/2016  . Encephalopathy, hepatic ([place]) 11/20/2015  . Hepatic cirrhosis ([place]) 11/20/2015  . Thrombocytopenia ([place])   . Disorientation 11/19/2015  . Stroke or transient ischemic attack (TIA) diagnosed during current admission 11/19/2015  . OSA on CPAP 05/02/2015  . Hemorrhagic cystitis 01/29/2015  . Sepsis ([place]) 01/29/2015  . Flank pain 01/29/2015  . Sinus tachycardia 01/29/2015  . Lymphedema 01/29/2015  . Morbid obesity with body mass index of 50.0-59.9 in adult [facility]) 01/25/2015  . Obesity hypoventilation syndrome ([place]) 01/25/2015  .  Snoring   . High cholesterol   . TIA (transient ischemic attack) 01/10/2015  . Morbid obesity ([place]) 01/10/2015  . Encephalopathy acute 01/09/2015  . HTN (hypertension) 01/09/2015  . Borderline diabetic 01/09/2015  . GERD (gastroesophageal reflux disease) 01/09/2015  . Acute encephalopathy     Andrey Spearman, MS, OTR/L, [facility] 03/25/17 10:58 AM   [place] MAIN [facility] SERVICES [address] [place], [place], [address] Phone: [phone]   Fax:  [phone]  Name: Ryan Burgess MRN: [id number] Date of Birth: 10/31/1971

## 2017-03-26 ENCOUNTER — Ambulatory Visit: Payer: BLUE CROSS/BLUE SHIELD | Admitting: Occupational Therapy

## 2017-03-26 DIAGNOSIS — I890 Lymphedema, not elsewhere classified: Secondary | ICD-10-CM | POA: Diagnosis not present

## 2017-03-26 NOTE — Therapy (Signed)
[place] MAIN [facility] SERVICES [address] [place], [place], [address] Phone: [phone]   Fax:  [phone]  Occupational Therapy Treatment  Patient Details  Name: Ryan Burgess MRN: [id number] Date of Birth: 18-Nov-1971 Referring Provider: Christin Fudge, MD  Encounter Date: 03/26/2017      OT End of Session - 03/26/17 1733    Visit Number 16   Number of Visits 36   Date for OT Re-Evaluation 05/14/17   OT Start Time 0105   OT Stop Time 0215   OT Time Calculation (min) 70 min   Activity Tolerance Patient tolerated treatment well;No increased pain;Other (comment)  limited by body habitus   Behavior During Therapy [facility] for tasks assessed/performed      Past Medical History:  Diagnosis Date  . Cellulitis   . Cirrhosis, nonalcoholic ([place])   . GERD (gastroesophageal reflux disease)   . Headache    "maybe monthly" (12/19/2016)  . Hepatic encephalopathy ([place]) ~ 2016 X 2   "first time they thought it was a TIA; dx'd hepatic encephalopathy the 2nd time it happened"  . High cholesterol   . Hypertension   . Kidney stones 01/2015   UTI  . Migraine    "none in years; used to have them frequently" (12/19/2016)  . Obesity   . OSA on CPAP   . Pre-diabetes   . Snoring   . Venous stasis     Past Surgical History:  Procedure Laterality Date  . ESOPHAGOGASTRODUODENOSCOPY (EGD) WITH PROPOFOL N/A 07/26/2015   Procedure: ESOPHAGOGASTRODUODENOSCOPY (EGD) WITH PROPOFOL;  Surgeon: Arta Silence, MD;  Location: [facility];  Service: Endoscopy;  Laterality: N/A;  . TEE WITHOUT CARDIOVERSION N/A 04/03/2015   Procedure: TRANSESOPHAGEAL ECHOCARDIOGRAM (TEE);  Surgeon: Adrian Prows, MD;  Location: [place];  Service: Cardiovascular;  Laterality: N/A;  . WISDOM TOOTH EXTRACTION  1990    There were no vitals filed for this visit.      Subjective Assessment - 03/26/17 1730    Subjective  Pt returns for OT visit 16/  36 for CDT to BLE w/ emphasis on  LLE  initially, then RLE. DME vendor for [employer] here to assist Pt with trial of Flexitouch sequential pneumatic compression device.    Pertinent History Recent episode of LE cellulitis and sepsis w/ hospitalization; hx TIA, DM, morbid obesity, OSA, depression; non-smoker   Limitations Difficulty walking, difficulty w/ functional mobility and transfers, decreased ability to perform basic and instrumental ADLs (LB dressing and bathing, fitting and donning shoes and socks, difficulty performing shoping and  all home management tasks requiring standing and walking) Difficulty performing productive and work activities, restricted social participation in the community   Repetition Increases Symptoms   Patient Stated Goals decrease leg swelling and keep it from getting worse so I can do more   Currently in Pain? No/denies   Pain Onset --  chronic                      OT Treatments/Exercises (OP) - 03/26/17 0001      ADLs   ADL Education Given Yes     Manual Therapy   Manual Therapy Edema management;Manual Lymphatic Drainage (MLD);Compression Bandaging;Other (comment)  Sequential Pneumatic Compression Pump trial- [employer]   Manual Lymphatic Drainage (MLD) NO MLD today 2/2    Compression Bandaging no clinical wraps  today 2/2 pump trial. Pt to apply wraps at home w/ spouse's assistance  OT Education - 03/26/17 1731    Education provided Yes   Education Details Emphasis of Pt education/ for LE self-care training on clinical rational for, and care and use of sequential pneumatic compression pump as adjunct to LE self MLD.  Pt verbalized understanding of benefits, precautions and contraindications, and care and use routines.    Person(s) Educated Patient   Methods Explanation;Demonstration;Tactile cues;Verbal cues;Handout   Comprehension Verbalized understanding;Returned demonstration;Verbal cues required;Tactile cues required;Need further  instruction             OT Long Term Goals - 03/12/17 1449      OT LONG TERM GOAL #1   Title Pt independent w/ lymphedema precautions and prevention principals and strategies to limit LE progression and infection risk.   Baseline dependent   Time 1   Period Weeks   Status Achieved     OT LONG TERM GOAL #2   Title Lymphedema (LE) management/ self-care: Pt able to apply multi layered, gradient compression wraps w Max caregiver assistance using proper techniques within 2 weeks to achieve optimal limb volume reductions bilaterally.   Baseline dependent   Time 2   Period Weeks   Status Achieved     OT LONG TERM GOAL #3   Title Lymphedema (LE) management/ self-care:  Pt to achieve at least 10% limb volume reductions bilaterally during Intensive CDT to limit LE progression, to reduce pain, and to improve safe ambulation and functional mobility.   Baseline dependent   Time 12   Period Weeks   Status Partially Met     OT LONG TERM GOAL #4   Title Lymphedema (LE) management/ self-care:  Pt to tolerate daily compression wraps, garments and devices in keeping w/ prescribed wear regime within 1 week of issue date to progress and retain clinical and functional gains and to limit LE progression.   Baseline dependent   Time 12   Period Weeks   Status Partially Met     OT LONG TERM GOAL #5   Title Lymphedema (LE) management/ self-care:  During Management Phase CDT Pt to sustain current limb volumes within 5%, and all other clinical gains achieved during OT treatment with needed level of caregiver assistance to limit LE progression, infection risk and further functional decline.   Baseline dependent   Time 6   Period Months   Status On-going               Plan - 03/26/17 1734    Clinical Impression Statement Pt tolerated 30 minute trial of Flexitouch sequential pneumatic compression pump to LLE below knee only today. DME vendor was unable to complete trial for full leg with  trunk piece because large enough   garment was unavaiable . Post treatment circumferential measurements at both L ankle and calf showed 1 cm decreases . Thigh and knee measurements were unchanged at post trial measurement. Pt will benefit from daily use of the Flexitouch sequential pneumatic compression device to assist w/ long term lymphedema self-management needed to reduce infection risk, to limit lymphedema progression, and to limit further functional decline over time.    Rehab Potential Good   OT Frequency 3x / week   OT Duration 12 weeks   OT Treatment/Interventions Self-care/ADL training;DME and/or AE instruction;Manual lymph drainage;Patient/family education;Compression bandaging;Therapeutic exercises;Therapeutic activities;Therapeutic exercise;Energy conservation;Manual Therapy;Other (comment)  skin care   Consulted and Agree with Plan of Care Patient      Patient will benefit from skilled therapeutic intervention in order to improve the following deficits  and impairments:  Abnormal gait, Decreased knowledge of use of DME, Decreased skin integrity, Increased edema, Impaired flexibility, Pain, Decreased mobility, Decreased scar mobility, Decreased endurance, Decreased range of motion, Decreased strength, Decreased balance, Decreased knowledge of precautions, Difficulty walking, Obesity  Visit Diagnosis: Lymphedema, not elsewhere classified    Problem List Patient Active Problem List   Diagnosis Date Noted  . Cellulitis of leg, left 01/16/2017  . Cellulitis of leg, right 12/23/2016  . AKI (acute kidney injury) ([place]) 12/19/2016  . Hyponatremia 12/19/2016  . Pressure injury of skin 12/19/2016  . Encephalopathy, hepatic ([place]) 11/20/2015  . Hepatic cirrhosis ([place]) 11/20/2015  . Thrombocytopenia ([place])   . Disorientation 11/19/2015  . Stroke or transient ischemic attack (TIA) diagnosed during current admission 11/19/2015  . OSA on CPAP 05/02/2015  . Hemorrhagic cystitis 01/29/2015   . Sepsis ([place]) 01/29/2015  . Flank pain 01/29/2015  . Sinus tachycardia 01/29/2015  . Lymphedema 01/29/2015  . Morbid obesity with body mass index of 50.0-59.9 in adult [facility]) 01/25/2015  . Obesity hypoventilation syndrome ([place]) 01/25/2015  . Snoring   . High cholesterol   . TIA (transient ischemic attack) 01/10/2015  . Morbid obesity ([place]) 01/10/2015  . Encephalopathy acute 01/09/2015  . HTN (hypertension) 01/09/2015  . Borderline diabetic 01/09/2015  . GERD (gastroesophageal reflux disease) 01/09/2015  . Acute encephalopathy    Andrey Spearman, MS, OTR/L, [facility] 03/26/17 5:45 PM   [place] MAIN [facility] SERVICES [address] [place], [place], [address] Phone: [phone]   Fax:  [phone]  Name: MITHCELL SCHUMPERT MRN: [id number] Date of Birth: 10/19/1971

## 2017-03-27 ENCOUNTER — Ambulatory Visit: Payer: BLUE CROSS/BLUE SHIELD | Attending: Surgery | Admitting: Occupational Therapy

## 2017-03-27 DIAGNOSIS — I890 Lymphedema, not elsewhere classified: Secondary | ICD-10-CM | POA: Diagnosis not present

## 2017-03-27 NOTE — Therapy (Signed)
[place] MAIN [facility] SERVICES [address] [place], [place], [address] Phone: [phone]   Fax:  [phone]  Occupational Therapy Treatment  Patient Details  Name: Ryan Burgess MRN: [id number] Date of Birth: 12/02/1971 Referring Provider: Christin Fudge, MD  Encounter Date: 03/27/2017      OT End of Session - 03/27/17 1100    Visit Number 17   Number of Visits 36   Date for OT Re-Evaluation 05/14/17   OT Start Time 0907   OT Stop Time 1007   OT Time Calculation (min) 60 min   Activity Tolerance Patient tolerated treatment well;No increased pain;Other (comment)  limited by body habitus   Behavior During Therapy [facility] [place] for tasks assessed/performed      Past Medical History:  Diagnosis Date  . Cellulitis   . Cirrhosis, nonalcoholic ([place])   . GERD (gastroesophageal reflux disease)   . Headache    "maybe monthly" (12/19/2016)  . Hepatic encephalopathy ([place]) ~ 2016 X 2   "first time they thought it was a TIA; dx'd hepatic encephalopathy the 2nd time it happened"  . High cholesterol   . Hypertension   . Kidney stones 01/2015   UTI  . Migraine    "none in years; used to have them frequently" (12/19/2016)  . Obesity   . OSA on CPAP   . Pre-diabetes   . Snoring   . Venous stasis     Past Surgical History:  Procedure Laterality Date  . ESOPHAGOGASTRODUODENOSCOPY (EGD) WITH PROPOFOL N/A 07/26/2015   Procedure: ESOPHAGOGASTRODUODENOSCOPY (EGD) WITH PROPOFOL;  Surgeon: Arta Silence, MD;  Location: [facility];  Service: Endoscopy;  Laterality: N/A;  . TEE WITHOUT CARDIOVERSION N/A 04/03/2015   Procedure: TRANSESOPHAGEAL ECHOCARDIOGRAM (TEE);  Surgeon: Adrian Prows, MD;  Location: [place];  Service: Cardiovascular;  Laterality: N/A;  . WISDOM TOOTH EXTRACTION  1990    There were no vitals filed for this visit.      Subjective Assessment - 03/27/17 0845    Subjective  Pt returns for OT visit 17/  36 for CDT to BLE w/ emphasis on  LLE  initially, then RLE. Pt has no new complaints.   Pertinent History Recent episode of LE cellulitis and sepsis w/ hospitalization; hx TIA, DM, morbid obesity, OSA, depression; non-smoker   Limitations Difficulty walking, difficulty w/ functional mobility and transfers, decreased ability to perform basic and instrumental ADLs (LB dressing and bathing, fitting and donning shoes and socks, difficulty performing shoping and  all home management tasks requiring standing and walking) Difficulty performing productive and work activities, restricted social participation in the community   Repetition Increases Symptoms   Patient Stated Goals decrease leg swelling and keep it from getting worse so I can do more   Currently in Pain? Yes  chronic BLE and back pain- not numerically rated.    Pain Onset --  chronic                              OT Education - 03/27/17 0846    Education provided Yes   Education Details Continued skilled Pt/caregiver education  And LE ADL training throughout visit for lymphedema self care/ home program, including compression wrapping, compression garment and device wear/care, lymphatic pumping ther ex, simple self-MLD, and skin care. Discussed progress towards goals.   Person(s) Educated Patient   Methods Explanation;Demonstration   Comprehension Verbalized understanding  OT Long Term Goals - 03/12/17 1449      OT LONG TERM GOAL #1   Title Pt independent w/ lymphedema precautions and prevention principals and strategies to limit LE progression and infection risk.   Baseline dependent   Time 1   Period Weeks   Status Achieved     OT LONG TERM GOAL #2   Title Lymphedema (LE) management/ self-care: Pt able to apply multi layered, gradient compression wraps w Max caregiver assistance using proper techniques within 2 weeks to achieve optimal limb volume reductions bilaterally.   Baseline dependent   Time 2   Period Weeks    Status Achieved     OT LONG TERM GOAL #3   Title Lymphedema (LE) management/ self-care:  Pt to achieve at least 10% limb volume reductions bilaterally during Intensive CDT to limit LE progression, to reduce pain, and to improve safe ambulation and functional mobility.   Baseline dependent   Time 12   Period Weeks   Status Partially Met     OT LONG TERM GOAL #4   Title Lymphedema (LE) management/ self-care:  Pt to tolerate daily compression wraps, garments and devices in keeping w/ prescribed wear regime within 1 week of issue date to progress and retain clinical and functional gains and to limit LE progression.   Baseline dependent   Time 12   Period Weeks   Status Partially Met     OT LONG TERM GOAL #5   Title Lymphedema (LE) management/ self-care:  During Management Phase CDT Pt to sustain current limb volumes within 5%, and all other clinical gains achieved during OT treatment with needed level of caregiver assistance to limit LE progression, infection risk and further functional decline.   Baseline dependent   Time 6   Period Months   Status On-going               Plan - 03/27/17 0847    Clinical Impression Statement Pt continues to demonstrate steady progress towards all OT goals for LE self care. Pt tolerated all aspects of manual therapy and Pt edu today without difficulty, including MLD, skin care and compression wraps. Manufacturer's rep from [employer] [place] to assist on weds, 04/02/2017 w/ custom LLE compression garment measurement.   Rehab Potential Good   OT Frequency 3x / week   OT Duration 12 weeks   OT Treatment/Interventions Self-care/ADL training;DME and/or AE instruction;Manual lymph drainage;Patient/family education;Compression bandaging;Therapeutic exercises;Therapeutic activities;Therapeutic exercise;Energy conservation;Manual Therapy;Other (comment)  skin care   Consulted and Agree with Plan of Care Patient      Patient will benefit from skilled therapeutic  intervention in order to improve the following deficits and impairments:  Abnormal gait, Decreased knowledge of use of DME, Decreased skin integrity, Increased edema, Impaired flexibility, Pain, Decreased mobility, Decreased scar mobility, Decreased endurance, Decreased range of motion, Decreased strength, Decreased balance, Decreased knowledge of precautions, Difficulty walking, Obesity  Visit Diagnosis: Lymphedema, not elsewhere classified    Problem List Patient Active Problem List   Diagnosis Date Noted  . Cellulitis of leg, left 01/16/2017  . Cellulitis of leg, right 12/23/2016  . AKI (acute kidney injury) (HCC) 12/19/2016  . Hyponatremia 12/19/2016  . Pressure injury of skin 12/19/2016  . Encephalopathy, hepatic (HCC) 11/20/2015  . Hepatic cirrhosis (HCC) 11/20/2015  . Thrombocytopenia (HCC)   . Disorientation 11/19/2015  . Stroke or transient ischemic attack (TIA) diagnosed during current admission 11/19/2015  . OSA on CPAP 05/02/2015  . Hemorrhagic cystitis 01/29/2015  . Sepsis (HCC)  01/29/2015  . Flank pain 01/29/2015  . Sinus tachycardia 01/29/2015  . Lymphedema 01/29/2015  . Morbid obesity with body mass index of 50.0-59.9 in adult [facility]) 01/25/2015  . Obesity hypoventilation syndrome ([place]) 01/25/2015  . Snoring   . High cholesterol   . TIA (transient ischemic attack) 01/10/2015  . Morbid obesity ([place]) 01/10/2015  . Encephalopathy acute 01/09/2015  . HTN (hypertension) 01/09/2015  . Borderline diabetic 01/09/2015  . GERD (gastroesophageal reflux disease) 01/09/2015  . Acute encephalopathy     Andrey Spearman, MS, OTR/L, [facility] 03/27/17 11:02 AM   [place] MAIN [facility] SERVICES [address] [place], [place], [address] Phone: [phone]   Fax:  [phone]  Name: LYONEL MOREJON MRN: [id number] Date of Birth: Mar 07, 1971

## 2017-03-31 ENCOUNTER — Ambulatory Visit: Payer: BLUE CROSS/BLUE SHIELD | Admitting: Occupational Therapy

## 2017-03-31 DIAGNOSIS — I890 Lymphedema, not elsewhere classified: Secondary | ICD-10-CM | POA: Diagnosis not present

## 2017-03-31 NOTE — Therapy (Signed)
[place] MAIN [facility] SERVICES [address] [place], [place], [address] Phone: [phone]   Fax:  [phone]  Occupational Therapy Treatment  Patient Details  Name: Ryan Burgess MRN: [id number] Date of Birth: October 31, 1971 Referring Provider: Christin Fudge, MD  Encounter Date: 03/31/2017      OT End of Session - 03/31/17 1741    Visit Number 18   Number of Visits 36   Date for OT Re-Evaluation 05/14/17   OT Start Time 0212   OT Stop Time 0300   OT Time Calculation (min) 48 min   Activity Tolerance Patient tolerated treatment well;No increased pain;Other (comment)  limited by body habitus   Behavior During Therapy [facility] for tasks assessed/performed      Past Medical History:  Diagnosis Date  . Cellulitis   . Cirrhosis, nonalcoholic ([place])   . GERD (gastroesophageal reflux disease)   . Headache    "maybe monthly" (12/19/2016)  . Hepatic encephalopathy ([place]) ~ 2016 X 2   "first time they thought it was a TIA; dx'd hepatic encephalopathy the 2nd time it happened"  . High cholesterol   . Hypertension   . Kidney stones 01/2015   UTI  . Migraine    "none in years; used to have them frequently" (12/19/2016)  . Obesity   . OSA on CPAP   . Pre-diabetes   . Snoring   . Venous stasis     Past Surgical History:  Procedure Laterality Date  . ESOPHAGOGASTRODUODENOSCOPY (EGD) WITH PROPOFOL N/A 07/26/2015   Procedure: ESOPHAGOGASTRODUODENOSCOPY (EGD) WITH PROPOFOL;  Surgeon: Arta Silence, MD;  [facility];  Service: Endoscopy;  Laterality: N/A;  . TEE WITHOUT CARDIOVERSION N/A 04/03/2015   Procedure: TRANSESOPHAGEAL ECHOCARDIOGRAM (TEE);  Surgeon: Adrian Prows, MD;  Location: [place];  Service: Cardiovascular;  Laterality: N/A;  . WISDOM TOOTH EXTRACTION  1990    There were no vitals filed for this visit.      Subjective Assessment - 03/31/17 1739    Subjective  Pt returns for OT visit 18/  36 for CDT to BLE w/ emphasis on  LLE  initially, then RLE. Pt has no new complaints. Rehab aid assists with compression wrap removal and reapplication today.   Pertinent History Recent episode of LE cellulitis and sepsis w/ hospitalization; hx TIA, DM, morbid obesity, OSA, depression; non-smoker   Limitations Difficulty walking, difficulty w/ functional mobility and transfers, decreased ability to perform basic and instrumental ADLs (LB dressing and bathing, fitting and donning shoes and socks, difficulty performing shoping and  all home management tasks requiring standing and walking) Difficulty performing productive and work activities, restricted social participation in the community   Repetition Increases Symptoms   Patient Stated Goals decrease leg swelling and keep it from getting worse so I can do more   Currently in Pain? No/denies   Pain Onset --  chronic                      OT Treatments/Exercises (OP) - 03/31/17 0001      ADLs   ADL Education Given Yes     Manual Therapy   Manual Therapy Edema management;Manual Lymphatic Drainage (MLD);Compression Bandaging;Other (comment)  Sequential Pneumatic Compression Pump trial- [employer]   Manual Lymphatic Drainage (MLD) NO MLD today 2/2    Compression Bandaging no clinical wraps  today 2/2 pump trial. Pt to apply wraps at home w/ spouse's assistance  OT Education - 03/31/17 1740    Education provided Yes   Education Details Continued skilled Pt/caregiver education  And LE ADL training throughout visit for lymphedema self care/ home program, including compression wrapping, compression garment and device wear/care, lymphatic pumping ther ex, simple self-MLD, and skin care. Discussed progress towards goals.   Person(s) Educated Patient   Methods Explanation   Comprehension Verbalized understanding             OT Long Term Goals - 03/12/17 1449      OT LONG TERM GOAL #1   Title Pt independent w/ lymphedema precautions  and prevention principals and strategies to limit LE progression and infection risk.   Baseline dependent   Time 1   Period Weeks   Status Achieved     OT LONG TERM GOAL #2   Title Lymphedema (LE) management/ self-care: Pt able to apply multi layered, gradient compression wraps w Max caregiver assistance using proper techniques within 2 weeks to achieve optimal limb volume reductions bilaterally.   Baseline dependent   Time 2   Period Weeks   Status Achieved     OT LONG TERM GOAL #3   Title Lymphedema (LE) management/ self-care:  Pt to achieve at least 10% limb volume reductions bilaterally during Intensive CDT to limit LE progression, to reduce pain, and to improve safe ambulation and functional mobility.   Baseline dependent   Time 12   Period Weeks   Status Partially Met     OT LONG TERM GOAL #4   Title Lymphedema (LE) management/ self-care:  Pt to tolerate daily compression wraps, garments and devices in keeping w/ prescribed wear regime within 1 week of issue date to progress and retain clinical and functional gains and to limit LE progression.   Baseline dependent   Time 12   Period Weeks   Status Partially Met     OT LONG TERM GOAL #5   Title Lymphedema (LE) management/ self-care:  During Management Phase CDT Pt to sustain current limb volumes within 5%, and all other clinical gains achieved during OT treatment with needed level of caregiver assistance to limit LE progression, infection risk and further functional decline.   Baseline dependent   Time 6   Period Months   Status On-going               Plan - 03/31/17 1741    Clinical Impression Statement LLE swelling very well managed today after weekend interval. Pt and spouse are doing a very nice job with skin care and compression wrapping. Pt learning simple self MLD each session. Pt tolerated MLD, skin care, compression wrapping and self care edu without difficulty. Manufacturer's rep to addend next appointment  with [place] to assist w/ LLE compression garment specifications and measurments. Cont as per POC. Transition to RLE CDT one LLE garment is fitted.   Rehab Potential Good   OT Frequency 3x / week   OT Duration 12 weeks   OT Treatment/Interventions Self-care/ADL training;DME and/or AE instruction;Manual lymph drainage;Patient/family education;Compression bandaging;Therapeutic exercises;Therapeutic activities;Therapeutic exercise;Energy conservation;Manual Therapy;Other (comment)  skin care   Consulted and Agree with Plan of Care Patient      Patient will benefit from skilled therapeutic intervention in order to improve the following deficits and impairments:  Decreased knowledge of use of DME, Decreased skin integrity, Increased edema, Impaired flexibility, Pain, Decreased mobility, Decreased scar mobility, Decreased endurance, Decreased range of motion, Decreased strength, Decreased balance, Decreased knowledge of precautions, Difficulty walking, Obesity, Abnormal gait  Visit Diagnosis: Lymphedema    Problem List Patient Active Problem List   Diagnosis Date Noted  . Cellulitis of leg, left 01/16/2017  . Cellulitis of leg, right 12/23/2016  . AKI (acute kidney injury) ([place]) 12/19/2016  . Hyponatremia 12/19/2016  . Pressure injury of skin 12/19/2016  . Encephalopathy, hepatic ([place]) 11/20/2015  . Hepatic cirrhosis ([place]) 11/20/2015  . Thrombocytopenia ([place])   . Disorientation 11/19/2015  . Stroke or transient ischemic attack (TIA) diagnosed during current admission 11/19/2015  . OSA on CPAP 05/02/2015  . Hemorrhagic cystitis 01/29/2015  . Sepsis ([place]) 01/29/2015  . Flank pain 01/29/2015  . Sinus tachycardia 01/29/2015  . Lymphedema 01/29/2015  . Morbid obesity with body mass index of 50.0-59.9 in adult [facility]) 01/25/2015  . Obesity hypoventilation syndrome ([place]) 01/25/2015  . Snoring   . High cholesterol   . TIA (transient ischemic attack) 01/10/2015  . Morbid obesity ([place]) 01/10/2015   . Encephalopathy acute 01/09/2015  . HTN (hypertension) 01/09/2015  . Borderline diabetic 01/09/2015  . GERD (gastroesophageal reflux disease) 01/09/2015  . Acute encephalopathy     Andrey Spearman, MS, OTR/L, [facility] 03/31/17 5:45 PM  [place] MAIN [facility] SERVICES [address] [place], [place], [address] Phone: [phone]   Fax:  [phone]  Name: Ryan Burgess MRN: [id number] Date of Birth: 1970/12/14

## 2017-04-02 ENCOUNTER — Ambulatory Visit: Payer: BLUE CROSS/BLUE SHIELD | Attending: Surgery | Admitting: Occupational Therapy

## 2017-04-02 DIAGNOSIS — I890 Lymphedema, not elsewhere classified: Secondary | ICD-10-CM | POA: Diagnosis present

## 2017-04-02 NOTE — Therapy (Signed)
[place] MAIN [facility] SERVICES [address] [place], [place], [address] Phone: [phone]   Fax:  [phone]  Occupational Therapy Treatment  Patient Details  Name: Ryan Burgess MRN: [id number] Date of Birth: 05-30-1971 Referring Provider: Christin Fudge, MD  Encounter Date: 04/02/2017      OT End of Session - 04/02/17 1224    Visit Number 19   Number of Visits 36   Date for OT Re-Evaluation 05/14/17   OT Start Time 0805   OT Stop Time 0925   OT Time Calculation (min) 80 min   Activity Tolerance Patient tolerated treatment well;No increased pain;Other (comment)  limited by body habitus   Behavior During Therapy [facility] for tasks assessed/performed      Past Medical History:  Diagnosis Date  . Cellulitis   . Cirrhosis, nonalcoholic ([place])   . GERD (gastroesophageal reflux disease)   . Headache    "maybe monthly" (12/19/2016)  . Hepatic encephalopathy ([place]) ~ 2016 X 2   "first time they thought it was a TIA; dx'd hepatic encephalopathy the 2nd time it happened"  . High cholesterol   . Hypertension   . Kidney stones 01/2015   UTI  . Migraine    "none in years; used to have them frequently" (12/19/2016)  . Obesity   . OSA on CPAP   . Pre-diabetes   . Snoring   . Venous stasis     Past Surgical History:  Procedure Laterality Date  . ESOPHAGOGASTRODUODENOSCOPY (EGD) WITH PROPOFOL N/A 07/26/2015   Procedure: ESOPHAGOGASTRODUODENOSCOPY (EGD) WITH PROPOFOL;  Surgeon: Arta Silence, MD;  Location: [facility];  Service: Endoscopy;  Laterality: N/A;  . TEE WITHOUT CARDIOVERSION N/A 04/03/2015   Procedure: TRANSESOPHAGEAL ECHOCARDIOGRAM (TEE);  Surgeon: Adrian Prows, MD;  Location: [place];  Service: Cardiovascular;  Laterality: N/A;  . WISDOM TOOTH EXTRACTION  1990    There were no vitals filed for this visit.      Subjective Assessment - 04/02/17 1220    Subjective  Pt returns for OT visit 19/  36 for CDT to BLE w/ emphasis on  LLE  initially, then RLE. Pt has no new complaints. Hildred Priest , rep for [employer], here today to assist w/ cmpression grment specifications and measurments .   Pertinent History Recent episode of LE cellulitis and sepsis w/ hospitalization; hx TIA, DM, morbid obesity, OSA, depression; non-smoker   Limitations Difficulty walking, difficulty w/ functional mobility and transfers, decreased ability to perform basic and instrumental ADLs (LB dressing and bathing, fitting and donning shoes and socks, difficulty performing shoping and  all home management tasks requiring standing and walking) Difficulty performing productive and work activities, restricted social participation in the community   Repetition Increases Symptoms   Patient Stated Goals decrease leg swelling and keep it from getting worse so I can do more   Currently in Pain? No/denies   Pain Onset --  chronic                      OT Treatments/Exercises (OP) - 04/02/17 0001      ADLs   ADL Education Given Yes     Manual Therapy   Manual Therapy Edema management;Manual Lymphatic Drainage (MLD);Compression Bandaging;Other (comment)  Sequential Pneumatic Compression Pump trial- [employer]   Manual therapy comments LLE anatomical measurements for Elvarex custom compression toe cap and knee cap.   Manual Lymphatic Drainage (MLD) NO MLD today 2/2    Compression Bandaging no clinical wraps  today 2/2 pump trial. Pt to apply wraps at home w/ spouse's assistance                OT Education - 04/02/17 1223    Education provided Yes   Education Details Emphasis of Pt edu for LE self care on custom compression garment specifications based on clinical needs and medical necessity   Person(s) Educated Patient   Methods Explanation;Demonstration   Comprehension Verbalized understanding;Need further instruction             OT Long Term Goals - 03/12/17 1449      OT LONG TERM GOAL #1   Title Pt independent w/  lymphedema precautions and prevention principals and strategies to limit LE progression and infection risk.   Baseline dependent   Time 1   Period Weeks   Status Achieved     OT LONG TERM GOAL #2   Title Lymphedema (LE) management/ self-care: Pt able to apply multi layered, gradient compression wraps w Max caregiver assistance using proper techniques within 2 weeks to achieve optimal limb volume reductions bilaterally.   Baseline dependent   Time 2   Period Weeks   Status Achieved     OT LONG TERM GOAL #3   Title Lymphedema (LE) management/ self-care:  Pt to achieve at least 10% limb volume reductions bilaterally during Intensive CDT to limit LE progression, to reduce pain, and to improve safe ambulation and functional mobility.   Baseline dependent   Time 12   Period Weeks   Status Partially Met     OT LONG TERM GOAL #4   Title Lymphedema (LE) management/ self-care:  Pt to tolerate daily compression wraps, garments and devices in keeping w/ prescribed wear regime within 1 week of issue date to progress and retain clinical and functional gains and to limit LE progression.   Baseline dependent   Time 12   Period Weeks   Status Partially Met     OT LONG TERM GOAL #5   Title Lymphedema (LE) management/ self-care:  During Management Phase CDT Pt to sustain current limb volumes within 5%, and all other clinical gains achieved during OT treatment with needed level of caregiver assistance to limit LE progression, infection risk and further functional decline.   Baseline dependent   Time 6   Period Months   Status On-going               Plan - 04/02/17 1225    Clinical Impression Statement Completed anatimical measurements for LLE, custom, ccl 3 (35-45 mmHg) flat.  knit, [employer] ELVAREX and ccl 2 ELVAREX custom toe cap. Team decided to hold off on measurements today for custom bike short + capri combo garment as R thigh is still too large to measure. Team agree'd to commence  wrapping fulll RLE and scheduled a meeting again in 3 weeks to complete RLE measurements , including capri length garment. Commence RLE compression wrapping and treatment next visit. Cont wraps to LLE as tolerated.   Rehab Potential Good   OT Frequency 3x / week   OT Duration 12 weeks   OT Treatment/Interventions Self-care/ADL training;DME and/or AE instruction;Manual lymph drainage;Patient/family education;Compression bandaging;Therapeutic exercises;Therapeutic activities;Therapeutic exercise;Energy conservation;Manual Therapy;Other (comment)  skin care   Consulted and Agree with Plan of Care Patient      Patient will benefit from skilled therapeutic intervention in order to improve the following deficits and impairments:  Decreased knowledge of use of DME, Decreased skin integrity, Increased edema, Impaired flexibility, Pain, Decreased  mobility, Decreased scar mobility, Decreased endurance, Decreased range of motion, Decreased strength, Decreased balance, Decreased knowledge of precautions, Difficulty walking, Obesity  Visit Diagnosis: Lymphedema, not elsewhere classified    Problem List Patient Active Problem List   Diagnosis Date Noted  . Cellulitis of leg, left 01/16/2017  . Cellulitis of leg, right 12/23/2016  . AKI (acute kidney injury) ([place]) 12/19/2016  . Hyponatremia 12/19/2016  . Pressure injury of skin 12/19/2016  . Encephalopathy, hepatic ([place]) 11/20/2015  . Hepatic cirrhosis ([place]) 11/20/2015  . Thrombocytopenia ([place])   . Disorientation 11/19/2015  . Stroke or transient ischemic attack (TIA) diagnosed during current admission 11/19/2015  . OSA on CPAP 05/02/2015  . Hemorrhagic cystitis 01/29/2015  . Sepsis ([place]) 01/29/2015  . Flank pain 01/29/2015  . Sinus tachycardia 01/29/2015  . Lymphedema 01/29/2015  . Morbid obesity with body mass index of 50.0-59.9 in adult [facility]) 01/25/2015  . Obesity hypoventilation syndrome ([place]) 01/25/2015  . Snoring   . High cholesterol    . TIA (transient ischemic attack) 01/10/2015  . Morbid obesity ([place]) 01/10/2015  . Encephalopathy acute 01/09/2015  . HTN (hypertension) 01/09/2015  . Borderline diabetic 01/09/2015  . GERD (gastroesophageal reflux disease) 01/09/2015  . Acute encephalopathy      Andrey Spearman, MS, OTR/L, [facility] 04/02/17 12:31 PM  [place] MAIN [facility] SERVICES [address] [place], [place], [address] Phone: [phone]   Fax:  [phone]  Name: Ryan Burgess MRN: [id number] Date of Birth: [birth year]

## 2017-04-04 ENCOUNTER — Ambulatory Visit: Payer: BLUE CROSS/BLUE SHIELD | Admitting: Occupational Therapy

## 2017-04-04 DIAGNOSIS — I890 Lymphedema, not elsewhere classified: Secondary | ICD-10-CM | POA: Diagnosis not present

## 2017-04-04 NOTE — Therapy (Signed)
[place] MAIN [facility] SERVICES [address] [place], [place], [address] Phone: [phone]   Fax:  [phone]  Occupational Therapy Treatment Note and Progress Report  Patient Details  Name: Ryan Burgess MRN: [id number] Date of Birth: 06/10/1971 Referring Provider: Christin Fudge, MD  Encounter Date: 04/04/2017      OT End of Session - 04/04/17 0817    Visit Number 20   Number of Visits 36   Date for OT Re-Evaluation 05/14/17   OT Start Time 0802   OT Stop Time 0915   OT Time Calculation (min) 73 min   Activity Tolerance Patient tolerated treatment well;No increased pain;Other (comment)  limited by body habitus   Behavior During Therapy [facility] for tasks assessed/performed      Past Medical History:  Diagnosis Date  . Cellulitis   . Cirrhosis, nonalcoholic ([place])   . GERD (gastroesophageal reflux disease)   . Headache    "maybe monthly" (12/19/2016)  . Hepatic encephalopathy ([place]) ~ 2016 X 2   "first time they thought it was a TIA; dx'd hepatic encephalopathy the 2nd time it happened"  . High cholesterol   . Hypertension   . Kidney stones 01/2015   UTI  . Migraine    "none in years; used to have them frequently" (12/19/2016)  . Obesity   . OSA on CPAP   . Pre-diabetes   . Snoring   . Venous stasis     Past Surgical History:  Procedure Laterality Date  . ESOPHAGOGASTRODUODENOSCOPY (EGD) WITH PROPOFOL N/A 07/26/2015   Procedure: ESOPHAGOGASTRODUODENOSCOPY (EGD) WITH PROPOFOL;  Surgeon: Arta Silence, MD;  Location: [facility];  Service: Endoscopy;  Laterality: N/A;  . TEE WITHOUT CARDIOVERSION N/A 04/03/2015   Procedure: TRANSESOPHAGEAL ECHOCARDIOGRAM (TEE);  Surgeon: Adrian Prows, MD;  Location: [place];  Service: Cardiovascular;  Laterality: N/A;  . WISDOM TOOTH EXTRACTION  1990    There were no vitals filed for this visit.      Subjective Assessment - 04/04/17 1203    Subjective  Pt returns for OT visit 20/  36 for  CDT to BLE w/ emphasis on LLE  initially, then RLE. Pt has no new complaints. Pt presents with compression wraps in place. OT issued additional wraps to achieve thigh length wrap on RLE.   Pertinent History Recent episode of LE cellulitis and sepsis w/ hospitalization; hx TIA, DM, morbid obesity, OSA, depression; non-smoker   Limitations Difficulty walking, difficulty w/ functional mobility and transfers, decreased ability to perform basic and instrumental ADLs (LB dressing and bathing, fitting and donning shoes and socks, difficulty performing shoping and  all home management tasks requiring standing and walking) Difficulty performing productive and work activities, restricted social participation in the community   Repetition Increases Symptoms   Patient Stated Goals decrease leg swelling and keep it from getting worse so I can do more   Currently in Pain? No/denies   Pain Onset --  chronic                      OT Treatments/Exercises (OP) - 04/04/17 0001      ADLs   ADL Education Given Yes     Manual Therapy   Manual Therapy Edema management;Manual Lymphatic Drainage (MLD);Compression Bandaging;Other (comment)   Manual Lymphatic Drainage (MLD) MLD to LLE as established   Compression Bandaging LLE thigh length compressin wrap applied as established   Other Manual Therapy skin care during MLD w/ low pH castor oil to  LLE as established                OT Education - 04/04/17 1206    Education provided Yes   Education Details Continued skilled Pt/caregiver education  And LE ADL training throughout visit for lymphedema self care/ home program, including compression wrapping, compression garment and device wear/care, lymphatic pumping ther ex, simple self-MLD, and skin care. Discussed progress towards goals.   Person(s) Educated Patient   Methods Explanation;Demonstration   Comprehension Verbalized understanding             OT Long Term Goals - 04/04/17 1207       OT LONG TERM GOAL #1   Title Pt independent w/ lymphedema precautions and prevention principals and strategies to limit LE progression and infection risk.   Baseline dependent   Time 1   Period Weeks   Status Achieved     OT LONG TERM GOAL #2   Title Lymphedema (LE) management/ self-care: Pt able to apply multi layered, gradient compression wraps w Max caregiver assistance using proper techniques within 2 weeks to achieve optimal limb volume reductions bilaterally.   Baseline dependent   Time 2   Period Weeks   Status Achieved     OT LONG TERM GOAL #3   Title Lymphedema (LE) management/ self-care:  Pt to achieve at least 10% limb volume reductions bilaterally during Intensive CDT to limit LE progression, to reduce pain, and to improve safe ambulation and functional mobility.   Baseline in LLE   Time 12   Period Weeks   Status Partially Met     OT LONG TERM GOAL #4   Title Lymphedema (LE) management/ self-care:  Pt to tolerate daily compression wraps, garments and devices in keeping w/ prescribed wear regime within 1 week of issue date to progress and retain clinical and functional gains and to limit LE progression.   Baseline dependent   Time 12   Period Weeks   Status Partially Met     OT LONG TERM GOAL #5   Title Lymphedema (LE) management/ self-care:  During Management Phase CDT Pt to sustain current limb volumes within 5%, and all other clinical gains achieved during OT treatment with needed level of caregiver assistance to limit LE progression, infection risk and further functional decline.   Baseline dependent   Time 6   Period Months   Status Partially Met               Plan - 04/04/17 1210    Clinical Impression Statement Pt demonstrates excellent progress towards all OT goals to date. Limb volume reduction in LLE are nearly tripple goal range both below the knee and from ankle to thigh. Pt and spouse change wraps and perform skin care daily as instructewd.  Skin condition on the L has improved significantly, but still has a way to go towards WNL. Pt tolerates all aspects of clinical CDT. He is learning simple self MLD. He is fully engaged in all aspects of  OT for CDT.   Rehab Potential Good   OT Frequency 3x / week   OT Duration 12 weeks   OT Treatment/Interventions Self-care/ADL training;DME and/or AE instruction;Manual lymph drainage;Patient/family education;Compression bandaging;Therapeutic exercises;Therapeutic activities;Therapeutic exercise;Energy conservation;Manual Therapy;Other (comment)  skin care   Consulted and Agree with Plan of Care Patient      Patient will benefit from skilled therapeutic intervention in order to improve the following deficits and impairments:  Decreased knowledge of use of DME, Decreased skin integrity,  Increased edema, Impaired flexibility, Pain, Decreased mobility, Decreased scar mobility, Decreased endurance, Decreased range of motion, Decreased strength, Decreased balance, Decreased knowledge of precautions, Difficulty walking, Obesity  Visit Diagnosis: Lymphedema, not elsewhere classified    Problem List Patient Active Problem List   Diagnosis Date Noted  . Cellulitis of leg, left 01/16/2017  . Cellulitis of leg, right 12/23/2016  . AKI (acute kidney injury) ([place]) 12/19/2016  . Hyponatremia 12/19/2016  . Pressure injury of skin 12/19/2016  . Encephalopathy, hepatic ([place]) 11/20/2015  . Hepatic cirrhosis ([place]) 11/20/2015  . Thrombocytopenia ([place])   . Disorientation 11/19/2015  . Stroke or transient ischemic attack (TIA) diagnosed during current admission 11/19/2015  . OSA on CPAP 05/02/2015  . Hemorrhagic cystitis 01/29/2015  . Sepsis ([place]) 01/29/2015  . Flank pain 01/29/2015  . Sinus tachycardia 01/29/2015  . Lymphedema 01/29/2015  . Morbid obesity with body mass index of 50.0-59.9 in adult [facility]) 01/25/2015  . Obesity hypoventilation syndrome ([place]) 01/25/2015  . Snoring   . High cholesterol    . TIA (transient ischemic attack) 01/10/2015  . Morbid obesity ([place]) 01/10/2015  . Encephalopathy acute 01/09/2015  . HTN (hypertension) 01/09/2015  . Borderline diabetic 01/09/2015  . GERD (gastroesophageal reflux disease) 01/09/2015  . Acute encephalopathy     Andrey Spearman, MS, OTR/L, [facility] 04/04/17 12:17 PM   [place] MAIN [facility] SERVICES [address] [place], [place], [address] Phone: [phone]   Fax:  [phone]  Name: ISAO SELTZER MRN: [id number] Date of Birth: 12-14-1970

## 2017-04-07 ENCOUNTER — Ambulatory Visit: Payer: BLUE CROSS/BLUE SHIELD | Admitting: Occupational Therapy

## 2017-04-07 DIAGNOSIS — I890 Lymphedema, not elsewhere classified: Secondary | ICD-10-CM

## 2017-04-07 NOTE — Therapy (Signed)
[place] MAIN [facility] SERVICES [address] [place], [place], [address] Phone: [phone]   Fax:  [phone]  Occupational Therapy Treatment  Patient Details  Name: Ryan Burgess MRN: [id number] Date of Birth: 03-14-1971 Referring Provider: Christin Fudge, MD  Encounter Date: 04/07/2017      OT End of Session - 04/07/17 1117    Visit Number 21   Number of Visits 36   Date for OT Re-Evaluation 05/14/17   OT Start Time 0905   OT Stop Time 1020   OT Time Calculation (min) 75 min   Activity Tolerance Patient tolerated treatment well;No increased pain;Other (comment)  limited by body habitus   Behavior During Therapy [facility] for tasks assessed/performed      Past Medical History:  Diagnosis Date  . Cellulitis   . Cirrhosis, nonalcoholic ([place])   . GERD (gastroesophageal reflux disease)   . Headache    "maybe monthly" (12/19/2016)  . Hepatic encephalopathy ([place]) ~ 2016 X 2   "first time they thought it was a TIA; dx'd hepatic encephalopathy the 2nd time it happened"  . High cholesterol   . Hypertension   . Kidney stones 01/2015   UTI  . Migraine    "none in years; used to have them frequently" (12/19/2016)  . Obesity   . OSA on CPAP   . Pre-diabetes   . Snoring   . Venous stasis     Past Surgical History:  Procedure Laterality Date  . ESOPHAGOGASTRODUODENOSCOPY (EGD) WITH PROPOFOL N/A 07/26/2015   Procedure: ESOPHAGOGASTRODUODENOSCOPY (EGD) WITH PROPOFOL;  Surgeon: Arta Silence, MD;  [facility];  Service: Endoscopy;  Laterality: N/A;  . TEE WITHOUT CARDIOVERSION N/A 04/03/2015   Procedure: TRANSESOPHAGEAL ECHOCARDIOGRAM (TEE);  Surgeon: Adrian Prows, MD;  Location: [place];  Service: Cardiovascular;  Laterality: N/A;  . WISDOM TOOTH EXTRACTION  1990    There were no vitals filed for this visit.      Subjective Assessment - 04/07/17 1113    Subjective  Pt returns for OT visit 21/  36 for CDT to BLE w/ emphasis on  LLE  initially, then RLE. Pt has no new complaints. Pt presents with compression wraps in place.to knee length on R and thigh length on L.Pt agreeable to wrapping both legs to groin today in prep for compression garment measuring next wrrk for capri bike shorts.   Pertinent History Recent episode of LE cellulitis and sepsis w/ hospitalization; hx TIA, DM, morbid obesity, OSA, depression; non-smoker   Limitations Difficulty walking, difficulty w/ functional mobility and transfers, decreased ability to perform basic and instrumental ADLs (LB dressing and bathing, fitting and donning shoes and socks, difficulty performing shoping and  all home management tasks requiring standing and walking) Difficulty performing productive and work activities, restricted social participation in the community   Repetition Increases Symptoms   Patient Stated Goals decrease leg swelling and keep it from getting worse so I can do more   Currently in Pain? No/denies   Pain Onset --  chronic                      OT Treatments/Exercises (OP) - 04/07/17 0001      ADLs   ADL Education Given Yes     Manual Therapy   Manual Therapy Edema management;Compression Bandaging   Manual Lymphatic Drainage (MLD) no MLD today 2/2 time constraints   Compression Bandaging BLE thigh length compressin wrap applied as established   Other Manual  Therapy skin care to BLE feet and legs below knees before re-wrapping                OT Education - 04/07/17 1116    Education provided Yes   Education Details Reviewed cardiac and pulmonary precautions for BLE wraps to groin. Pt instructed to remove compression if he experiences unusual SOB, acute leg/ thigh pain, and / or signs /symptoms of infection or skin breakdown.   Person(s) Educated Patient   Methods Explanation   Comprehension Verbalized understanding             OT Long Term Goals - 04/04/17 1207      OT LONG TERM GOAL #1   Title Pt independent w/  lymphedema precautions and prevention principals and strategies to limit LE progression and infection risk.   Baseline dependent   Time 1   Period Weeks   Status Achieved     OT LONG TERM GOAL #2   Title Lymphedema (LE) management/ self-care: Pt able to apply multi layered, gradient compression wraps w Max caregiver assistance using proper techniques within 2 weeks to achieve optimal limb volume reductions bilaterally.   Baseline dependent   Time 2   Period Weeks   Status Achieved     OT LONG TERM GOAL #3   Title Lymphedema (LE) management/ self-care:  Pt to achieve at least 10% limb volume reductions bilaterally during Intensive CDT to limit LE progression, to reduce pain, and to improve safe ambulation and functional mobility.   Baseline in LLE   Time 12   Period Weeks   Status Partially Met     OT LONG TERM GOAL #4   Title Lymphedema (LE) management/ self-care:  Pt to tolerate daily compression wraps, garments and devices in keeping w/ prescribed wear regime within 1 week of issue date to progress and retain clinical and functional gains and to limit LE progression.   Baseline dependent   Time 12   Period Weeks   Status Partially Met     OT LONG TERM GOAL #5   Title Lymphedema (LE) management/ self-care:  During Management Phase CDT Pt to sustain current limb volumes within 5%, and all other clinical gains achieved during OT treatment with needed level of caregiver assistance to limit LE progression, infection risk and further functional decline.   Baseline dependent   Time 6   Period Months   Status Partially Met               Plan - 04/07/17 1118    Clinical Impression Statement Skin care bilaterally below knees in prep for re-applying compression wraps. Pt tolerated BLE  thigh length compression in clinic. Pt verbalized understanding of precautions in event of atypical SOB, acute paiin and swelling, and/or signs of skin  breakdown.  Cont as per POC.   Rehab  Potential Good   OT Frequency 3x / week   OT Duration 12 weeks   OT Treatment/Interventions Self-care/ADL training;DME and/or AE instruction;Manual lymph drainage;Patient/family education;Compression bandaging;Therapeutic exercises;Therapeutic activities;Therapeutic exercise;Energy conservation;Manual Therapy;Other (comment)  skin care   Consulted and Agree with Plan of Care Patient      Patient will benefit from skilled therapeutic intervention in order to improve the following deficits and impairments:  Decreased knowledge of use of DME, Decreased skin integrity, Increased edema, Impaired flexibility, Pain, Decreased mobility, Decreased scar mobility, Decreased endurance, Decreased range of motion, Decreased strength, Decreased balance, Decreased knowledge of precautions, Difficulty walking, Obesity  Visit Diagnosis: Lymphedema, not elsewhere  classified    Problem List Patient Active Problem List   Diagnosis Date Noted  . Cellulitis of leg, left 01/16/2017  . Cellulitis of leg, right 12/23/2016  . AKI (acute kidney injury) ([place]) 12/19/2016  . Hyponatremia 12/19/2016  . Pressure injury of skin 12/19/2016  . Encephalopathy, hepatic ([place]) 11/20/2015  . Hepatic cirrhosis ([place]) 11/20/2015  . Thrombocytopenia ([place])   . Disorientation 11/19/2015  . Stroke or transient ischemic attack (TIA) diagnosed during current admission 11/19/2015  . OSA on CPAP 05/02/2015  . Hemorrhagic cystitis 01/29/2015  . Sepsis ([place]) 01/29/2015  . Flank pain 01/29/2015  . Sinus tachycardia 01/29/2015  . Lymphedema 01/29/2015  . Morbid obesity with body mass index of 50.0-59.9 in adult [facility]) 01/25/2015  . Obesity hypoventilation syndrome ([place]) 01/25/2015  . Snoring   . High cholesterol   . TIA (transient ischemic attack) 01/10/2015  . Morbid obesity ([place]) 01/10/2015  . Encephalopathy acute 01/09/2015  . HTN (hypertension) 01/09/2015  . Borderline diabetic 01/09/2015  . GERD (gastroesophageal reflux  disease) 01/09/2015  . Acute encephalopathy     Andrey Spearman, MS, OTR/L, [facility] 04/07/17 11:21 AM  [place] MAIN [facility] SERVICES [address] [place], [place], [address] Phone: [phone]   Fax:  [phone]  Name: Ryan Burgess MRN: [id number] Date of Birth: Aug 10, 1971

## 2017-04-09 ENCOUNTER — Ambulatory Visit: Payer: BLUE CROSS/BLUE SHIELD | Admitting: Occupational Therapy

## 2017-04-09 DIAGNOSIS — I890 Lymphedema, not elsewhere classified: Secondary | ICD-10-CM | POA: Diagnosis not present

## 2017-04-09 NOTE — Therapy (Signed)
[place] MAIN [facility] SERVICES [address] [place], [place], [address] Phone: [phone]   Fax:  [phone]  Occupational Therapy Treatment  Patient Details  Name: Ryan Burgess MRN: [id number] Date of Birth: 10-05-1971 Referring Provider: Christin Fudge, MD  Encounter Date: 04/09/2017      OT End of Session - 04/09/17 1618    Visit Number 22   Number of Visits 36   Date for OT Re-Evaluation 05/14/17   OT Start Time 0905   OT Stop Time 1015   OT Time Calculation (min) 70 min   Activity Tolerance Patient tolerated treatment well;No increased pain;Other (comment)  limited by body habitus   Behavior During Therapy [facility] for tasks assessed/performed      Past Medical History:  Diagnosis Date  . Cellulitis   . Cirrhosis, nonalcoholic ([place])   . GERD (gastroesophageal reflux disease)   . Headache    "maybe monthly" (12/19/2016)  . Hepatic encephalopathy ([place]) ~ 2016 X 2   "first time they thought it was a TIA; dx'd hepatic encephalopathy the 2nd time it happened"  . High cholesterol   . Hypertension   . Kidney stones 01/2015   UTI  . Migraine    "none in years; used to have them frequently" (12/19/2016)  . Obesity   . OSA on CPAP   . Pre-diabetes   . Snoring   . Venous stasis     Past Surgical History:  Procedure Laterality Date  . ESOPHAGOGASTRODUODENOSCOPY (EGD) WITH PROPOFOL N/A 07/26/2015   Procedure: ESOPHAGOGASTRODUODENOSCOPY (EGD) WITH PROPOFOL;  Surgeon: Arta Silence, MD;  [facility];  Service: Endoscopy;  Laterality: N/A;  . TEE WITHOUT CARDIOVERSION N/A 04/03/2015   Procedure: TRANSESOPHAGEAL ECHOCARDIOGRAM (TEE);  Surgeon: Adrian Prows, MD;  Location: [place];  Service: Cardiovascular;  Laterality: N/A;  . WISDOM TOOTH EXTRACTION  1990    There were no vitals filed for this visit.      Subjective Assessment - 04/09/17 1616    Subjective  Pt returns for OT visit 22/  36 for CDT to BLE w/ emphasis on  LLE  initially, then RLE. Pt has no new complaints. Pt presents with BLE compression wraps in place from foot to groin.  Pt has no new complaints.   Pertinent History Recent episode of LE cellulitis and sepsis w/ hospitalization; hx TIA, DM, morbid obesity, OSA, depression; non-smoker   Limitations Difficulty walking, difficulty w/ functional mobility and transfers, decreased ability to perform basic and instrumental ADLs (LB dressing and bathing, fitting and donning shoes and socks, difficulty performing shoping and  all home management tasks requiring standing and walking) Difficulty performing productive and work activities, restricted social participation in the community   Repetition Increases Symptoms   Patient Stated Goals decrease leg swelling and keep it from getting worse so I can do more   Currently in Pain? No/denies   Pain Onset --  chronic                              OT Education - 04/09/17 1617    Education provided Yes   Education Details Continued skilled Pt/caregiver education  And LE ADL training throughout visit for lymphedema self care/ home program, including compression wrapping, compression garment and device wear/care, lymphatic pumping ther ex, simple self-MLD, and skin care. Discussed progress towards goals.   Person(s) Educated Patient   Methods Explanation;Demonstration   Comprehension Verbalized understanding;Returned demonstration  OT Long Term Goals - 04/04/17 1207      OT LONG TERM GOAL #1   Title Pt independent w/ lymphedema precautions and prevention principals and strategies to limit LE progression and infection risk.   Baseline dependent   Time 1   Period Weeks   Status Achieved     OT LONG TERM GOAL #2   Title Lymphedema (LE) management/ self-care: Pt able to apply multi layered, gradient compression wraps w Max caregiver assistance using proper techniques within 2 weeks to achieve optimal limb volume  reductions bilaterally.   Baseline dependent   Time 2   Period Weeks   Status Achieved     OT LONG TERM GOAL #3   Title Lymphedema (LE) management/ self-care:  Pt to achieve at least 10% limb volume reductions bilaterally during Intensive CDT to limit LE progression, to reduce pain, and to improve safe ambulation and functional mobility.   Baseline in LLE   Time 12   Period Weeks   Status Partially Met     OT LONG TERM GOAL #4   Title Lymphedema (LE) management/ self-care:  Pt to tolerate daily compression wraps, garments and devices in keeping w/ prescribed wear regime within 1 week of issue date to progress and retain clinical and functional gains and to limit LE progression.   Baseline dependent   Time 12   Period Weeks   Status Partially Met     OT LONG TERM GOAL #5   Title Lymphedema (LE) management/ self-care:  During Management Phase CDT Pt to sustain current limb volumes within 5%, and all other clinical gains achieved during OT treatment with needed level of caregiver assistance to limit LE progression, infection risk and further functional decline.   Baseline dependent   Time 6   Period Months   Status Partially Met               Plan - 04/09/17 1619    Clinical Impression Statement Pt tolerated BLE full leg wraps from foot to groin without any atypical SOB, no increased pain. He was able to ambulate as is typical for him, although he reports he moved a  bit slower. Reviewed LE cardio-pulmonary precautions during session, Pt verbalized understanding of recommendations to remove compression in the event of acute pain, signs/ symptoms of infection, acute increase in swelling, atypical SOB.  RLE limb volume shows positive response to treatment with visible and palpable swelling and tissue density reductions, but these are not as dramatic  after initial therapy as the LLE where LE is more progressed. Cont as per POC    Rehab Potential Good   OT Frequency 3x / week   OT  Duration 12 weeks   OT Treatment/Interventions Self-care/ADL training;DME and/or AE instruction;Manual lymph drainage;Patient/family education;Compression bandaging;Therapeutic exercises;Therapeutic activities;Therapeutic exercise;Energy conservation;Manual Therapy;Other (comment)  skin care   Consulted and Agree with Plan of Care Patient      Patient will benefit from skilled therapeutic intervention in order to improve the following deficits and impairments:  Decreased knowledge of use of DME, Decreased skin integrity, Increased edema, Impaired flexibility, Pain, Decreased mobility, Decreased scar mobility, Decreased endurance, Decreased range of motion, Decreased strength, Decreased balance, Decreased knowledge of precautions, Difficulty walking, Obesity  Visit Diagnosis: Lymphedema, not elsewhere classified    Problem List Patient Active Problem List   Diagnosis Date Noted  . Cellulitis of leg, left 01/16/2017  . Cellulitis of leg, right 12/23/2016  . AKI (acute kidney injury) ([place]) 12/19/2016  . Hyponatremia  12/19/2016  . Pressure injury of skin 12/19/2016  . Encephalopathy, hepatic ([place]) 11/20/2015  . Hepatic cirrhosis ([place]) 11/20/2015  . Thrombocytopenia ([place])   . Disorientation 11/19/2015  . Stroke or transient ischemic attack (TIA) diagnosed during current admission 11/19/2015  . OSA on CPAP 05/02/2015  . Hemorrhagic cystitis 01/29/2015  . Sepsis ([place]) 01/29/2015  . Flank pain 01/29/2015  . Sinus tachycardia 01/29/2015  . Lymphedema 01/29/2015  . Morbid obesity with body mass index of 50.0-59.9 in adult [facility]) 01/25/2015  . Obesity hypoventilation syndrome ([place]) 01/25/2015  . Snoring   . High cholesterol   . TIA (transient ischemic attack) 01/10/2015  . Morbid obesity ([place]) 01/10/2015  . Encephalopathy acute 01/09/2015  . HTN (hypertension) 01/09/2015  . Borderline diabetic 01/09/2015  . GERD (gastroesophageal reflux disease) 01/09/2015  . Acute encephalopathy      Andrey Spearman, MS, OTR/L, [facility] 04/09/17 4:24 PM  [place] MAIN [facility] SERVICES [address] [place], [place], [address] Phone: [phone]   Fax:  [phone]  Name: Ryan Burgess MRN: [id number] Date of Birth: 1971/01/19

## 2017-04-11 ENCOUNTER — Encounter: Payer: BLUE CROSS/BLUE SHIELD | Admitting: Occupational Therapy

## 2017-04-11 ENCOUNTER — Ambulatory Visit: Payer: BLUE CROSS/BLUE SHIELD | Admitting: Occupational Therapy

## 2017-04-11 DIAGNOSIS — I890 Lymphedema, not elsewhere classified: Secondary | ICD-10-CM | POA: Diagnosis not present

## 2017-04-11 NOTE — Therapy (Signed)
[place] MAIN [facility] SERVICES [address] [place], [place], [address] Phone: [phone]   Fax:  [phone]  Occupational Therapy Treatment  Patient Details  Name: ROYCE STEGMAN MRN: [id number] Date of Birth: 02-Feb-1971 Referring Provider: Christin Fudge, MD  Encounter Date: 04/11/2017      OT End of Session - 04/11/17 0959    Visit Number 23   Number of Visits 36   Date for OT Re-Evaluation 05/14/17   OT Start Time 0805   OT Stop Time 0917   OT Time Calculation (min) 72 min   Activity Tolerance Patient tolerated treatment well;No increased pain;Other (comment)  limited by body habitus   Behavior During Therapy [facility] for tasks assessed/performed      Past Medical History:  Diagnosis Date  . Cellulitis   . Cirrhosis, nonalcoholic ([place])   . GERD (gastroesophageal reflux disease)   . Headache    "maybe monthly" (12/19/2016)  . Hepatic encephalopathy ([place]) ~ 2016 X 2   "first time they thought it was a TIA; dx'd hepatic encephalopathy the 2nd time it happened"  . High cholesterol   . Hypertension   . Kidney stones 01/2015   UTI  . Migraine    "none in years; used to have them frequently" (12/19/2016)  . Obesity   . OSA on CPAP   . Pre-diabetes   . Snoring   . Venous stasis     Past Surgical History:  Procedure Laterality Date  . ESOPHAGOGASTRODUODENOSCOPY (EGD) WITH PROPOFOL N/A 07/26/2015   Procedure: ESOPHAGOGASTRODUODENOSCOPY (EGD) WITH PROPOFOL;  Surgeon: Arta Silence, MD;  [facility];  Service: Endoscopy;  Laterality: N/A;  . TEE WITHOUT CARDIOVERSION N/A 04/03/2015   Procedure: TRANSESOPHAGEAL ECHOCARDIOGRAM (TEE);  Surgeon: Adrian Prows, MD;  Location: [place];  Service: Cardiovascular;  Laterality: N/A;  . WISDOM TOOTH EXTRACTION  1990    There were no vitals filed for this visit.                            OT Education - 04/11/17 0958    Education provided Yes   Education  Details Cont LE skills training for LE self care throughout session   Person(s) Educated Patient   Methods Explanation;Demonstration   Comprehension Verbalized understanding             OT Long Term Goals - 04/04/17 1207      OT LONG TERM GOAL #1   Title Pt independent w/ lymphedema precautions and prevention principals and strategies to limit LE progression and infection risk.   Baseline dependent   Time 1   Period Weeks   Status Achieved     OT LONG TERM GOAL #2   Title Lymphedema (LE) management/ self-care: Pt able to apply multi layered, gradient compression wraps w Max caregiver assistance using proper techniques within 2 weeks to achieve optimal limb volume reductions bilaterally.   Baseline dependent   Time 2   Period Weeks   Status Achieved     OT LONG TERM GOAL #3   Title Lymphedema (LE) management/ self-care:  Pt to achieve at least 10% limb volume reductions bilaterally during Intensive CDT to limit LE progression, to reduce pain, and to improve safe ambulation and functional mobility.   Baseline in LLE   Time 12   Period Weeks   Status Partially Met     OT LONG TERM GOAL #4   Title Lymphedema (LE) management/ self-care:  Pt to tolerate daily compression wraps, garments and devices in keeping w/ prescribed wear regime within 1 week of issue date to progress and retain clinical and functional gains and to limit LE progression.   Baseline dependent   Time 12   Period Weeks   Status Partially Met     OT LONG TERM GOAL #5   Title Lymphedema (LE) management/ self-care:  During Management Phase CDT Pt to sustain current limb volumes within 5%, and all other clinical gains achieved during OT treatment with needed level of caregiver assistance to limit LE progression, infection risk and further functional decline.   Baseline dependent   Time 6   Period Months   Status Partially Met               Plan - 04/11/17 0959    Clinical Impression Statement RLE  response to CDT less dramatic than LLE; however, limb volume is slowly but steadily decreasing. Tissue density reduction is stubborn thus far. Pt compliance with home program with wife's assistance remains 100%. Excellent progress towards goals thus far. No difficulty tolerating skin care/ inspection, MLD or compression to BLE to date.    Rehab Potential Good   OT Frequency 3x / week   OT Duration 12 weeks   OT Treatment/Interventions Self-care/ADL training;DME and/or AE instruction;Manual lymph drainage;Patient/family education;Compression bandaging;Therapeutic exercises;Therapeutic activities;Therapeutic exercise;Energy conservation;Manual Therapy;Other (comment)  skin care   Consulted and Agree with Plan of Care Patient      Patient will benefit from skilled therapeutic intervention in order to improve the following deficits and impairments:  Decreased knowledge of use of DME, Decreased skin integrity, Increased edema, Impaired flexibility, Pain, Decreased mobility, Decreased scar mobility, Decreased endurance, Decreased range of motion, Decreased strength, Decreased balance, Decreased knowledge of precautions, Difficulty walking, Obesity  Visit Diagnosis: Lymphedema, not elsewhere classified    Problem List Patient Active Problem List   Diagnosis Date Noted  . Cellulitis of leg, left 01/16/2017  . Cellulitis of leg, right 12/23/2016  . AKI (acute kidney injury) ([place]) 12/19/2016  . Hyponatremia 12/19/2016  . Pressure injury of skin 12/19/2016  . Encephalopathy, hepatic ([place]) 11/20/2015  . Hepatic cirrhosis ([place]) 11/20/2015  . Thrombocytopenia ([place])   . Disorientation 11/19/2015  . Stroke or transient ischemic attack (TIA) diagnosed during current admission 11/19/2015  . OSA on CPAP 05/02/2015  . Hemorrhagic cystitis 01/29/2015  . Sepsis ([place]) 01/29/2015  . Flank pain 01/29/2015  . Sinus tachycardia 01/29/2015  . Lymphedema 01/29/2015  . Morbid obesity with body mass index of  50.0-59.9 in adult [facility]) 01/25/2015  . Obesity hypoventilation syndrome ([place]) 01/25/2015  . Snoring   . High cholesterol   . TIA (transient ischemic attack) 01/10/2015  . Morbid obesity ([place]) 01/10/2015  . Encephalopathy acute 01/09/2015  . HTN (hypertension) 01/09/2015  . Borderline diabetic 01/09/2015  . GERD (gastroesophageal reflux disease) 01/09/2015  . Acute encephalopathy     Andrey Spearman, MS, OTR/L, [facility] 04/11/17 10:02 AM   [place] MAIN [facility] SERVICES [address] [place], [place], [address] Phone: [phone]   Fax:  [phone]  Name: JD MCCASTER MRN: [id number] Date of Birth: March 28, 1971

## 2017-04-14 ENCOUNTER — Encounter: Payer: BLUE CROSS/BLUE SHIELD | Admitting: Occupational Therapy

## 2017-04-16 ENCOUNTER — Ambulatory Visit: Payer: BLUE CROSS/BLUE SHIELD | Admitting: Occupational Therapy

## 2017-04-16 ENCOUNTER — Encounter: Payer: BLUE CROSS/BLUE SHIELD | Admitting: Occupational Therapy

## 2017-04-16 DIAGNOSIS — I890 Lymphedema, not elsewhere classified: Secondary | ICD-10-CM

## 2017-04-16 NOTE — Therapy (Signed)
[place] MAIN [facility] SERVICES [address] [place], [place], [address] Phone: [phone]   Fax:  [phone]  Patient Details  Name: MEKHAI VENUTO MRN: [id number] Date of Birth: 07-08-1971 Referring Provider:  Christin Fudge, MD  Encounter Date: 04/16/2017  Andrey Spearman, MS, OTR/L, [facility] 04/16/17 5:16 PM   [place] MAIN [facility] SERVICES [address] [place], [place], [address] Phone: [phone]   Fax:  [phone]

## 2017-04-18 ENCOUNTER — Ambulatory Visit: Payer: BLUE CROSS/BLUE SHIELD | Admitting: Occupational Therapy

## 2017-04-18 DIAGNOSIS — I890 Lymphedema, not elsewhere classified: Secondary | ICD-10-CM | POA: Diagnosis not present

## 2017-04-18 NOTE — Therapy (Signed)
[place] MAIN [facility] SERVICES [address] [place], [place], [address] Phone: [phone]   Fax:  [phone]  Occupational Therapy Treatment  Patient Details  Name: Ryan Burgess MRN: [id number] Date of Birth: [birth year] Referring Provider: Christin Fudge, MD  Encounter Date: 04/18/2017      OT End of Session - 04/18/17 1609    Visit Number 25   Number of Visits 36   Date for OT Re-Evaluation 05/14/17   OT Start Time 0905   OT Stop Time 1005   OT Time Calculation (min) 60 min   Activity Tolerance Patient tolerated treatment well;No increased pain;Other (comment)  limited by body habitus   Behavior During Therapy [facility] for tasks assessed/performed      Past Medical History:  Diagnosis Date  . Cellulitis   . Cirrhosis, nonalcoholic ([place])   . GERD (gastroesophageal reflux disease)   . Headache    "maybe monthly" (12/19/2016)  . Hepatic encephalopathy ([place]) ~ 2016 X 2   "first time they thought it was a TIA; dx'd hepatic encephalopathy the 2nd time it happened"  . High cholesterol   . Hypertension   . Kidney stones 01/2015   UTI  . Migraine    "none in years; used to have them frequently" (12/19/2016)  . Obesity   . OSA on CPAP   . Pre-diabetes   . Snoring   . Venous stasis     Past Surgical History:  Procedure Laterality Date  . ESOPHAGOGASTRODUODENOSCOPY (EGD) WITH PROPOFOL N/A 07/26/2015   Procedure: ESOPHAGOGASTRODUODENOSCOPY (EGD) WITH PROPOFOL;  Surgeon: Arta Silence, MD;  Location: [facility];  Service: Endoscopy;  Laterality: N/A;  . TEE WITHOUT CARDIOVERSION N/A 04/03/2015   Procedure: TRANSESOPHAGEAL ECHOCARDIOGRAM (TEE);  Surgeon: Adrian Prows, MD;  Location: [place];  Service: Cardiovascular;  Laterality: N/A;  . WISDOM TOOTH EXTRACTION  1990    There were no vitals filed for this visit.      Subjective Assessment - 04/18/17 1608    Subjective  Pt returns for OT visit 234  36 for CDT to BLE w/ emphasis on  LLE  initially, then RLE. Pt has no new complaints. Pt presents with BLE compression wraps in place from foot to groin.  Pt has no new complaints today.   Pertinent History Recent episode of LE cellulitis and sepsis w/ hospitalization; hx TIA, DM, morbid obesity, OSA, depression; non-smoker   Limitations Difficulty walking, difficulty w/ functional mobility and transfers, decreased ability to perform basic and instrumental ADLs (LB dressing and bathing, fitting and donning shoes and socks, difficulty performing shoping and  all home management tasks requiring standing and walking) Difficulty performing productive and work activities, restricted social participation in the community   Repetition Increases Symptoms   Patient Stated Goals decrease leg swelling and keep it from getting worse so I can do more   Currently in Pain? No/denies   Pain Onset --  chronic                      OT Treatments/Exercises (OP) - 04/18/17 0001      ADLs   ADL Education Given Yes     Manual Therapy   Manual Therapy Edema management;Compression Bandaging   Manual Lymphatic Drainage (MLD) MLD to LLE as established   Compression Bandaging BLE thigh length compressin wrap applied as established   Other Manual Therapy skin care to BLE feet and legs below knees before re-wrapping  OT Education - 04/18/17 1609    Education provided Yes   Education Details Continued skilled Pt/caregiver education  And LE ADL training throughout visit for lymphedema self care/ home program, including compression wrapping, compression garment and device wear/care, lymphatic pumping ther ex, simple self-MLD, and skin care. Discussed progress towards goals.   Person(s) Educated Patient   Methods Explanation   Comprehension Verbalized understanding             OT Long Term Goals - 04/04/17 1207      OT LONG TERM GOAL #1   Title Pt independent w/ lymphedema precautions and prevention  principals and strategies to limit LE progression and infection risk.   Baseline dependent   Time 1   Period Weeks   Status Achieved     OT LONG TERM GOAL #2   Title Lymphedema (LE) management/ self-care: Pt able to apply multi layered, gradient compression wraps w Max caregiver assistance using proper techniques within 2 weeks to achieve optimal limb volume reductions bilaterally.   Baseline dependent   Time 2   Period Weeks   Status Achieved     OT LONG TERM GOAL #3   Title Lymphedema (LE) management/ self-care:  Pt to achieve at least 10% limb volume reductions bilaterally during Intensive CDT to limit LE progression, to reduce pain, and to improve safe ambulation and functional mobility.   Baseline in LLE   Time 12   Period Weeks   Status Partially Met     OT LONG TERM GOAL #4   Title Lymphedema (LE) management/ self-care:  Pt to tolerate daily compression wraps, garments and devices in keeping w/ prescribed wear regime within 1 week of issue date to progress and retain clinical and functional gains and to limit LE progression.   Baseline dependent   Time 12   Period Weeks   Status Partially Met     OT LONG TERM GOAL #5   Title Lymphedema (LE) management/ self-care:  During Management Phase CDT Pt to sustain current limb volumes within 5%, and all other clinical gains achieved during OT treatment with needed level of caregiver assistance to limit LE progression, infection risk and further functional decline.   Baseline dependent   Time 6   Period Months   Status Partially Met               Plan - 04/18/17 1609    Clinical Impression Statement Pt tolerated LLE skin care and MLD, and BLE compression wrapping today without difficulty. Pt continues to make steady progresstowards all LE goals, but was not reduced sufficiently to proceed w/  garment  measurements. Pt in agreement w/ plan to continue OT 3 x weekly until garment measurements are completed for optimal control  of swelling.   Rehab Potential Good   OT Frequency 3x / week   OT Duration 12 weeks   OT Treatment/Interventions Self-care/ADL training;DME and/or AE instruction;Manual lymph drainage;Patient/family education;Compression bandaging;Therapeutic exercises;Therapeutic activities;Therapeutic exercise;Energy conservation;Manual Therapy;Other (comment)  skin care   Consulted and Agree with Plan of Care Patient      Patient will benefit from skilled therapeutic intervention in order to improve the following deficits and impairments:  Decreased knowledge of use of DME, Decreased skin integrity, Increased edema, Impaired flexibility, Pain, Decreased mobility, Decreased scar mobility, Decreased endurance, Decreased range of motion, Decreased strength, Decreased balance, Decreased knowledge of precautions, Difficulty walking, Obesity  Visit Diagnosis: Lymphedema, not elsewhere classified    Problem List Patient Active Problem List  Diagnosis Date Noted  . Cellulitis of leg, left 01/16/2017  . Cellulitis of leg, right 12/23/2016  . AKI (acute kidney injury) ([place]) 12/19/2016  . Hyponatremia 12/19/2016  . Pressure injury of skin 12/19/2016  . Encephalopathy, hepatic ([place]) 11/20/2015  . Hepatic cirrhosis ([place]) 11/20/2015  . Thrombocytopenia ([place])   . Disorientation 11/19/2015  . Stroke or transient ischemic attack (TIA) diagnosed during current admission 11/19/2015  . OSA on CPAP 05/02/2015  . Hemorrhagic cystitis 01/29/2015  . Sepsis ([place]) 01/29/2015  . Flank pain 01/29/2015  . Sinus tachycardia 01/29/2015  . Lymphedema 01/29/2015  . Morbid obesity with body mass index of 50.0-59.9 in adult [facility]) 01/25/2015  . Obesity hypoventilation syndrome ([place]) 01/25/2015  . Snoring   . High cholesterol   . TIA (transient ischemic attack) 01/10/2015  . Morbid obesity ([place]) 01/10/2015  . Encephalopathy acute 01/09/2015  . HTN (hypertension) 01/09/2015  . Borderline diabetic 01/09/2015  . GERD  (gastroesophageal reflux disease) 01/09/2015  . Acute encephalopathy     Andrey Spearman, MS, OTR/L, [facility] 04/18/17 4:13 PM  [place] MAIN [facility] SERVICES [address] [place], [place], [address] Phone: [phone]   Fax:  [phone]  Name: NIKOLAI WILCZAK MRN: [id number] Date of Birth: 12/10/1970

## 2017-04-21 ENCOUNTER — Encounter: Payer: BLUE CROSS/BLUE SHIELD | Admitting: Occupational Therapy

## 2017-04-21 ENCOUNTER — Ambulatory Visit: Payer: BLUE CROSS/BLUE SHIELD | Admitting: Occupational Therapy

## 2017-04-21 DIAGNOSIS — I890 Lymphedema, not elsewhere classified: Secondary | ICD-10-CM | POA: Diagnosis not present

## 2017-04-21 NOTE — Therapy (Signed)
[place] MAIN [facility] SERVICES [address] [place], [place], [address] Phone: [phone]   Fax:  [phone]  Occupational Therapy Treatment  Patient Details  Name: Ryan Burgess MRN: [id number] Date of Birth: Jun 08, 1971 Referring Provider: Christin Fudge, MD  Encounter Date: 04/21/2017      OT End of Session - 04/21/17 1623    Visit Number 26   Number of Visits 36   Date for OT Re-Evaluation 05/14/17   OT Start Time 0215   OT Stop Time 0327   OT Time Calculation (min) 72 min   Activity Tolerance Patient tolerated treatment well;No increased pain;Other (comment)  limited by body habitus   Behavior During Therapy [facility] for tasks assessed/performed      Past Medical History:  Diagnosis Date  . Cellulitis   . Cirrhosis, nonalcoholic ([place])   . GERD (gastroesophageal reflux disease)   . Headache    "maybe monthly" (12/19/2016)  . Hepatic encephalopathy ([place]) ~ 2016 X 2   "first time they thought it was a TIA; dx'd hepatic encephalopathy the 2nd time it happened"  . High cholesterol   . Hypertension   . Kidney stones 01/2015   UTI  . Migraine    "none in years; used to have them frequently" (12/19/2016)  . Obesity   . OSA on CPAP   . Pre-diabetes   . Snoring   . Venous stasis     Past Surgical History:  Procedure Laterality Date  . ESOPHAGOGASTRODUODENOSCOPY (EGD) WITH PROPOFOL N/A 07/26/2015   Procedure: ESOPHAGOGASTRODUODENOSCOPY (EGD) WITH PROPOFOL;  Surgeon: Arta Silence, MD;  [facility];  Service: Endoscopy;  Laterality: N/A;  . TEE WITHOUT CARDIOVERSION N/A 04/03/2015   Procedure: TRANSESOPHAGEAL ECHOCARDIOGRAM (TEE);  Surgeon: Adrian Prows, MD;  Location: [place];  Service: Cardiovascular;  Laterality: N/A;  . WISDOM TOOTH EXTRACTION  1990    There were no vitals filed for this visit.                                 OT Long Term Goals - 04/04/17 1207      OT LONG TERM GOAL #1    Title Pt independent w/ lymphedema precautions and prevention principals and strategies to limit LE progression and infection risk.   Baseline dependent   Time 1   Period Weeks   Status Achieved     OT LONG TERM GOAL #2   Title Lymphedema (LE) management/ self-care: Pt able to apply multi layered, gradient compression wraps w Max caregiver assistance using proper techniques within 2 weeks to achieve optimal limb volume reductions bilaterally.   Baseline dependent   Time 2   Period Weeks   Status Achieved     OT LONG TERM GOAL #3   Title Lymphedema (LE) management/ self-care:  Pt to achieve at least 10% limb volume reductions bilaterally during Intensive CDT to limit LE progression, to reduce pain, and to improve safe ambulation and functional mobility.   Baseline in LLE   Time 12   Period Weeks   Status Partially Met     OT LONG TERM GOAL #4   Title Lymphedema (LE) management/ self-care:  Pt to tolerate daily compression wraps, garments and devices in keeping w/ prescribed wear regime within 1 week of issue date to progress and retain clinical and functional gains and to limit LE progression.   Baseline dependent   Time 12   Period  Weeks   Status Partially Met     OT LONG TERM GOAL #5   Title Lymphedema (LE) management/ self-care:  During Management Phase CDT Pt to sustain current limb volumes within 5%, and all other clinical gains achieved during OT treatment with needed level of caregiver assistance to limit LE progression, infection risk and further functional decline.   Baseline dependent   Time 6   Period Months   Status Partially Met               Plan - 04/21/17 1624    Clinical Impression Statement Pt continues to demonstrate progress towards all goals . He remains very diligent w/ LE selff care home program with max assistance from his spouse. Limb reduction continues to fluctuate proximally in BLE, but legs below the knee are somewhat stable with very good  reductions thus far. Pt tolerating all aspects of manual therapy today and is in agreement with plan to complete RLE garment and device measurements on 5/29 with manufacturer's rep as planned.    Rehab Potential Good   OT Frequency 3x / week   OT Duration 12 weeks   OT Treatment/Interventions Self-care/ADL training;DME and/or AE instruction;Manual lymph drainage;Patient/family education;Compression bandaging;Therapeutic exercises;Therapeutic activities;Therapeutic exercise;Energy conservation;Manual Therapy;Other (comment)  skin care   Consulted and Agree with Plan of Care Patient      Patient will benefit from skilled therapeutic intervention in order to improve the following deficits and impairments:  Decreased knowledge of use of DME, Decreased skin integrity, Increased edema, Impaired flexibility, Pain, Decreased mobility, Decreased scar mobility, Decreased endurance, Decreased range of motion, Decreased strength, Decreased balance, Decreased knowledge of precautions, Difficulty walking, Obesity  Visit Diagnosis: Lymphedema, not elsewhere classified    Problem List Patient Active Problem List   Diagnosis Date Noted  . Cellulitis of leg, left 01/16/2017  . Cellulitis of leg, right 12/23/2016  . AKI (acute kidney injury) ([place]) 12/19/2016  . Hyponatremia 12/19/2016  . Pressure injury of skin 12/19/2016  . Encephalopathy, hepatic ([place]) 11/20/2015  . Hepatic cirrhosis ([place]) 11/20/2015  . Thrombocytopenia ([place])   . Disorientation 11/19/2015  . Stroke or transient ischemic attack (TIA) diagnosed during current admission 11/19/2015  . OSA on CPAP 05/02/2015  . Hemorrhagic cystitis 01/29/2015  . Sepsis ([place]) 01/29/2015  . Flank pain 01/29/2015  . Sinus tachycardia 01/29/2015  . Lymphedema 01/29/2015  . Morbid obesity with body mass index of 50.0-59.9 in adult [facility]) 01/25/2015  . Obesity hypoventilation syndrome ([place]) 01/25/2015  . Snoring   . High cholesterol   . TIA (transient  ischemic attack) 01/10/2015  . Morbid obesity ([place]) 01/10/2015  . Encephalopathy acute 01/09/2015  . HTN (hypertension) 01/09/2015  . Borderline diabetic 01/09/2015  . GERD (gastroesophageal reflux disease) 01/09/2015  . Acute encephalopathy     Andrey Spearman, MS, OTR/L, [facility] 04/21/17 4:29 PM  [place] MAIN [facility] SERVICES [address] [place], [place], [address] Phone: [phone]   Fax:  [phone]  Name: Ryan Burgess MRN: [id number] Date of Birth: 01-19-1971

## 2017-04-22 ENCOUNTER — Ambulatory Visit (INDEPENDENT_AMBULATORY_CARE_PROVIDER_SITE_OTHER): Payer: BLUE CROSS/BLUE SHIELD | Admitting: Adult Health

## 2017-04-22 ENCOUNTER — Ambulatory Visit: Payer: BLUE CROSS/BLUE SHIELD | Admitting: Occupational Therapy

## 2017-04-22 ENCOUNTER — Encounter: Payer: Self-pay | Admitting: Adult Health

## 2017-04-22 VITALS — BP 149/72 | HR 80 | Ht 76.0 in | Wt >= 6400 oz

## 2017-04-22 DIAGNOSIS — I10 Essential (primary) hypertension: Secondary | ICD-10-CM

## 2017-04-22 DIAGNOSIS — K7469 Other cirrhosis of liver: Secondary | ICD-10-CM | POA: Diagnosis not present

## 2017-04-22 DIAGNOSIS — L03115 Cellulitis of right lower limb: Secondary | ICD-10-CM

## 2017-04-22 DIAGNOSIS — R7303 Prediabetes: Secondary | ICD-10-CM | POA: Diagnosis not present

## 2017-04-22 DIAGNOSIS — L03116 Cellulitis of left lower limb: Secondary | ICD-10-CM | POA: Diagnosis not present

## 2017-04-22 DIAGNOSIS — K219 Gastro-esophageal reflux disease without esophagitis: Secondary | ICD-10-CM | POA: Diagnosis not present

## 2017-04-22 LAB — POCT GLYCOSYLATED HEMOGLOBIN (HGB A1C): HEMOGLOBIN A1C: 4.9

## 2017-04-22 LAB — POCT UA - MICROALBUMIN
Albumin/Creatinine Ratio, Urine, POC: <30
CREATININE, POC: 200 mg/dL
MICROALBUMIN (UR) POC: 10 mg/L

## 2017-04-22 NOTE — Assessment & Plan Note (Signed)
Continue with wound care as directed. Elevate legs as often as possible.

## 2017-04-22 NOTE — Patient Instructions (Addendum)
Heart-Healthy Eating Plan Many factors influence your heart health, including eating and exercise habits. Heart (coronary) risk increases with abnormal blood fat (lipid) levels. Heart-healthy meal planning includes limiting unhealthy fats, increasing healthy fats, and making other small dietary changes. This includes maintaining a healthy body weight to help keep lipid levels within a normal range. What is my plan? Your health care provider recommends that you:  Get no more than _________% of the total calories in your daily diet from fat.  Limit your intake of saturated fat to less than _________% of your total calories each day.  Limit the amount of cholesterol in your diet to less than _________ mg per day. What types of fat should I choose?  Choose healthy fats more often. Choose monounsaturated and polyunsaturated fats, such as olive oil and canola oil, flaxseeds, walnuts, almonds, and seeds.  Eat more omega-3 fats. Good choices include salmon, mackerel, sardines, tuna, flaxseed oil, and ground flaxseeds. Aim to eat fish at least two times each week.  Limit saturated fats. Saturated fats are primarily found in animal products, such as meats, butter, and cream. Plant sources of saturated fats include palm oil, palm kernel oil, and coconut oil.  Avoid foods with partially hydrogenated oils in them. These contain trans fats. Examples of foods that contain trans fats are stick margarine, some tub margarines, cookies, crackers, and other baked goods. What general guidelines do I need to follow?  Check food labels carefully to identify foods with trans fats or high amounts of saturated fat.  Fill one half of your plate with vegetables and green salads. Eat 4-5 servings of vegetables per day. A serving of vegetables equals 1 cup of raw leafy vegetables,  cup of raw or cooked cut-up vegetables, or  cup of vegetable juice.  Fill one fourth of your plate with whole grains. Look for the word  "whole" as the first word in the ingredient list.  Fill one fourth of your plate with lean protein foods.  Eat 4-5 servings of fruit per day. A serving of fruit equals one medium whole fruit,  cup of dried fruit,  cup of fresh, frozen, or canned fruit, or  cup of 100% fruit juice.  Eat more foods that contain soluble fiber. Examples of foods that contain this type of fiber are apples, broccoli, carrots, beans, peas, and barley. Aim to get 20-30 g of fiber per day.  Eat more home-cooked food and less restaurant, buffet, and fast food.  Limit or avoid alcohol.  Limit foods that are high in starch and sugar.  Avoid fried foods.  Cook foods by using methods other than frying. Baking, boiling, grilling, and broiling are all great options. Other fat-reducing suggestions include:  Removing the skin from poultry.  Removing all visible fats from meats.  Skimming the fat off of stews, soups, and gravies before serving them.  Steaming vegetables in water or broth.  Lose weight if you are overweight. Losing just 5-10% of your initial body weight can help your overall health and prevent diseases such as diabetes and heart disease.  Increase your consumption of nuts, legumes, and seeds to 4-5 servings per week. One serving of dried beans or legumes equals  cup after being cooked, one serving of nuts equals 1 ounces, and one serving of seeds equals  ounce or 1 tablespoon.  You may need to monitor your salt (sodium) intake, especially if you have high blood pressure. Talk with your health care provider or dietitian to get more  information about reducing sodium. What foods can I eat? Grains   Breads, including [place], white, pita, wheat, raisin, rye, oatmeal, and [place]. Tortillas that are neither fried nor made with lard or trans fat. Low-fat rolls, including hotdog and hamburger buns and English muffins. Biscuits. Muffins. Waffles. Pancakes. Light popcorn. Whole-grain cereals. Flatbread.  Melba toast. Pretzels. Breadsticks. Rusks. Low-fat snacks and crackers, including oyster, saltine, matzo, graham, animal, and rye. Rice and pasta, including [place] rice and those that are made with whole wheat. Vegetables  All vegetables. Fruits  All fruits, but limit coconut. Meats and Other Protein Sources  Lean, well-trimmed beef, veal, pork, and lamb. Chicken and [place] without skin. All fish and shellfish. Wild duck, rabbit, pheasant, and venison. Egg whites or low-cholesterol egg substitutes. Dried beans, peas, lentils, and tofu.Seeds and most nuts. Dairy  Low-fat or nonfat cheeses, including ricotta, string, and mozzarella. Skim or 1% milk that is liquid, powdered, or evaporated. Buttermilk that is made with low-fat milk. Nonfat or low-fat yogurt. Beverages  Mineral water. Diet carbonated beverages. Sweets and Desserts  Sherbets and fruit ices. Honey, jam, marmalade, jelly, and syrups. Meringues and gelatins. Pure sugar candy, such as hard candy, jelly beans, gumdrops, mints, marshmallows, and small amounts of dark chocolate. [employer] Eat all sweets and desserts in moderation. Fats and Oils  Nonhydrogenated (trans-free) margarines. Vegetable oils, including soybean, sesame, sunflower, olive, peanut, safflower, corn, canola, and cottonseed. Salad dressings or mayonnaise that are made with a vegetable oil. Limit added fats and oils that you use for cooking, baking, salads, and as spreads. Other  Cocoa powder. Coffee and tea. All seasonings and condiments. The items listed above may not be a complete list of recommended foods or beverages. Contact your dietitian for more options.  What foods are not recommended? Grains  Breads that are made with saturated or trans fats, oils, or whole milk. Croissants. Butter rolls. Cheese breads. Sweet rolls. Donuts. Buttered popcorn. Chow mein noodles. High-fat crackers, such as cheese or butter crackers. Meats and Other Protein Sources  Fatty  meats, such as hotdogs, short ribs, sausage, spareribs, bacon, ribeye roast or steak, and mutton. High-fat deli meats, such as salami and bologna. Caviar. Domestic duck and goose. Organ meats, such as kidney, liver, sweetbreads, brains, gizzard, chitterlings, and heart. Dairy  Cream, sour cream, cream cheese, and creamed cottage cheese. Whole milk cheeses, including blue (bleu), Monte[place] Jack, [place], [place], American, [place], Swiss, [place], [place], and [place]. Whole or 2% milk that is liquid, evaporated, or condensed. Whole buttermilk. Cream sauce or high-fat cheese sauce. Yogurt that is made from whole milk. Beverages  Regular sodas and drinks with added sugar. Sweets and Desserts  Frosting. Pudding. Cookies. Cakes other than angel food cake. Candy that has milk chocolate or white chocolate, hydrogenated fat, butter, coconut, or unknown ingredients. Buttered syrups. Full-fat ice cream or ice cream drinks. Fats and Oils  Gravy that has suet, meat fat, or shortening. Cocoa butter, hydrogenated oils, palm oil, coconut oil, palm kernel oil. These can often be found in baked products, candy, fried foods, nondairy creamers, and whipped toppings. Solid fats and shortenings, including bacon fat, salt pork, lard, and butter. Nondairy cream substitutes, such as coffee creamers and sour cream substitutes. Salad dressings that are made of unknown oils, cheese, or sour cream. The items listed above may not be a complete list of foods and beverages to avoid. Contact your dietitian for more information.  This information is not intended to replace advice given to you by  your health care provider. Make sure you discuss any questions you have with your health care provider. Document Released: 08/27/2008 Document Revised: 06/07/2016 Document Reviewed: 05/12/2014 Elsevier Interactive Patient Education  2017 [employer].   Exercising to [employer] Exercising can help you to lose weight. In order to lose weight  through exercise, you need to do vigorous-intensity exercise. You can tell that you are exercising with vigorous intensity if you are breathing very hard and fast and cannot hold a conversation while exercising. Moderate-intensity exercise helps to maintain your current weight. You can tell that you are exercising at a moderate level if you have a higher heart rate and faster breathing, but you are still able to hold a conversation. How often should I exercise? Choose an activity that you enjoy and set realistic goals. Your health care provider can help you to make an activity plan that works for you. Exercise regularly as directed by your health care provider. This may include:  Doing resistance training twice each week, such as:  Push-ups.  Sit-ups.  Lifting weights.  Using resistance bands.  Doing a given intensity of exercise for a given amount of time. Choose from these options:  150 minutes of moderate-intensity exercise every week.  75 minutes of vigorous-intensity exercise every week.  A mix of moderate-intensity and vigorous-intensity exercise every week. Children, pregnant women, people who are out of shape, people who are overweight, and older adults may need to consult a health care provider for individual recommendations. If you have any sort of medical condition, be sure to consult your health care provider before starting a new exercise program. What are some activities that can help me to lose weight?  Walking at a rate of at least 4.5 miles an hour.  Jogging or running at a rate of 5 miles per hour.  Biking at a rate of at least 10 miles per hour.  Lap swimming.  Roller-skating or in-line skating.  Cross-country skiing.  Vigorous competitive sports, such as football, basketball, and soccer.  Jumping rope.  Aerobic dancing. How can I be more active in my day-to-day activities?  Use the stairs instead of the elevator.  Take a walk during your lunch  break.  If you drive, park your car farther away from work or school.  If you take public transportation, get off one stop early and walk the rest of the way.  Make all of your phone calls while standing up and walking around.  Get up, stretch, and walk around every 30 minutes throughout the day. What guidelines should I follow while exercising?  Do not exercise so much that you hurt yourself, feel dizzy, or get very short of breath.  Consult your health care provider prior to starting a new exercise program.  Wear comfortable clothes and shoes with good support.  Drink plenty of water while you exercise to prevent dehydration or heat stroke. Body water is lost during exercise and must be replaced.  Work out until you breathe faster and your heart beats faster. This information is not intended to replace advice given to you by your health care provider. Make sure you discuss any questions you have with your health care provider. Document Released: 12/21/2010 Document Revised: 04/25/2016 Document Reviewed: 04/21/2014 Elsevier Interactive Patient Education  2017 [employer].  Continue medications as directed. Increase water intake to at least gallon/day. Follow heart healthy diet. Continue follow-up GI ang wound care. A1c Normal. Please follow-up in 6 months. Please call with any questions/concerns.

## 2017-04-22 NOTE — Assessment & Plan Note (Signed)
Continue dexilant. Followed by GI every 6 months.

## 2017-04-22 NOTE — Assessment & Plan Note (Signed)
A1c today 4.9.

## 2017-04-22 NOTE — Progress Notes (Signed)
Subjective:    Patient ID: Ryan Burgess, male    DOB: 20-Mar-1971, 46 y.o.   MRN: [id number]  HPI:  Ryan Burgess is here for CPE.  He is compliant on all medications and denies SE.  He is followed by GI (Encephalopathy, hepatic cirrhosis), and ID (bil lower ext cellulitis).  He has lost 20 lbs since last OV, mostly r/t fluid reduction of lower extremities (he estimates edema has reduced > 30% since onset of acute exacerbation).  He reports anxiety/depression has improved on Sertraline '50mg'$  daily.  He denies thoughts of harming himself/others.  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  He is very vague about his diet and feels that his goal wt is around 300 lbs.  He continues to struggle walking even short distances.   Patient Care Team    Relationship Specialty Notifications Start End  Lillard Anes D, NP PCP - General Family Medicine  01/16/17   Christin Fudge, MD Consulting Physician Surgery  01/16/17   Arta Silence, MD Consulting Physician Gastroenterology  01/16/17     Patient Active Problem List   Diagnosis Date Noted  . Cellulitis of leg, left 01/16/2017  . Cellulitis of leg, right 12/23/2016  . AKI (acute kidney injury) ([place]) 12/19/2016  . Hyponatremia 12/19/2016  . Pressure injury of skin 12/19/2016  . Encephalopathy, hepatic ([place]) 11/20/2015  . Hepatic cirrhosis ([place]) 11/20/2015  . Thrombocytopenia ([place])   . Disorientation 11/19/2015  . Stroke or transient ischemic attack (TIA) diagnosed during current admission 11/19/2015  . OSA on CPAP 05/02/2015  . Hemorrhagic cystitis 01/29/2015  . Sepsis ([place]) 01/29/2015  . Flank pain 01/29/2015  . Sinus tachycardia 01/29/2015  . Lymphedema 01/29/2015  . Morbid obesity with body mass index of 50.0-59.9 in adult [facility]) 01/25/2015  . Obesity hypoventilation syndrome ([place]) 01/25/2015  . Snoring   . High cholesterol   . TIA (transient ischemic attack) 01/10/2015  . Morbid obesity ([place]) 01/10/2015  .  Encephalopathy acute 01/09/2015  . HTN (hypertension) 01/09/2015  . Borderline diabetic 01/09/2015  . GERD (gastroesophageal reflux disease) 01/09/2015  . Acute encephalopathy      Past Medical History:  Diagnosis Date  . Cellulitis   . Cirrhosis, nonalcoholic ([place])   . GERD (gastroesophageal reflux disease)   . Headache    "maybe monthly" (12/19/2016)  . Hepatic encephalopathy ([place]) ~ 2016 X 2   "first time they thought it was a TIA; dx'd hepatic encephalopathy the 2nd time it happened"  . High cholesterol   . Hypertension   . Kidney stones 01/2015   UTI  . Migraine    "none in years; used to have them frequently" (12/19/2016)  . Obesity   . OSA on CPAP   . Pre-diabetes   . Snoring   . Venous stasis      Past Surgical History:  Procedure Laterality Date  . ESOPHAGOGASTRODUODENOSCOPY (EGD) WITH PROPOFOL N/A 07/26/2015   Procedure: ESOPHAGOGASTRODUODENOSCOPY (EGD) WITH PROPOFOL;  Surgeon: Arta Silence, MD;  Location: [facility] ENDOSCOPY;  Service: Endoscopy;  Laterality: N/A;  . TEE WITHOUT CARDIOVERSION N/A 04/03/2015   Procedure: TRANSESOPHAGEAL ECHOCARDIOGRAM (TEE);  Surgeon: Adrian Prows, MD;  Location[facility] ENDOSCOPY;  Service: Cardiovascular;  Laterality: N/A;  . WISDOM TOOTH EXTRACTION  1990     Family History  Problem Relation Age of Onset  . Leukemia Mother   . Diabetes Father   . Heart disease Maternal Grandmother   . Heart disease Paternal Grandmother   . Dementia  Paternal Grandmother   . Diabetes Paternal Grandmother   . Frontotemporal dementia Paternal Grandmother   . COPD Maternal Grandfather   . Heart disease Paternal Grandfather   . Diabetes Paternal Grandfather      History  Drug Use No     History  Alcohol Use  . 0.6 - 1.2 oz/week  . 1 - 2 Standard drinks or equivalent per week    Comment: once a year     History  Smoking Status  . Never Smoker  Smokeless Tobacco  . Never Used     Outpatient Encounter Prescriptions as of 04/22/2017   Medication Sig  . acetaminophen (TYLENOL) 500 MG tablet Take 1,000 mg by mouth every 6 (six) hours as needed for headache.  Marland Kitchen aspirin EC 81 MG EC tablet Take 1 tablet (81 mg total) by mouth daily.  Marland Kitchen atorvastatin (LIPITOR) 20 MG tablet Take 1 tablet (20 mg total) by mouth at bedtime.  . blood glucose meter kit and supplies KIT Dispense based on patient and insurance preference. Use up to four times daily as directed. (FOR ICD-9 250.00, 250.01).  Marland Kitchen dexlansoprazole (DEXILANT) 60 MG capsule Take 60 mg by mouth daily.  . furosemide (LASIX) 40 MG tablet Take 1 tablet (40 mg total) by mouth daily.  Marland Kitchen lactulose, encephalopathy, (CHRONULAC) 10 GM/15ML SOLN Take 45 mls by mouth 3 times a day  . loratadine (CLARITIN) 10 MG tablet Take 10 mg by mouth daily as needed for allergies.  . Multiple Vitamin (MULTIVITAMIN WITH MINERALS) TABS tablet Take 1 tablet by mouth daily.  . rifaximin (XIFAXAN) 550 MG TABS tablet Take 1 tablet (550 mg total) by mouth 2 (two) times daily.  Marland Kitchen saccharomyces boulardii (FLORASTOR) 250 MG capsule Take 1 capsule (250 mg total) by mouth 2 (two) times daily.  . sertraline (ZOLOFT) 50 MG tablet Take 1 tablet by mouth daily.  Marland Kitchen Silver (AQUACEL AG FOAM) 4"X4" PADS Apply 1 Units topically daily.  . Vitamin D, Ergocalciferol, (DRISDOL) 50000 units CAPS capsule Take 1 capsule (50,000 Units total) by mouth every 7 (seven) days.   No facility-administered encounter medications on file as of 04/22/2017.     Allergies: Patient has no known allergies.  Body mass index is 59.41 kg/m.  Blood pressure (!) 149/72, pulse 80, height '6\' 4"'$  (1.93 m), weight (!) 488 lb 1.6 oz (221.4 kg).     Review of Systems  Constitutional: Positive for activity change and fatigue. Negative for appetite change, chills and diaphoresis.  Respiratory: Negative for cough, chest tightness, shortness of breath, wheezing and stridor.   Cardiovascular: Positive for leg swelling. Negative for chest pain and  palpitations.  Musculoskeletal: Positive for arthralgias, back pain, gait problem, joint swelling and myalgias.  Skin: Negative for color change, pallor, rash and wound.       Objective:   Physical Exam  Constitutional: He is oriented to person, place, and time. He appears well-developed and well-nourished. No distress.  HENT:  Head: Normocephalic and atraumatic.  Eyes: Conjunctivae are normal. Pupils are equal, round, and reactive to light.  Neck: Normal range of motion.  Cardiovascular: Normal rate, normal heart sounds and intact distal pulses.   Pulmonary/Chest: Effort normal and breath sounds normal. No respiratory distress. He has no wheezes. He has no rales. He exhibits no tenderness.  Abdominal: Soft. Bowel sounds are normal. He exhibits no distension and no mass. There is no tenderness. There is no rebound, no guarding and no CVA tenderness. Hernia confirmed negative in the right  inguinal area and confirmed negative in the left inguinal area.  Genitourinary: Rectum normal, prostate normal, testes normal and penis normal. Rectal exam shows guaiac negative stool. No penile tenderness.  Genitourinary Comments: Molluscum Contagiosum noted around anus and inner thighs.  Musculoskeletal: He exhibits edema and tenderness.       Right knee: He exhibits decreased range of motion and swelling.       Left knee: He exhibits decreased range of motion and swelling.       Right ankle: He exhibits decreased range of motion and swelling. He exhibits normal pulse.       Left ankle: He exhibits decreased range of motion. He exhibits normal pulse.       Right lower leg: He exhibits tenderness, swelling and edema.       Left lower leg: He exhibits tenderness, swelling and edema.       Right foot: There is decreased range of motion and swelling.       Left foot: There is decreased range of motion and swelling.  Substantial edema of lower extremity from knees to toes. From mid shin to ankle-very  reddened, warm, thickened tissue. No open tissue or active drainage. Pedal pulses palpable but VERY difficult to feel due to edema. Pitting edema on tops of feet.  Lymphadenopathy:    He has no cervical adenopathy.       Right: No inguinal adenopathy present.       Left: No inguinal adenopathy present.  Neurological: He is alert and oriented to person, place, and time. Coordination normal.  Skin: Skin is warm and dry. He is not diaphoretic.  Psychiatric: He has a normal mood and affect. His behavior is normal. Judgment and thought content normal.  Nursing note and vitals reviewed.         Assessment & Plan:   1. Borderline diabetic   2. Essential hypertension   3. Gastroesophageal reflux disease without esophagitis   4. Other cirrhosis of liver ([place])   5. Cellulitis of leg, right   6. Cellulitis of leg, left     HTN (hypertension) Continue Furosemide '40mg'$  daily. Follow Heart Healthy Diet.  GERD (gastroesophageal reflux disease) Continue dexilant. Followed by GI every 6 months.  Hepatic cirrhosis ([facility]) Followed by GI every 6 months. He denies ETOH or Acetaminophen use.  Borderline diabetic A1c today 4.9.   Cellulitis of leg, right Continue with wound care as directed. Elevate legs as often as possible.  Cellulitis of leg, left Continue with wound care as directed. Elevate legs as often as possible.    FOLLOW-UP:  Return for Regular Follow Up.

## 2017-04-22 NOTE — Assessment & Plan Note (Signed)
Followed by GI every 6 months. He denies ETOH or Acetaminophen use.

## 2017-04-22 NOTE — Assessment & Plan Note (Signed)
Continue Furosemide 40mg  daily. Follow Heart Healthy Diet.

## 2017-04-23 ENCOUNTER — Ambulatory Visit: Payer: BLUE CROSS/BLUE SHIELD | Admitting: Occupational Therapy

## 2017-04-23 DIAGNOSIS — I890 Lymphedema, not elsewhere classified: Secondary | ICD-10-CM | POA: Diagnosis not present

## 2017-04-23 NOTE — Therapy (Signed)
[place] MAIN [facility] SERVICES [address] [place], [place], [address] Phone: [phone]   Fax:  [phone]  Occupational Therapy Treatment  Patient Details  Name: Ryan Burgess MRN: [id number] Date of Birth: 1971/10/30 Referring Provider: Christin Fudge, MD  Encounter Date: 04/23/2017      OT End of Session - 04/23/17 1219    Visit Number 27   Number of Visits 36   Date for OT Re-Evaluation 05/14/17   OT Start Time 0805   OT Stop Time 0845   OT Time Calculation (min) 40 min   Activity Tolerance Patient tolerated treatment well;No increased pain;Other (comment)  limited by body habitus   Behavior During Therapy [facility] for tasks assessed/performed      Past Medical History:  Diagnosis Date  . Cellulitis   . Cirrhosis, nonalcoholic ([place])   . GERD (gastroesophageal reflux disease)   . Headache    "maybe monthly" (12/19/2016)  . Hepatic encephalopathy ([place]) ~ 2016 X 2   "first time they thought it was a TIA; dx'd hepatic encephalopathy the 2nd time it happened"  . High cholesterol   . Hypertension   . Kidney stones 01/2015   UTI  . Migraine    "none in years; used to have them frequently" (12/19/2016)  . Obesity   . OSA on CPAP   . Pre-diabetes   . Snoring   . Venous stasis     Past Surgical History:  Procedure Laterality Date  . ESOPHAGOGASTRODUODENOSCOPY (EGD) WITH PROPOFOL N/A 07/26/2015   Procedure: ESOPHAGOGASTRODUODENOSCOPY (EGD) WITH PROPOFOL;  Surgeon: Arta Silence, MD;  Location: [facility];  Service: Endoscopy;  Laterality: N/A;  . TEE WITHOUT CARDIOVERSION N/A 04/03/2015   Procedure: TRANSESOPHAGEAL ECHOCARDIOGRAM (TEE);  Surgeon: Adrian Prows, MD;  Location: [place];  Service: Cardiovascular;  Laterality: N/A;  . WISDOM TOOTH EXTRACTION  1990    There were no vitals filed for this visit.      Subjective Assessment - 04/23/17 1218    Subjective  Pt returns for OT visit 27 for CDT to BLE w/ emphasis on LLE   initially, then RLE. Pt has no new complaints. Pt presents with BLE compression wraps in place from foot to groin.  Pt has no new complaints today.   Pertinent History Recent episode of LE cellulitis and sepsis w/ hospitalization; hx TIA, DM, morbid obesity, OSA, depression; non-smoker   Limitations Difficulty walking, difficulty w/ functional mobility and transfers, decreased ability to perform basic and instrumental ADLs (LB dressing and bathing, fitting and donning shoes and socks, difficulty performing shoping and  all home management tasks requiring standing and walking) Difficulty performing productive and work activities, restricted social participation in the community   Repetition Increases Symptoms   Patient Stated Goals decrease leg swelling and keep it from getting worse so I can do more   Currently in Pain? Yes  not numerically rated   Pain Onset --  chronic                      OT Treatments/Exercises (OP) - 04/23/17 0001      ADLs   ADL Education Given Yes     Manual Therapy   Manual Therapy Edema management;Compression Bandaging   Manual Lymphatic Drainage (MLD) MLD to LLE as established   Compression Bandaging BLE thigh length compressin wrap applied as established   Other Manual Therapy skin care to BLE feet and legs below knees before re-wrapping  OT Education - 04/23/17 1219    Education provided Yes   Education Details Continued skilled Pt/caregiver education  And LE ADL training throughout visit for lymphedema self care/ home program, including compression wrapping, compression garment and device wear/care, lymphatic pumping ther ex, simple self-MLD, and skin care. Discussed progress towards goals.   Person(s) Educated Patient   Methods Explanation;Demonstration   Comprehension Verbalized understanding             OT Long Term Goals - 04/04/17 1207      OT LONG TERM GOAL #1   Title Pt independent w/ lymphedema  precautions and prevention principals and strategies to limit LE progression and infection risk.   Baseline dependent   Time 1   Period Weeks   Status Achieved     OT LONG TERM GOAL #2   Title Lymphedema (LE) management/ self-care: Pt able to apply multi layered, gradient compression wraps w Max caregiver assistance using proper techniques within 2 weeks to achieve optimal limb volume reductions bilaterally.   Baseline dependent   Time 2   Period Weeks   Status Achieved     OT LONG TERM GOAL #3   Title Lymphedema (LE) management/ self-care:  Pt to achieve at least 10% limb volume reductions bilaterally during Intensive CDT to limit LE progression, to reduce pain, and to improve safe ambulation and functional mobility.   Baseline in LLE   Time 12   Period Weeks   Status Partially Met     OT LONG TERM GOAL #4   Title Lymphedema (LE) management/ self-care:  Pt to tolerate daily compression wraps, garments and devices in keeping w/ prescribed wear regime within 1 week of issue date to progress and retain clinical and functional gains and to limit LE progression.   Baseline dependent   Time 12   Period Weeks   Status Partially Met     OT LONG TERM GOAL #5   Title Lymphedema (LE) management/ self-care:  During Management Phase CDT Pt to sustain current limb volumes within 5%, and all other clinical gains achieved during OT treatment with needed level of caregiver assistance to limit LE progression, infection risk and further functional decline.   Baseline dependent   Time 6   Period Months   Status Partially Met               Plan - 04/23/17 1220    Clinical Impression Statement Pt tolerated all aspects of CDT w/ emphasis on LLE today, including LLE skin care and MLD, and BLE compression wraps from toes to groin. Cont as per POC. Plan to complete BLE compression  garment and device measurements next session.   Rehab Potential Good   OT Frequency 3x / week   OT Duration 12  weeks   OT Treatment/Interventions Self-care/ADL training;DME and/or AE instruction;Manual lymph drainage;Patient/family education;Compression bandaging;Therapeutic exercises;Therapeutic activities;Therapeutic exercise;Energy conservation;Manual Therapy;Other (comment)  skin care   Consulted and Agree with Plan of Care Patient      Patient will benefit from skilled therapeutic intervention in order to improve the following deficits and impairments:  Decreased knowledge of use of DME, Decreased skin integrity, Increased edema, Impaired flexibility, Pain, Decreased mobility, Decreased scar mobility, Decreased endurance, Decreased range of motion, Decreased strength, Decreased balance, Decreased knowledge of precautions, Difficulty walking, Obesity  Visit Diagnosis: Lymphedema, not elsewhere classified    Problem List Patient Active Problem List   Diagnosis Date Noted  . Cellulitis of leg, left 01/16/2017  . Cellulitis of leg,  right 12/23/2016  . AKI (acute kidney injury) ([place]) 12/19/2016  . Hyponatremia 12/19/2016  . Pressure injury of skin 12/19/2016  . Encephalopathy, hepatic ([place]) 11/20/2015  . Hepatic cirrhosis ([place]) 11/20/2015  . Thrombocytopenia ([place])   . Disorientation 11/19/2015  . Stroke or transient ischemic attack (TIA) diagnosed during current admission 11/19/2015  . OSA on CPAP 05/02/2015  . Hemorrhagic cystitis 01/29/2015  . Sepsis ([place]) 01/29/2015  . Flank pain 01/29/2015  . Sinus tachycardia 01/29/2015  . Lymphedema 01/29/2015  . Morbid obesity with body mass index of 50.0-59.9 in adult [facility]) 01/25/2015  . Obesity hypoventilation syndrome ([place]) 01/25/2015  . Snoring   . High cholesterol   . TIA (transient ischemic attack) 01/10/2015  . Morbid obesity ([place]) 01/10/2015  . Encephalopathy acute 01/09/2015  . HTN (hypertension) 01/09/2015  . Borderline diabetic 01/09/2015  . GERD (gastroesophageal reflux disease) 01/09/2015  . Acute encephalopathy     Andrey Spearman, MS, OTR/L, [facility] 04/23/17 12:22 PM  [place] MAIN [facility] SERVICES [address] [place], [place], [address] Phone: [phone]   Fax:  [phone]  Name: THORSTEN CLIMER MRN: [id number] Date of Birth: 08/30/1971

## 2017-04-25 ENCOUNTER — Encounter: Payer: BLUE CROSS/BLUE SHIELD | Admitting: Occupational Therapy

## 2017-04-29 ENCOUNTER — Ambulatory Visit: Payer: BLUE CROSS/BLUE SHIELD | Admitting: Occupational Therapy

## 2017-04-29 DIAGNOSIS — I890 Lymphedema, not elsewhere classified: Secondary | ICD-10-CM | POA: Diagnosis not present

## 2017-04-29 NOTE — Therapy (Signed)
[place] MAIN [facility] SERVICES [address] [place], [place], [address] Phone: [phone]   Fax:  [phone]  Occupational Therapy Treatment  Patient Details  Name: Ryan Burgess MRN: [id number] Date of Birth: Sep 06, 1971 Referring Provider: Christin Fudge, MD  Encounter Date: 04/29/2017      OT End of Session - 04/29/17 1610    Visit Number 28   Number of Visits 36   Date for OT Re-Evaluation 05/14/17   OT Start Time 0905   OT Stop Time 1105   OT Time Calculation (min) 120 min   Activity Tolerance Patient tolerated treatment well;No increased pain;Other (comment)  limited by body habitus   Behavior During Therapy [facility] for tasks assessed/performed      Past Medical History:  Diagnosis Date  . Cellulitis   . Cirrhosis, nonalcoholic ([place])   . GERD (gastroesophageal reflux disease)   . Headache    "maybe monthly" (12/19/2016)  . Hepatic encephalopathy ([place]) ~ 2016 X 2   "first time they thought it was a TIA; dx'd hepatic encephalopathy the 2nd time it happened"  . High cholesterol   . Hypertension   . Kidney stones 01/2015   UTI  . Migraine    "none in years; used to have them frequently" (12/19/2016)  . Obesity   . OSA on CPAP   . Pre-diabetes   . Snoring   . Venous stasis     Past Surgical History:  Procedure Laterality Date  . ESOPHAGOGASTRODUODENOSCOPY (EGD) WITH PROPOFOL N/A 07/26/2015   Procedure: ESOPHAGOGASTRODUODENOSCOPY (EGD) WITH PROPOFOL;  Surgeon: Arta Silence, MD;  Location: [facility];  Service: Endoscopy;  Laterality: N/A;  . TEE WITHOUT CARDIOVERSION N/A 04/03/2015   Procedure: TRANSESOPHAGEAL ECHOCARDIOGRAM (TEE);  Surgeon: Adrian Prows, MD;  Location: [place];  Service: Cardiovascular;  Laterality: N/A;  . WISDOM TOOTH EXTRACTION  1990    There were no vitals filed for this visit.      Subjective Assessment - 04/29/17 1607    Subjective  Pt returns for OT visit 28 for CDT to BLE w/ emphasis on LLE   initially, then RLE. Pt has no new complaints. Manufacurer's rep is here today to assist w/ final compression garment measurements for BLE.   Pertinent History Recent episode of LE cellulitis and sepsis w/ hospitalization; hx TIA, DM, morbid obesity, OSA, depression; non-smoker   Limitations Difficulty walking, difficulty w/ functional mobility and transfers, decreased ability to perform basic and instrumental ADLs (LB dressing and bathing, fitting and donning shoes and socks, difficulty performing shoping and  all home management tasks requiring standing and walking) Difficulty performing productive and work activities, restricted social participation in the community   Repetition Increases Symptoms   Patient Stated Goals decrease leg swelling and keep it from getting worse so I can do more   Currently in Pain? No/denies   Pain Onset --  chronic                      OT Treatments/Exercises (OP) - 04/29/17 0001      ADLs   ADL Education Given Yes     Manual Therapy   Manual Therapy Edema management;Compression Bandaging   Manual therapy comments finished all BLE anatomical measurements for Elvarex custom compression toe cap and knee cap.   Edema Management fitted and assessed LLE knee length ccl 3 garment and ccl 2 custom toe cap.   Compression Bandaging BLE thigh length compressin wrap applied as established  OT Education - 04/29/17 1610    Education provided Yes   Education Details Continued skilled Pt/caregiver education  And LE ADL training throughout visit for lymphedema self care/ home program, including compression wrapping, compression garment and device wear/care, lymphatic pumping ther ex, simple self-MLD, and skin care. Discussed progress towards goals.   Person(s) Educated Patient   Methods Explanation   Comprehension Verbalized understanding             OT Long Term Goals - 04/04/17 1207      OT LONG TERM GOAL #1   Title Pt  independent w/ lymphedema precautions and prevention principals and strategies to limit LE progression and infection risk.   Baseline dependent   Time 1   Period Weeks   Status Achieved     OT LONG TERM GOAL #2   Title Lymphedema (LE) management/ self-care: Pt able to apply multi layered, gradient compression wraps w Max caregiver assistance using proper techniques within 2 weeks to achieve optimal limb volume reductions bilaterally.   Baseline dependent   Time 2   Period Weeks   Status Achieved     OT LONG TERM GOAL #3   Title Lymphedema (LE) management/ self-care:  Pt to achieve at least 10% limb volume reductions bilaterally during Intensive CDT to limit LE progression, to reduce pain, and to improve safe ambulation and functional mobility.   Baseline in LLE   Time 12   Period Weeks   Status Partially Met     OT LONG TERM GOAL #4   Title Lymphedema (LE) management/ self-care:  Pt to tolerate daily compression wraps, garments and devices in keeping w/ prescribed wear regime within 1 week of issue date to progress and retain clinical and functional gains and to limit LE progression.   Baseline dependent   Time 12   Period Weeks   Status Partially Met     OT LONG TERM GOAL #5   Title Lymphedema (LE) management/ self-care:  During Management Phase CDT Pt to sustain current limb volumes within 5%, and all other clinical gains achieved during OT treatment with needed level of caregiver assistance to limit LE progression, infection risk and further functional decline.   Baseline dependent   Time 6   Period Months   Status Partially Met               Plan - 04/29/17 1611    Rehab Potential Good   OT Frequency 3x / week   OT Duration 12 weeks   OT Treatment/Interventions Self-care/ADL training;DME and/or AE instruction;Manual lymph drainage;Patient/family education;Compression bandaging;Therapeutic exercises;Therapeutic activities;Therapeutic exercise;Energy  conservation;Manual Therapy;Other (comment)  skin care   Consulted and Agree with Plan of Care Patient   Plan After removing thigh high BLE compression wraps and completing compression garment measurements, reapplied garments bilaterally. Pt tolerated all aspects of OT for LE care without difficulty. Leg swelling very well managed over visit interval and Pt was very pleased to complete measuremts today. Will fit and assess ASP. LLE knee high and custom toe cap were fitted and assed today as well. Custom garments fit well and are comfortable. We'll continue to assess for containment over the next few days. Cont as per POC.      Patient will benefit from skilled therapeutic intervention in order to improve the following deficits and impairments:  Decreased knowledge of use of DME, Decreased skin integrity, Increased edema, Impaired flexibility, Pain, Decreased mobility, Decreased scar mobility, Decreased endurance, Decreased range of motion, Decreased strength, Decreased  balance, Decreased knowledge of precautions, Difficulty walking, Obesity  Visit Diagnosis: Lymphedema, not elsewhere classified    Problem List Patient Active Problem List   Diagnosis Date Noted  . Cellulitis of leg, left 01/16/2017  . Cellulitis of leg, right 12/23/2016  . AKI (acute kidney injury) ([place]) 12/19/2016  . Hyponatremia 12/19/2016  . Pressure injury of skin 12/19/2016  . Encephalopathy, hepatic ([place]) 11/20/2015  . Hepatic cirrhosis ([place]) 11/20/2015  . Thrombocytopenia ([place])   . Disorientation 11/19/2015  . Stroke or transient ischemic attack (TIA) diagnosed during current admission 11/19/2015  . OSA on CPAP 05/02/2015  . Hemorrhagic cystitis 01/29/2015  . Sepsis ([place]) 01/29/2015  . Flank pain 01/29/2015  . Sinus tachycardia 01/29/2015  . Lymphedema 01/29/2015  . Morbid obesity with body mass index of 50.0-59.9 in adult [facility]) 01/25/2015  . Obesity hypoventilation syndrome ([place]) 01/25/2015  . Snoring   .  High cholesterol   . TIA (transient ischemic attack) 01/10/2015  . Morbid obesity ([place]) 01/10/2015  . Encephalopathy acute 01/09/2015  . HTN (hypertension) 01/09/2015  . Borderline diabetic 01/09/2015  . GERD (gastroesophageal reflux disease) 01/09/2015  . Acute encephalopathy     Ansel Bong 04/29/2017, 4:15 PM  [place] MAIN [facility] SERVICES [address] [place], [place], [address] Phone: [phone]   Fax:  [phone]  Name: CANYON LOHR MRN: [id number] Date of Birth: May 22, 1971

## 2017-04-30 ENCOUNTER — Ambulatory Visit: Payer: BLUE CROSS/BLUE SHIELD | Admitting: Occupational Therapy

## 2017-04-30 DIAGNOSIS — I890 Lymphedema, not elsewhere classified: Secondary | ICD-10-CM | POA: Diagnosis not present

## 2017-04-30 NOTE — Therapy (Signed)
[place] MAIN [facility] SERVICES [address] [place], [place], [address] Phone: [phone]   Fax:  [phone]  Occupational Therapy Treatment  Patient Details  Name: Ryan Burgess MRN: [id number] Date of Birth: 1971-06-01 Referring Provider: Christin Fudge, MD  Encounter Date: 04/30/2017      OT End of Session - 04/30/17 1356    Visit Number 29   Number of Visits 36   Date for OT Re-Evaluation 05/14/17   OT Start Time 0914   OT Stop Time 1045   OT Time Calculation (min) 91 min   Activity Tolerance Patient tolerated treatment well;No increased pain;Other (comment)  limited by body habitus   Behavior During Therapy [facility] for tasks assessed/performed      Past Medical History:  Diagnosis Date  . Cellulitis   . Cirrhosis, nonalcoholic ([place])   . GERD (gastroesophageal reflux disease)   . Headache    "maybe monthly" (12/19/2016)  . Hepatic encephalopathy ([place]) ~ 2016 X 2   "first time they thought it was a TIA; dx'd hepatic encephalopathy the 2nd time it happened"  . High cholesterol   . Hypertension   . Kidney stones 01/2015   UTI  . Migraine    "none in years; used to have them frequently" (12/19/2016)  . Obesity   . OSA on CPAP   . Pre-diabetes   . Snoring   . Venous stasis     Past Surgical History:  Procedure Laterality Date  . ESOPHAGOGASTRODUODENOSCOPY (EGD) WITH PROPOFOL N/A 07/26/2015   Procedure: ESOPHAGOGASTRODUODENOSCOPY (EGD) WITH PROPOFOL;  Surgeon: Arta Silence, MD;  [facility];  Service: Endoscopy;  Laterality: N/A;  . TEE WITHOUT CARDIOVERSION N/A 04/03/2015   Procedure: TRANSESOPHAGEAL ECHOCARDIOGRAM (TEE);  Surgeon: Adrian Prows, MD;  Location: [place];  Service: Cardiovascular;  Laterality: N/A;  . WISDOM TOOTH EXTRACTION  1990    There were no vitals filed for this visit.      Subjective Assessment - 04/30/17 1352    Subjective  Pt returns for OT visit 29 for CDT to BLE w/ emphasis on LLE   initially, then RLE. Pt states he feels tired his morning.   Pertinent History Recent episode of LE cellulitis and sepsis w/ hospitalization; hx TIA, DM, morbid obesity, OSA, depression; non-smoker   Limitations Difficulty walking, difficulty w/ functional mobility and transfers, decreased ability to perform basic and instrumental ADLs (LB dressing and bathing, fitting and donning shoes and socks, difficulty performing shoping and  all home management tasks requiring standing and walking) Difficulty performing productive and work activities, restricted social participation in the community   Repetition Increases Symptoms   Patient Stated Goals decrease leg swelling and keep it from getting worse so I can do more   Currently in Pain? No/denies   Pain Onset --  chronic                      OT Treatments/Exercises (OP) - 04/30/17 0001      ADLs   ADL Education Given Yes     Manual Therapy   Manual Therapy Manual Lymphatic Drainage (MLD);Compression Bandaging;Edema management   Manual therapy comments completed BLE custom HOS devices measurements and submitted to DME vendor via fax.   Manual Lymphatic Drainage (MLD) MLD to LLE as established   Compression Bandaging BLE thigh length compressin wrap applied as established   Other Manual Therapy skin care to LLE feet and leg w/ castor oil throughout MLD  OT Education - 04/30/17 1354    Education provided Yes   Education Details Continued skilled Pt/caregiver education  And LE ADL training throughout visit for lymphedema self care/ home program, including compression wrapping, compression garment and device wear/care, lymphatic pumping ther ex, simple self-MLD, and skin care. Discussed progress towards goals.   Person(s) Educated Patient   Methods Explanation   Comprehension Verbalized understanding             OT Long Term Goals - 04/04/17 1207      OT LONG TERM GOAL #1   Title Pt independent w/  lymphedema precautions and prevention principals and strategies to limit LE progression and infection risk.   Baseline dependent   Time 1   Period Weeks   Status Achieved     OT LONG TERM GOAL #2   Title Lymphedema (LE) management/ self-care: Pt able to apply multi layered, gradient compression wraps w Max caregiver assistance using proper techniques within 2 weeks to achieve optimal limb volume reductions bilaterally.   Baseline dependent   Time 2   Period Weeks   Status Achieved     OT LONG TERM GOAL #3   Title Lymphedema (LE) management/ self-care:  Pt to achieve at least 10% limb volume reductions bilaterally during Intensive CDT to limit LE progression, to reduce pain, and to improve safe ambulation and functional mobility.   Baseline in LLE   Time 12   Period Weeks   Status Partially Met     OT LONG TERM GOAL #4   Title Lymphedema (LE) management/ self-care:  Pt to tolerate daily compression wraps, garments and devices in keeping w/ prescribed wear regime within 1 week of issue date to progress and retain clinical and functional gains and to limit LE progression.   Baseline dependent   Time 12   Period Weeks   Status Partially Met     OT LONG TERM GOAL #5   Title Lymphedema (LE) management/ self-care:  During Management Phase CDT Pt to sustain current limb volumes within 5%, and all other clinical gains achieved during OT treatment with needed level of caregiver assistance to limit LE progression, infection risk and further functional decline.   Baseline dependent   Time 6   Period Months   Status Partially Met               Plan - 04/30/17 1356    Clinical Impression Statement Completed anatomical measurements for custom BLE [employer] HOS devices  this morning and submitted to DME. These devices are mmedically necessary to facilitate lymphatic circulation during HOS, and to limit additional fibrosis formation leading to LE progression. Pt tolerated MLD,  skin care , wrapping bilaterally and Pt edu w/ emphasis on long term OT plan and self management phase transition. Comnt as per POC.    Rehab Potential Good   OT Frequency 3x / week   OT Duration 12 weeks   OT Treatment/Interventions Self-care/ADL training;DME and/or AE instruction;Manual lymph drainage;Patient/family education;Compression bandaging;Therapeutic exercises;Therapeutic activities;Therapeutic exercise;Energy conservation;Manual Therapy;Other (comment)  skin care   Consulted and Agree with Plan of Care Patient      Patient will benefit from skilled therapeutic intervention in order to improve the following deficits and impairments:  Decreased knowledge of use of DME, Decreased skin integrity, Increased edema, Impaired flexibility, Pain, Decreased mobility, Decreased scar mobility, Decreased endurance, Decreased range of motion, Decreased strength, Decreased balance, Decreased knowledge of precautions, Difficulty walking, Obesity  Visit Diagnosis: Lymphedema, not elsewhere classified  Problem List Patient Active Problem List   Diagnosis Date Noted  . Cellulitis of leg, left 01/16/2017  . Cellulitis of leg, right 12/23/2016  . AKI (acute kidney injury) ([place]) 12/19/2016  . Hyponatremia 12/19/2016  . Pressure injury of skin 12/19/2016  . Encephalopathy, hepatic ([place]) 11/20/2015  . Hepatic cirrhosis ([place]) 11/20/2015  . Thrombocytopenia ([place])   . Disorientation 11/19/2015  . Stroke or transient ischemic attack (TIA) diagnosed during current admission 11/19/2015  . OSA on CPAP 05/02/2015  . Hemorrhagic cystitis 01/29/2015  . Sepsis ([place]) 01/29/2015  . Flank pain 01/29/2015  . Sinus tachycardia 01/29/2015  . Lymphedema 01/29/2015  . Morbid obesity with body mass index of 50.0-59.9 in adult [facility]) 01/25/2015  . Obesity hypoventilation syndrome ([place]) 01/25/2015  . Snoring   . High cholesterol   . TIA (transient ischemic attack) 01/10/2015  . Morbid obesity ([place]) 01/10/2015   . Encephalopathy acute 01/09/2015  . HTN (hypertension) 01/09/2015  . Borderline diabetic 01/09/2015  . GERD (gastroesophageal reflux disease) 01/09/2015  . Acute encephalopathy     Andrey Spearman, MS, OTR/L, [facility] 04/30/17 2:00 PM  [place] MAIN [facility] SERVICES [address] [place], [place], [address] Phone: [phone]   Fax:  [phone]  Name: CODEE TUTSON MRN: [id number] Date of Birth: 15-Aug-1971

## 2017-05-02 ENCOUNTER — Encounter: Payer: BLUE CROSS/BLUE SHIELD | Admitting: Occupational Therapy

## 2017-05-05 ENCOUNTER — Ambulatory Visit: Payer: BLUE CROSS/BLUE SHIELD | Attending: Surgery | Admitting: Occupational Therapy

## 2017-05-05 DIAGNOSIS — I890 Lymphedema, not elsewhere classified: Secondary | ICD-10-CM | POA: Diagnosis present

## 2017-05-05 NOTE — Therapy (Signed)
[place] MAIN [facility] SERVICES [address] [place], [place], [address] Phone: [phone]   Fax:  [phone]  Occupational Therapy Treatment  Patient Details  Name: Ryan Burgess MRN: [id number] Date of Birth: Sep 01, 1971 Referring Provider: Christin Fudge, MD  Encounter Date: 05/05/2017      OT End of Session - 05/05/17 1210    Visit Number 29   Number of Visits 36   Date for OT Re-Evaluation 05/14/17   OT Start Time 0804   OT Stop Time 0915   OT Time Calculation (min) 71 min   Activity Tolerance Patient tolerated treatment well;No increased pain;Other (comment)  limited by body habitus   Behavior During Therapy [facility] for tasks assessed/performed      Past Medical History:  Diagnosis Date  . Cellulitis   . Cirrhosis, nonalcoholic ([place])   . GERD (gastroesophageal reflux disease)   . Headache    "maybe monthly" (12/19/2016)  . Hepatic encephalopathy ([place]) ~ 2016 X 2   "first time they thought it was a TIA; dx'd hepatic encephalopathy the 2nd time it happened"  . High cholesterol   . Hypertension   . Kidney stones 01/2015   UTI  . Migraine    "none in years; used to have them frequently" (12/19/2016)  . Obesity   . OSA on CPAP   . Pre-diabetes   . Snoring   . Venous stasis     Past Surgical History:  Procedure Laterality Date  . ESOPHAGOGASTRODUODENOSCOPY (EGD) WITH PROPOFOL N/A 07/26/2015   Procedure: ESOPHAGOGASTRODUODENOSCOPY (EGD) WITH PROPOFOL;  Surgeon: Arta Silence, MD;  [facility];  Service: Endoscopy;  Laterality: N/A;  . TEE WITHOUT CARDIOVERSION N/A 04/03/2015   Procedure: TRANSESOPHAGEAL ECHOCARDIOGRAM (TEE);  Surgeon: Adrian Prows, MD;  Location: [place];  Service: Cardiovascular;  Laterality: N/A;  . WISDOM TOOTH EXTRACTION  1990    There were no vitals filed for this visit.      Subjective Assessment - 05/05/17 1208    Subjective  Pt returns for OT visit 30 for CDT to BLE w/ emphasis on LLE   initially, then RLE. Pt has no new complaints.   Pertinent History Recent episode of LE cellulitis and sepsis w/ hospitalization; hx TIA, DM, morbid obesity, OSA, depression; non-smoker   Limitations Difficulty walking, difficulty w/ functional mobility and transfers, decreased ability to perform basic and instrumental ADLs (LB dressing and bathing, fitting and donning shoes and socks, difficulty performing shoping and  all home management tasks requiring standing and walking) Difficulty performing productive and work activities, restricted social participation in the community   Repetition Increases Symptoms   Patient Stated Goals decrease leg swelling and keep it from getting worse so I can do more   Currently in Pain? No/denies   Pain Onset --  chronic                      OT Treatments/Exercises (OP) - 05/05/17 0001      ADLs   ADL Education Given Yes     Manual Therapy   Manual Therapy Manual Lymphatic Drainage (MLD);Compression Bandaging;Edema management   Manual therapy comments completed BLE custom HOS devices measurements and submitted to DME vendor via fax.   Manual Lymphatic Drainage (MLD) MLD to LLE as established   Compression Bandaging BLE thigh length compressin wrap applied as established   Other Manual Therapy skin care to LLE feet and leg w/ castor oil throughout MLD  OT Education - 05/05/17 1209    Education provided Yes   Education Details Cont Pt edu for LE ADL care   Person(s) Educated Patient   Methods Explanation;Demonstration   Comprehension Verbalized understanding             OT Long Term Goals - 04/04/17 1207      OT LONG TERM GOAL #1   Title Pt independent w/ lymphedema precautions and prevention principals and strategies to limit LE progression and infection risk.   Baseline dependent   Time 1   Period Weeks   Status Achieved     OT LONG TERM GOAL #2   Title Lymphedema (LE) management/ self-care: Pt  able to apply multi layered, gradient compression wraps w Max caregiver assistance using proper techniques within 2 weeks to achieve optimal limb volume reductions bilaterally.   Baseline dependent   Time 2   Period Weeks   Status Achieved     OT LONG TERM GOAL #3   Title Lymphedema (LE) management/ self-care:  Pt to achieve at least 10% limb volume reductions bilaterally during Intensive CDT to limit LE progression, to reduce pain, and to improve safe ambulation and functional mobility.   Baseline in LLE   Time 12   Period Weeks   Status Partially Met     OT LONG TERM GOAL #4   Title Lymphedema (LE) management/ self-care:  Pt to tolerate daily compression wraps, garments and devices in keeping w/ prescribed wear regime within 1 week of issue date to progress and retain clinical and functional gains and to limit LE progression.   Baseline dependent   Time 12   Period Weeks   Status Partially Met     OT LONG TERM GOAL #5   Title Lymphedema (LE) management/ self-care:  During Management Phase CDT Pt to sustain current limb volumes within 5%, and all other clinical gains achieved during OT treatment with needed level of caregiver assistance to limit LE progression, infection risk and further functional decline.   Baseline dependent   Time 6   Period Months   Status Partially Met               Plan - 05/05/17 1210    Clinical Impression Statement BLE well managed  after weekened OT interval. Pt used LLE knee high and toe cap over the weekend instead of compression wraps while visiting an old friend near [place]. Pt continues to make progress towards all LE self care goals. He is sustaining gains maid in therapy thus far with Max A from wife.. Will fit compression garments ASAP then decrease Rx to follow along status. Cont as per POC.   Occupational performance deficits (Please refer to evaluation for details): ADL's;IADL's;Rest and Sleep;Work;Leisure;Social Participation   Rehab  Potential Good   OT Frequency 3x / week   OT Duration 12 weeks   OT Treatment/Interventions Self-care/ADL training;DME and/or AE instruction;Manual lymph drainage;Patient/family education;Compression bandaging;Therapeutic exercises;Therapeutic activities;Therapeutic exercise;Energy conservation;Manual Therapy;Other (comment)  skin care   Consulted and Agree with Plan of Care Patient      Patient will benefit from skilled therapeutic intervention in order to improve the following deficits and impairments:  Decreased knowledge of use of DME, Decreased skin integrity, Increased edema, Impaired flexibility, Pain, Decreased mobility, Decreased scar mobility, Decreased endurance, Decreased range of motion, Decreased strength, Decreased balance, Decreased knowledge of precautions, Difficulty walking, Obesity  Visit Diagnosis: Lymphedema, not elsewhere classified    Problem List Patient Active Problem List   Diagnosis  Date Noted  . Cellulitis of leg, left 01/16/2017  . Cellulitis of leg, right 12/23/2016  . AKI (acute kidney injury) ([place]) 12/19/2016  . Hyponatremia 12/19/2016  . Pressure injury of skin 12/19/2016  . Encephalopathy, hepatic ([place]) 11/20/2015  . Hepatic cirrhosis ([place]) 11/20/2015  . Thrombocytopenia ([place])   . Disorientation 11/19/2015  . Stroke or transient ischemic attack (TIA) diagnosed during current admission 11/19/2015  . OSA on CPAP 05/02/2015  . Hemorrhagic cystitis 01/29/2015  . Sepsis ([place]) 01/29/2015  . Flank pain 01/29/2015  . Sinus tachycardia 01/29/2015  . Lymphedema 01/29/2015  . Morbid obesity with body mass index of 50.0-59.9 in adult [facility]) 01/25/2015  . Obesity hypoventilation syndrome ([place]) 01/25/2015  . Snoring   . High cholesterol   . TIA (transient ischemic attack) 01/10/2015  . Morbid obesity ([place]) 01/10/2015  . Encephalopathy acute 01/09/2015  . HTN (hypertension) 01/09/2015  . Borderline diabetic 01/09/2015  . GERD (gastroesophageal reflux  disease) 01/09/2015  . Acute encephalopathy     Andrey Spearman, MS, OTR/L, [facility] 05/05/17 12:14 PM  [place] MAIN [facility] SERVICES [address] [place], [place], [address] Phone: [phone]   Fax:  [phone]  Name: SAHIB PELLA MRN: [id number] Date of Birth: [birth year]

## 2017-05-06 ENCOUNTER — Ambulatory Visit: Payer: BLUE CROSS/BLUE SHIELD | Admitting: Occupational Therapy

## 2017-05-06 DIAGNOSIS — I890 Lymphedema, not elsewhere classified: Secondary | ICD-10-CM | POA: Diagnosis not present

## 2017-05-06 NOTE — Therapy (Signed)
[place] MAIN [facility] SERVICES [address] [place], [place], [address] Phone: [phone]   Fax:  [phone]  Occupational Therapy Treatment Note and Progress Report  Patient Details  Name: Ryan Burgess MRN: [id number] Date of Birth: 1971/05/20 Referring Provider: Christin Fudge, MD  Encounter Date: 05/06/2017      OT End of Session - 05/06/17 1632    Visit Number 31   Number of Visits 36   Date for OT Re-Evaluation 05/14/17   OT Start Time 0805   OT Stop Time 0915   OT Time Calculation (min) 70 min   Activity Tolerance Patient tolerated treatment well;No increased pain;Other (comment)  limited by body habitus   Behavior During Therapy [facility] for tasks assessed/performed      Past Medical History:  Diagnosis Date  . Cellulitis   . Cirrhosis, nonalcoholic ([place])   . GERD (gastroesophageal reflux disease)   . Headache    "maybe monthly" (12/19/2016)  . Hepatic encephalopathy ([place]) ~ 2016 X 2   "first time they thought it was a TIA; dx'd hepatic encephalopathy the 2nd time it happened"  . High cholesterol   . Hypertension   . Kidney stones 01/2015   UTI  . Migraine    "none in years; used to have them frequently" (12/19/2016)  . Obesity   . OSA on CPAP   . Pre-diabetes   . Snoring   . Venous stasis     Past Surgical History:  Procedure Laterality Date  . ESOPHAGOGASTRODUODENOSCOPY (EGD) WITH PROPOFOL N/A 07/26/2015   Procedure: ESOPHAGOGASTRODUODENOSCOPY (EGD) WITH PROPOFOL;  Surgeon: Arta Silence, MD;  [facility];  Service: Endoscopy;  Laterality: N/A;  . TEE WITHOUT CARDIOVERSION N/A 04/03/2015   Procedure: TRANSESOPHAGEAL ECHOCARDIOGRAM (TEE);  Surgeon: Adrian Prows, MD;  Location: [place];  Service: Cardiovascular;  Laterality: N/A;  . WISDOM TOOTH EXTRACTION  1990    There were no vitals filed for this visit.      Subjective Assessment - 05/06/17 1628    Subjective  Pt returns for OT visit 31 for CDT to  BLE w/ emphasis on LLE  initially, then RLE. Pt has no new complaints. Pt arrives w/ BLE compression wraps in place.   Pertinent History Recent episode of LE cellulitis and sepsis w/ hospitalization; hx TIA, DM, morbid obesity, OSA, depression; non-smoker   Limitations Difficulty walking, difficulty w/ functional mobility and transfers, decreased ability to perform basic and instrumental ADLs (LB dressing and bathing, fitting and donning shoes and socks, difficulty performing shoping and  all home management tasks requiring standing and walking) Difficulty performing productive and work activities, restricted social participation in the community   Repetition Increases Symptoms   Patient Stated Goals decrease leg swelling and keep it from getting worse so I can do more   Currently in Pain? No/denies   Pain Onset --  chronic                      OT Treatments/Exercises (OP) - 05/06/17 0001      ADLs   ADL Education Given Yes     Manual Therapy   Manual Therapy Manual Lymphatic Drainage (MLD);Compression Bandaging;Edema management   Manual Lymphatic Drainage (MLD) MLD to LLE as established   Compression Bandaging BLE thigh length compressin wrap applied as established   Other Manual Therapy skin care to LLE feet and leg w/ castor oil throughout MLD  OT Education - 05/05/17 1209    Education provided Yes   Education Details Cont Pt edu for LE ADL care   Person(s) Educated Patient   Methods Explanation;Demonstration   Comprehension Verbalized understanding             OT Long Term Goals - 05/06/17 1634      OT LONG TERM GOAL #1   Title Pt independent w/ lymphedema precautions and prevention principals and strategies to limit LE progression and infection risk.   Baseline dependent   Time 1   Period Weeks   Status Achieved     OT LONG TERM GOAL #2   Title Lymphedema (LE) management/ self-care: Pt able to apply multi layered, gradient  compression wraps w Max caregiver assistance using proper techniques within 2 weeks to achieve optimal limb volume reductions bilaterally.   Baseline dependent   Time 2   Period Weeks   Status Achieved     OT LONG TERM GOAL #3   Title Lymphedema (LE) management/ self-care:  Pt to achieve at least 10% limb volume reductions bilaterally during Intensive CDT to limit LE progression, to reduce pain, and to improve safe ambulation and functional mobility.   Baseline in LLE   Time 12   Period Weeks   Status Achieved     OT LONG TERM GOAL #4   Title Lymphedema (LE) management/ self-care:  Pt to tolerate daily compression wraps, garments and devices in keeping w/ prescribed wear regime within 1 week of issue date to progress and retain clinical and functional gains and to limit LE progression.   Baseline dependent   Time 12   Period Weeks   Status Partially Met     OT LONG TERM GOAL #5   Title Lymphedema (LE) management/ self-care:  During Management Phase CDT Pt to sustain current limb volumes within 5%, and all other clinical gains achieved during OT treatment with needed level of caregiver assistance to limit LE progression, infection risk and further functional decline.   Baseline dependent   Time 6   Period Months   Status On-going               Plan - 05/06/17 1635    Rehab Potential Good   OT Frequency 3x / week   OT Duration 12 weeks   OT Treatment/Interventions Self-care/ADL training;DME and/or AE instruction;Manual lymph drainage;Patient/family education;Compression bandaging;Therapeutic exercises;Therapeutic activities;Therapeutic exercise;Energy conservation;Manual Therapy;Other (comment)  skin care   Consulted and Agree with Plan of Care Patient      Patient will benefit from skilled therapeutic intervention in order to improve the following deficits and impairments:  Decreased knowledge of use of DME, Decreased skin integrity, Increased edema, Impaired flexibility,  Pain, Decreased mobility, Decreased scar mobility, Decreased endurance, Decreased range of motion, Decreased strength, Decreased balance, Decreased knowledge of precautions, Difficulty walking, Obesity  Visit Diagnosis: Lymphedema, not elsewhere classified    Problem List Patient Active Problem List   Diagnosis Date Noted  . Cellulitis of leg, left 01/16/2017  . Cellulitis of leg, right 12/23/2016  . AKI (acute kidney injury) ([place]) 12/19/2016  . Hyponatremia 12/19/2016  . Pressure injury of skin 12/19/2016  . Encephalopathy, hepatic ([place]) 11/20/2015  . Hepatic cirrhosis ([place]) 11/20/2015  . Thrombocytopenia ([place])   . Disorientation 11/19/2015  . Stroke or transient ischemic attack (TIA) diagnosed during current admission 11/19/2015  . OSA on CPAP 05/02/2015  . Hemorrhagic cystitis 01/29/2015  . Sepsis ([place]) 01/29/2015  . Flank pain 01/29/2015  . Sinus  tachycardia 01/29/2015  . Lymphedema 01/29/2015  . Morbid obesity with body mass index of 50.0-59.9 in adult [facility]) 01/25/2015  . Obesity hypoventilation syndrome ([place]) 01/25/2015  . Snoring   . High cholesterol   . TIA (transient ischemic attack) 01/10/2015  . Morbid obesity ([place]) 01/10/2015  . Encephalopathy acute 01/09/2015  . HTN (hypertension) 01/09/2015  . Borderline diabetic 01/09/2015  . GERD (gastroesophageal reflux disease) 01/09/2015  . Acute encephalopathy     Andrey Spearman, MS, OTR/L, [facility] 05/06/17 4:39 PM  [place] MAIN [facility] SERVICES [address] [place], [place], [address] Phone: [phone]   Fax:  [phone]  Name: MERWIN BREDEN MRN: [id number] Date of Birth: 08-30-1971

## 2017-05-08 ENCOUNTER — Encounter: Payer: BLUE CROSS/BLUE SHIELD | Admitting: Occupational Therapy

## 2017-05-12 ENCOUNTER — Encounter: Payer: BLUE CROSS/BLUE SHIELD | Admitting: Occupational Therapy

## 2017-05-13 ENCOUNTER — Encounter: Payer: BLUE CROSS/BLUE SHIELD | Admitting: Occupational Therapy

## 2017-05-15 ENCOUNTER — Encounter: Payer: BLUE CROSS/BLUE SHIELD | Admitting: Occupational Therapy

## 2017-05-19 ENCOUNTER — Encounter: Payer: BLUE CROSS/BLUE SHIELD | Admitting: Occupational Therapy

## 2017-05-20 ENCOUNTER — Encounter: Payer: BLUE CROSS/BLUE SHIELD | Admitting: Occupational Therapy

## 2017-05-20 ENCOUNTER — Other Ambulatory Visit: Payer: Self-pay | Admitting: Adult Health

## 2017-05-22 ENCOUNTER — Encounter: Payer: BLUE CROSS/BLUE SHIELD | Admitting: Occupational Therapy

## 2017-05-26 ENCOUNTER — Encounter: Payer: BLUE CROSS/BLUE SHIELD | Admitting: Occupational Therapy

## 2017-05-27 ENCOUNTER — Encounter: Payer: BLUE CROSS/BLUE SHIELD | Admitting: Occupational Therapy

## 2017-05-29 ENCOUNTER — Encounter: Payer: BLUE CROSS/BLUE SHIELD | Admitting: Occupational Therapy

## 2017-06-03 ENCOUNTER — Encounter: Payer: Self-pay | Admitting: Adult Health

## 2017-06-05 ENCOUNTER — Encounter: Payer: Self-pay | Admitting: Adult Health

## 2017-06-05 ENCOUNTER — Ambulatory Visit (INDEPENDENT_AMBULATORY_CARE_PROVIDER_SITE_OTHER): Payer: BLUE CROSS/BLUE SHIELD | Admitting: Adult Health

## 2017-06-05 VITALS — BP 133/63 | HR 74 | Ht 76.0 in | Wt >= 6400 oz

## 2017-06-05 DIAGNOSIS — Z9989 Dependence on other enabling machines and devices: Secondary | ICD-10-CM

## 2017-06-05 DIAGNOSIS — G4733 Obstructive sleep apnea (adult) (pediatric): Secondary | ICD-10-CM | POA: Diagnosis not present

## 2017-06-05 NOTE — Progress Notes (Signed)
PATIENT: Ryan Burgess DOB: 1971/10/21  REASON FOR VISIT: follow up- osa on cpap HISTORY FROM: patient  HISTORY OF PRESENT ILLNESS: 06/05/17 Ryan Burgess is a 46 year old male with a history of obstructive sleep apnea on CPAP. He returns today for a compliance download. He is down indicates that he used his machine 30 out of 30 days for compliance of 100%. He uses machine greater than 4 hours 29 out of 30 days for compliance of 97%. On average he uses his machine 6 hours and 59 minutes. His residual AHI is 4.6 on a minimum pressure of 5 cm water and max pressure 15 cm water with EPR of 3. His leak in the 95th percentile is 24.7 L/m. He states that he is due for new supplies. He states in the last several months he has noticed a fine tremor in the hands. He feels this may be related to Zoloft. He returns today for an evaluation.  HISTORY 05/01/16: Ryan Burgess is a 46 year old male with a history of obstructive sleep apnea on CPAP. He returns today for a compliance download. His download indicates that he uses his machine 30 out of 30 days for compliance of 100%. He uses machine greater than 4 hours 30 out of 30 days for compliance of 100%. On average he uses his machine 7 hours and 16 minutes. He is on auto set with a minimum pressure of 5 cm of water and maximum pressure of 15 cm of water with EPR of 3. His residual AHI is 0.9. He has a leak in the 95th percentile at 28.6 L/m. Overall the patient feels that the CPAP has been beneficial. He states that it has decreased his dyatime sleepiness although not completely resolved. He states that he is due for new supplies. He wears a full face mask and also has a beard. He states that sometimes this will cause a leak. He denies any new neurological symptoms. He returns today for an evaluation.  HISTORY (DOHMEIER): Ryan Burgess is a 46 y.o. male seen here as a revisit from Dr. Leonie Man , after a hospitalization on the stroke service.,  Mr.  Jennelle Burgess carries the diagnosis of hypertension, borderline diabetes which in H be A1c of 7.0 12- 2014 and earlier this month at 6.1,  morbid obesity, peripheral edema, hyperlipidemia, thrombocytopenia.  As to the possibility of having sleep apnea, Ryan Burgess is married and his wife has observed his sleep she also has reported to him that he is a loud snorer and she has witnessed apneas as has the hospital staff during his hospitalization. His practitioner and ICU nurse both stated that he had witnessed sleep apnea is doing his hospital stay. These were associated with significant desaturations in oxygen and the possibility of CO2 retention was raised in a setting that is usually described as obesity hypoventilation syndrome. They found Ryan Burgess to be a shallow breather and in relation to his body mass index he may not be able to exhale CO2 and exchange it for oxygen. The retention of CO2 also called hypercapnia can then lead to confusion, global headaches, and significant nocturia of his enuresis.  The patient goes to bed between 10 and 11 PM and usually does not have trouble to fall asleep. His sleep latency is between 15 and 30 minutes usually. He would have usually only one bathroom break at night about 1:59 AM and he recalls that dreams stages seemed to start after the bathroom break so it is likely that  his REM sleep takes place in the later night or early morning hours. 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 The patient prefers to sleep on his side, and he wakes up on his side as well. His alarm clock is usually set for 6 AM but his wife states that for 5 AM and he often wakes up with that already. He wakes up most mornings with a morning headache that resolves as the day goes off on, also a symptom of hypercapnia. He does not necessarily feel refreshed or restored in the  morning.  He would get an average of 6 hours of sleep at night he rarely naps in daytime letting the opportunity to do so. After lunch she is usually quite sleepy. He endorsed the Epworth sleepiness score at 18 points of the fatigue severity score at 33 points. On weekends he also can sleep in but usually doesn't feel more restored from the additional hour of sleep. The patient does not smoke and uses no tobacco products as all. The patient drinks only socially not regularly he endorsed 1-2 drinks every 3 months. He does drink caffeine 1 cup or soda on some days . The family history positive for sleep apnea in his father, grandfather did have witnessed apnea.  No ENT surgery, neck surgery or trauma reported.  He is not a shift worker, regularly works from 8.30 AM to 5 o'clock PM.   REVIEW OF SYSTEMS: Out of a complete 14 system review of symptoms, the patient complains only of the following symptoms, and all other reviewed systems are negative.  Epworth sleepiness score 9, fatigue severity scale 38 Tremors, depression, apnea, leg swelling, diarrhea  ALLERGIES: No Known Allergies  HOME MEDICATIONS: Outpatient Medications Prior to Visit  Medication Sig Dispense Refill  . acetaminophen (TYLENOL) 500 MG tablet Take 1,000 mg by mouth every 6 (six) hours as needed for headache.    Marland Kitchen aspirin EC 81 MG EC tablet Take 1 tablet (81 mg total) by mouth daily. 30 tablet 11  . atorvastatin (LIPITOR) 20 MG tablet Take 1 tablet (20 mg total) by mouth at bedtime. 30 tablet 5  . blood glucose meter kit and supplies KIT Dispense based on patient and insurance preference. Use up to four times daily as directed. (FOR ICD-9 250.00, 250.01). 1 each 0  . dexlansoprazole (DEXILANT) 60 MG capsule Take 60 mg by mouth daily.    . furosemide (LASIX) 40 MG tablet TAKE 1 TABLET BY MOUTH DAILY. 90 tablet 0  . lactulose, encephalopathy, (CHRONULAC) 10 GM/15ML SOLN Take 45 mls by mouth 3 times a day  3  . loratadine  (CLARITIN) 10 MG tablet Take 10 mg by mouth daily as needed for allergies.    . Multiple Vitamin (MULTIVITAMIN WITH MINERALS) TABS tablet Take 1 tablet by mouth daily.    . rifaximin (XIFAXAN) 550 MG TABS tablet Take 1 tablet (550 mg total) by mouth 2 (two) times daily. 7 tablet 0  . saccharomyces boulardii (FLORASTOR) 250 MG capsule Take 1 capsule (250 mg total) by mouth 2 (two) times daily. 180 capsule 0  . sertraline (ZOLOFT) 50 MG tablet Take 1 tablet by mouth daily.  12  . Vitamin D, Ergocalciferol, (DRISDOL) 50000 units CAPS capsule Take 1 capsule (50,000 Units total) by mouth every 7 (seven) days. 12 capsule 0  . Silver (AQUACEL AG FOAM) 4"X4" PADS  Apply 1 Units topically daily. (Patient not taking: Reported on 06/05/2017) 30 each 0   No facility-administered medications prior to visit.     PAST MEDICAL HISTORY: Past Medical History:  Diagnosis Date  . Cellulitis   . Cirrhosis, nonalcoholic ([place])   . GERD (gastroesophageal reflux disease)   . Headache    "maybe monthly" (12/19/2016)  . Hepatic encephalopathy ([place]) ~ 2016 X 2   "first time they thought it was a TIA; dx'd hepatic encephalopathy the 2nd time it happened"  . High cholesterol   . Hypertension   . Kidney stones 01/2015   UTI  . Migraine    "none in years; used to have them frequently" (12/19/2016)  . Obesity   . OSA on CPAP   . Pre-diabetes   . Snoring   . Venous stasis     PAST SURGICAL HISTORY: Past Surgical History:  Procedure Laterality Date  . ESOPHAGOGASTRODUODENOSCOPY (EGD) WITH PROPOFOL N/A 07/26/2015   Procedure: ESOPHAGOGASTRODUODENOSCOPY (EGD) WITH PROPOFOL;  Surgeon: Arta Silence, MD;  Location: [facility];  Service: Endoscopy;  Laterality: N/A;  . TEE WITHOUT CARDIOVERSION N/A 04/03/2015   Procedure: TRANSESOPHAGEAL ECHOCARDIOGRAM (TEE);  Surgeon: Adrian Prows, MD;  Location: [facility];  Service: Cardiovascular;  Laterality: N/A;  . WISDOM TOOTH EXTRACTION  1990    FAMILY HISTORY: Family History   Problem Relation Age of Onset  . Leukemia Mother   . Diabetes Father   . Heart disease Maternal Grandmother   . Heart disease Paternal Grandmother   . Dementia Paternal Grandmother   . Diabetes Paternal Grandmother   . Frontotemporal dementia Paternal Grandmother   . COPD Maternal Grandfather   . Heart disease Paternal Grandfather   . Diabetes Paternal Grandfather     SOCIAL HISTORY: Social History   Social History  . Marital status: Married    Spouse name: Hoyle Sauer  . Number of children: 2  . Years of education: masters   Occupational History  . Not on file.   Social History Main Topics  . Smoking status: Never Smoker  . Smokeless tobacco: Never Used  . Alcohol use 0.6 - 1.2 oz/week    1 - 2 Standard drinks or equivalent per week     Comment: once a year  . Drug use: No  . Sexual activity: Yes    Birth control/ protection: None   Other Topics Concern  . Not on file   Social History Narrative   Patient lives at home with his wife Hoyle Sauer) and two children.    Patient works full time at [employer] in [place].   Right handed.   Caffeine 1-2 sodas and coffee daily.      PHYSICAL EXAM  Vitals:   06/05/17 0844  BP: 133/63  Pulse: 74  Weight: (!) 485 lb (220 kg)  Height: '6\' 4"'$  (1.93 m)   Body mass index is 59.04 kg/m.  Generalized: Well developed, in no acute distress, obese  Neurological examination  Mentation: Alert oriented to time, place, history taking. Follows all commands speech and language fluent Cranial nerve II-XII: Pupils were equal round reactive to light. Extraocular movements were full, visual field were full on confrontational test. Facial sensation and strength were normal. Uvula tongue midline. Head turning and shoulder shrug  were normal and symmetric. Motor: The motor testing reveals 5 over 5 strength of all 4 extremities. Good symmetric motor tone is noted throughout.  Sensory: Sensory testing  is intact to soft  touch on all 4 extremities. No evidence of extinction is noted.  Coordination: Cerebellar testing reveals good finger-nose-finger and heel-to-shin bilaterally.  Gait and station: Gait is normal.  Reflexes: Deep tendon reflexes are symmetric and normal bilaterally.   DIAGNOSTIC DATA (LABS, IMAGING, TESTING) - I reviewed patient records, labs, notes, testing and imaging myself where available.  Lab Results  Component Value Date   WBC 3.6 (L) 01/18/2017   HGB 11.1 (L) 01/18/2017   HCT 31.9 (L) 01/18/2017   MCV 94.4 01/18/2017   PLT 72 (L) 01/18/2017      Component Value Date/Time   NA 138 01/18/2017 0649   NA 142 01/16/2017 0859   NA 143 01/09/2015 1413   K 3.5 01/18/2017 0649   K 3.8 01/09/2015 1413   CL 107 01/18/2017 0649   CL 111 (H) 01/09/2015 1413   CO2 24 01/18/2017 0649   CO2 22 01/09/2015 1413   GLUCOSE 76 01/18/2017 0649   GLUCOSE 115 (H) 01/09/2015 1413   BUN 9 01/18/2017 0649   BUN 16 01/16/2017 0859   BUN 15 01/09/2015 1413   CREATININE 0.77 01/18/2017 0649   CREATININE 0.66 01/09/2015 1413   CALCIUM 7.6 (L) 01/18/2017 0649   CALCIUM 8.1 (L) 01/09/2015 1413   PROT 6.3 (L) 01/16/2017 1919   PROT 6.6 01/16/2017 0859   PROT 6.4 01/09/2015 1413   ALBUMIN 2.2 (L) 01/16/2017 1919   ALBUMIN 2.7 (L) 01/16/2017 0859   ALBUMIN 2.4 (L) 01/09/2015 1413   AST 56 (H) 01/16/2017 1919   AST 58 (H) 01/09/2015 1413   ALT 24 01/16/2017 1919   ALT 38 01/09/2015 1413   ALKPHOS 78 01/16/2017 1919   ALKPHOS 94 01/09/2015 1413   BILITOT 1.5 (H) 01/16/2017 1919   BILITOT 1.4 (H) 01/16/2017 0859   BILITOT 0.8 01/09/2015 1413   GFRNONAA >60 01/18/2017 0649   GFRNONAA >60 01/09/2015 1413   GFRAA >60 01/18/2017 0649   GFRAA >60 01/09/2015 1413   Lab Results  Component Value Date   CHOL 91 (L) 01/16/2017   HDL 38 (L) 01/16/2017   LDLCALC 42 01/16/2017   TRIG 55 01/16/2017   CHOLHDL 2.4 01/16/2017   Lab Results  Component Value Date   HGBA1C 4.9  04/22/2017   No results found for: [id number] Lab Results  Component Value Date   TSH 3.820 01/16/2017      ASSESSMENT AND PLAN 46 y.o. year old male  has a past medical history of Cellulitis; Cirrhosis, nonalcoholic ([place]); GERD (gastroesophageal reflux disease); Headache; Hepatic encephalopathy ([place]) (~ 2016 X 2); High cholesterol; Hypertension; Kidney stones (01/2015); Migraine; Obesity; OSA on CPAP; Pre-diabetes; Snoring; and Venous stasis. here with:  1. OSA on CPAP  The patient CPAP download shows excellent compliance. He is encouraged to continue using his CPAP nightly. I have advised that he should request new supplies to replace his mask and hopefully eliminate or reduce the leakage. Patient advised that if his tremor worsens he should make his primary care aware. If they feel that it may be neurologic we will happily see the patient for that. Patient voiced understanding. He will follow-up in one year with Dr. Brett Fairy  I spent 15 minutes with the patient. 50% of this time was spent reviewing CPAP download   Ward Givens, MSN, NP-C 06/05/2017, 9:10 AM [facility] [address], [place], [place] [address] [phone]

## 2017-06-05 NOTE — Progress Notes (Signed)
I agree with the assessment and plan as directed by NP .The patient is known to me .   Juanangel Soderholm, MD  

## 2017-06-05 NOTE — Patient Instructions (Signed)
Your Plan:  Continue using CPAP nightly  Replace supplies If your symptoms worsen or you develop new symptoms please let us know.       Thank you for coming to see [place] at [facility]. I hope we have been able to provide you high quality care today.  You may receive a patient satisfaction survey over the next few weeks. We would appreciate your feedback and comments so that we may continue to improve ourselves and the health of our patients.

## 2017-06-18 ENCOUNTER — Ambulatory Visit: Payer: BLUE CROSS/BLUE SHIELD | Attending: Surgery | Admitting: Occupational Therapy

## 2017-06-18 DIAGNOSIS — I890 Lymphedema, not elsewhere classified: Secondary | ICD-10-CM | POA: Insufficient documentation

## 2017-06-19 NOTE — Therapy (Signed)
[place] MAIN [facility] SERVICES [address] [place], [place], [address] Phone: [phone]   Fax:  [phone]  Occupational Therapy Treatment  Patient Details  Name: Ryan Burgess MRN: [id number] Date of Birth: Oct 16, 1971 Referring Provider: Christin Fudge, MD  Encounter Date: 06/18/2017      OT End of Session - 06/19/17 1516    Visit Number 32   Number of Visits 36   Date for OT Re-Evaluation 05/14/17   OT Start Time 0905   OT Stop Time 1035   OT Time Calculation (min) 90 min   Activity Tolerance Patient tolerated treatment well;No increased pain;Other (comment)  limited by body habitus   Behavior During Therapy [facility] for tasks assessed/performed      Past Medical History:  Diagnosis Date  . Cellulitis   . Cirrhosis, nonalcoholic ([place])   . GERD (gastroesophageal reflux disease)   . Headache    "maybe monthly" (12/19/2016)  . Hepatic encephalopathy ([place]) ~ 2016 X 2   "first time they thought it was a TIA; dx'd hepatic encephalopathy the 2nd time it happened"  . High cholesterol   . Hypertension   . Kidney stones 01/2015   UTI  . Migraine    "none in years; used to have them frequently" (12/19/2016)  . Obesity   . OSA on CPAP   . Pre-diabetes   . Snoring   . Venous stasis     Past Surgical History:  Procedure Laterality Date  . ESOPHAGOGASTRODUODENOSCOPY (EGD) WITH PROPOFOL N/A 07/26/2015   Procedure: ESOPHAGOGASTRODUODENOSCOPY (EGD) WITH PROPOFOL;  Surgeon: Arta Silence, MD;  [facility];  Service: Endoscopy;  Laterality: N/A;  . TEE WITHOUT CARDIOVERSION N/A 04/03/2015   Procedure: TRANSESOPHAGEAL ECHOCARDIOGRAM (TEE);  Surgeon: Adrian Prows, MD;  Location: [facility];  Service: Cardiovascular;  Laterality: N/A;  . WISDOM TOOTH EXTRACTION  1990    There were no vitals filed for this visit.      Subjective Assessment - 06/19/17 1515    Subjective  Pt returns for OT visit 32 for CDT to complete custom  compression garment re-measurements. Pt has no new complaints. Pt is very pleased w/ custom knee highs fit and function.   Pertinent History Recent episode of LE cellulitis and sepsis w/ hospitalization; hx TIA, DM, morbid obesity, OSA, depression; non-smoker   Limitations Difficulty walking, difficulty w/ functional mobility and transfers, decreased ability to perform basic and instrumental ADLs (LB dressing and bathing, fitting and donning shoes and socks, difficulty performing shoping and  all home management tasks requiring standing and walking) Difficulty performing productive and work activities, restricted social participation in the community   Repetition Increases Symptoms   Patient Stated Goals decrease leg swelling and keep it from getting worse so I can do more   Currently in Pain? No/denies   Pain Onset --  chronic                      OT Treatments/Exercises (OP) - 06/19/17 0001      ADLs   ADL Education Given Yes     Manual Therapy   Manual Therapy Edema management   Manual therapy comments completed REMEASUREMENTS for custom compression capris. Increased knee high compression class from ccl 3 to ccl 3 F   Compression Bandaging BLE compression wraps applied above knee length stockings.                OT Education - 06/19/17 1517    Education provided  Yes   Education Details Continued skilled Pt/caregiver education  And LE ADL training throughout visit for lymphedema self care/ home program, including compression wrapping, compression garment and device wear/care, lymphatic pumping ther ex, simple self-MLD, and skin care. Discussed progress towards goals.   Person(s) Educated Patient   Methods Explanation;Demonstration   Comprehension Verbalized understanding             OT Long Term Goals - 05/06/17 1634      OT LONG TERM GOAL #1   Title Pt independent w/ lymphedema precautions and prevention principals and strategies to limit LE  progression and infection risk.   Baseline dependent   Time 1   Period Weeks   Status Achieved     OT LONG TERM GOAL #2   Title Lymphedema (LE) management/ self-care: Pt able to apply multi layered, gradient compression wraps w Max caregiver assistance using proper techniques within 2 weeks to achieve optimal limb volume reductions bilaterally.   Baseline dependent   Time 2   Period Weeks   Status Achieved     OT LONG TERM GOAL #3   Title Lymphedema (LE) management/ self-care:  Pt to achieve at least 10% limb volume reductions bilaterally during Intensive CDT to limit LE progression, to reduce pain, and to improve safe ambulation and functional mobility.   Baseline in LLE   Time 12   Period Weeks   Status Achieved     OT LONG TERM GOAL #4   Title Lymphedema (LE) management/ self-care:  Pt to tolerate daily compression wraps, garments and devices in keeping w/ prescribed wear regime within 1 week of issue date to progress and retain clinical and functional gains and to limit LE progression.   Baseline dependent   Time 12   Period Weeks   Status Partially Met     OT LONG TERM GOAL #5   Title Lymphedema (LE) management/ self-care:  During Management Phase CDT Pt to sustain current limb volumes within 5%, and all other clinical gains achieved during OT treatment with needed level of caregiver assistance to limit LE progression, infection risk and further functional decline.   Baseline dependent   Time 6   Period Months   Status On-going               Plan - 06/19/17 1520    Clinical Impression Statement Completed REmeasurement for custom capri leg extensions w/ bike shorts in effort to improve fit and function. Initial garments would not stay up after walking only a few steps. AIncreased ccl from 3 to 3 F in knee highs for improcved containment. Pt agrees to return for fitting. OT submitted measurements to DME vendor.   Rehab Potential Good   OT Frequency 3x / week   OT  Duration 12 weeks   OT Treatment/Interventions Self-care/ADL training;DME and/or AE instruction;Manual lymph drainage;Patient/family education;Compression bandaging;Therapeutic exercises;Therapeutic activities;Therapeutic exercise;Energy conservation;Manual Therapy;Other (comment)  skin care   Consulted and Agree with Plan of Care Patient      Patient will benefit from skilled therapeutic intervention in order to improve the following deficits and impairments:  Decreased knowledge of use of DME, Decreased skin integrity, Increased edema, Impaired flexibility, Pain, Decreased mobility, Decreased scar mobility, Decreased endurance, Decreased range of motion, Decreased strength, Decreased balance, Decreased knowledge of precautions, Difficulty walking, Obesity  Visit Diagnosis: Lymphedema, not elsewhere classified    Problem List Patient Active Problem List   Diagnosis Date Noted  . Cellulitis of leg, left 01/16/2017  . Cellulitis of  leg, right 12/23/2016  . AKI (acute kidney injury) ([place]) 12/19/2016  . Hyponatremia 12/19/2016  . Pressure injury of skin 12/19/2016  . Encephalopathy, hepatic ([place]) 11/20/2015  . Hepatic cirrhosis ([place]) 11/20/2015  . Thrombocytopenia ([place])   . Disorientation 11/19/2015  . Stroke or transient ischemic attack (TIA) diagnosed during current admission 11/19/2015  . OSA on CPAP 05/02/2015  . Hemorrhagic cystitis 01/29/2015  . Sepsis ([place]) 01/29/2015  . Flank pain 01/29/2015  . Sinus tachycardia 01/29/2015  . Lymphedema 01/29/2015  . Morbid obesity with body mass index of 50.0-59.9 in adult [facility]) 01/25/2015  . Obesity hypoventilation syndrome ([place]) 01/25/2015  . Snoring   . High cholesterol   . TIA (transient ischemic attack) 01/10/2015  . Morbid obesity ([place]) 01/10/2015  . Encephalopathy acute 01/09/2015  . HTN (hypertension) 01/09/2015  . Borderline diabetic 01/09/2015  . GERD (gastroesophageal reflux disease) 01/09/2015  . Acute encephalopathy      Andrey Spearman, MS, OTR/L, [facility] 06/19/17 3:23 PM  [place] MAIN [facility] SERVICES [address] [place], [place], [address] Phone: [phone]   Fax:  [phone]  Name: KHALEB BROZ MRN: [id number] Date of Birth: 03-02-1971

## 2017-07-03 ENCOUNTER — Ambulatory Visit: Payer: BLUE CROSS/BLUE SHIELD | Admitting: Occupational Therapy

## 2017-07-21 ENCOUNTER — Telehealth: Payer: Self-pay | Admitting: *Deleted

## 2017-07-21 NOTE — Telephone Encounter (Signed)
Received request for Medical records from [place], forwarded to [place] for email/scan/SLS 08/20

## 2017-08-14 DIAGNOSIS — Z0271 Encounter for disability determination: Secondary | ICD-10-CM

## 2017-09-05 ENCOUNTER — Other Ambulatory Visit: Payer: Self-pay | Admitting: Adult Health

## 2017-09-08 ENCOUNTER — Other Ambulatory Visit: Payer: Self-pay

## 2017-09-08 MED ORDER — ATORVASTATIN CALCIUM 20 MG PO TABS: 20.0000 mg | ORAL_TABLET | Freq: Every day | ORAL | 0 refills | Status: DC

## 2017-09-08 NOTE — Telephone Encounter (Signed)
Needs OV with fasting labs prior to refill.

## 2017-09-08 NOTE — Telephone Encounter (Signed)
We have not prescribed these medications for the patient previously.  Please review and refill if appropriate.  T. Leylanie Woodmansee, CMA  

## 2017-09-08 NOTE — Telephone Encounter (Signed)
Staff message to Annabell Sabal to call pt for OV with labs.  Charyl Bigger, CMA

## 2017-09-15 ENCOUNTER — Ambulatory Visit: Payer: BLUE CROSS/BLUE SHIELD | Admitting: Adult Health

## 2017-09-15 NOTE — Progress Notes (Deleted)
Subjective:    Patient ID: Ryan Burgess, male    DOB: January 09, 1971, 46 y.o.   MRN: [id number]  HPI:  04/22/2017 OV Notes: Ryan Burgess is here for CPE.  He is compliant on all medications and denies SE.  He is followed by GI (Encephalopathy, hepatic cirrhosis), and ID (bil lower ext cellulitis).  He has lost 20 lbs since last OV, mostly r/t fluid reduction of lower extremities (he estimates edema has reduced > 30% since onset of acute exacerbation).  He reports anxiety/depression has improved on Sertraline '50mg'$  daily.  He denies thoughts of harming himself/others.  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  He is very vague about his diet and feels that his goal wt is around 300 lbs.  He continues to struggle walking even short distances.   Today's OV Notes: Ryan Burgess is here for regular f/u: HTN, GERD,   Patient Care Team    Relationship Specialty Notifications Start End  Esaw Grandchild, NP PCP - General Family Medicine  01/16/17   Christin Fudge, MD Consulting Physician Surgery  01/16/17   Arta Silence, MD Consulting Physician Gastroenterology  01/16/17     Patient Active Problem List   Diagnosis Date Noted  . Cellulitis of leg, left 01/16/2017  . Cellulitis of leg, right 12/23/2016  . AKI (acute kidney injury) ([place]) 12/19/2016  . Hyponatremia 12/19/2016  . Pressure injury of skin 12/19/2016  . Encephalopathy, hepatic ([place]) 11/20/2015  . Hepatic cirrhosis ([place]) 11/20/2015  . Thrombocytopenia ([place])   . Disorientation 11/19/2015  . Stroke or transient ischemic attack (TIA) diagnosed during current admission 11/19/2015  . OSA on CPAP 05/02/2015  . Hemorrhagic cystitis 01/29/2015  . Sepsis ([place]) 01/29/2015  . Flank pain 01/29/2015  . Sinus tachycardia 01/29/2015  . Lymphedema 01/29/2015  . Morbid obesity with body mass index of 50.0-59.9 in adult [facility]) 01/25/2015  . Obesity hypoventilation syndrome ([place]) 01/25/2015  . Snoring   . High  cholesterol   . TIA (transient ischemic attack) 01/10/2015  . Morbid obesity ([place]) 01/10/2015  . Encephalopathy acute 01/09/2015  . HTN (hypertension) 01/09/2015  . Borderline diabetic 01/09/2015  . GERD (gastroesophageal reflux disease) 01/09/2015  . Acute encephalopathy      Past Medical History:  Diagnosis Date  . Cellulitis   . Cirrhosis, nonalcoholic ([place])   . GERD (gastroesophageal reflux disease)   . Headache    "maybe monthly" (12/19/2016)  . Hepatic encephalopathy ([place]) ~ 2016 X 2   "first time they thought it was a TIA; dx'd hepatic encephalopathy the 2nd time it happened"  . High cholesterol   . Hypertension   . Kidney stones 01/2015   UTI  . Migraine    "none in years; used to have them frequently" (12/19/2016)  . Obesity   . OSA on CPAP   . Pre-diabetes   . Snoring   . Venous stasis      Past Surgical History:  Procedure Laterality Date  . ESOPHAGOGASTRODUODENOSCOPY (EGD) WITH PROPOFOL N/A 07/26/2015   Procedure: ESOPHAGOGASTRODUODENOSCOPY (EGD) WITH PROPOFOL;  Surgeon: Arta Silence, MD;  Location: [facility] ENDOSCOPY;  Service: Endoscopy;  Laterality: N/A;  . TEE WITHOUT CARDIOVERSION N/A 04/03/2015   Procedure: TRANSESOPHAGEAL ECHOCARDIOGRAM (TEE);  Surgeon: Adrian Prows, MD;  Location: [facility] ENDOSCOPY;  Service: Cardiovascular;  Laterality: N/A;  . WISDOM TOOTH EXTRACTION  1990     Family History  Problem Relation Age of Onset  . Leukemia Mother   . Diabetes Father   .  Heart disease Maternal Grandmother   . Heart disease Paternal Grandmother   . Dementia Paternal Grandmother   . Diabetes Paternal Grandmother   . Frontotemporal dementia Paternal Grandmother   . COPD Maternal Grandfather   . Heart disease Paternal Grandfather   . Diabetes Paternal Grandfather      History  Drug Use No     History  Alcohol Use  . 0.6 - 1.2 oz/week  . 1 - 2 Standard drinks or equivalent per week    Comment: once a year     History  Smoking Status  . Never Smoker   Smokeless Tobacco  . Never Used     Outpatient Encounter Prescriptions as of 09/18/2017  Medication Sig  . acetaminophen (TYLENOL) 500 MG tablet Take 1,000 mg by mouth every 6 (six) hours as needed for headache.  Marland Kitchen aspirin EC 81 MG EC tablet Take 1 tablet (81 mg total) by mouth daily.  Marland Kitchen atorvastatin (LIPITOR) 20 MG tablet Take 1 tablet (20 mg total) by mouth at bedtime.  . blood glucose meter kit and supplies KIT Dispense based on patient and insurance preference. Use up to four times daily as directed. (FOR ICD-9 250.00, 250.01).  Marland Kitchen dexlansoprazole (DEXILANT) 60 MG capsule Take 60 mg by mouth daily.  . furosemide (LASIX) 40 MG tablet TAKE 1 TABLET BY MOUTH DAILY.  Marland Kitchen lactulose, encephalopathy, (CHRONULAC) 10 GM/15ML SOLN Take 45 mls by mouth 3 times a day  . loratadine (CLARITIN) 10 MG tablet Take 10 mg by mouth daily as needed for allergies.  . Multiple Vitamin (MULTIVITAMIN WITH MINERALS) TABS tablet Take 1 tablet by mouth daily.  . penicillin v potassium (VEETID) 500 MG tablet Take 500 mg by mouth 4 (four) times daily.   . rifaximin (XIFAXAN) 550 MG TABS tablet Take 1 tablet (550 mg total) by mouth 2 (two) times daily.  Marland Kitchen saccharomyces boulardii (FLORASTOR) 250 MG capsule Take 1 capsule (250 mg total) by mouth 2 (two) times daily.  . sertraline (ZOLOFT) 50 MG tablet Take 1 tablet by mouth daily.  . Vitamin D, Ergocalciferol, (DRISDOL) 50000 units CAPS capsule Take 1 capsule (50,000 Units total) by mouth every 7 (seven) days.   No facility-administered encounter medications on file as of 09/18/2017.     Allergies: Patient has no known allergies.  There is no height or weight on file to calculate BMI.  There were no vitals taken for this visit.     Review of Systems  Constitutional: Positive for activity change and fatigue. Negative for appetite change, chills and diaphoresis.  Respiratory: Negative for cough, chest tightness, shortness of breath, wheezing and stridor.    Cardiovascular: Positive for leg swelling. Negative for chest pain and palpitations.  Musculoskeletal: Positive for arthralgias, back pain, gait problem, joint swelling and myalgias.  Skin: Negative for color change, pallor, rash and wound.       Objective:   Physical Exam  Constitutional: He is oriented to person, place, and time. He appears well-developed and well-nourished. No distress.  HENT:  Head: Normocephalic and atraumatic.  Eyes: Pupils are equal, round, and reactive to light. Conjunctivae are normal.  Neck: Normal range of motion.  Cardiovascular: Normal rate, normal heart sounds and intact distal pulses.   Pulmonary/Chest: Effort normal and breath sounds normal. No respiratory distress. He has no wheezes. He has no rales. He exhibits no tenderness.  Abdominal: Soft. Bowel sounds are normal. He exhibits no distension and no mass. There is no tenderness. There is no rebound,  no guarding and no CVA tenderness. Hernia confirmed negative in the right inguinal area and confirmed negative in the left inguinal area.  Genitourinary: Rectum normal, prostate normal, testes normal and penis normal. Rectal exam shows guaiac negative stool. No penile tenderness.  Genitourinary Comments: Molluscum Contagiosum noted around anus and inner thighs.  Musculoskeletal: He exhibits edema and tenderness.       Right knee: He exhibits decreased range of motion and swelling.       Left knee: He exhibits decreased range of motion and swelling.       Right ankle: He exhibits decreased range of motion and swelling. He exhibits normal pulse.       Left ankle: He exhibits decreased range of motion. He exhibits normal pulse.       Right lower leg: He exhibits tenderness, swelling and edema.       Left lower leg: He exhibits tenderness, swelling and edema.       Right foot: There is decreased range of motion and swelling.       Left foot: There is decreased range of motion and swelling.  Substantial edema of  lower extremity from knees to toes. From mid shin to ankle-very reddened, warm, thickened tissue. No open tissue or active drainage. Pedal pulses palpable but VERY difficult to feel due to edema. Pitting edema on tops of feet.  Lymphadenopathy:    He has no cervical adenopathy.       Right: No inguinal adenopathy present.       Left: No inguinal adenopathy present.  Neurological: He is alert and oriented to person, place, and time. Coordination normal.  Skin: Skin is warm and dry. He is not diaphoretic.  Psychiatric: He has a normal mood and affect. His behavior is normal. Judgment and thought content normal.  Nursing note and vitals reviewed.         Assessment & Plan:   No diagnosis found.  No problem-specific Assessment & Plan notes found for this encounter.    FOLLOW-UP:  No Follow-up on file.

## 2017-09-16 ENCOUNTER — Other Ambulatory Visit: Payer: BLUE CROSS/BLUE SHIELD

## 2017-09-16 ENCOUNTER — Other Ambulatory Visit: Payer: Self-pay | Admitting: Gastroenterology

## 2017-09-16 DIAGNOSIS — K7469 Other cirrhosis of liver: Secondary | ICD-10-CM

## 2017-09-16 DIAGNOSIS — R5383 Other fatigue: Secondary | ICD-10-CM

## 2017-09-16 DIAGNOSIS — R7303 Prediabetes: Secondary | ICD-10-CM

## 2017-09-16 DIAGNOSIS — D696 Thrombocytopenia, unspecified: Secondary | ICD-10-CM

## 2017-09-16 DIAGNOSIS — K7460 Unspecified cirrhosis of liver: Secondary | ICD-10-CM

## 2017-09-16 DIAGNOSIS — E559 Vitamin D deficiency, unspecified: Secondary | ICD-10-CM

## 2017-09-16 DIAGNOSIS — E7800 Pure hypercholesterolemia, unspecified: Secondary | ICD-10-CM

## 2017-09-16 DIAGNOSIS — I10 Essential (primary) hypertension: Secondary | ICD-10-CM

## 2017-09-17 ENCOUNTER — Other Ambulatory Visit: Payer: Self-pay | Admitting: Adult Health

## 2017-09-17 DIAGNOSIS — D696 Thrombocytopenia, unspecified: Secondary | ICD-10-CM

## 2017-09-17 LAB — CBC WITH DIFFERENTIAL/PLATELET
BASOS ABS: 0.1 10*3/uL (ref 0.0–0.2)
Basos: 3 %
EOS (ABSOLUTE): 0 10*3/uL (ref 0.0–0.4)
EOS: 0 %
HEMOGLOBIN: 11.8 g/dL — AB (ref 13.0–17.7)
Hematocrit: 35.9 % — ABNORMAL LOW (ref 37.5–51.0)
IMMATURE GRANS (ABS): 0 10*3/uL (ref 0.0–0.1)
Immature Granulocytes: 1 %
LYMPHS: 35 %
Lymphocytes Absolute: 1.1 10*3/uL (ref 0.7–3.1)
MCH: 32.2 pg (ref 26.6–33.0)
MCHC: 32.9 g/dL (ref 31.5–35.7)
MCV: 98 fL — ABNORMAL HIGH (ref 79–97)
MONOCYTES: 17 %
Monocytes Absolute: 0.5 10*3/uL (ref 0.1–0.9)
NEUTROS ABS: 1.4 10*3/uL (ref 1.4–7.0)
Neutrophils: 44 %
PLATELETS: 65 10*3/uL — AB (ref 150–379)
RBC: 3.67 x10E6/uL — AB (ref 4.14–5.80)
RDW: 16.6 % — ABNORMAL HIGH (ref 12.3–15.4)
WBC: 3.1 10*3/uL — AB (ref 3.4–10.8)

## 2017-09-17 LAB — COMPREHENSIVE METABOLIC PANEL
ALT: 21 IU/L (ref 0–44)
AST: 51 IU/L — ABNORMAL HIGH (ref 0–40)
Albumin/Globulin Ratio: 0.6 — ABNORMAL LOW (ref 1.2–2.2)
Albumin: 2.1 g/dL — ABNORMAL LOW (ref 3.5–5.5)
Alkaline Phosphatase: 103 IU/L (ref 39–117)
BUN/Creatinine Ratio: 16 (ref 9–20)
BUN: 9 mg/dL (ref 6–24)
Bilirubin Total: 1.6 mg/dL — ABNORMAL HIGH (ref 0.0–1.2)
CALCIUM: 7.8 mg/dL — AB (ref 8.7–10.2)
CO2: 19 mmol/L — AB (ref 20–29)
CREATININE: 0.58 mg/dL — AB (ref 0.76–1.27)
Chloride: 109 mmol/L — ABNORMAL HIGH (ref 96–106)
GFR, EST AFRICAN AMERICAN: 141 mL/min/{1.73_m2} (ref >59)
GFR, EST NON AFRICAN AMERICAN: 122 mL/min/{1.73_m2} (ref >59)
GLUCOSE: 85 mg/dL (ref 65–99)
Globulin, Total: 3.6 g/dL (ref 1.5–4.5)
Potassium: 3.9 mmol/L (ref 3.5–5.2)
Sodium: 139 mmol/L (ref 134–144)
TOTAL PROTEIN: 5.7 g/dL — AB (ref 6.0–8.5)

## 2017-09-17 LAB — HEMOGLOBIN A1C
Est. average glucose Bld gHb Est-mCnc: 103 mg/dL
Hgb A1c MFr Bld: 5.2 % (ref 4.8–5.6)

## 2017-09-17 LAB — LIPID PANEL
Chol/HDL Ratio: 2.9 ratio (ref 0.0–5.0)
Cholesterol, Total: 102 mg/dL (ref 100–199)
HDL: 35 mg/dL — AB (ref >39)
LDL Calculated: 52 mg/dL (ref 0–99)
TRIGLYCERIDES: 73 mg/dL (ref 0–149)
VLDL Cholesterol Cal: 15 mg/dL (ref 5–40)

## 2017-09-17 LAB — VITAMIN D 25 HYDROXY (VIT D DEFICIENCY, FRACTURES): VIT D 25 HYDROXY: 12.3 ng/mL — AB (ref 30.0–100.0)

## 2017-09-17 LAB — TSH: TSH: 2.7 u[IU]/mL (ref 0.450–4.500)

## 2017-09-17 NOTE — Progress Notes (Unsigned)
Plt 65, level 72 eight months  Will discuss with pt at [facility] tomorrow Hematology referral placed

## 2017-09-18 ENCOUNTER — Ambulatory Visit: Payer: BLUE CROSS/BLUE SHIELD | Admitting: Adult Health

## 2017-09-22 ENCOUNTER — Ambulatory Visit (INDEPENDENT_AMBULATORY_CARE_PROVIDER_SITE_OTHER): Payer: BLUE CROSS/BLUE SHIELD | Admitting: Adult Health

## 2017-09-22 ENCOUNTER — Encounter: Payer: Self-pay | Admitting: Adult Health

## 2017-09-22 VITALS — BP 154/79 | HR 76 | Ht 76.0 in | Wt >= 6400 oz

## 2017-09-22 DIAGNOSIS — Z6841 Body Mass Index (BMI) 40.0 and over, adult: Secondary | ICD-10-CM | POA: Diagnosis not present

## 2017-09-22 DIAGNOSIS — D696 Thrombocytopenia, unspecified: Secondary | ICD-10-CM | POA: Diagnosis not present

## 2017-09-22 DIAGNOSIS — I10 Essential (primary) hypertension: Secondary | ICD-10-CM | POA: Diagnosis not present

## 2017-09-22 DIAGNOSIS — E7800 Pure hypercholesterolemia, unspecified: Secondary | ICD-10-CM | POA: Diagnosis not present

## 2017-09-22 DIAGNOSIS — Z23 Encounter for immunization: Secondary | ICD-10-CM | POA: Diagnosis not present

## 2017-09-22 MED ORDER — LISINOPRIL 10 MG PO TABS: 10.0000 mg | ORAL_TABLET | Freq: Every day | ORAL | 3 refills | Status: DC

## 2017-09-22 MED ORDER — VITAMIN D (ERGOCALCIFEROL) 1.25 MG (50000 UNIT) PO CAPS: 50000.0000 [IU] | ORAL_CAPSULE | ORAL | 0 refills | Status: DC

## 2017-09-22 NOTE — Progress Notes (Signed)
Subjective:    Patient ID: Ryan Burgess, male    DOB: [birth year], 46 y.o.   MRN: [id number]  04/22/2017 OV Notes: Hypertension  Pertinent negatives include no chest pain, palpitations or shortness of breath.  :  Ryan Burgess is here for CPE.  He is compliant on all medications and denies SE.  He is followed by GI (Encephalopathy, hepatic cirrhosis), and ID (bil lower ext cellulitis).  He has lost 20 lbs since last OV, mostly r/t fluid reduction of lower extremities (he estimates edema has reduced > 30% since onset of acute exacerbation).  He reports anxiety/depression has improved on Sertraline '50mg'$  daily.  He denies thoughts of harming himself/others.  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  He is very vague about his diet and feels that his goal wt is around 300 lbs.  He continues to struggle walking even short distances.   Today's OV Notes 09/22/2017: [name] is here for regular f/u: HTN, HL, morbid obesity, and depression.  He is compliant on all medications and denies SE.   He denies acute cardiac/respiratory sx's.  He has gained 33 lbs since last OV in July 2018- due to lack of regular movement and diet high in fat/CHO.  He has been released from wound care and denies any active infection- he reports taking PCN prophylactically- '500mg'$  BID (was previously taking Q6H during tx for active infection).  He needs to schedule f/u with ID. He reports that a stationary bike is being delivered today and he plans on riding several days a week-GREAT! He continues to avoid tobacco/ETOH use. He is still interested in referral to Bariatric surgery.  Patient Care Team    Relationship Specialty Notifications Start End  Mina Marble D, NP PCP - General Family Medicine  01/16/17   Christin Fudge, MD Consulting Physician Surgery  01/16/17   Arta Silence, MD Consulting Physician Gastroenterology  01/16/17     Patient Active Problem List   Diagnosis Date Noted  .  Cellulitis of leg, left 01/16/2017  . Cellulitis of leg, right 12/23/2016  . AKI (acute kidney injury) ([place]) 12/19/2016  . Hyponatremia 12/19/2016  . Pressure injury of skin 12/19/2016  . Encephalopathy, hepatic ([place]) 11/20/2015  . Hepatic cirrhosis ([place]) 11/20/2015  . Thrombocytopenia ([place])   . Disorientation 11/19/2015  . Stroke or transient ischemic attack (TIA) diagnosed during current admission 11/19/2015  . OSA on CPAP 05/02/2015  . Hemorrhagic cystitis 01/29/2015  . Sepsis ([place]) 01/29/2015  . Flank pain 01/29/2015  . Sinus tachycardia 01/29/2015  . Lymphedema 01/29/2015  . Morbid obesity with body mass index of 50.0-59.9 in adult [facility]) 01/25/2015  . Obesity hypoventilation syndrome ([place]) 01/25/2015  . Snoring   . High cholesterol   . TIA (transient ischemic attack) 01/10/2015  . Morbid obesity ([place]) 01/10/2015  . Encephalopathy acute 01/09/2015  . HTN (hypertension) 01/09/2015  . Borderline diabetic 01/09/2015  . GERD (gastroesophageal reflux disease) 01/09/2015  . Acute encephalopathy      Past Medical History:  Diagnosis Date  . Cellulitis   . Cirrhosis, nonalcoholic ([place])   . GERD (gastroesophageal reflux disease)   . Headache    "maybe monthly" (12/19/2016)  . Hepatic encephalopathy ([place]) ~ 2016 X 2   "first time they thought it was a TIA; dx'd hepatic encephalopathy the 2nd time it happened"  . High cholesterol   . Hypertension   . Kidney stones 01/2015   UTI  . Migraine    "none  in years; used to have them frequently" (12/19/2016)  . Obesity   . OSA on CPAP   . Pre-diabetes   . Snoring   . Venous stasis      Past Surgical History:  Procedure Laterality Date  . ESOPHAGOGASTRODUODENOSCOPY (EGD) WITH PROPOFOL N/A 07/26/2015   Procedure: ESOPHAGOGASTRODUODENOSCOPY (EGD) WITH PROPOFOL;  Surgeon: Arta Silence, MD;  Location: [facility] ENDOSCOPY;  Service: Endoscopy;  Laterality: N/A;  . TEE WITHOUT CARDIOVERSION N/A 04/03/2015   Procedure: TRANSESOPHAGEAL  ECHOCARDIOGRAM (TEE);  Surgeon: Adrian Prows, MD;  Location: [facility] ENDOSCOPY;  Service: Cardiovascular;  Laterality: N/A;  . WISDOM TOOTH EXTRACTION  1990     Family History  Problem Relation Age of Onset  . Leukemia Mother   . Diabetes Father   . Heart disease Maternal Grandmother   . Heart disease Paternal Grandmother   . Dementia Paternal Grandmother   . Diabetes Paternal Grandmother   . Frontotemporal dementia Paternal Grandmother   . COPD Maternal Grandfather   . Heart disease Paternal Grandfather   . Diabetes Paternal Grandfather      History  Drug Use No     History  Alcohol Use  . 0.6 - 1.2 oz/week  . 1 - 2 Standard drinks or equivalent per week    Comment: once a year     History  Smoking Status  . Never Smoker  Smokeless Tobacco  . Never Used     Outpatient Encounter Prescriptions as of 09/22/2017  Medication Sig  . acetaminophen (TYLENOL) 500 MG tablet Take 1,000 mg by mouth every 6 (six) hours as needed for headache.  Marland Kitchen aspirin EC 81 MG EC tablet Take 1 tablet (81 mg total) by mouth daily.  Marland Kitchen atorvastatin (LIPITOR) 20 MG tablet Take 1 tablet (20 mg total) by mouth at bedtime.  . blood glucose meter kit and supplies KIT Dispense based on patient and insurance preference. Use up to four times daily as directed. (FOR ICD-9 250.00, 250.01).  Marland Kitchen dexlansoprazole (DEXILANT) 60 MG capsule Take 60 mg by mouth daily.  . furosemide (LASIX) 40 MG tablet TAKE 1 TABLET BY MOUTH DAILY.  Marland Kitchen lactulose, encephalopathy, (CHRONULAC) 10 GM/15ML SOLN Take 45 mls by mouth 3 times a day  . loratadine (CLARITIN) 10 MG tablet Take 10 mg by mouth daily as needed for allergies.  . Multiple Vitamin (MULTIVITAMIN WITH MINERALS) TABS tablet Take 1 tablet by mouth daily.  . penicillin v potassium (VEETID) 500 MG tablet Take 500 mg by mouth 2 (two) times daily.  . rifaximin (XIFAXAN) 550 MG TABS tablet Take 1 tablet (550 mg total) by mouth 2 (two) times daily.  Marland Kitchen saccharomyces boulardii  (FLORASTOR) 250 MG capsule Take 1 capsule (250 mg total) by mouth 2 (two) times daily.  . sertraline (ZOLOFT) 50 MG tablet Take 1 tablet by mouth daily.  . [DISCONTINUED] penicillin v potassium (VEETID) 500 MG tablet Take 500 mg by mouth 4 (four) times daily.   . [DISCONTINUED] Vitamin D, Ergocalciferol, (DRISDOL) 50000 units CAPS capsule Take 1 capsule (50,000 Units total) by mouth every 7 (seven) days.  Marland Kitchen lisinopril (PRINIVIL,ZESTRIL) 10 MG tablet Take 1 tablet (10 mg total) by mouth daily.  . Vitamin D, Ergocalciferol, (DRISDOL) 50000 units CAPS capsule Take 1 capsule (50,000 Units total) by mouth every 7 (seven) days.   No facility-administered encounter medications on file as of 09/22/2017.     Allergies: Patient has no known allergies.  Body mass index is 63.1 kg/m.  Blood pressure (!) 154/79, pulse 76,  height '6\' 4"'$  (1.93 m), weight (!) 518 lb 6.4 oz (235.1 kg).     Review of Systems  Constitutional: Positive for activity change and fatigue. Negative for appetite change, chills and diaphoresis.  Respiratory: Negative for cough, chest tightness, shortness of breath, wheezing and stridor.   Cardiovascular: Positive for leg swelling. Negative for chest pain and palpitations.  Musculoskeletal: Positive for arthralgias, back pain, gait problem, joint swelling and myalgias.  Skin: Negative for color change, pallor, rash and wound.       Objective:   Physical Exam  Constitutional: He is oriented to person, place, and time. He appears well-developed and well-nourished. No distress.  HENT:  Head: Normocephalic and atraumatic.  Eyes: Pupils are equal, round, and reactive to light. Conjunctivae are normal.  Neck: Normal range of motion.  Cardiovascular: Normal rate, normal heart sounds and intact distal pulses.   Pulmonary/Chest: Effort normal and breath sounds normal. No respiratory distress. He has no wheezes. He has no rales. He exhibits no tenderness.  Abdominal: Soft. Bowel  sounds are normal. He exhibits no distension and no mass. There is no tenderness. There is no rebound, no guarding and no CVA tenderness. Hernia confirmed negative in the right inguinal area and confirmed negative in the left inguinal area.  Genitourinary: Rectum normal, prostate normal, testes normal and penis normal. Rectal exam shows guaiac negative stool. No penile tenderness.  Genitourinary Comments: Molluscum Contagiosum noted around anus and inner thighs.  Musculoskeletal: He exhibits edema and tenderness.       Right knee: He exhibits decreased range of motion and swelling.       Left knee: He exhibits decreased range of motion and swelling.       Right ankle: He exhibits decreased range of motion and swelling. He exhibits normal pulse.       Left ankle: He exhibits decreased range of motion. He exhibits normal pulse.       Right lower leg: He exhibits tenderness, swelling and edema.       Left lower leg: He exhibits tenderness, swelling and edema.       Right foot: There is decreased range of motion and swelling.       Left foot: There is decreased range of motion and swelling.  Substantial edema of lower extremity from knees to toes. From mid shin to ankle-very reddened, warm, thickened tissue. No open tissue or active drainage. Pedal pulses palpable but VERY difficult to feel due to edema. Pitting edema on tops of feet.  Lymphadenopathy:    He has no cervical adenopathy.       Right: No inguinal adenopathy present.       Left: No inguinal adenopathy present.  Neurological: He is alert and oriented to person, place, and time. Coordination normal.  Skin: Skin is warm and dry. He is not diaphoretic.  Psychiatric: He has a normal mood and affect. His behavior is normal. Judgment and thought content normal.  Nursing note and vitals reviewed.         Assessment & Plan:   1. Need for influenza vaccination   2. Morbid obesity ([place])   3. Hypertension, unspecified type   4.  Thrombocytopenia ([place])   5. Morbid obesity with body mass index of 50.0-59.9 in adult ([place])   6. High cholesterol     HTN (hypertension) BP above goal last 2 OVs Currently only taking Furosemide '40mg'$  Started on Lisinopril '10mg'$  daily-check BP each day and f/u in 4 weeks.  Thrombocytopenia [facility]) Hematology  referral placed 09/17/17 He denies blood in urine/stool or prolonged bleeding time. He denies ever been told of low WBCs or Plt's.  Morbid obesity with body mass index of 50.0-59.9 in adult ([place]) Wt 06/05/2017- 485 Wt today 518, wt increase of 33 lbs! He denies dyspnea at rest. He reports drinking >gallon water/day. His diet is high in saturated fat/sugar/CHO and only activity that he gets is walking around house. He reports that a stationary bike is being delivered to home tonight and plans on using it most days of the week. Bariatric referral placed.    High cholesterol 09/16/2017  Units 6d ago   Cholesterol, Total 100 - 199 mg/dL 102   Triglycerides 0 - 149 mg/dL 73   HDL >39 mg/dL 35    VLDL Cholesterol Cal 5 - 40 mg/dL 15   LDL Calculated 0 - 99 mg/dL 52   Chol/HDL Ratio 0.0 - 5.0 ratio 2.9   Comment:                 T. Chol/HDL Ratio                          Continue Atorvastatin '20mg'$  daily and follow heart healthy diet. Needs to drastically increase regular movement to improve HDL and achieve wt loss.     FOLLOW-UP:  Return in about 4 weeks (around 10/20/2017) for Regular Follow Up, HTN, Evaluate Medication Effectiveness.

## 2017-09-22 NOTE — Assessment & Plan Note (Signed)
Hematology referral placed 09/17/17 He denies blood in urine/stool or prolonged bleeding time. He denies ever been told of low WBCs or Plt's.

## 2017-09-22 NOTE — Patient Instructions (Addendum)
Heart-Healthy Eating Plan Many factors influence your heart health, including eating and exercise habits. Heart (coronary) risk increases with abnormal blood fat (lipid) levels. Heart-healthy meal planning includes limiting unhealthy fats, increasing healthy fats, and making other small dietary changes. This includes maintaining a healthy body weight to help keep lipid levels within a normal range. What is my plan? Your health care provider recommends that you:  Get no more than __25__% of the total calories in your daily diet from fat.  Limit your intake of saturated fat to less than __5__% of your total calories each day.  Limit the amount of cholesterol in your diet to less than __300__ mg per day.  What types of fat should I choose?  Choose healthy fats more often. Choose monounsaturated and polyunsaturated fats, such as olive oil and canola oil, flaxseeds, walnuts, almonds, and seeds.  Eat more omega-3 fats. Good choices include salmon, mackerel, sardines, tuna, flaxseed oil, and ground flaxseeds. Aim to eat fish at least two times each week.  Limit saturated fats. Saturated fats are primarily found in animal products, such as meats, butter, and cream. Plant sources of saturated fats include palm oil, palm kernel oil, and coconut oil.  Avoid foods with partially hydrogenated oils in them. These contain trans fats. Examples of foods that contain trans fats are stick margarine, some tub margarines, cookies, crackers, and other baked goods. What general guidelines do I need to follow?  Check food labels carefully to identify foods with trans fats or high amounts of saturated fat.  Fill one half of your plate with vegetables and green salads. Eat 4-5 servings of vegetables per day. A serving of vegetables equals 1 cup of raw leafy vegetables,  cup of raw or cooked cut-up vegetables, or  cup of vegetable juice.  Fill one fourth of your plate with whole grains. Look for the word "whole" as  the first word in the ingredient list.  Fill one fourth of your plate with lean protein foods.  Eat 4-5 servings of fruit per day. A serving of fruit equals one medium whole fruit,  cup of dried fruit,  cup of fresh, frozen, or canned fruit, or  cup of 100% fruit juice.  Eat more foods that contain soluble fiber. Examples of foods that contain this type of fiber are apples, broccoli, carrots, beans, peas, and barley. Aim to get 20-30 g of fiber per day.  Eat more home-cooked food and less restaurant, buffet, and fast food.  Limit or avoid alcohol.  Limit foods that are high in starch and sugar.  Avoid fried foods.  Cook foods by using methods other than frying. Baking, boiling, grilling, and broiling are all great options. Other fat-reducing suggestions include: ? Removing the skin from poultry. ? Removing all visible fats from meats. ? Skimming the fat off of stews, soups, and gravies before serving them. ? Steaming vegetables in water or broth.  Lose weight if you are overweight. Losing just 5-10% of your initial body weight can help your overall health and prevent diseases such as diabetes and heart disease.  Increase your consumption of nuts, legumes, and seeds to 4-5 servings per week. One serving of dried beans or legumes equals  cup after being cooked, one serving of nuts equals 1 ounces, and one serving of seeds equals  ounce or 1 tablespoon.  You may need to monitor your salt (sodium) intake, especially if you have high blood pressure. Talk with your health care provider or dietitian to get  more information about reducing sodium. What foods can I eat? Grains  Breads, including [place], white, pita, wheat, raisin, rye, oatmeal, and [place]. Tortillas that are neither fried nor made with lard or trans fat. Low-fat rolls, including hotdog and hamburger buns and English muffins. Biscuits. Muffins. Waffles. Pancakes. Light popcorn. Whole-grain cereals. Flatbread. Melba toast.  Pretzels. Breadsticks. Rusks. Low-fat snacks and crackers, including oyster, saltine, matzo, graham, animal, and rye. Rice and pasta, including brown rice and those that are made with whole wheat. Vegetables All vegetables. Fruits All fruits, but limit coconut. Meats and Other Protein Sources Lean, well-trimmed beef, veal, pork, and lamb. Chicken and [place] without skin. All fish and shellfish. Wild duck, rabbit, pheasant, and venison. Egg whites or low-cholesterol egg substitutes. Dried beans, peas, lentils, and tofu.Seeds and most nuts. Dairy Low-fat or nonfat cheeses, including ricotta, string, and mozzarella. Skim or 1% milk that is liquid, powdered, or evaporated. Buttermilk that is made with low-fat milk. Nonfat or low-fat yogurt. Beverages Mineral water. Diet carbonated beverages. Sweets and Desserts Sherbets and fruit ices. Honey, jam, marmalade, jelly, and syrups. Meringues and gelatins. Pure sugar candy, such as hard candy, jelly beans, gumdrops, mints, marshmallows, and small amounts of dark chocolate. [employer] Eat all sweets and desserts in moderation. Fats and Oils Nonhydrogenated (trans-free) margarines. Vegetable oils, including soybean, sesame, sunflower, olive, peanut, safflower, corn, canola, and cottonseed. Salad dressings or mayonnaise that are made with a vegetable oil. Limit added fats and oils that you use for cooking, baking, salads, and as spreads. Other Cocoa powder. Coffee and tea. All seasonings and condiments. The items listed above may not be a complete list of recommended foods or beverages. Contact your dietitian for more options. What foods are not recommended? Grains Breads that are made with saturated or trans fats, oils, or whole milk. Croissants. Butter rolls. Cheese breads. Sweet rolls. Donuts. Buttered popcorn. Chow mein noodles. High-fat crackers, such as cheese or butter crackers. Meats and Other Protein Sources Fatty meats, such as hotdogs,  short ribs, sausage, spareribs, bacon, ribeye roast or steak, and mutton. High-fat deli meats, such as salami and bologna. Caviar. Domestic duck and goose. Organ meats, such as kidney, liver, sweetbreads, brains, gizzard, chitterlings, and heart. Dairy Cream, sour cream, cream cheese, and creamed cottage cheese. Whole milk cheeses, including blue (bleu), Monte[place] Jack, [place], [place], [place], [place], Swiss, [place], [place], and [place]. Whole or 2% milk that is liquid, evaporated, or condensed. Whole buttermilk. Cream sauce or high-fat cheese sauce. Yogurt that is made from whole milk. Beverages Regular sodas and drinks with added sugar. Sweets and Desserts Frosting. Pudding. Cookies. Cakes other than angel food cake. Candy that has milk chocolate or white chocolate, hydrogenated fat, butter, coconut, or unknown ingredients. Buttered syrups. Full-fat ice cream or ice cream drinks. Fats and Oils Gravy that has suet, meat fat, or shortening. Cocoa butter, hydrogenated oils, palm oil, coconut oil, palm kernel oil. These can often be found in baked products, candy, fried foods, nondairy creamers, and whipped toppings. Solid fats and shortenings, including bacon fat, salt pork, lard, and butter. Nondairy cream substitutes, such as coffee creamers and sour cream substitutes. Salad dressings that are made of unknown oils, cheese, or sour cream. The items listed above may not be a complete list of foods and beverages to avoid. Contact your dietitian for more information. This information is not intended to replace advice given to you by your health care provider. Make sure you discuss any questions you have with your health care  provider. Document Released: 08/27/2008 Document Revised: 06/07/2016 Document Reviewed: 05/12/2014 Elsevier Interactive Patient Education  2017 [place] all medications as directed. Increase water intake, strive for at least >1 gallon/day. Follow Heart Healthy  diet Increase regular exercise.  Recommend at least 30 minutes daily, 5 days per week of walking, stationary biking, swimming, YouTube/Pinterest workout videos. It is imperative to move more and focus on eating healthy for weight reduction-Bariatric Surgery referral placed. Due to low plateletes and white blood count-referral to Hematology placed. Blood pressure has been above goal the last 2 office visits-please start Lisinopril 10mg  daily and check blood pressure daily. Follow-up in 4 weeks to evaluate effectiveness of Lisinopril 10mg . NICE TO SEE YOU!

## 2017-09-22 NOTE — Assessment & Plan Note (Signed)
BP above goal last 2 OVs Currently only taking Furosemide 40mg  Started on Lisinopril 10mg  daily-check BP each day and f/u in 4 weeks.

## 2017-09-22 NOTE — Assessment & Plan Note (Signed)
09/16/2017  Units 6d ago   Cholesterol, Total 100 - 199 mg/dL 102   Triglycerides 0 - 149 mg/dL 73   HDL >39 mg/dL 35    VLDL Cholesterol Cal 5 - 40 mg/dL 15   LDL Calculated 0 - 99 mg/dL 52   Chol/HDL Ratio 0.0 - 5.0 ratio 2.9   Comment:                 T. Chol/HDL Ratio                          Continue Atorvastatin 20mg  daily and follow heart healthy diet. Needs to drastically increase regular movement to improve HDL and achieve wt loss.

## 2017-09-22 NOTE — Assessment & Plan Note (Signed)
Wt 06/05/2017- 485 Wt today 518, wt increase of 33 lbs! He denies dyspnea at rest. He reports drinking >gallon water/day. His diet is high in saturated fat/sugar/CHO and only activity that he gets is walking around house. He reports that a stationary bike is being delivered to home tonight and plans on using it most days of the week. Bariatric referral placed.

## 2017-09-24 ENCOUNTER — Other Ambulatory Visit: Payer: BLUE CROSS/BLUE SHIELD

## 2017-10-01 ENCOUNTER — Ambulatory Visit
Admission: RE | Admit: 2017-10-01 | Discharge: 2017-10-01 | Disposition: A | Discharge status: Home / Self Care | Payer: BLUE CROSS/BLUE SHIELD | Source: Ambulatory Visit | Attending: Gastroenterology | Admitting: Gastroenterology

## 2017-10-01 DIAGNOSIS — K7460 Unspecified cirrhosis of liver: Secondary | ICD-10-CM

## 2017-10-09 ENCOUNTER — Encounter: Payer: Self-pay | Admitting: Hematology and Oncology

## 2017-10-16 ENCOUNTER — Ambulatory Visit (INDEPENDENT_AMBULATORY_CARE_PROVIDER_SITE_OTHER): Payer: BLUE CROSS/BLUE SHIELD | Admitting: Adult Health

## 2017-10-16 ENCOUNTER — Encounter: Payer: Self-pay | Admitting: Adult Health

## 2017-10-16 VITALS — BP 148/72 | HR 80 | Temp 97.9°F | Ht 76.0 in | Wt >= 6400 oz

## 2017-10-16 DIAGNOSIS — J069 Acute upper respiratory infection, unspecified: Secondary | ICD-10-CM | POA: Diagnosis not present

## 2017-10-16 DIAGNOSIS — I10 Essential (primary) hypertension: Secondary | ICD-10-CM

## 2017-10-16 DIAGNOSIS — Z0000 Encounter for general adult medical examination without abnormal findings: Secondary | ICD-10-CM | POA: Diagnosis not present

## 2017-10-16 DIAGNOSIS — Z7189 Other specified counseling: Secondary | ICD-10-CM | POA: Insufficient documentation

## 2017-10-16 MED ORDER — LISINOPRIL 20 MG PO TABS: 10.0000 mg | ORAL_TABLET | Freq: Every day | ORAL | 2 refills | Status: DC

## 2017-10-16 MED ORDER — AZITHROMYCIN 250 MG PO TABS: ORAL_TABLET | ORAL | 0 refills | Status: DC

## 2017-10-16 MED ORDER — BENZONATATE 200 MG PO CAPS: 200.0000 mg | ORAL_CAPSULE | Freq: Two times a day (BID) | ORAL | 0 refills | Status: DC | PRN

## 2017-10-16 NOTE — Assessment & Plan Note (Addendum)
Above goal 148/72, HR 80 Home readings SBP 140s, DBP 70-70s He denies CP/palpitations/dyspnea at rest/dizziness Increased Lisinopril from 10mg  to 20 mg daily Continue furosemide 40mg  daily Unable to combine meds with current dosage Continue to check BP at home and bring readings to next OV

## 2017-10-16 NOTE — Patient Instructions (Signed)
Hypertension Hypertension, commonly called high blood pressure, is when the force of blood pumping through the arteries is too strong. The arteries are the blood vessels that carry blood from the heart throughout the body. Hypertension forces the heart to work harder to pump blood and may cause arteries to become narrow or stiff. Having untreated or uncontrolled hypertension can cause heart attacks, strokes, kidney disease, and other problems. A blood pressure reading consists of a higher number over a lower number. Ideally, your blood pressure should be below 120/80. The first ("top") number is called the systolic pressure. It is a measure of the pressure in your arteries as your heart beats. The second ("bottom") number is called the diastolic pressure. It is a measure of the pressure in your arteries as the heart relaxes. What are the causes? The cause of this condition is not known. What increases the risk? Some risk factors for high blood pressure are under your control. Others are not. Factors you can change  Smoking.  Having type 2 diabetes mellitus, high cholesterol, or both.  Not getting enough exercise or physical activity.  Being overweight.  Having too much fat, sugar, calories, or salt (sodium) in your diet.  Drinking too much alcohol. Factors that are difficult or impossible to change  Having chronic kidney disease.  Having a family history of high blood pressure.  Age. Risk increases with age.  Race. You may be at higher risk if you are African-American.  Gender. Men are at higher risk than women before age 45. After age 65, women are at higher risk than men.  Having obstructive sleep apnea.  Stress. What are the signs or symptoms? Extremely high blood pressure (hypertensive crisis) may cause:  Headache.  Anxiety.  Shortness of breath.  Nosebleed.  Nausea and vomiting.  Severe chest pain.  Jerky movements you cannot control (seizures).  How is this  diagnosed? This condition is diagnosed by measuring your blood pressure while you are seated, with your arm resting on a surface. The cuff of the blood pressure monitor will be placed directly against the skin of your upper arm at the level of your heart. It should be measured at least twice using the same arm. Certain conditions can cause a difference in blood pressure between your right and left arms. Certain factors can cause blood pressure readings to be lower or higher than normal (elevated) for a short period of time:  When your blood pressure is higher when you are in a health care provider's office than when you are at home, this is called white coat hypertension. Most people with this condition do not need medicines.  When your blood pressure is higher at home than when you are in a health care provider's office, this is called masked hypertension. Most people with this condition may need medicines to control blood pressure.  If you have a high blood pressure reading during one visit or you have normal blood pressure with other risk factors:  You may be asked to return on a different day to have your blood pressure checked again.  You may be asked to monitor your blood pressure at home for 1 week or longer.  If you are diagnosed with hypertension, you may have other blood or imaging tests to help your health care provider understand your overall risk for other conditions. How is this treated? This condition is treated by making healthy lifestyle changes, such as eating healthy foods, exercising more, and reducing your alcohol intake. Your   health care provider may prescribe medicine if lifestyle changes are not enough to get your blood pressure under control, and if:  Your systolic blood pressure is above 130.  Your diastolic blood pressure is above 80.  Your personal target blood pressure may vary depending on your medical conditions, your age, and other factors. Follow these  instructions at home: Eating and drinking  Eat a diet that is high in fiber and potassium, and low in sodium, added sugar, and fat. An example eating plan is called the DASH (Dietary Approaches to Stop Hypertension) diet. To eat this way: ? Eat plenty of fresh fruits and vegetables. Try to fill half of your plate at each meal with fruits and vegetables. ? Eat whole grains, such as whole wheat pasta, brown rice, or whole grain bread. Fill about one quarter of your plate with whole grains. ? Eat or drink low-fat dairy products, such as skim milk or low-fat yogurt. ? Avoid fatty cuts of meat, processed or cured meats, and poultry with skin. Fill about one quarter of your plate with lean proteins, such as fish, chicken without skin, beans, eggs, and tofu. ? Avoid premade and processed foods. These tend to be higher in sodium, added sugar, and fat.  Reduce your daily sodium intake. Most people with hypertension should eat less than 1,500 mg of sodium a day.  Limit alcohol intake to no more than 1 drink a day for nonpregnant women and 2 drinks a day for men. One drink equals 12 oz of beer, 5 oz of wine, or 1 oz of hard liquor. Lifestyle  Work with your health care provider to maintain a healthy body weight or to lose weight. Ask what an ideal weight is for you.  Get at least 30 minutes of exercise that causes your heart to beat faster (aerobic exercise) most days of the week. Activities may include walking, swimming, or biking.  Include exercise to strengthen your muscles (resistance exercise), such as pilates or lifting weights, as part of your weekly exercise routine. Try to do these types of exercises for 30 minutes at least 3 days a week.  Do not use any products that contain nicotine or tobacco, such as cigarettes and e-cigarettes. If you need help quitting, ask your health care provider.  Monitor your blood pressure at home as told by your health care provider.  Keep all follow-up visits as  told by your health care provider. This is important. Medicines  Take over-the-counter and prescription medicines only as told by your health care provider. Follow directions carefully. Blood pressure medicines must be taken as prescribed.  Do not skip doses of blood pressure medicine. Doing this puts you at risk for problems and can make the medicine less effective.  Ask your health care provider about side effects or reactions to medicines that you should watch for. Contact a health care provider if:  You think you are having a reaction to a medicine you are taking.  You have headaches that keep coming back (recurring).  You feel dizzy.  You have swelling in your ankles.  You have trouble with your vision. Get help right away if:  You develop a severe headache or confusion.  You have unusual weakness or numbness.  You feel faint.  You have severe pain in your chest or abdomen.  You vomit repeatedly.  You have trouble breathing. Summary  Hypertension is when the force of blood pumping through your arteries is too strong. If this condition is not   controlled, it may put you at risk for serious complications.  Your personal target blood pressure may vary depending on your medical conditions, your age, and other factors. For most people, a normal blood pressure is less than 120/80.  Hypertension is treated with lifestyle changes, medicines, or a combination of both. Lifestyle changes include weight loss, eating a healthy, low-sodium diet, exercising more, and limiting alcohol. This information is not intended to replace advice given to you by your health care provider. Make sure you discuss any questions you have with your health care provider. Document Released: 11/18/2005 Document Revised: 10/16/2016 Document Reviewed: 10/16/2016 Elsevier Interactive Patient Education  2018 [place].   Upper Respiratory Infection, Adult Most upper respiratory infections (URIs) are  caused by a virus. A URI affects the nose, throat, and upper air passages. The most common type of URI is often called "the common cold." Follow these instructions at home:  Take medicines only as told by your doctor.  Gargle warm saltwater or take cough drops to comfort your throat as told by your doctor.  Use a warm mist humidifier or inhale steam from a shower to increase air moisture. This may make it easier to breathe.  Drink enough fluid to keep your pee (urine) clear or pale yellow.  Eat soups and other clear broths.  Have a healthy diet.  Rest as needed.  Go back to work when your fever is gone or your doctor says it is okay. ? You may need to stay home longer to avoid giving your URI to others. ? You can also wear a face mask and wash your hands often to prevent spread of the virus.  Use your inhaler more if you have asthma.  Do not use any tobacco products, including cigarettes, chewing tobacco, or electronic cigarettes. If you need help quitting, ask your doctor. Contact a doctor if:  You are getting worse, not better.  Your symptoms are not helped by medicine.  You have chills.  You are getting more short of breath.  You have brown or red mucus.  You have yellow or brown discharge from your nose.  You have pain in your face, especially when you bend forward.  You have a fever.  You have puffy (swollen) neck glands.  You have pain while swallowing.  You have white areas in the back of your throat. Get help right away if:  You have very bad or constant: ? Headache. ? Ear pain. ? Pain in your forehead, behind your eyes, and over your cheekbones (sinus pain). ? Chest pain.  You have long-lasting (chronic) lung disease and any of the following: ? Wheezing. ? Long-lasting cough. ? Coughing up blood. ? A change in your usual mucus.  You have a stiff neck.  You have changes in your: ? Vision. ? Hearing. ? Thinking. ? Mood. This information is  not intended to replace advice given to you by your health care provider. Make sure you discuss any questions you have with your health care provider. Document Released: 05/06/2008 Document Revised: 07/21/2016 Document Reviewed: 02/23/2014 Elsevier Interactive Patient Education  2018 [employer].  Lisinopril increased from 10mg  to 20 mg once daily Continue all other regular medications as directed. Increase water, strive for at least >100 oz /day Increase vit c, up to 2000mg  daily Please take Azithromycin and Tessalon as directed. If cough persists after Azithromycin completed, please call clinic. Once over illness please increase regular movement Follow-up in 2 months to address chronic medical conditions.  FEEL BETTER!

## 2017-10-16 NOTE — Assessment & Plan Note (Signed)
Increase water, strive for at least >100 oz /day Increase vit c, up to 2000mg  daily Please take Azithromycin and Tessalon as directed. If cough persists after Azithromycin completed, please call clinic. Once over illness please increase regular movement

## 2017-10-16 NOTE — Assessment & Plan Note (Signed)
Follow-up in 2 months to address chronic medical conditions.

## 2017-10-16 NOTE — Progress Notes (Signed)
Subjective:    Patient ID: Ryan Burgess, male    DOB: August 07, 1971, 46 y.o.   MRN: [id number]  04/22/2017 OV Notes: Hypertension  Pertinent negatives include no chest pain, headaches, palpitations or shortness of breath.  URI   Associated symptoms include coughing and wheezing. Pertinent negatives include no abdominal pain, chest pain, congestion, diarrhea, headaches, nausea, rash or vomiting.  :  Ryan Burgess is here for CPE.  He is compliant on all medications and denies SE.  He is followed by GI (Encephalopathy, hepatic cirrhosis), and ID (bil lower ext cellulitis).  He has lost 20 lbs since last OV, mostly r/t fluid reduction of lower extremities (he estimates edema has reduced > 30% since onset of acute exacerbation).  He reports anxiety/depression has improved on Sertraline '50mg'$  daily.  He denies thoughts of harming himself/others.  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  He is very vague about his diet and feels that his goal wt is around 300 lbs.  He continues to struggle walking even short distances.   Today's OV Notes 09/22/2017: Ryan Burgess is here for regular f/u: HTN, HL, morbid obesity, and depression.  He is compliant on all medications and denies SE.   He denies acute cardiac/respiratory sx's.  He has gained 33 lbs since last OV in July 2018- due to lack of regular movement and diet high in fat/CHO.  He has been released from wound care and denies any active infection- he reports taking PCN prophylactically- '500mg'$  BID (was previously taking Q6H during tx for active infection).  He needs to schedule f/u with ID. He reports that a stationary bike is being delivered today and he plans on riding several days a week-GREAT! He continues to avoid tobacco/ETOH use. He is still interested in referral to Bariatric surgery.  10/16/17 OV: Ryan Burgess is here for HTN, he was started on Lisinopril '10mg'$  4 weeks ago and he reports home readings: SBP 140, DBP  70-80s He denies CP/dyspnea at rest/palpitations/dizziness He has one acute complaint today- productive cough for >1.5 weeks that has been steadily worsening. He denies fever/night sweats/N/V/D Patient Care Team    Relationship Specialty Notifications Start End  Esaw Grandchild, NP PCP - General Family Medicine  01/16/17   Christin Fudge, MD Consulting Physician Surgery  01/16/17   Arta Silence, MD Consulting Physician Gastroenterology  01/16/17     Patient Active Problem List   Diagnosis Date Noted  . Upper respiratory tract infection 10/16/2017  . Healthcare maintenance 10/16/2017  . Cellulitis of leg, left 01/16/2017  . Cellulitis of leg, right 12/23/2016  . AKI (acute kidney injury) ([place]) 12/19/2016  . Hyponatremia 12/19/2016  . Pressure injury of skin 12/19/2016  . Encephalopathy, hepatic ([place]) 11/20/2015  . Hepatic cirrhosis ([place]) 11/20/2015  . Thrombocytopenia ([place])   . Disorientation 11/19/2015  . Stroke or transient ischemic attack (TIA) diagnosed during current admission 11/19/2015  . OSA on CPAP 05/02/2015  . Hemorrhagic cystitis 01/29/2015  . Sepsis ([place]) 01/29/2015  . Flank pain 01/29/2015  . Sinus tachycardia 01/29/2015  . Lymphedema 01/29/2015  . Morbid obesity with body mass index of 50.0-59.9 in adult [facility]) 01/25/2015  . Obesity hypoventilation syndrome ([place]) 01/25/2015  . Snoring   . High cholesterol   . TIA (transient ischemic attack) 01/10/2015  . Morbid obesity ([place]) 01/10/2015  . Encephalopathy acute 01/09/2015  . HTN (hypertension) 01/09/2015  . Borderline diabetic 01/09/2015  . GERD (gastroesophageal reflux disease) 01/09/2015  . Acute  encephalopathy      Past Medical History:  Diagnosis Date  . Cellulitis   . Cirrhosis, nonalcoholic ([place])   . GERD (gastroesophageal reflux disease)   . Headache    "maybe monthly" (12/19/2016)  . Hepatic encephalopathy ([place]) ~ 2016 X 2   "first time they thought it was a TIA; dx'd hepatic encephalopathy the  2nd time it happened"  . High cholesterol   . Hypertension   . Kidney stones 01/2015   UTI  . Migraine    "none in years; used to have them frequently" (12/19/2016)  . Obesity   . OSA on CPAP   . Pre-diabetes   . Snoring   . Venous stasis      Past Surgical History:  Procedure Laterality Date  . ESOPHAGOGASTRODUODENOSCOPY (EGD) WITH PROPOFOL N/A 07/26/2015   Procedure: ESOPHAGOGASTRODUODENOSCOPY (EGD) WITH PROPOFOL;  Surgeon: Arta Silence, MD;  Location: [facility] ENDOSCOPY;  Service: Endoscopy;  Laterality: N/A;  . TEE WITHOUT CARDIOVERSION N/A 04/03/2015   Procedure: TRANSESOPHAGEAL ECHOCARDIOGRAM (TEE);  Surgeon: Adrian Prows, MD;  Location: [facility] ENDOSCOPY;  Service: Cardiovascular;  Laterality: N/A;  . WISDOM TOOTH EXTRACTION  1990     Family History  Problem Relation Age of Onset  . Leukemia Mother   . Diabetes Father   . Heart disease Maternal Grandmother   . Heart disease Paternal Grandmother   . Dementia Paternal Grandmother   . Diabetes Paternal Grandmother   . Frontotemporal dementia Paternal Grandmother   . COPD Maternal Grandfather   . Heart disease Paternal Grandfather   . Diabetes Paternal Grandfather      Social History   Substance and Sexual Activity  Drug Use No     Social History   Substance and Sexual Activity  Alcohol Use Yes  . Alcohol/week: 0.6 - 1.2 oz  . Types: 1 - 2 Standard drinks or equivalent per week   Comment: once a year     Social History   Tobacco Use  Smoking Status Never Smoker  Smokeless Tobacco Never Used     Outpatient Encounter Medications as of 10/16/2017  Medication Sig  . acetaminophen (TYLENOL) 500 MG tablet Take 1,000 mg by mouth every 6 (six) hours as needed for headache.  Marland Kitchen aspirin EC 81 MG EC tablet Take 1 tablet (81 mg total) by mouth daily.  Marland Kitchen atorvastatin (LIPITOR) 20 MG tablet Take 1 tablet (20 mg total) by mouth at bedtime.  . blood glucose meter kit and supplies KIT Dispense based on patient and insurance  preference. Use up to four times daily as directed. (FOR ICD-9 250.00, 250.01).  Marland Kitchen dexlansoprazole (DEXILANT) 60 MG capsule Take 60 mg by mouth daily.  . furosemide (LASIX) 40 MG tablet TAKE 1 TABLET BY MOUTH DAILY.  Marland Kitchen lactulose, encephalopathy, (CHRONULAC) 10 GM/15ML SOLN Take 45 mls by mouth 3 times a day  . lisinopril (PRINIVIL,ZESTRIL) 20 MG tablet Take 0.5 tablets (10 mg total) daily by mouth.  . loratadine (CLARITIN) 10 MG tablet Take 10 mg by mouth daily as needed for allergies.  . Multiple Vitamin (MULTIVITAMIN WITH MINERALS) TABS tablet Take 1 tablet by mouth daily.  . penicillin v potassium (VEETID) 500 MG tablet Take 500 mg by mouth 2 (two) times daily.  . rifaximin (XIFAXAN) 550 MG TABS tablet Take 1 tablet (550 mg total) by mouth 2 (two) times daily.  Marland Kitchen saccharomyces boulardii (FLORASTOR) 250 MG capsule Take 1 capsule (250 mg total) by mouth 2 (two) times daily.  . sertraline (ZOLOFT) 50 MG tablet  Take 1 tablet by mouth daily.  . Vitamin D, Ergocalciferol, (DRISDOL) 50000 units CAPS capsule Take 1 capsule (50,000 Units total) by mouth every 7 (seven) days.  . [DISCONTINUED] lisinopril (PRINIVIL,ZESTRIL) 10 MG tablet Take 1 tablet (10 mg total) by mouth daily.  Marland Kitchen azithromycin (ZITHROMAX) 250 MG tablet 2 tabs day one, 1 tab days 2-5  . benzonatate (TESSALON) 200 MG capsule Take 1 capsule (200 mg total) 2 (two) times daily as needed by mouth for cough.   No facility-administered encounter medications on file as of 10/16/2017.     Allergies: Patient has no known allergies.  Body mass index is 63.25 kg/m.  Blood pressure (!) 148/72, pulse 80, temperature 97.9 F (36.6 C), temperature source Oral, height '6\' 4"'$  (1.93 m), weight (!) 519 lb 9.6 oz (235.7 kg).     Review of Systems  Constitutional: Positive for activity change and fatigue. Negative for appetite change, chills and diaphoresis.  HENT: Negative for congestion.   Respiratory: Positive for cough and wheezing. Negative  for chest tightness, shortness of breath and stridor.   Cardiovascular: Positive for leg swelling. Negative for chest pain and palpitations.  Gastrointestinal: Negative for abdominal distention, abdominal pain, blood in stool, constipation, diarrhea, nausea and vomiting.  Musculoskeletal: Positive for arthralgias, back pain, gait problem, joint swelling and myalgias.  Skin: Negative for color change, pallor, rash and wound.  Neurological: Negative for dizziness and headaches.  Psychiatric/Behavioral: Positive for sleep disturbance.       Objective:   Physical Exam  Constitutional: He is oriented to person, place, and time. He appears well-developed and well-nourished. No distress.  HENT:  Head: Normocephalic and atraumatic.  Right Ear: Hearing, tympanic membrane, external ear and ear canal normal. Tympanic membrane is not perforated and not bulging. No decreased hearing is noted.  Left Ear: Hearing, tympanic membrane, external ear and ear canal normal. Tympanic membrane is not perforated and not bulging. No decreased hearing is noted.  Nose: Nose normal. Right sinus exhibits no maxillary sinus tenderness and no frontal sinus tenderness. Left sinus exhibits no maxillary sinus tenderness and no frontal sinus tenderness.  Mouth/Throat: Uvula is midline, oropharynx is clear and moist and mucous membranes are normal.  Eyes: Conjunctivae are normal. Pupils are equal, round, and reactive to light.  Neck: Normal range of motion.  Cardiovascular: Normal rate, normal heart sounds and intact distal pulses.  Pulmonary/Chest: Effort normal and breath sounds normal. No respiratory distress. He has no decreased breath sounds. He has no wheezes. He has no rhonchi. He has no rales. He exhibits no tenderness.  Constant cough throughout encounter  Abdominal: Soft. Bowel sounds are normal. He exhibits no distension and no mass. There is no tenderness. There is no rebound, no guarding and no CVA tenderness.  Hernia confirmed negative in the right inguinal area and confirmed negative in the left inguinal area.  Genitourinary: Testes normal.  Musculoskeletal: He exhibits edema and tenderness.       Right knee: He exhibits decreased range of motion and swelling.       Left knee: He exhibits decreased range of motion and swelling.       Right ankle: He exhibits decreased range of motion and swelling. He exhibits normal pulse.       Left ankle: He exhibits decreased range of motion. He exhibits normal pulse.       Right lower leg: He exhibits tenderness, swelling and edema.       Left lower leg: He exhibits tenderness, swelling and  edema.       Right foot: There is decreased range of motion and swelling.       Left foot: There is decreased range of motion and swelling.  Substantial edema of lower extremity from knees to toes. From mid shin to ankle-very reddened, warm, thickened tissue. No open tissue or active drainage. Pedal pulses palpable but VERY difficult to feel due to edema. Pitting edema on tops of feet.  Lymphadenopathy:    He has no cervical adenopathy.       Right: No inguinal adenopathy present.       Left: No inguinal adenopathy present.  Neurological: He is alert and oriented to person, place, and time. Coordination normal.  Skin: Skin is warm and dry. He is not diaphoretic.  Psychiatric: He has a normal mood and affect. His behavior is normal. Judgment and thought content normal.  Nursing note and vitals reviewed.         Assessment & Plan:   1. Upper respiratory tract infection, unspecified type   2. Hypertension, unspecified type   3. Morbid obesity ([place])   4. Healthcare maintenance     HTN (hypertension) Above goal 148/72, HR 80 Home readings SBP 140s, DBP 70-70s He denies CP/palpitations/dyspnea at rest/dizziness Increased Lisinopril from '10mg'$  to 20 mg daily Continue furosemide '40mg'$  daily Unable to combine meds with current dosage Continue to check BP at home  and bring readings to next OV  Morbid obesity ([place]) 1 lb wt gain since 09/2017 OV Discussed at length the IMPORTANCE of heart healthy diet and regular movement Referral to bariatric clinic placed in 09/2017, pt been contacted   Upper respiratory tract infection Increase water, strive for at least >100 oz /day Increase vit c, up to '2000mg'$  daily Please take Azithromycin and Tessalon as directed. If cough persists after Azithromycin completed, please call clinic. Once over illness please increase regular movement   Healthcare maintenance Follow-up in 2 months to address chronic medical conditions.    FOLLOW-UP:  Return in about 2 months (around 12/16/2017) for Regular Follow Up, HTN, Anxiety, Obesity, Depression, Diabetes.

## 2017-10-16 NOTE — Assessment & Plan Note (Signed)
1 lb wt gain since 09/2017 OV Discussed at length the IMPORTANCE of heart healthy diet and regular movement Referral to bariatric clinic placed in 09/2017, pt been contacted

## 2017-10-20 ENCOUNTER — Ambulatory Visit (HOSPITAL_BASED_OUTPATIENT_CLINIC_OR_DEPARTMENT_OTHER): Payer: BLUE CROSS/BLUE SHIELD | Admitting: Hematology and Oncology

## 2017-10-20 ENCOUNTER — Telehealth: Payer: Self-pay | Admitting: Hematology and Oncology

## 2017-10-20 ENCOUNTER — Encounter: Payer: Self-pay | Admitting: Hematology and Oncology

## 2017-10-20 ENCOUNTER — Ambulatory Visit (HOSPITAL_BASED_OUTPATIENT_CLINIC_OR_DEPARTMENT_OTHER): Payer: BLUE CROSS/BLUE SHIELD

## 2017-10-20 VITALS — BP 151/67 | HR 84 | Temp 98.7°F | Resp 19 | Ht 76.0 in | Wt >= 6400 oz

## 2017-10-20 DIAGNOSIS — K766 Portal hypertension: Secondary | ICD-10-CM

## 2017-10-20 DIAGNOSIS — K7581 Nonalcoholic steatohepatitis (NASH): Secondary | ICD-10-CM

## 2017-10-20 DIAGNOSIS — D696 Thrombocytopenia, unspecified: Secondary | ICD-10-CM

## 2017-10-20 DIAGNOSIS — D61818 Other pancytopenia: Secondary | ICD-10-CM

## 2017-10-20 DIAGNOSIS — R161 Splenomegaly, not elsewhere classified: Secondary | ICD-10-CM

## 2017-10-20 LAB — COMPREHENSIVE METABOLIC PANEL
ALT: 29 U/L (ref 0–55)
ANION GAP: 4 meq/L (ref 3–11)
AST: 60 U/L — AB (ref 5–34)
Albumin: 2.3 g/dL — ABNORMAL LOW (ref 3.5–5.0)
Alkaline Phosphatase: 126 U/L (ref 40–150)
BUN: 9.3 mg/dL (ref 7.0–26.0)
CHLORIDE: 109 meq/L (ref 98–109)
CO2: 20 meq/L — AB (ref 22–29)
CREATININE: 0.7 mg/dL (ref 0.7–1.3)
Calcium: 8.1 mg/dL — ABNORMAL LOW (ref 8.4–10.4)
EGFR: >60 mL/min/{1.73_m2} (ref >60)
GLUCOSE: 100 mg/dL (ref 70–140)
Potassium: 4.1 mEq/L (ref 3.5–5.1)
Sodium: 134 mEq/L — ABNORMAL LOW (ref 136–145)
Total Bilirubin: 2.36 mg/dL — ABNORMAL HIGH (ref 0.20–1.20)
Total Protein: 6.6 g/dL (ref 6.4–8.3)

## 2017-10-20 LAB — CBC & DIFF AND RETIC
HCT: 37.6 % — ABNORMAL LOW (ref 38.4–49.9)
HEMOGLOBIN: 13.3 g/dL (ref 13.0–17.1)
IMMATURE RETIC FRACT: 8.4 % (ref 3.00–10.60)
MCH: 33.7 pg — ABNORMAL HIGH (ref 27.2–33.4)
MCHC: 35.4 g/dL (ref 32.0–36.0)
MCV: 95.2 fL (ref 79.3–98.0)
Platelets: UNDETERMINED 10*3/uL (ref 140–400)
RBC: 3.95 10*6/uL — ABNORMAL LOW (ref 4.20–5.82)
RDW: 17 % — AB (ref 11.0–14.6)
Retic %: 1.66 % (ref 0.80–1.80)
Retic Ct Abs: 65.57 10*3/uL (ref 34.80–93.90)
WBC: 4.3 10*3/uL (ref 4.0–10.3)

## 2017-10-20 LAB — MANUAL DIFFERENTIAL
ALC: 1 10*3/uL (ref 0.9–3.3)
ANC (CHCC manual diff): 1.9 10*3/uL (ref 1.5–6.5)
BAND NEUTROPHILS: 3 % (ref 0–10)
BASOPHIL: 2 % (ref 0–2)
EOS%: 10 % — AB (ref 0–7)
LYMPH: 23 % (ref 14–49)
MONO: 19 % — ABNORMAL HIGH (ref 0–14)
PLT EST: UNDETERMINED
RBC Comments: NORMAL
SEG: 41 % (ref 38–77)

## 2017-10-20 LAB — LACTATE DEHYDROGENASE: LDH: 282 U/L — ABNORMAL HIGH (ref 125–245)

## 2017-10-20 NOTE — Telephone Encounter (Signed)
Gave avs and calendar for November  °

## 2017-10-21 LAB — FERRITIN: Ferritin: 131 ng/ml (ref 22–316)

## 2017-10-21 LAB — FOLATE

## 2017-10-21 LAB — VITAMIN B12

## 2017-10-21 LAB — HEPATITIS C ANTIBODY (REFLEX): HCV Ab: <0.1 s/co ratio (ref 0.0–0.9)

## 2017-10-21 LAB — IRON AND TIBC
%SAT: 91 % — ABNORMAL HIGH (ref 20–55)
Iron: 166 ug/dL — ABNORMAL HIGH (ref 42–163)
TIBC: 181 ug/dL — ABNORMAL LOW (ref 202–409)
UIBC: 16 ug/dL — AB (ref 117–376)

## 2017-10-21 LAB — HEPATITIS B CORE ANTIBODY, TOTAL: Hep B Core Ab, Tot: NEGATIVE

## 2017-10-30 ENCOUNTER — Ambulatory Visit (HOSPITAL_BASED_OUTPATIENT_CLINIC_OR_DEPARTMENT_OTHER): Payer: BLUE CROSS/BLUE SHIELD | Admitting: Hematology and Oncology

## 2017-10-30 ENCOUNTER — Encounter: Payer: Self-pay | Admitting: Hematology and Oncology

## 2017-10-30 ENCOUNTER — Other Ambulatory Visit: Payer: BLUE CROSS/BLUE SHIELD

## 2017-10-30 ENCOUNTER — Telehealth: Payer: Self-pay | Admitting: Hematology and Oncology

## 2017-10-30 VITALS — BP 148/52 | HR 69 | Temp 98.2°F | Resp 20 | Ht 76.0 in | Wt >= 6400 oz

## 2017-10-30 DIAGNOSIS — D61818 Other pancytopenia: Secondary | ICD-10-CM

## 2017-10-30 DIAGNOSIS — K7460 Unspecified cirrhosis of liver: Secondary | ICD-10-CM

## 2017-10-30 DIAGNOSIS — K766 Portal hypertension: Secondary | ICD-10-CM

## 2017-10-30 LAB — CBC & DIFF AND RETIC
BASO%: 1.2 % (ref 0.0–2.0)
Basophils Absolute: 0 10*3/uL (ref 0.0–0.1)
EOS%: 7.7 % — ABNORMAL HIGH (ref 0.0–7.0)
Eosinophils Absolute: 0.3 10*3/uL (ref 0.0–0.5)
HCT: 39.4 % (ref 38.4–49.9)
HGB: 13.4 g/dL (ref 13.0–17.1)
IMMATURE RETIC FRACT: 2.3 % — AB (ref 3.00–10.60)
LYMPH#: 1.3 10*3/uL (ref 0.9–3.3)
LYMPH%: 34.3 % (ref 14.0–49.0)
MCH: 33.5 pg — ABNORMAL HIGH (ref 27.2–33.4)
MCHC: 34 g/dL (ref 32.0–36.0)
MCV: 98.5 fL — ABNORMAL HIGH (ref 79.3–98.0)
MONO#: 0.6 10*3/uL (ref 0.1–0.9)
MONO%: 15.2 % — ABNORMAL HIGH (ref 0.0–14.0)
NEUT%: 41.6 % (ref 39.0–75.0)
NEUTROS ABS: 1.6 10*3/uL (ref 1.5–6.5)
Platelets: 80 10*3/uL — ABNORMAL LOW (ref 140–400)
RBC: 4 10*6/uL — AB (ref 4.20–5.82)
RDW: 17.4 % — ABNORMAL HIGH (ref 11.0–14.6)
RETIC %: 1.33 % (ref 0.80–1.80)
RETIC CT ABS: 53.2 10*3/uL (ref 34.80–93.90)
WBC: 3.8 10*3/uL — AB (ref 4.0–10.3)

## 2017-10-30 NOTE — Telephone Encounter (Signed)
Scheduled appt per 11/29 los - Gave patient AVS and calender per los.  

## 2017-11-06 ENCOUNTER — Other Ambulatory Visit (HOSPITAL_BASED_OUTPATIENT_CLINIC_OR_DEPARTMENT_OTHER): Payer: BLUE CROSS/BLUE SHIELD

## 2017-11-06 DIAGNOSIS — D61818 Other pancytopenia: Secondary | ICD-10-CM | POA: Diagnosis not present

## 2017-11-07 LAB — AFP TUMOR MARKER: AFP, SERUM, TUMOR MARKER: 2.1 ng/mL (ref 0.0–8.3)

## 2017-11-08 ENCOUNTER — Encounter: Payer: Self-pay | Admitting: Hematology and Oncology

## 2017-11-08 DIAGNOSIS — D61818 Other pancytopenia: Secondary | ICD-10-CM | POA: Insufficient documentation

## 2017-11-08 NOTE — Assessment & Plan Note (Signed)
46 y.o. male with nonalcoholic steatohepatitis leading to cirrhosis of the liver with portal hypertension and known splenomegaly referred to our clinic for evaluation of progressive thrombocytopenia.  Thrombocytopenia appears to be chronic dating back at least 2 years with baseline platelet count of around 80 with a recent drop of unknown duration down into the 60s.  No pathological bleeding at this time.  Concurrently, patient has had a decrease in his hemoglobin by about 2 g and significant drop in the white blood cell counts, but stable since February 2018.  Regarding to his thrombocytopenia, his splenomegaly is the leading cause of the decreased platelet counts, but patient was on several medications that may be contributing to the low platelet counts including his penicillin, and furosemide.  Concurrently, patient appears to have trend for macrocytosis and his red blood cells and underlying myelodysplastic changes in the bone marrow cannot be excluded, especially in conjunction with the hepatic dysfunction.  Plan: --Labs as outlined below --Recent one clinic to review the findings

## 2017-11-08 NOTE — Progress Notes (Signed)
[facility] New Visit:  Assessment: Other pancytopenia [facility]) 46 y.o. male with nonalcoholic steatohepatitis leading to cirrhosis of the liver with portal hypertension and known splenomegaly referred to our clinic for evaluation of progressive thrombocytopenia.  Thrombocytopenia appears to be chronic dating back at least 2 years with baseline platelet count of around 80 with a recent drop of unknown duration down into the 60s.  No pathological bleeding at this time.  Concurrently, patient has had a decrease in his hemoglobin by about 2 g and significant drop in the white blood cell counts, but stable since February 2018.  Regarding to his thrombocytopenia, his splenomegaly is the leading cause of the decreased platelet counts, but patient was on several medications that may be contributing to the low platelet counts including his penicillin, and furosemide.  Concurrently, patient appears to have trend for macrocytosis and his red blood cells and underlying myelodysplastic changes in the bone marrow cannot be excluded, especially in conjunction with the hepatic dysfunction.  Plan: --Labs as outlined below --Recent one clinic to review the findings Voice recognition software was used and creation of this note. Despite my best effort at editing the text, some misspelling/errors may have occurred.  Orders Placed This Encounter  Procedures  . CBC & Diff and Retic    Standing Status:   Future    Number of Occurrences:   1    Standing Expiration Date:   10/20/2018  . Morphology    Standing Status:   Future    Number of Occurrences:   1    Standing Expiration Date:   10/20/2018  . Lactate dehydrogenase (LDH)    Standing Status:   Future    Number of Occurrences:   1    Standing Expiration Date:   10/20/2018  . Comprehensive metabolic panel    Standing Status:   Future    Number of Occurrences:   1    Standing Expiration Date:   10/20/2018  . Ferritin    Standing Status:    Future    Number of Occurrences:   1    Standing Expiration Date:   10/20/2018  . Iron and TIBC    Standing Status:   Future    Number of Occurrences:   1    Standing Expiration Date:   10/20/2018  . Folate, Serum    Standing Status:   Future    Number of Occurrences:   1    Standing Expiration Date:   10/20/2018  . Vitamin B12    Standing Status:   Future    Number of Occurrences:   1    Standing Expiration Date:   10/20/2018  . Fecal, Occult Blood    Standing Status:   Future    Standing Expiration Date:   10/20/2018  . Hepatitis C antibody (reflex if positive)    Standing Status:   Future    Number of Occurrences:   1    Standing Expiration Date:   10/20/2018  . Hepatitis B core antibody, total    Standing Status:   Future    Number of Occurrences:   1    Standing Expiration Date:   10/20/2018  . Hepatitis B surface antigen    Standing Status:   Future    Number of Occurrences:   1    Standing Expiration Date:   10/20/2018  . Hepatitis B surface antibody    Standing Status:   Future    Number of  Occurrences:   1    Standing Expiration Date:   10/20/2018  . Hepatitis B E antigen    Standing Status:   Future    Number of Occurrences:   1    Standing Expiration Date:   10/20/2018    All questions were answered.  . The patient knows to call the clinic with any problems, questions or concerns.  This note was electronically signed.    History of Presenting Illness Ryan Burgess 46 y.o. presenting to the [facility] for thrombocytopenia, referred by Esaw Grandchild, NP.  Patient's past medical history is significant for nonalcoholic steatohepatitis is with cirrhosis and portal hypertension with splenomegaly.  Previous history of encephalopathy, history of bilateral lower extremity cellulitis with sepsis in January and February 2018.  Hypertension, hyperlipidemia, morbid obesity, and depression.  Patient's medication list is significant for aspirin, atorvastatin,  furosemide, prophylactic penicillin V, and chronic rifaximin for the past 2 years.  Patient is also recently receiving azithromycin since last Friday and sertraline that has been started in February 2018.  Patient denies any active new complaints.  Denies any new epistaxis, gum bleeding, easy bruisability, hemoptysis, hematemesis, melena nycturia, or hematochezia.  No recurrence of encephalopathy in the past several months.  No recent severe infections.  Denies fevers, chills, night sweats.  No swelling of lymph nodes in the neck, armpits, or groin that the patient knows about.  Oncological/hematological History: --Labs, 12/19/16: WBC 15.6, Hgb 13.5, MCV 91.3, MCH 32.5, RDW 16.0, Plt 80; --Labs, 01/16/17: WBC   3.7, Hgb 11.9, MCV 95.4, MCH 32.5, RDW 16.8, Plt 83; --Labs, 09/17/17: WBC   3.1, Hgb 11.8, MCV 98.0, MCH 32.2, RDW 16.6, Plt 65  Medical History: Past Medical History:  Diagnosis Date  . Cellulitis   . Cirrhosis, nonalcoholic ([place])   . GERD (gastroesophageal reflux disease)   . Headache    "maybe monthly" (12/19/2016)  . Hepatic encephalopathy ([place]) ~ 2016 X 2   "first time they thought it was a TIA; dx'd hepatic encephalopathy the 2nd time it happened"  . High cholesterol   . Hypertension   . Kidney stones 01/2015   UTI  . Migraine    "none in years; used to have them frequently" (12/19/2016)  . Obesity   . OSA on CPAP   . Pre-diabetes   . Snoring   . Venous stasis     Surgical History: Past Surgical History:  Procedure Laterality Date  . ESOPHAGOGASTRODUODENOSCOPY (EGD) WITH PROPOFOL N/A 07/26/2015   Procedure: ESOPHAGOGASTRODUODENOSCOPY (EGD) WITH PROPOFOL;  Surgeon: Arta Silence, MD;  Location: [facility] ENDOSCOPY;  Service: Endoscopy;  Laterality: N/A;  . TEE WITHOUT CARDIOVERSION N/A 04/03/2015   Procedure: TRANSESOPHAGEAL ECHOCARDIOGRAM (TEE);  Surgeon: Adrian Prows, MD;  Location: [facility] ENDOSCOPY;  Service: Cardiovascular;  Laterality: N/A;  . WISDOM TOOTH EXTRACTION  1990     Family History: Family History  Problem Relation Age of Onset  . Leukemia Mother   . Diabetes Father   . Heart disease Maternal Grandmother   . Heart disease Paternal Grandmother   . Dementia Paternal Grandmother   . Diabetes Paternal Grandmother   . Frontotemporal dementia Paternal Grandmother   . COPD Maternal Grandfather   . Heart disease Paternal Grandfather   . Diabetes Paternal Grandfather     Social History: Social History   Socioeconomic History  . Marital status: Married    Spouse name: Hoyle Sauer  . Number of children: 2  . Years of education: masters  . Highest education level:  Not on file  Social Needs  . Financial resource strain: Not on file  . Food insecurity - worry: Not on file  . Food insecurity - inability: Not on file  . Transportation needs - medical: Not on file  . Transportation needs - non-medical: Not on file  Occupational History  . Not on file  Tobacco Use  . Smoking status: Never Smoker  . Smokeless tobacco: Never Used  Substance and Sexual Activity  . Alcohol use: Yes    Alcohol/week: 0.6 - 1.2 oz    Types: 1 - 2 Standard drinks or equivalent per week    Comment: once a year  . Drug use: No  . Sexual activity: Yes    Birth control/protection: None  Other Topics Concern  . Not on file  Social History Narrative   Patient lives at home with his wife Hoyle Sauer) and two children.    Patient works full time at [employer] in [place].   Right handed.   Caffeine 1-2 sodas and coffee daily.    Allergies: No Known Allergies  Medications:  Current Outpatient Medications  Medication Sig Dispense Refill  . acetaminophen (TYLENOL) 500 MG tablet Take 1,000 mg by mouth every 6 (six) hours as needed for headache.    Marland Kitchen aspirin EC 81 MG EC tablet Take 1 tablet (81 mg total) by mouth daily. 30 tablet 11  . atorvastatin (LIPITOR) 20 MG tablet Take 1 tablet (20 mg total) by mouth at bedtime. 90 tablet 0  .  azithromycin (ZITHROMAX) 250 MG tablet 2 tabs day one, 1 tab days 2-5 6 tablet 0  . benzonatate (TESSALON) 200 MG capsule Take 1 capsule (200 mg total) 2 (two) times daily as needed by mouth for cough. 20 capsule 0  . blood glucose meter kit and supplies KIT Dispense based on patient and insurance preference. Use up to four times daily as directed. (FOR ICD-9 250.00, 250.01). 1 each 0  . dexlansoprazole (DEXILANT) 60 MG capsule Take 60 mg by mouth daily.    . furosemide (LASIX) 40 MG tablet TAKE 1 TABLET BY MOUTH DAILY. 90 tablet 0  . lactulose, encephalopathy, (CHRONULAC) 10 GM/15ML SOLN Take 45 mls by mouth 3 times a day  3  . lisinopril (PRINIVIL,ZESTRIL) 20 MG tablet Take 0.5 tablets (10 mg total) daily by mouth. 90 tablet 2  . loratadine (CLARITIN) 10 MG tablet Take 10 mg by mouth daily as needed for allergies.    . Multiple Vitamin (MULTIVITAMIN WITH MINERALS) TABS tablet Take 1 tablet by mouth daily.    . penicillin v potassium (VEETID) 500 MG tablet Take 500 mg by mouth 2 (two) times daily.    . rifaximin (XIFAXAN) 550 MG TABS tablet Take 1 tablet (550 mg total) by mouth 2 (two) times daily. 7 tablet 0  . saccharomyces boulardii (FLORASTOR) 250 MG capsule Take 1 capsule (250 mg total) by mouth 2 (two) times daily. 180 capsule 0  . sertraline (ZOLOFT) 50 MG tablet Take 1 tablet by mouth daily.  12  . Vitamin D, Ergocalciferol, (DRISDOL) 50000 units CAPS capsule Take 1 capsule (50,000 Units total) by mouth every 7 (seven) days. 16 capsule 0   No current facility-administered medications for this visit.     Review of Systems: Review of Systems  All other systems reviewed and are negative.    PHYSICAL EXAMINATION Blood pressure (!) 151/67, pulse 84, temperature 98.7 F (37.1 C), temperature source Oral, resp. rate  19, height 6' 4" (1.93 m), weight (!) 515 lb 11.2 oz (233.9 kg), SpO2 98 %.  ECOG PERFORMANCE STATUS: 1 - Symptomatic but completely ambulatory  Physical Exam   Constitutional: He is oriented to person, place, and time. No distress.  Morbidly obese  HENT:  Head: Normocephalic and atraumatic.  Mouth/Throat: Oropharynx is clear and moist. No oropharyngeal exudate.  Eyes: Conjunctivae and EOM are normal. Pupils are equal, round, and reactive to light. No scleral icterus.  Neck: No thyromegaly present.  Cardiovascular: Normal rate and regular rhythm.  No murmur heard. Pulmonary/Chest: Effort normal and breath sounds normal. No respiratory distress. He has no wheezes.  Abdominal: Soft. There is no tenderness. There is no guarding.  Large panus  Musculoskeletal: He exhibits edema.  Lymphadenopathy:    He has no cervical adenopathy.  Neurological: He is alert and oriented to person, place, and time. He has normal reflexes. No cranial nerve deficit.  Skin: Skin is warm and dry. He is not diaphoretic. No erythema.     LABORATORY DATA: I have personally reviewed the data as listed: Appointment on 10/20/2017  Component Date Value Ref Range Status  . Vitamin B12 10/20/2017 >2000* 232 - 1245 pg/mL Final  . Folate 10/20/2017 >20.0  >3.0 ng/mL Final   Comment: A serum folate concentration of less than 3.1 ng/mL is considered to represent clinical deficiency.   . Iron 10/20/2017 166* 42 - 163 ug/dL Final  . TIBC 10/20/2017 181* 202 - 409 ug/dL Final  . UIBC 10/20/2017 16* 117 - 376 ug/dL Final  . %SAT 10/20/2017 91* 20 - 55 % Final  . Ferritin 10/20/2017 131  22 - 316 ng/ml Final  . Sodium 10/20/2017 134* 136 - 145 mEq/L Final  . Potassium 10/20/2017 4.1  3.5 - 5.1 mEq/L Final  . Chloride 10/20/2017 109  98 - 109 mEq/L Final  . CO2 10/20/2017 20* 22 - 29 mEq/L Final  . Glucose 10/20/2017 100  70 - 140 mg/dl Final   Glucose reference range is for nonfasting patients. Fasting glucose reference range is 70- 100.  Marland Kitchen BUN 10/20/2017 9.3  7.0 - 26.0 mg/dL Final  . Creatinine 10/20/2017 0.7  0.7 - 1.3 mg/dL Final  . Total Bilirubin 10/20/2017 2.36* 0.20 -  1.20 mg/dL Final  . Alkaline Phosphatase 10/20/2017 126  40 - 150 U/L Final  . AST 10/20/2017 60* 5 - 34 U/L Final  . ALT 10/20/2017 29  0 - 55 U/L Final  . Total Protein 10/20/2017 6.6  6.4 - 8.3 g/dL Final  . Albumin 10/20/2017 2.3* 3.5 - 5.0 g/dL Final  . Calcium 10/20/2017 8.1* 8.4 - 10.4 mg/dL Final  . Anion Gap 10/20/2017 4  3 - 11 mEq/L Final  . EGFR 10/20/2017 >60  >60 ml/min/1.73 m2 Final   eGFR is calculated using the CKD-EPI Creatinine Equation (2009)  . LDH 10/20/2017 282* 125 - 245 U/L Final  . WBC 10/20/2017 4.3  4.0 - 10.3 10e3/uL Final  . HGB 10/20/2017 13.3  13.0 - 17.1 g/dL Final  . HCT 10/20/2017 37.6* 38.4 - 49.9 % Final  . Platelets 10/20/2017 Clumped Platelets-- Unable to determine  140 - 400 10e3/uL Final  . MCV 10/20/2017 95.2  79.3 - 98.0 fL Final  . MCH 10/20/2017 33.7* 27.2 - 33.4 pg Final  . MCHC 10/20/2017 35.4  32.0 - 36.0 g/dL Final  . RBC 10/20/2017 3.95* 4.20 - 5.82 10e6/uL Final  . RDW 10/20/2017 17.0* 11.0 - 14.6 % Final  . Retic %  10/20/2017 1.66  0.80 - 1.80 % Final  . Retic Ct Abs 10/20/2017 65.57  34.80 - 93.90 10e3/uL Final  . Immature Retic Fract 10/20/2017 8.40  3.00 - 10.60 % Final  . Hep B Core Ab, Tot 10/20/2017 Negative  Negative Final  . HCV Ab 10/20/2017 <0.1  0.0 - 0.9 s/co ratio Final  . Comment: 10/20/2017 Comment   Final   Comment: Non reactive HCV antibody screen is consistent with no HCV infection, unless recent infection is suspected or other evidence exists to indicate HCV infection.   . ANC ([facility] manual diff) 10/20/2017 1.9  1.5 - 6.5 10e3/uL Final  . ALC 10/20/2017 1.0  0.9 - 3.3 10e3/uL Final  . SEG 10/20/2017 41  38 - 77 % Final  . Band Neutrophils 10/20/2017 3  0 - 10 % Final  . LYMPH 10/20/2017 23  14 - 49 % Final  . MONO 10/20/2017 19* 0 - 14 % Final  . EOS 10/20/2017 10* 0 - 7 % Final  . Basophil 10/20/2017 2  0 - 2 % Final  . RBC Comments 10/20/2017 Within Normal Limits  Within Normal Limits Final  . PLT EST  10/20/2017 Unable to determine  Adequate Final  . Platelet Morphology 10/20/2017 Clumped Platelets  Within Normal Limits Final         Ardath Sax, MD

## 2017-11-10 LAB — PROTEIN ELECTROPHORESIS, SERUM, WITH REFLEX
A/G Ratio: 0.6 — ABNORMAL LOW (ref 0.7–1.7)
ALPHA 1: 0.1 g/dL (ref 0.0–0.4)
ALPHA 2: 0.4 g/dL (ref 0.4–1.0)
Albumin: 2.5 g/dL — ABNORMAL LOW (ref 2.9–4.4)
Beta: 1 g/dL (ref 0.7–1.3)
Gamma Globulin: 2.4 g/dL — ABNORMAL HIGH (ref 0.4–1.8)
Globulin, Total: 3.9 g/dL (ref 2.2–3.9)
TOTAL PROTEIN: 6.4 g/dL (ref 6.0–8.5)

## 2017-12-05 NOTE — Progress Notes (Signed)
[facility] Follow-up Visit:  Assessment: Other pancytopenia [facility]) 47 y.o. male with nonalcoholic steatohepatitis leading to cirrhosis of the liver with portal hypertension and known splenomegaly referred to our clinic for evaluation of progressive thrombocytopenia.  Thrombocytopenia appears to be chronic dating back at least 2 years with baseline platelet count of around 80 with a recent drop of unknown duration down into the 60s.  No pathological bleeding at this time.  Concurrently, patient has had a decrease in his hemoglobin by about 2 g and significant drop in the white blood cell counts, but stable since February 2018.  Repeat lab work demonstrates platelet clumping making accurate platelet count estimate impossible.  Hemoglobin is improved or stable, white blood cell count has recovered as well.  LFTs them significant abnormalities with elevated bilirubin and transaminitis consistent with cirrhosis.  Iron level, folate, and B12 are within normal ranges even accounting for cirrhosis.  Decreased albumin likely indicative of decreased synthetic function of the liver.  Overall, I believe the hematological abnormalities are directly related to the hepatic cirrhosis.  At this time, no additional intervention is necessary based on the degree of cytopenias.  Continued hematological monitoring is advisable.  Plan: --Return to my clinic in 2 months: Labs, clinic visit.  Voice recognition software was used and creation of this note. Despite my best effort at editing the text, some misspelling/errors may have occurred.  Orders Placed This Encounter  Procedures  . CBC & Diff and Retic  . SPEP with reflex to IFE    Standing Status:   Future    Number of Occurrences:   1    Standing Expiration Date:   10/30/2018  . AFP tumor marker    Standing Status:   Future    Number of Occurrences:   1    Standing Expiration Date:   10/30/2018  . CBC & Diff and Retic    Standing Status:    Future    Standing Expiration Date:   10/30/2018  . Comprehensive metabolic panel    Standing Status:   Future    Standing Expiration Date:   10/30/2018  . Lactate dehydrogenase (LDH)    Standing Status:   Future    Standing Expiration Date:   10/30/2018  . Uric acid    Standing Status:   Future    Standing Expiration Date:   10/30/2018    All questions were answered.  . The patient knows to call the clinic with any problems, questions or concerns.  This note was electronically signed.    History of Presenting Illness Ryan Burgess is a 47 y.o. male followed in the [place] for thrombocytopenia, referred by Esaw Grandchild, NP.  Patient's past medical history is significant for nonalcoholic steatohepatitis is with cirrhosis and portal hypertension with splenomegaly.  Previous history of encephalopathy, history of bilateral lower extremity cellulitis with sepsis in January and February 2018.  Hypertension, hyperlipidemia, morbid obesity, and depression.  Patient's medication list is significant for aspirin, atorvastatin, furosemide, prophylactic penicillin V, and chronic rifaximin for the past 2 years.  Patient is also recently receiving azithromycin since last Friday and sertraline that has been started in February 2018.  Patient denies any active new complaints.  Denies any new epistaxis, gum bleeding, easy bruisability, hemoptysis, hematemesis, melena nycturia, or hematochezia.  No recurrence of encephalopathy in the past several months.  No recent severe infections.  Denies fevers, chills, night sweats.  No swelling of lymph nodes in the neck,  armpits, or groin that the patient knows about.  Oncological/hematological History: --Labs, 12/19/16: WBC 15.6, Hgb 13.5, MCV 91.3, MCH 32.5, RDW 16.0, Plt 80; --Labs, 01/16/17: WBC   3.7, Hgb 11.9, MCV 95.4, MCH 32.5, RDW 16.8, Plt 83; --Labs, 09/17/17: WBC   3.1, Hgb 11.8, MCV 98.0, MCH 32.2, RDW 16.6, Plt 65 --Labs, 10/20/17: WBC    4.3, Hgb 13.3,               Plt Clumped; Fe 166, FeSat 91%, TIBC 181, Ferritin 131, Folate >20, Vit B12 >2,000; tBili 2.4, tProt 6.6, Alb 2.3, AP 126, AST 60, ALT 29, LDH 282, AFP 2.1; SPEP -- negative for monoclonal protein   Medical History: Past Medical History:  Diagnosis Date  . Cellulitis   . Cirrhosis, nonalcoholic ([place])   . GERD (gastroesophageal reflux disease)   . Headache    "maybe monthly" (12/19/2016)  . Hepatic encephalopathy ([place]) ~ 2016 X 2   "first time they thought it was a TIA; dx'd hepatic encephalopathy the 2nd time it happened"  . High cholesterol   . Hypertension   . Kidney stones 01/2015   UTI  . Migraine    "none in years; used to have them frequently" (12/19/2016)  . Morbid obesity with body mass index of 50.0-59.9 in adult [facility]) 01/25/2015  . OSA on CPAP   . Pre-diabetes   . Snoring   . Venous stasis     Surgical History: Past Surgical History:  Procedure Laterality Date  . ESOPHAGOGASTRODUODENOSCOPY (EGD) WITH PROPOFOL N/A 07/26/2015   Procedure: ESOPHAGOGASTRODUODENOSCOPY (EGD) WITH PROPOFOL;  Surgeon: Arta Silence, MD;  Location: [facility] ENDOSCOPY;  Service: Endoscopy;  Laterality: N/A;  . TEE WITHOUT CARDIOVERSION N/A 04/03/2015   Procedure: TRANSESOPHAGEAL ECHOCARDIOGRAM (TEE);  Surgeon: Adrian Prows, MD;  Location: [facility] ENDOSCOPY;  Service: Cardiovascular;  Laterality: N/A;  . WISDOM TOOTH EXTRACTION  1990    Family History: Family History  Problem Relation Age of Onset  . Leukemia Mother   . Diabetes Father   . Heart disease Maternal Grandmother   . Heart disease Paternal Grandmother   . Dementia Paternal Grandmother   . Diabetes Paternal Grandmother   . Frontotemporal dementia Paternal Grandmother   . COPD Maternal Grandfather   . Heart disease Paternal Grandfather   . Diabetes Paternal Grandfather     Social History: Social History   Socioeconomic History  . Marital status: Married    Spouse name: Hoyle Sauer  . Number of children: 2  .  Years of education: masters  . Highest education level: Not on file  Social Needs  . Financial resource strain: Not on file  . Food insecurity - worry: Not on file  . Food insecurity - inability: Not on file  . Transportation needs - medical: Not on file  . Transportation needs - non-medical: Not on file  Occupational History  . Not on file  Tobacco Use  . Smoking status: Never Smoker  . Smokeless tobacco: Never Used  Substance and Sexual Activity  . Alcohol use: Yes    Alcohol/week: 0.6 - 1.2 oz    Types: 1 - 2 Standard drinks or equivalent per week    Comment: once a year  . Drug use: No  . Sexual activity: Yes    Birth control/protection: None  Other Topics Concern  . Not on file  Social History Narrative   Patient lives at home with his wife Hoyle Sauer) and two children.    Patient works full time at [employer] in [place].   Right handed.   Caffeine 1-2 sodas and coffee daily.    Allergies: No Known Allergies  Medications:  Current Outpatient Medications  Medication Sig Dispense Refill  . acetaminophen (TYLENOL) 500 MG tablet Take 1,000 mg by mouth every 6 (six) hours as needed for headache.    Marland Kitchen aspirin EC 81 MG EC tablet Take 1 tablet (81 mg total) by mouth daily. 30 tablet 11  . atorvastatin (LIPITOR) 20 MG tablet Take 1 tablet (20 mg total) by mouth at bedtime. 90 tablet 0  . azithromycin (ZITHROMAX) 250 MG tablet 2 tabs day one, 1 tab days 2-5 6 tablet 0  . benzonatate (TESSALON) 200 MG capsule Take 1 capsule (200 mg total) 2 (two) times daily as needed by mouth for cough. 20 capsule 0  . blood glucose meter kit and supplies KIT Dispense based on patient and insurance preference. Use up to four times daily as directed. (FOR ICD-9 250.00, 250.01). 1 each 0  . dexlansoprazole (DEXILANT) 60 MG capsule Take 60 mg by mouth daily.    . furosemide (LASIX) 40 MG tablet TAKE 1 TABLET BY MOUTH DAILY. 90 tablet 0  . lactulose,  encephalopathy, (CHRONULAC) 10 GM/15ML SOLN Take 45 mls by mouth 3 times a day  3  . lisinopril (PRINIVIL,ZESTRIL) 20 MG tablet Take 0.5 tablets (10 mg total) daily by mouth. 90 tablet 2  . loratadine (CLARITIN) 10 MG tablet Take 10 mg by mouth daily as needed for allergies.    . Multiple Vitamin (MULTIVITAMIN WITH MINERALS) TABS tablet Take 1 tablet by mouth daily.    . penicillin v potassium (VEETID) 500 MG tablet Take 500 mg by mouth 2 (two) times daily.    . rifaximin (XIFAXAN) 550 MG TABS tablet Take 1 tablet (550 mg total) by mouth 2 (two) times daily. 7 tablet 0  . saccharomyces boulardii (FLORASTOR) 250 MG capsule Take 1 capsule (250 mg total) by mouth 2 (two) times daily. 180 capsule 0  . sertraline (ZOLOFT) 50 MG tablet Take 1 tablet by mouth daily.  12  . Vitamin D, Ergocalciferol, (DRISDOL) 50000 units CAPS capsule Take 1 capsule (50,000 Units total) by mouth every 7 (seven) days. 16 capsule 0   No current facility-administered medications for this visit.     Review of Systems: Review of Systems  All other systems reviewed and are negative.    PHYSICAL EXAMINATION Blood pressure (!) 148/52, pulse 69, temperature 98.2 F (36.8 C), temperature source Oral, resp. rate 20, height '6\' 4"'$  (1.93 m), weight (!) 516 lb 4.8 oz (234.2 kg), SpO2 100 %.  ECOG PERFORMANCE STATUS: 0 - Asymptomatic  Physical Exam  Constitutional: He is oriented to person, place, and time. No distress.  Morbidly obese  HENT:  Head: Normocephalic and atraumatic.  Mouth/Throat: Oropharynx is clear and moist. No oropharyngeal exudate.  Eyes: Conjunctivae and EOM are normal. Pupils are equal, round, and reactive to light. No scleral icterus.  Neck: No thyromegaly present.  Cardiovascular: Normal rate and regular rhythm.  No murmur heard. Pulmonary/Chest: Effort normal and breath sounds normal. No respiratory distress. He has no wheezes.  Abdominal: Soft. There is no tenderness. There is no guarding.  Large  panus  Musculoskeletal: He exhibits edema.  Lymphadenopathy:    He has no cervical adenopathy.  Neurological: He is alert and oriented to person, place, and time. He has normal reflexes. No cranial nerve deficit.  Skin: Skin is warm and  dry. He is not diaphoretic. No erythema.     LABORATORY DATA: I have personally reviewed the data as listed: Office Visit on 10/30/2017  Component Date Value Ref Range Status  . WBC 10/30/2017 3.8* 4.0 - 10.3 10e3/uL Final  . NEUT# 10/30/2017 1.6  1.5 - 6.5 10e3/uL Final  . HGB 10/30/2017 13.4  13.0 - 17.1 g/dL Final  . HCT 10/30/2017 39.4  38.4 - 49.9 % Final  . Platelets 10/30/2017 80* 140 - 400 10e3/uL Final  . MCV 10/30/2017 98.5* 79.3 - 98.0 fL Final  . MCH 10/30/2017 33.5* 27.2 - 33.4 pg Final  . MCHC 10/30/2017 34.0  32.0 - 36.0 g/dL Final  . RBC 10/30/2017 4.00* 4.20 - 5.82 10e6/uL Final  . RDW 10/30/2017 17.4* 11.0 - 14.6 % Final  . lymph# 10/30/2017 1.3  0.9 - 3.3 10e3/uL Final  . MONO# 10/30/2017 0.6  0.1 - 0.9 10e3/uL Final  . Eosinophils Absolute 10/30/2017 0.3  0.0 - 0.5 10e3/uL Final  . Basophils Absolute 10/30/2017 0.0  0.0 - 0.1 10e3/uL Final  . NEUT% 10/30/2017 41.6  39.0 - 75.0 % Final  . LYMPH% 10/30/2017 34.3  14.0 - 49.0 % Final  . MONO% 10/30/2017 15.2* 0.0 - 14.0 % Final  . EOS% 10/30/2017 7.7* 0.0 - 7.0 % Final  . BASO% 10/30/2017 1.2  0.0 - 2.0 % Final  . Retic % 10/30/2017 1.33  0.80 - 1.80 % Final  . Retic Ct Abs 10/30/2017 53.20  34.80 - 93.90 10e3/uL Final  . Immature Retic Fract 10/30/2017 2.30* 3.00 - 10.60 % Final       Ardath Sax, MD

## 2017-12-05 NOTE — Assessment & Plan Note (Signed)
47 y.o. male with nonalcoholic steatohepatitis leading to cirrhosis of the liver with portal hypertension and known splenomegaly referred to our clinic for evaluation of progressive thrombocytopenia.  Thrombocytopenia appears to be chronic dating back at least 2 years with baseline platelet count of around 80 with a recent drop of unknown duration down into the 60s.  No pathological bleeding at this time.  Concurrently, patient has had a decrease in his hemoglobin by about 2 g and significant drop in the white blood cell counts, but stable since February 2018.  Repeat lab work demonstrates platelet clumping making accurate platelet count estimate impossible.  Hemoglobin is improved or stable, white blood cell count has recovered as well.  LFTs them significant abnormalities with elevated bilirubin and transaminitis consistent with cirrhosis.  Iron level, folate, and B12 are within normal ranges even accounting for cirrhosis.  Decreased albumin likely indicative of decreased synthetic function of the liver.  Overall, I believe the hematological abnormalities are directly related to the hepatic cirrhosis.  At this time, no additional intervention is necessary based on the degree of cytopenias.  Continued hematological monitoring is advisable.  Plan: --Return to my clinic in 2 months: Labs, clinic visit.

## 2017-12-08 ENCOUNTER — Other Ambulatory Visit: Payer: Self-pay

## 2017-12-08 MED ORDER — DEXLANSOPRAZOLE 60 MG PO CPDR: 60.0000 mg | DELAYED_RELEASE_CAPSULE | Freq: Every day | ORAL | 0 refills | Status: DC

## 2017-12-08 NOTE — Telephone Encounter (Signed)
We have not prescribed these medications for the patient previously.  Please review and refill if appropriate.  T. Charli Halle, CMA  

## 2017-12-15 NOTE — Progress Notes (Signed)
Subjective:    Patient ID: Ryan Burgess, male    DOB: [birth year], 47 y.o.   MRN: [id number]  04/22/2017 OV:  [name] is here for CPE.  He is compliant on all medications and denies SE.  He is followed by GI (Encephalopathy, hepatic cirrhosis), and ID (bil lower ext cellulitis).  He has lost 20 lbs since last OV, mostly r/t fluid reduction of lower extremities (he estimates edema has reduced > 30% since onset of acute exacerbation).  He reports anxiety/depression has improved on Sertraline '50mg'$  daily.  He denies thoughts of harming himself/others.  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  He is very vague about his diet and feels that his goal wt is around 300 lbs.  He continues to struggle walking even short distances.    09/22/2017 OV: [name] is here for regular f/u: HTN, HL, morbid obesity, and depression.  He is compliant on all medications and denies SE.   He denies acute cardiac/respiratory sx's.  He has gained 33 lbs since last OV in July 2018- due to lack of regular movement and diet high in fat/CHO.  He has been released from wound care and denies any active infection- he reports taking PCN prophylactically- '500mg'$  BID (was previously taking Q6H during tx for active infection).  He needs to schedule f/u with ID. He reports that a stationary bike is being delivered today and he plans on riding several days a week-GREAT! He continues to avoid tobacco/ETOH use. He is still interested in referral to Bariatric surgery.  10/16/17 OV: [name] is here for HTN, he was started on Lisinopril '10mg'$  4 weeks ago and he reports home readings: SBP 140, DBP 70-80s He denies CP/dyspnea at rest/palpitations/dizziness He has one acute complaint today- productive cough for >1.5 weeks that has been steadily worsening. He denies fever/night sweats/N/V/D  10/16/18 OV: [name] is here for f/u: HTN, HL Morbid Obesity. He has been following heart healthy diet,  performing home PT exercises, and increasing water intake. He has lost 22 lbs since last OV in Nov 2018- GREAT JOB! He reports reduced swelling/pain in lower extremities, he continues to wrap legs and denies active infection. He has upcoming appt with Bariatric clinic. He has been seen by Hemat/Onc- labs all re-assuring and has f/u next month. He denies CP/dyspnea/palpitations/HA/N/V/D. He reports medication compliance, denies SE. Overall he feels that he "is doing pretty okay".  Patient Care Team    Relationship Specialty Notifications Start End  Mina Marble D, NP PCP - General Family Medicine  01/16/17   Christin Fudge, MD Consulting Physician Surgery  01/16/17   Arta Silence, MD Consulting Physician Gastroenterology  01/16/17     Patient Active Problem List   Diagnosis Date Noted  . Other pancytopenia ([place]) 11/08/2017  . Upper respiratory tract infection 10/16/2017  . Healthcare maintenance 10/16/2017  . AKI (acute kidney injury) ([place]) 12/19/2016  . Hyponatremia 12/19/2016  . Pressure injury of skin 12/19/2016  . Encephalopathy, hepatic ([place]) 11/20/2015  . Hepatic cirrhosis ([place]) 11/20/2015  . Thrombocytopenia ([place])   . Stroke or transient ischemic attack (TIA) diagnosed during current admission 11/19/2015  . OSA on CPAP 05/02/2015  . Hemorrhagic cystitis 01/29/2015  . Lymphedema 01/29/2015  . Morbid obesity with body mass index of 50.0-59.9 in adult [facility]) 01/25/2015  . Obesity hypoventilation syndrome ([place]) 01/25/2015  . Snoring   . High cholesterol   . TIA (transient ischemic attack) 01/10/2015  . Encephalopathy acute  01/09/2015  . HTN (hypertension) 01/09/2015  . Borderline diabetic 01/09/2015  . GERD (gastroesophageal reflux disease) 01/09/2015  . Acute encephalopathy      Past Medical History:  Diagnosis Date  . Cellulitis   . Cirrhosis, nonalcoholic ([place])   . GERD (gastroesophageal reflux disease)   . Headache    "maybe monthly" (12/19/2016)  . Hepatic  encephalopathy ([place]) ~ 2016 X 2   "first time they thought it was a TIA; dx'd hepatic encephalopathy the 2nd time it happened"  . High cholesterol   . Hypertension   . Kidney stones 01/2015   UTI  . Migraine    "none in years; used to have them frequently" (12/19/2016)  . Morbid obesity with body mass index of 50.0-59.9 in adult [facility]) 01/25/2015  . OSA on CPAP   . Pre-diabetes   . Snoring   . Venous stasis      Past Surgical History:  Procedure Laterality Date  . ESOPHAGOGASTRODUODENOSCOPY (EGD) WITH PROPOFOL N/A 07/26/2015   Procedure: ESOPHAGOGASTRODUODENOSCOPY (EGD) WITH PROPOFOL;  Surgeon: Arta Silence, MD;  Location: [facility] ENDOSCOPY;  Service: Endoscopy;  Laterality: N/A;  . TEE WITHOUT CARDIOVERSION N/A 04/03/2015   Procedure: TRANSESOPHAGEAL ECHOCARDIOGRAM (TEE);  Surgeon: Adrian Prows, MD;  Location: [facility] ENDOSCOPY;  Service: Cardiovascular;  Laterality: N/A;  . WISDOM TOOTH EXTRACTION  1990     Family History  Problem Relation Age of Onset  . Leukemia Mother   . Diabetes Father   . Heart disease Maternal Grandmother   . Heart disease Paternal Grandmother   . Dementia Paternal Grandmother   . Diabetes Paternal Grandmother   . Frontotemporal dementia Paternal Grandmother   . COPD Maternal Grandfather   . Heart disease Paternal Grandfather   . Diabetes Paternal Grandfather      Social History   Substance and Sexual Activity  Drug Use No     Social History   Substance and Sexual Activity  Alcohol Use Yes  . Alcohol/week: 0.6 - 1.2 oz  . Types: 1 - 2 Standard drinks or equivalent per week   Comment: once a year     Social History   Tobacco Use  Smoking Status Never Smoker  Smokeless Tobacco Never Used     Outpatient Encounter Medications as of 12/16/2017  Medication Sig  . acetaminophen (TYLENOL) 500 MG tablet Take 1,000 mg by mouth every 6 (six) hours as needed for headache.  Marland Kitchen aspirin EC 81 MG EC tablet Take 1 tablet (81 mg total) by mouth daily.  Marland Kitchen  atorvastatin (LIPITOR) 20 MG tablet Take 1 tablet (20 mg total) by mouth at bedtime.  . blood glucose meter kit and supplies KIT Dispense based on patient and insurance preference. Use up to four times daily as directed. (FOR ICD-9 250.00, 250.01).  Marland Kitchen dexlansoprazole (DEXILANT) 60 MG capsule Take 1 capsule (60 mg total) by mouth daily.  . furosemide (LASIX) 40 MG tablet TAKE 1 TABLET BY MOUTH DAILY.  Marland Kitchen lactulose, encephalopathy, (CHRONULAC) 10 GM/15ML SOLN Take 45 mls by mouth 3 times a day  . lisinopril (PRINIVIL,ZESTRIL) 10 MG tablet Take 10 mg by mouth daily.  Marland Kitchen loratadine (CLARITIN) 10 MG tablet Take 10 mg by mouth daily as needed for allergies.  . Multiple Vitamin (MULTIVITAMIN WITH MINERALS) TABS tablet Take 1 tablet by mouth daily.  . penicillin v potassium (VEETID) 500 MG tablet Take 500 mg by mouth 2 (two) times daily.  . rifaximin (XIFAXAN) 550 MG TABS tablet Take 1 tablet (550 mg total) by mouth  2 (two) times daily.  Marland Kitchen saccharomyces boulardii (FLORASTOR) 250 MG capsule Take 1 capsule (250 mg total) by mouth 2 (two) times daily.  . sertraline (ZOLOFT) 50 MG tablet Take 1 tablet by mouth daily.  . Vitamin D, Ergocalciferol, (DRISDOL) 50000 units CAPS capsule Take 1 capsule (50,000 Units total) by mouth every 7 (seven) days.  . [DISCONTINUED] lisinopril (PRINIVIL,ZESTRIL) 20 MG tablet Take 0.5 tablets (10 mg total) daily by mouth. (Patient taking differently: Take 20 mg by mouth daily. )   No facility-administered encounter medications on file as of 12/16/2017.     Allergies: Patient has no known allergies.  Body mass index is 60.24 kg/m.  Blood pressure 133/71, pulse 65, height '6\' 4"'$  (1.93 m), weight (!) 494 lb 14.4 oz (224.5 kg), SpO2 99 %.     Review of Systems  Constitutional: Positive for fatigue. Negative for activity change, appetite change, chills and diaphoresis.  HENT: Negative for congestion.   Respiratory: Negative for cough, chest tightness, shortness of breath,  wheezing and stridor.   Cardiovascular: Negative for chest pain, palpitations and leg swelling.  Gastrointestinal: Negative for abdominal distention, abdominal pain, blood in stool, constipation, diarrhea, nausea and vomiting.  Musculoskeletal: Positive for arthralgias, back pain, gait problem, joint swelling and myalgias.  Skin: Negative for color change, pallor, rash and wound.  Neurological: Negative for dizziness and headaches.  Psychiatric/Behavioral: Positive for sleep disturbance.       Objective:   Physical Exam  Constitutional: He is oriented to person, place, and time. He appears well-developed and well-nourished. No distress.  HENT:  Head: Normocephalic and atraumatic.  Right Ear: Hearing, tympanic membrane, external ear and ear canal normal. No decreased hearing is noted.  Left Ear: Hearing, tympanic membrane, external ear and ear canal normal. No decreased hearing is noted.  Mouth/Throat: Mucous membranes are normal.  Eyes: Conjunctivae are normal. Pupils are equal, round, and reactive to light.  Neck: Normal range of motion.  Cardiovascular: Normal rate, normal heart sounds and intact distal pulses.  Pulmonary/Chest: Effort normal and breath sounds normal. No respiratory distress. He has no decreased breath sounds. He has no wheezes. He has no rhonchi. He has no rales. He exhibits no tenderness.  Abdominal: Soft. Bowel sounds are normal. He exhibits no distension and no mass. There is no tenderness. There is no rebound, no guarding and no CVA tenderness. Hernia confirmed negative in the right inguinal area and confirmed negative in the left inguinal area.  Genitourinary: Testes normal.  Musculoskeletal: He exhibits edema and tenderness.       Right knee: He exhibits decreased range of motion and swelling.       Left knee: He exhibits decreased range of motion and swelling.       Right ankle: He exhibits decreased range of motion and swelling. He exhibits normal pulse.        Left ankle: He exhibits decreased range of motion. He exhibits normal pulse.       Right lower leg: He exhibits tenderness, swelling and edema.       Left lower leg: He exhibits tenderness, swelling and edema.       Right foot: There is decreased range of motion and swelling.       Left foot: There is decreased range of motion and swelling.  Lower extremities wrapped, edema appears to be much reduced from last OV  Lymphadenopathy:       Right: No inguinal adenopathy present.       Left: No  inguinal adenopathy present.  Neurological: He is alert and oriented to person, place, and time. Coordination normal.  Skin: Skin is warm and dry. He is not diaphoretic.  Psychiatric: He has a normal mood and affect. His behavior is normal. Judgment and thought content normal.  Nursing note and vitals reviewed.         Assessment & Plan:   1. Borderline diabetic   2. Hypertension, unspecified type   3. Thrombocytopenia ([place])   4. Healthcare maintenance   5. Morbid obesity with body mass index of 50.0-59.9 in adult [facility])     Thrombocytopenia ([place]) Followed by Hemat/Onc- next OV 01/2018  Healthcare maintenance Continue all medications as directed. Increase water intake, strive for at least 128 ounces/day.   Follow Heart Healthy diet Increase regular exercise.  Recommend at least 30 minutes daily, 5 days per week of walking, jogging, biking, swimming, YouTube/Pinterest workout videos. Continue follow-up with Hemat/Oncology as directed. GREAT GREAT GREAT JOB on the weight loss. Your A1c looks fantastic, we will only check annually now. Please let us know if you need any assistance with the Bariatric Clinic process. Follow-up for complete physical in 6 months.  Borderline diabetic Lab Results  Component Value Date   HGBA1C 4.9 12/16/2017   HGBA1C 5.2 09/16/2017   HGBA1C 4.9 04/22/2017   Will only check annually now  HTN (hypertension) BP at goal 133/71, HR 65 Continue Lisinopril '10mg'$   daily, Furosemide '40mg'$  daily Denies acute cardiac sx's.   Morbid obesity with body mass index of 50.0-59.9 in adult [facility]) Lost 22 lbs since last OV in 10/2017- GREAT! Has appt with Bariatric Clinic    FOLLOW-UP:  Return in about 6 months (around 06/15/2018) for CPE.

## 2017-12-16 ENCOUNTER — Ambulatory Visit: Payer: BLUE CROSS/BLUE SHIELD | Admitting: Adult Health

## 2017-12-16 ENCOUNTER — Encounter: Payer: Self-pay | Admitting: Adult Health

## 2017-12-16 VITALS — BP 133/71 | HR 65 | Ht 76.0 in | Wt >= 6400 oz

## 2017-12-16 DIAGNOSIS — Z6841 Body Mass Index (BMI) 40.0 and over, adult: Secondary | ICD-10-CM | POA: Diagnosis not present

## 2017-12-16 DIAGNOSIS — Z0000 Encounter for general adult medical examination without abnormal findings: Secondary | ICD-10-CM

## 2017-12-16 DIAGNOSIS — R7303 Prediabetes: Secondary | ICD-10-CM | POA: Diagnosis not present

## 2017-12-16 DIAGNOSIS — D696 Thrombocytopenia, unspecified: Secondary | ICD-10-CM

## 2017-12-16 DIAGNOSIS — I10 Essential (primary) hypertension: Secondary | ICD-10-CM

## 2017-12-16 LAB — POCT GLYCOSYLATED HEMOGLOBIN (HGB A1C): HEMOGLOBIN A1C: 4.9

## 2017-12-16 NOTE — Patient Instructions (Signed)
Heart-Healthy Eating Plan Many factors influence your heart health, including eating and exercise habits. Heart (coronary) risk increases with abnormal blood fat (lipid) levels. Heart-healthy meal planning includes limiting unhealthy fats, increasing healthy fats, and making other small dietary changes. This includes maintaining a healthy body weight to help keep lipid levels within a normal range. What is my plan? Your health care provider recommends that you:  Get no more than ____25___% of the total calories in your daily diet from fat.  Limit your intake of saturated fat to less than ___5___% of your total calories each day.  Limit the amount of cholesterol in your diet to less than _300__ mg per day.  What types of fat should I choose?  Choose healthy fats more often. Choose monounsaturated and polyunsaturated fats, such as olive oil and canola oil, flaxseeds, walnuts, almonds, and seeds.  Eat more omega-3 fats. Good choices include salmon, mackerel, sardines, tuna, flaxseed oil, and ground flaxseeds. Aim to eat fish at least two times each week.  Limit saturated fats. Saturated fats are primarily found in animal products, such as meats, butter, and cream. Plant sources of saturated fats include palm oil, palm kernel oil, and coconut oil.  Avoid foods with partially hydrogenated oils in them. These contain trans fats. Examples of foods that contain trans fats are stick margarine, some tub margarines, cookies, crackers, and other baked goods. What general guidelines do I need to follow?  Check food labels carefully to identify foods with trans fats or high amounts of saturated fat.  Fill one half of your plate with vegetables and green salads. Eat 4-5 servings of vegetables per day. A serving of vegetables equals 1 cup of raw leafy vegetables,  cup of raw or cooked cut-up vegetables, or  cup of vegetable juice.  Fill one fourth of your plate with whole grains. Look for the word  "whole" as the first word in the ingredient list.  Fill one fourth of your plate with lean protein foods.  Eat 4-5 servings of fruit per day. A serving of fruit equals one medium whole fruit,  cup of dried fruit,  cup of fresh, frozen, or canned fruit, or  cup of 100% fruit juice.  Eat more foods that contain soluble fiber. Examples of foods that contain this type of fiber are apples, broccoli, carrots, beans, peas, and barley. Aim to get 20-30 g of fiber per day.  Eat more home-cooked food and less restaurant, buffet, and fast food.  Limit or avoid alcohol.  Limit foods that are high in starch and sugar.  Avoid fried foods.  Cook foods by using methods other than frying. Baking, boiling, grilling, and broiling are all great options. Other fat-reducing suggestions include: ? Removing the skin from poultry. ? Removing all visible fats from meats. ? Skimming the fat off of stews, soups, and gravies before serving them. ? Steaming vegetables in water or broth.  Lose weight if you are overweight. Losing just 5-10% of your initial body weight can help your overall health and prevent diseases such as diabetes and heart disease.  Increase your consumption of nuts, legumes, and seeds to 4-5 servings per week. One serving of dried beans or legumes equals  cup after being cooked, one serving of nuts equals 1 ounces, and one serving of seeds equals  ounce or 1 tablespoon.  You may need to monitor your salt (sodium) intake, especially if you have high blood pressure. Talk with your health care provider or dietitian to get  more information about reducing sodium. What foods can I eat? Grains  Breads, including [place], white, pita, wheat, raisin, rye, oatmeal, and [place]. Tortillas that are neither fried nor made with lard or trans fat. Low-fat rolls, including hotdog and hamburger buns and English muffins. Biscuits. Muffins. Waffles. Pancakes. Light popcorn. Whole-grain cereals. Flatbread.  Melba toast. Pretzels. Breadsticks. Rusks. Low-fat snacks and crackers, including oyster, saltine, matzo, graham, animal, and rye. Rice and pasta, including brown rice and those that are made with whole wheat. Vegetables All vegetables. Fruits All fruits, but limit coconut. Meats and Other Protein Sources Lean, well-trimmed beef, veal, pork, and lamb. Chicken and [place] without skin. All fish and shellfish. Wild duck, rabbit, pheasant, and venison. Egg whites or low-cholesterol egg substitutes. Dried beans, peas, lentils, and tofu.Seeds and most nuts. Dairy Low-fat or nonfat cheeses, including ricotta, string, and mozzarella. Skim or 1% milk that is liquid, powdered, or evaporated. Buttermilk that is made with low-fat milk. Nonfat or low-fat yogurt. Beverages Mineral water. Diet carbonated beverages. Sweets and Desserts Sherbets and fruit ices. Honey, jam, marmalade, jelly, and syrups. Meringues and gelatins. Pure sugar candy, such as hard candy, jelly beans, gumdrops, mints, marshmallows, and small amounts of dark chocolate. [employer] Eat all sweets and desserts in moderation. Fats and Oils Nonhydrogenated (trans-free) margarines. Vegetable oils, including soybean, sesame, sunflower, olive, peanut, safflower, corn, canola, and cottonseed. Salad dressings or mayonnaise that are made with a vegetable oil. Limit added fats and oils that you use for cooking, baking, salads, and as spreads. Other Cocoa powder. Coffee and tea. All seasonings and condiments. The items listed above may not be a complete list of recommended foods or beverages. Contact your dietitian for more options. What foods are not recommended? Grains Breads that are made with saturated or trans fats, oils, or whole milk. Croissants. Butter rolls. Cheese breads. Sweet rolls. Donuts. Buttered popcorn. Chow mein noodles. High-fat crackers, such as cheese or butter crackers. Meats and Other Protein Sources Fatty meats, such  as hotdogs, short ribs, sausage, spareribs, bacon, ribeye roast or steak, and mutton. High-fat deli meats, such as salami and bologna. Caviar. Domestic duck and goose. Organ meats, such as kidney, liver, sweetbreads, brains, gizzard, chitterlings, and heart. Dairy Cream, sour cream, cream cheese, and creamed cottage cheese. Whole milk cheeses, including blue (bleu), Monte[place] Jack, [place], [place], [place], [place], [place], [place], [place], and [place]. Whole or 2% milk that is liquid, evaporated, or condensed. Whole buttermilk. Cream sauce or high-fat cheese sauce. Yogurt that is made from whole milk. Beverages Regular sodas and drinks with added sugar. Sweets and Desserts Frosting. Pudding. Cookies. Cakes other than angel food cake. Candy that has milk chocolate or white chocolate, hydrogenated fat, butter, coconut, or unknown ingredients. Buttered syrups. Full-fat ice cream or ice cream drinks. Fats and Oils Gravy that has suet, meat fat, or shortening. Cocoa butter, hydrogenated oils, palm oil, coconut oil, palm kernel oil. These can often be found in baked products, candy, fried foods, nondairy creamers, and whipped toppings. Solid fats and shortenings, including bacon fat, salt pork, lard, and butter. Nondairy cream substitutes, such as coffee creamers and sour cream substitutes. Salad dressings that are made of unknown oils, cheese, or sour cream. The items listed above may not be a complete list of foods and beverages to avoid. Contact your dietitian for more information. This information is not intended to replace advice given to you by your health care provider. Make sure you discuss any questions you have with your health care  provider. Document Released: 08/27/2008 Document Revised: 06/07/2016 Document Reviewed: 05/12/2014 Elsevier Interactive Patient Education  2018 [employer].   Exercising to [employer] Exercising can help you to lose weight. In order to lose weight through exercise,  you need to do vigorous-intensity exercise. You can tell that you are exercising with vigorous intensity if you are breathing very hard and fast and cannot hold a conversation while exercising. Moderate-intensity exercise helps to maintain your current weight. You can tell that you are exercising at a moderate level if you have a higher heart rate and faster breathing, but you are still able to hold a conversation. How often should I exercise? Choose an activity that you enjoy and set realistic goals. Your health care provider can help you to make an activity plan that works for you. Exercise regularly as directed by your health care provider. This may include:  Doing resistance training twice each week, such as: ? Push-ups. ? Sit-ups. ? Lifting weights. ? Using resistance bands.  Doing a given intensity of exercise for a given amount of time. Choose from these options: ? 150 minutes of moderate-intensity exercise every week. ? 75 minutes of vigorous-intensity exercise every week. ? A mix of moderate-intensity and vigorous-intensity exercise every week.  Children, pregnant women, people who are out of shape, people who are overweight, and older adults may need to consult a health care provider for individual recommendations. If you have any sort of medical condition, be sure to consult your health care provider before starting a new exercise program. What are some activities that can help me to lose weight?  Walking at a rate of at least 4.5 miles an hour.  Jogging or running at a rate of 5 miles per hour.  Biking at a rate of at least 10 miles per hour.  Lap swimming.  Roller-skating or in-line skating.  Cross-country skiing.  Vigorous competitive sports, such as football, basketball, and soccer.  Jumping rope.  Aerobic dancing. How can I be more active in my day-to-day activities?  Use the stairs instead of the elevator.  Take a walk during your lunch break.  If you drive,  park your car farther away from work or school.  If you take public transportation, get off one stop early and walk the rest of the way.  Make all of your phone calls while standing up and walking around.  Get up, stretch, and walk around every 30 minutes throughout the day. What guidelines should I follow while exercising?  Do not exercise so much that you hurt yourself, feel dizzy, or get very short of breath.  Consult your health care provider prior to starting a new exercise program.  Wear comfortable clothes and shoes with good support.  Drink plenty of water while you exercise to prevent dehydration or heat stroke. Body water is lost during exercise and must be replaced.  Work out until you breathe faster and your heart beats faster. This information is not intended to replace advice given to you by your health care provider. Make sure you discuss any questions you have with your health care provider. Document Released: 12/21/2010 Document Revised: 04/25/2016 Document Reviewed: 04/21/2014 Elsevier Interactive Patient Education  2018 [place] all medications as directed. Increase water intake, strive for at least 128 ounces/day.   Follow Heart Healthy diet Increase regular exercise.  Recommend at least 30 minutes daily, 5 days per week of walking, jogging, biking, swimming, YouTube/Pinterest workout videos. Continue follow-up with Hemat/Oncology as directed.  GREAT GREAT GREAT JOB on the weight loss. Your A1c looks fantastic, we will only check annually now. Please let us know if you need any assistance with the Bariatric Clinic process. Follow-up for complete physical in 6 months. GREAT TO SEE YOU!

## 2017-12-16 NOTE — Assessment & Plan Note (Signed)
Followed by Hemat/Onc- next OV 01/2018

## 2017-12-16 NOTE — Assessment & Plan Note (Addendum)
BP at goal 133/71, HR 65 Continue Lisinopril 10mg  daily, Furosemide 40mg  daily Denies acute cardiac sx's.

## 2017-12-16 NOTE — Assessment & Plan Note (Signed)
Continue all medications as directed. Increase water intake, strive for at least 128 ounces/day.   Follow Heart Healthy diet Increase regular exercise.  Recommend at least 30 minutes daily, 5 days per week of walking, jogging, biking, swimming, YouTube/Pinterest workout videos. Continue follow-up with Hemat/Oncology as directed. GREAT GREAT GREAT JOB on the weight loss. Your A1c looks fantastic, we will only check annually now. Please let us know if you need any assistance with the Bariatric Clinic process. Follow-up for complete physical in 6 months.

## 2017-12-16 NOTE — Assessment & Plan Note (Signed)
Lost 22 lbs since last OV in 10/2017- GREAT! Has appt with Bariatric Clinic

## 2017-12-16 NOTE — Assessment & Plan Note (Signed)
Lab Results  Component Value Date   HGBA1C 4.9 12/16/2017   HGBA1C 5.2 09/16/2017   HGBA1C 4.9 04/22/2017   Will only check annually now

## 2017-12-18 ENCOUNTER — Other Ambulatory Visit: Payer: Self-pay | Admitting: Adult Health

## 2018-01-08 ENCOUNTER — Encounter: Payer: Self-pay | Admitting: Hematology and Oncology

## 2018-01-08 ENCOUNTER — Inpatient Hospital Stay: Payer: BLUE CROSS/BLUE SHIELD

## 2018-01-08 ENCOUNTER — Telehealth: Payer: Self-pay | Admitting: Hematology and Oncology

## 2018-01-08 ENCOUNTER — Inpatient Hospital Stay: Payer: BLUE CROSS/BLUE SHIELD | Attending: Hematology and Oncology | Admitting: Hematology and Oncology

## 2018-01-08 VITALS — BP 144/52 | HR 76 | Temp 97.9°F | Resp 20 | Ht 76.0 in | Wt >= 6400 oz

## 2018-01-08 DIAGNOSIS — Z7982 Long term (current) use of aspirin: Secondary | ICD-10-CM | POA: Insufficient documentation

## 2018-01-08 DIAGNOSIS — D61818 Other pancytopenia: Secondary | ICD-10-CM | POA: Diagnosis not present

## 2018-01-08 DIAGNOSIS — I10 Essential (primary) hypertension: Secondary | ICD-10-CM | POA: Insufficient documentation

## 2018-01-08 DIAGNOSIS — R7303 Prediabetes: Secondary | ICD-10-CM | POA: Insufficient documentation

## 2018-01-08 DIAGNOSIS — G4733 Obstructive sleep apnea (adult) (pediatric): Secondary | ICD-10-CM | POA: Insufficient documentation

## 2018-01-08 DIAGNOSIS — R161 Splenomegaly, not elsewhere classified: Secondary | ICD-10-CM | POA: Diagnosis not present

## 2018-01-08 DIAGNOSIS — Z87442 Personal history of urinary calculi: Secondary | ICD-10-CM | POA: Insufficient documentation

## 2018-01-08 DIAGNOSIS — E7800 Pure hypercholesterolemia, unspecified: Secondary | ICD-10-CM | POA: Diagnosis not present

## 2018-01-08 DIAGNOSIS — Z79899 Other long term (current) drug therapy: Secondary | ICD-10-CM | POA: Diagnosis not present

## 2018-01-08 DIAGNOSIS — K766 Portal hypertension: Secondary | ICD-10-CM | POA: Diagnosis not present

## 2018-01-08 DIAGNOSIS — F329 Major depressive disorder, single episode, unspecified: Secondary | ICD-10-CM | POA: Diagnosis not present

## 2018-01-08 DIAGNOSIS — K219 Gastro-esophageal reflux disease without esophagitis: Secondary | ICD-10-CM | POA: Insufficient documentation

## 2018-01-08 DIAGNOSIS — K7460 Unspecified cirrhosis of liver: Secondary | ICD-10-CM | POA: Diagnosis not present

## 2018-01-08 LAB — CBC WITH DIFFERENTIAL (CANCER CENTER ONLY)
Basophils Absolute: 0.1 10*3/uL (ref 0.0–0.1)
Basophils Relative: 2 %
EOS PCT: 5 %
Eosinophils Absolute: 0.2 10*3/uL (ref 0.0–0.5)
HCT: 35 % — ABNORMAL LOW (ref 38.4–49.9)
Hemoglobin: 12.3 g/dL — ABNORMAL LOW (ref 13.0–17.1)
LYMPHS ABS: 1.6 10*3/uL (ref 0.9–3.3)
Lymphocytes Relative: 39 %
MCH: 34.6 pg — AB (ref 27.2–33.4)
MCHC: 35.1 g/dL (ref 32.0–36.0)
MCV: 98.6 fL — AB (ref 79.3–98.0)
MONO ABS: 0.8 10*3/uL (ref 0.1–0.9)
MONOS PCT: 19 %
Neutro Abs: 1.4 10*3/uL — ABNORMAL LOW (ref 1.5–6.5)
Neutrophils Relative %: 35 %
PLATELETS: 62 10*3/uL — AB (ref 140–400)
RBC: 3.55 MIL/uL — ABNORMAL LOW (ref 4.20–5.82)
RDW: 16.5 % — ABNORMAL HIGH (ref 11.0–14.6)
WBC Count: 4 10*3/uL (ref 4.0–10.3)

## 2018-01-08 LAB — COMPREHENSIVE METABOLIC PANEL
ALBUMIN: 2.2 g/dL — AB (ref 3.5–5.0)
ALT: 24 U/L (ref 0–55)
ANION GAP: 4 (ref 3–11)
AST: 57 U/L — AB (ref 5–34)
Alkaline Phosphatase: 120 U/L (ref 40–150)
BUN: 9 mg/dL (ref 7–26)
CO2: 26 mmol/L (ref 22–29)
Calcium: 7.9 mg/dL — ABNORMAL LOW (ref 8.4–10.4)
Chloride: 107 mmol/L (ref 98–109)
Creatinine, Ser: 0.82 mg/dL (ref 0.70–1.30)
GFR calc Af Amer: >60 mL/min (ref >60)
GFR calc non Af Amer: >60 mL/min (ref >60)
GLUCOSE: 103 mg/dL (ref 70–140)
POTASSIUM: 4.2 mmol/L (ref 3.5–5.1)
SODIUM: 137 mmol/L (ref 136–145)
TOTAL PROTEIN: 6.1 g/dL — AB (ref 6.4–8.3)
Total Bilirubin: 2.7 mg/dL — ABNORMAL HIGH (ref 0.2–1.2)

## 2018-01-08 LAB — RETICULOCYTES
RBC.: 3.55 MIL/uL — ABNORMAL LOW (ref 4.20–5.82)
RETIC CT PCT: 1.4 % (ref 0.8–1.8)
Retic Count, Absolute: 49.7 10*3/uL (ref 34.8–93.9)

## 2018-01-08 LAB — URIC ACID: URIC ACID, SERUM: 3.5 mg/dL (ref 2.6–7.4)

## 2018-01-08 LAB — LACTATE DEHYDROGENASE: LDH: 244 U/L (ref 125–245)

## 2018-01-08 NOTE — Telephone Encounter (Signed)
Patient will call to schedule in 2 weeks

## 2018-01-12 ENCOUNTER — Other Ambulatory Visit: Payer: Self-pay | Admitting: Adult Health

## 2018-01-13 ENCOUNTER — Other Ambulatory Visit: Payer: Self-pay

## 2018-01-13 MED ORDER — SERTRALINE HCL 50 MG PO TABS: 50.0000 mg | ORAL_TABLET | Freq: Every day | ORAL | 1 refills | Status: AC

## 2018-01-13 NOTE — Telephone Encounter (Signed)
We have not prescribed these medications for the patient previously.  Please review and refill if appropriate.  T. Justyn Boyson, CMA  

## 2018-01-19 ENCOUNTER — Other Ambulatory Visit: Payer: Self-pay | Admitting: Radiology

## 2018-01-20 ENCOUNTER — Ambulatory Visit (HOSPITAL_COMMUNITY)
Admission: RE | Admit: 2018-01-20 | Discharge: 2018-01-20 | Disposition: A | Discharge status: Home / Self Care | Payer: BLUE CROSS/BLUE SHIELD | Source: Ambulatory Visit | Attending: Hematology and Oncology | Admitting: Hematology and Oncology

## 2018-01-20 ENCOUNTER — Encounter (HOSPITAL_COMMUNITY): Payer: Self-pay

## 2018-01-20 DIAGNOSIS — Z6841 Body Mass Index (BMI) 40.0 and over, adult: Secondary | ICD-10-CM | POA: Diagnosis not present

## 2018-01-20 DIAGNOSIS — R161 Splenomegaly, not elsewhere classified: Secondary | ICD-10-CM | POA: Insufficient documentation

## 2018-01-20 DIAGNOSIS — E7800 Pure hypercholesterolemia, unspecified: Secondary | ICD-10-CM | POA: Insufficient documentation

## 2018-01-20 DIAGNOSIS — I10 Essential (primary) hypertension: Secondary | ICD-10-CM | POA: Diagnosis not present

## 2018-01-20 DIAGNOSIS — D61818 Other pancytopenia: Secondary | ICD-10-CM | POA: Diagnosis not present

## 2018-01-20 DIAGNOSIS — R7303 Prediabetes: Secondary | ICD-10-CM | POA: Diagnosis not present

## 2018-01-20 DIAGNOSIS — Z7982 Long term (current) use of aspirin: Secondary | ICD-10-CM | POA: Diagnosis not present

## 2018-01-20 DIAGNOSIS — K219 Gastro-esophageal reflux disease without esophagitis: Secondary | ICD-10-CM | POA: Diagnosis not present

## 2018-01-20 DIAGNOSIS — G4733 Obstructive sleep apnea (adult) (pediatric): Secondary | ICD-10-CM | POA: Diagnosis not present

## 2018-01-20 DIAGNOSIS — Z87442 Personal history of urinary calculi: Secondary | ICD-10-CM | POA: Insufficient documentation

## 2018-01-20 DIAGNOSIS — K7581 Nonalcoholic steatohepatitis (NASH): Secondary | ICD-10-CM | POA: Insufficient documentation

## 2018-01-20 DIAGNOSIS — Z79899 Other long term (current) drug therapy: Secondary | ICD-10-CM | POA: Diagnosis not present

## 2018-01-20 HISTORY — DX: Dyspnea, unspecified: R06.00

## 2018-01-20 LAB — CBC WITH DIFFERENTIAL/PLATELET
BASOS ABS: 0.1 10*3/uL (ref 0.0–0.1)
Basophils Relative: 3 %
EOS PCT: 0 %
Eosinophils Absolute: 0 10*3/uL (ref 0.0–0.7)
HCT: 35.8 % — ABNORMAL LOW (ref 39.0–52.0)
Hemoglobin: 12.8 g/dL — ABNORMAL LOW (ref 13.0–17.0)
LYMPHS ABS: 1.6 10*3/uL (ref 0.7–4.0)
Lymphocytes Relative: 38 %
MCH: 34.7 pg — ABNORMAL HIGH (ref 26.0–34.0)
MCHC: 35.8 g/dL (ref 30.0–36.0)
MCV: 97 fL (ref 78.0–100.0)
MONO ABS: 0.7 10*3/uL (ref 0.1–1.0)
Monocytes Relative: 17 %
NEUTROS ABS: 1.8 10*3/uL (ref 1.7–7.7)
Neutrophils Relative %: 42 %
PLATELETS: 73 10*3/uL — AB (ref 150–400)
RBC: 3.69 MIL/uL — AB (ref 4.22–5.81)
RDW: 16.4 % — AB (ref 11.5–15.5)
WBC: 4.2 10*3/uL (ref 4.0–10.5)

## 2018-01-20 LAB — PROTIME-INR
INR: 1.69
Prothrombin Time: 19.8 seconds — ABNORMAL HIGH (ref 11.4–15.2)

## 2018-01-20 MED ORDER — MIDAZOLAM HCL 2 MG/2ML IJ SOLN
INTRAMUSCULAR | Status: AC
  Filled 2018-01-20: qty 4

## 2018-01-20 MED ORDER — MIDAZOLAM HCL 2 MG/2ML IJ SOLN
INTRAMUSCULAR | Status: AC | PRN
  Administered 2018-01-20: 1 mg via INTRAVENOUS
  Administered 2018-01-20 (×2): 0.5 mg via INTRAVENOUS

## 2018-01-20 MED ORDER — SODIUM CHLORIDE 0.9 % IV SOLN
INTRAVENOUS | Status: DC
  Administered 2018-01-20: 09:00:00 via INTRAVENOUS

## 2018-01-20 MED ORDER — FENTANYL CITRATE (PF) 100 MCG/2ML IJ SOLN
INTRAMUSCULAR | Status: AC
  Filled 2018-01-20: qty 4

## 2018-01-20 MED ORDER — FENTANYL CITRATE (PF) 100 MCG/2ML IJ SOLN
INTRAMUSCULAR | Status: AC | PRN
  Administered 2018-01-20: 50 ug via INTRAVENOUS
  Administered 2018-01-20 (×2): 25 ug via INTRAVENOUS

## 2018-01-20 MED ORDER — LIDOCAINE HCL 1 % IJ SOLN
INTRAMUSCULAR | Status: AC | PRN
  Administered 2018-01-20 (×2): 10 mL via INTRADERMAL

## 2018-01-20 NOTE — Consult Note (Signed)
Chief Complaint: Patient was seen in consultation today for CT-guided bone marrow biopsy  Referring Physician(s): Perlov,Mikhail G  Supervising Physician: Daryll Brod  Patient Status: [facility] - Out-pt  History of Present Illness: Ryan Burgess is a 47 y.o. male with history of NASH cirrhosis, splenomegaly, mild anemia/thrombocytopenia and intermittent neutropenia who presents today for CT-guided bone marrow biopsy for further evaluation.   Past Medical History:  Diagnosis Date  . Cellulitis   . Cirrhosis, nonalcoholic ([place])   . Dyspnea    with walking   . GERD (gastroesophageal reflux disease)   . Headache    "maybe monthly" (12/19/2016)  . Hepatic encephalopathy ([place]) ~ 2016 X 2   "first time they thought it was a TIA; dx'd hepatic encephalopathy the 2nd time it happened"  . High cholesterol   . Hypertension   . Kidney stones 01/2015   UTI  . Migraine    "none in years; used to have them frequently" (12/19/2016)  . Morbid obesity with body mass index of 50.0-59.9 in adult [facility]) 01/25/2015  . OSA on CPAP   . Pre-diabetes   . Snoring   . Venous stasis     Past Surgical History:  Procedure Laterality Date  . ESOPHAGOGASTRODUODENOSCOPY (EGD) WITH PROPOFOL N/A 07/26/2015   Procedure: ESOPHAGOGASTRODUODENOSCOPY (EGD) WITH PROPOFOL;  Surgeon: Arta Silence, MD;  Location: [facility];  Service: Endoscopy;  Laterality: N/A;  . TEE WITHOUT CARDIOVERSION N/A 04/03/2015   Procedure: TRANSESOPHAGEAL ECHOCARDIOGRAM (TEE);  Surgeon: Adrian Prows, MD;  Location: [facility];  Service: Cardiovascular;  Laterality: N/A;  . VASECTOMY    . WISDOM TOOTH EXTRACTION  1990    Allergies: Patient has no known allergies.  Medications: Prior to Admission medications   Medication Sig Start Date End Date Taking? Authorizing Provider  acetaminophen (TYLENOL) 500 MG tablet Take 1,000 mg by mouth every 6 (six) hours as needed for headache.   Yes [provider]  aspirin EC 81 MG EC  tablet Take 1 tablet (81 mg total) by mouth daily. 01/10/15  Yes Rai, Ripudeep K, MD  atorvastatin (LIPITOR) 20 MG tablet Take 1 tablet (20 mg total) by mouth at bedtime. 09/08/17  Yes Danford, Valetta Fuller D, NP  dexlansoprazole (DEXILANT) 60 MG capsule Take 1 capsule (60 mg total) by mouth daily. 12/08/17  Yes Danford, Valetta Fuller D, NP  furosemide (LASIX) 40 MG tablet TAKE 1 TABLET BY MOUTH DAILY. 12/18/17  Yes Danford, Valetta Fuller D, NP  lactulose, encephalopathy, (CHRONULAC) 10 GM/15ML SOLN Take 45 mls by mouth 3 times a day 04/24/16  Yes [provider]  lisinopril (PRINIVIL,ZESTRIL) 10 MG tablet Take 10 mg by mouth daily.   Yes [provider]  Multiple Vitamin (MULTIVITAMIN WITH MINERALS) TABS tablet Take 1 tablet by mouth daily.   Yes [provider]  penicillin v potassium (VEETID) 500 MG tablet Take 500 mg by mouth 2 (two) times daily.   Yes [provider]  rifaximin (XIFAXAN) 550 MG TABS tablet Take 1 tablet (550 mg total) by mouth 2 (two) times daily. 06/14/16  Yes Geronimo Boot, MD  saccharomyces boulardii (FLORASTOR) 250 MG capsule Take 1 capsule (250 mg total) by mouth 2 (two) times daily. 01/27/17  Yes Danford, Valetta Fuller D, NP  sertraline (ZOLOFT) 50 MG tablet Take 1 tablet (50 mg total) by mouth daily. 01/13/18  Yes Danford, Berna Spare, NP  blood glucose meter kit and supplies KIT Dispense based on patient and insurance preference. Use up to four times daily as directed. (FOR ICD-9  250.00, 250.01). 01/11/15   Rai, Ripudeep K, MD  loratadine (CLARITIN) 10 MG tablet Take 10 mg by mouth daily as needed for allergies.    [provider]  Vitamin D, Ergocalciferol, (DRISDOL) 50000 units CAPS capsule TAKE 1 CAPSULE BY MOUTH EVERY 7 DAYS. 01/12/18   Esaw Grandchild, NP     Family History  Problem Relation Age of Onset  . Leukemia Mother   . Diabetes Father   . Heart disease Maternal Grandmother   . Heart disease Paternal Grandmother   . Dementia Paternal Grandmother   .  Diabetes Paternal Grandmother   . Frontotemporal dementia Paternal Grandmother   . COPD Maternal Grandfather   . Heart disease Paternal Grandfather   . Diabetes Paternal Grandfather     Social History   Socioeconomic History  . Marital status: Married    Spouse name: Ryan Burgess  . Number of children: 2  . Years of education: masters  . Highest education level: None  Social Needs  . Financial resource strain: None  . Food insecurity - worry: None  . Food insecurity - inability: None  . Transportation needs - medical: None  . Transportation needs - non-medical: None  Occupational History  . None  Tobacco Use  . Smoking status: Never Smoker  . Smokeless tobacco: Never Used  Substance and Sexual Activity  . Alcohol use: Yes    Alcohol/week: 0.6 - 1.2 oz    Types: 1 - 2 Standard drinks or equivalent per week    Comment: once a year  . Drug use: No  . Sexual activity: Yes    Birth control/protection: None  Other Topics Concern  . None  Social History Narrative   Patient lives at home with his wife Ryan Burgess) and two children.    Patient works full time at [employer] in [place].   Right handed.   Caffeine 1-2 sodas and coffee daily.      Review of Systems currently denies fever, headache, chest pain, dyspnea, cough, abdominal/back pain, nausea, vomiting or bleeding. Vital Signs: BP (!) 147/55 (BP Location: Right Arm)   Pulse 73   Temp 98.1 F (36.7 C) (Oral)   Resp 18   SpO2 99%   Physical Exam awake, alert.  Chest clear to auscultation bilaterally anteriorly.  Heart with regular rate and rhythm.  Abdomen obese, soft, positive bowel sounds, nontender.  Bilateral lower extremity edema noted  Imaging: No results found.  Labs:  CBC: Recent Labs    09/16/17 0905 10/20/17 1521 10/30/17 1558 01/08/18 1548  WBC 3.1* 4.3 3.8* 4.0  HGB 11.8* 13.3 13.4  --   HCT 35.9* 37.6* 39.4 35.0*  PLT 65* Clumped Platelets-- Unable to  determine 80* 62*    COAGS: No results for input(s): INR, APTT in the last 8760 hours.  BMP: Recent Labs    09/16/17 0905 10/20/17 1521 01/08/18 1548  NA 139 134* 137  K 3.9 4.1 4.2  CL 109*  --  107  CO2 19* 20* 26  GLUCOSE 85 100 103  BUN 9 9.3 9  CALCIUM 7.8* 8.1* 7.9*  CREATININE 0.58* 0.7 0.82  GFRNONAA 122  --  >60  GFRAA 141  --  >60    LIVER FUNCTION TESTS: Recent Labs    09/16/17 0905 10/20/17 1521 11/06/17 1421 01/08/18 1548  BILITOT 1.6* 2.36*  --  2.7*  AST 51* 60*  --  57*  ALT 21 29  --  24  ALKPHOS 103 126  --  120  PROT 5.7* 6.6 6.4 6.1*  ALBUMIN 2.1* 2.3*  --  2.2*    TUMOR MARKERS: No results for input(s): AFPTM, CEA, CA199, CHROMGRNA in the last 8760 hours.  Assessment and Plan: 46 y.o. male with history of NASH cirrhosis, splenomegaly, mild anemia/thrombocytopenia and intermittent neutropenia who presents today for CT-guided bone marrow biopsy for further evaluation.Risks and benefits discussed with the patient including, but not limited to bleeding, infection, damage to adjacent structures or low yield requiring additional tests.  All of the patient's questions were answered, patient is agreeable to proceed. Consent signed and in chart.     Thank you for this interesting consult.  I greatly enjoyed meeting CABLE FEARN and look forward to participating in their care.  A copy of this report was sent to the requesting provider on this date.  Electronically Signed: D. Rowe Robert, PA-C 01/20/2018, 9:14 AM   I spent a total of  20 minutes   in face to face in clinical consultation, greater than 50% of which was counseling/coordinating care for CT-guided bone marrow biopsy

## 2018-01-20 NOTE — Discharge Instructions (Signed)
You may take off dressing and bathe in 24 hours.  ° °Bone Marrow Aspiration and Bone Marrow Biopsy, Adult, Care After °This sheet gives you information about how to care for yourself after your procedure. Your health care provider may also give you more specific instructions. If you have problems or questions, contact your health care provider. °What can I expect after the procedure? °After the procedure, it is common to have: °· Mild pain and tenderness. °· Swelling. °· Bruising. ° °Follow these instructions at home: °· Take over-the-counter or prescription medicines only as told by your health care provider. °· Do not take baths, swim, or use a hot tub until your health care provider approves. Ask if you can take a shower or have a sponge bath. °· Follow instructions from your health care provider about how to take care of the puncture site. Make sure you: °? Wash your hands with soap and water before you change your bandage (dressing). If soap and water are not available, use hand sanitizer. °? Change your dressing as told by your health care provider. °· Check your puncture site every day for signs of infection. Check for: °? More redness, swelling, or pain. °? More fluid or blood. °? Warmth. °? Pus or a bad smell. °· Return to your normal activities as told by your health care provider. Ask your health care provider what activities are safe for you. °· Do not drive for 24 hours if you were given a medicine to help you relax (sedative). °· Keep all follow-up visits as told by your health care provider. This is important. °Contact a health care provider if: °· You have more redness, swelling, or pain around the puncture site. °· You have more fluid or blood coming from the puncture site. °· Your puncture site feels warm to the touch. °· You have pus or a bad smell coming from the puncture site. °· You have a fever. °· Your pain is not controlled with medicine. °This information is not intended to replace advice  given to you by your health care provider. Make sure you discuss any questions you have with your health care provider. °Document Released: 06/07/2005 Document Revised: 06/07/2016 Document Reviewed: 05/01/2016 °Elsevier Interactive Patient Education © 2018 [employer] ° ° ° °Moderate Conscious Sedation, Adult, Care After °These instructions provide you with information about caring for yourself after your procedure. Your health care provider may also give you more specific instructions. Your treatment has been planned according to current medical practices, but problems sometimes occur. Call your health care provider if you have any problems or questions after your procedure. °What can I expect after the procedure? °After your procedure, it is common: °· To feel sleepy for several hours. °· To feel clumsy and have poor balance for several hours. °· To have poor judgment for several hours. °· To vomit if you eat too soon. ° °Follow these instructions at home: °For at least 24 hours after the procedure: ° °· Do not: °? Participate in activities where you could fall or become injured. °? Drive. °? Use heavy machinery. °? Drink alcohol. °? Take sleeping pills or medicines that cause drowsiness. °? Make important decisions or sign legal documents. °? Take care of children on your own. °· Rest. °Eating and drinking °· Follow the diet recommended by your health care provider. °· If you vomit: °? Drink water, juice, or soup when you can drink without vomiting. °? Make sure you have little or no nausea before eating   eating solid foods. °General instructions °· Have a responsible adult stay with you until you are awake and alert. °· Take over-the-counter and prescription medicines only as told by your health care provider. °· If you smoke, do not smoke without supervision. °· Keep all follow-up visits as told by your health care provider. This is important. °Contact a health care provider if: °· You keep feeling nauseous or you  keep vomiting. °· You feel light-headed. °· You develop a rash. °· You have a fever. °Get help right away if: °· You have trouble breathing. °This information is not intended to replace advice given to you by your health care provider. Make sure you discuss any questions you have with your health care provider. °Document Released: 09/08/2013 Document Revised: 04/22/2016 Document Reviewed: 03/09/2016 °Elsevier Interactive Patient Education © 2018 [employer] ° °

## 2018-01-20 NOTE — Procedures (Signed)
Pancytopenia  S/p RT ILIAC BM ASP AND CORE BX  NO COMP STABLE EBL 0 PATH PENDING FULL REPORT IN PACS  

## 2018-01-23 ENCOUNTER — Telehealth: Payer: Self-pay | Admitting: Hematology and Oncology

## 2018-01-23 NOTE — Telephone Encounter (Signed)
Spoke to patient regarding upcoming march appointments.  °

## 2018-02-01 NOTE — Assessment & Plan Note (Signed)
47 y.o. male with nonalcoholic steatohepatitis leading to cirrhosis of the liver with portal hypertension and known splenomegaly referred to our clinic for evaluation of progressive thrombocytopenia.  Thrombocytopenia appears to be chronic dating back at least 2 years with baseline platelet count of around 80 with a recent drop of unknown duration down into the 60s.  No pathological bleeding at this time.  Concurrently, patient has had a decrease in his hemoglobin by about 2 g and significant drop in the white blood cell counts, but stable since February 2018.  Patient's lab work continues to demonstrate decreased white blood cell count, stable thrombocytopenia, but progressive anemia.  These changes are still most likely related to his underlying hepatic cirrhosis, but I would like to conduct a bone marrow biopsy to assess for possible concurrent presence of myelodysplasia.  Plan: -Consult interventional radiology for bone marrow biopsy. -Return to clinic 2 weeks after bone marrow biopsy to discuss the results.

## 2018-02-01 NOTE — Progress Notes (Signed)
[facility] Follow-up Visit:  Assessment: Other pancytopenia [facility]) 47 y.o. male with nonalcoholic steatohepatitis leading to cirrhosis of the liver with portal hypertension and known splenomegaly referred to our clinic for evaluation of progressive thrombocytopenia.  Thrombocytopenia appears to be chronic dating back at least 2 years with baseline platelet count of around 80 with a recent drop of unknown duration down into the 60s.  No pathological bleeding at this time.  Concurrently, patient has had a decrease in his hemoglobin by about 2 g and significant drop in the white blood cell counts, but stable since February 2018.  Patient's lab work continues to demonstrate decreased white blood cell count, stable thrombocytopenia, but progressive anemia.  These changes are still most likely related to his underlying hepatic cirrhosis, but I would like to conduct a bone marrow biopsy to assess for possible concurrent presence of myelodysplasia.  Plan: -Consult interventional radiology for bone marrow biopsy. -Return to clinic 2 weeks after bone marrow biopsy to discuss the results.  Voice recognition software was used and creation of this note. Despite my best effort at editing the text, some misspelling/errors may have occurred.  Orders Placed This Encounter  Procedures  . CT Biopsy    Standing Status:   Future    Number of Occurrences:   1    Standing Expiration Date:   01/08/2019    Order Specific Question:   Lab orders requested (DO NOT place separate lab orders, these will be automatically ordered during procedure specimen collection):    Answer:   Cytology - Non Pap    Comments:   Flow cytometry, cytogenetics, FISH (MDS), hold additional tube for additional studuies    Order Specific Question:   Lab orders requested (DO NOT place separate lab orders, these will be automatically ordered during procedure specimen collection):    Answer:   Surgical Pathology    Order  Specific Question:   Lab orders requested (DO NOT place separate lab orders, these will be automatically ordered during procedure specimen collection):    Answer:   Other    Order Specific Question:   Reason for Exam (SYMPTOM  OR DIAGNOSIS REQUIRED)    Answer:   Pancytopenia -- evaluating for possible leukemia/MDS    Order Specific Question:   Preferred imaging location?    Answer:   [facility]    Order Specific Question:   Radiology Contrast Protocol - do NOT remove file path    Answer:   \\[email or web address]  . CT BONE MARROW BIOPSY & ASPIRATION    Standing Status:   Future    Number of Occurrences:   1    Standing Expiration Date:   04/08/2019    Order Specific Question:   Reason for Exam (SYMPTOM  OR DIAGNOSIS REQUIRED)    Answer:   Pancytopenia, please eeval for underlying hematological process    Order Specific Question:   Preferred imaging location?    Answer:   [facility]    Order Specific Question:   Radiology Contrast Protocol - do NOT remove file path    Answer:   \\[email or web address]    All questions were answered.  . The patient knows to call the clinic with any problems, questions or concerns.  This note was electronically signed.    History of Presenting Illness Ryan Burgess is a 47 y.o. male followed in the [place] for thrombocytopenia, referred by Esaw Grandchild, NP.  Patient's past medical history is significant for nonalcoholic  steatohepatitis is with cirrhosis and portal hypertension with splenomegaly.  Previous history of encephalopathy, history of bilateral lower extremity cellulitis with sepsis in January and February 2018.  Hypertension, hyperlipidemia, morbid obesity, and depression.  Patient's medication list is significant for aspirin, atorvastatin, furosemide, prophylactic penicillin V, and chronic rifaximin for the past 2 years.  Patient is also recently receiving azithromycin since  last Friday and sertraline that has been started in February 2018.  Patient denies any active new complaints.  Denies any new epistaxis, gum bleeding, easy bruisability, hemoptysis, hematemesis, melena nycturia, or hematochezia.  No recurrence of encephalopathy in the past several months.  No recent severe infections.  Denies fevers, chills, night sweats.  No swelling of lymph nodes in the neck, armpits, or groin that the patient knows about.  Oncological/hematological History: --Labs, 12/19/16: WBC 15.6, Hgb 13.5, MCV 91.3, MCH 32.5, RDW 16.0, Plt 80; --Labs, 01/16/17: WBC   3.7, Hgb 11.9, MCV 95.4, MCH 32.5, RDW 16.8, Plt 83; --Labs, 09/17/17: WBC   3.1, Hgb 11.8, MCV 98.0, MCH 32.2, RDW 16.6, Plt 65 --Labs, 10/20/17: WBC   4.3, Hgb 13.3,               Plt Clumped; Fe 166, FeSat 91%, TIBC 181, Ferritin 131, Folate >20, Vit B12 >2,000; tBili 2.4, tProt 6.6, Alb 2.3, AP 126, AST 60, ALT 29, LDH 282, AFP 2.1; SPEP -- negative for monoclonal protein --Labs, 01/08/18: WBC   4.0, Hgb 12.3,                Plt 62   Medical History: Past Medical History:  Diagnosis Date  . Cellulitis   . Cirrhosis, nonalcoholic ([place])   . Dyspnea    with walking   . GERD (gastroesophageal reflux disease)   . Headache    "maybe monthly" (12/19/2016)  . Hepatic encephalopathy ([place]) ~ 2016 X 2   "first time they thought it was a TIA; dx'd hepatic encephalopathy the 2nd time it happened"  . High cholesterol   . Hypertension   . Kidney stones 01/2015   UTI  . Migraine    "none in years; used to have them frequently" (12/19/2016)  . Morbid obesity with body mass index of 50.0-59.9 in adult [facility]) 01/25/2015  . OSA on CPAP   . Pre-diabetes   . Snoring   . Venous stasis     Surgical History: Past Surgical History:  Procedure Laterality Date  . ESOPHAGOGASTRODUODENOSCOPY (EGD) WITH PROPOFOL N/A 07/26/2015   Procedure: ESOPHAGOGASTRODUODENOSCOPY (EGD) WITH PROPOFOL;  Surgeon: Arta Silence, MD;  Location: [facility]  ENDOSCOPY;  Service: Endoscopy;  Laterality: N/A;  . TEE WITHOUT CARDIOVERSION N/A 04/03/2015   Procedure: TRANSESOPHAGEAL ECHOCARDIOGRAM (TEE);  Surgeon: Adrian Prows, MD;  Location: [facility];  Service: Cardiovascular;  Laterality: N/A;  . VASECTOMY    . WISDOM TOOTH EXTRACTION  1990    Family History: Family History  Problem Relation Age of Onset  . Leukemia Mother   . Diabetes Father   . Heart disease Maternal Grandmother   . Heart disease Paternal Grandmother   . Dementia Paternal Grandmother   . Diabetes Paternal Grandmother   . Frontotemporal dementia Paternal Grandmother   . COPD Maternal Grandfather   . Heart disease Paternal Grandfather   . Diabetes Paternal Grandfather     Social History: Social History   Socioeconomic History  . Marital status: Married    Spouse name: Hoyle Sauer  . Number of children: 2  . Years of education: masters  . Highest  education level: Not on file  Social Needs  . Financial resource strain: Not on file  . Food insecurity - worry: Not on file  . Food insecurity - inability: Not on file  . Transportation needs - medical: Not on file  . Transportation needs - non-medical: Not on file  Occupational History  . Not on file  Tobacco Use  . Smoking status: Never Smoker  . Smokeless tobacco: Never Used  Substance and Sexual Activity  . Alcohol use: Yes    Alcohol/week: 0.6 - 1.2 oz    Types: 1 - 2 Standard drinks or equivalent per week    Comment: once a year  . Drug use: No  . Sexual activity: Yes    Birth control/protection: None  Other Topics Concern  . Not on file  Social History Narrative   Patient lives at home with his wife Hoyle Sauer) and two children.    Patient works full time at [employer] in [place].   Right handed.   Caffeine 1-2 sodas and coffee daily.    Allergies: No Known Allergies  Medications:  Current Outpatient Medications  Medication Sig Dispense Refill  .  acetaminophen (TYLENOL) 500 MG tablet Take 1,000 mg by mouth every 6 (six) hours as needed for headache.    Marland Kitchen aspirin EC 81 MG EC tablet Take 1 tablet (81 mg total) by mouth daily. 30 tablet 11  . atorvastatin (LIPITOR) 20 MG tablet Take 1 tablet (20 mg total) by mouth at bedtime. 90 tablet 0  . blood glucose meter kit and supplies KIT Dispense based on patient and insurance preference. Use up to four times daily as directed. (FOR ICD-9 250.00, 250.01). 1 each 0  . dexlansoprazole (DEXILANT) 60 MG capsule Take 1 capsule (60 mg total) by mouth daily. 90 capsule 0  . furosemide (LASIX) 40 MG tablet TAKE 1 TABLET BY MOUTH DAILY. 90 tablet 0  . lactulose, encephalopathy, (CHRONULAC) 10 GM/15ML SOLN Take 45 mls by mouth 3 times a day  3  . lisinopril (PRINIVIL,ZESTRIL) 10 MG tablet Take 10 mg by mouth daily.    Marland Kitchen loratadine (CLARITIN) 10 MG tablet Take 10 mg by mouth daily as needed for allergies.    . Multiple Vitamin (MULTIVITAMIN WITH MINERALS) TABS tablet Take 1 tablet by mouth daily.    . penicillin v potassium (VEETID) 500 MG tablet Take 500 mg by mouth 2 (two) times daily.    . rifaximin (XIFAXAN) 550 MG TABS tablet Take 1 tablet (550 mg total) by mouth 2 (two) times daily. 7 tablet 0  . saccharomyces boulardii (FLORASTOR) 250 MG capsule Take 1 capsule (250 mg total) by mouth 2 (two) times daily. 180 capsule 0  . sertraline (ZOLOFT) 50 MG tablet Take 1 tablet (50 mg total) by mouth daily. 90 tablet 1  . Vitamin D, Ergocalciferol, (DRISDOL) 50000 units CAPS capsule TAKE 1 CAPSULE BY MOUTH EVERY 7 DAYS. 16 capsule 2   No current facility-administered medications for this visit.     Review of Systems: Review of Systems  All other systems reviewed and are negative.    PHYSICAL EXAMINATION Blood pressure (!) 144/52, pulse 76, temperature 97.9 F (36.6 C), resp. rate 20, height '6\' 4"'$  (1.93 m), weight (!) 490 lb 8 oz (222.5 kg), SpO2 96 %.  ECOG PERFORMANCE STATUS: 0 -  Asymptomatic  Physical Exam  Constitutional: He is oriented to person, place, and time. No distress.  Morbidly obese  HENT:  Head: Normocephalic and atraumatic.  Mouth/Throat: Oropharynx is clear and moist. No oropharyngeal exudate.  Eyes: Conjunctivae and EOM are normal. Pupils are equal, round, and reactive to light. No scleral icterus.  Neck: No thyromegaly present.  Cardiovascular: Normal rate and regular rhythm.  No murmur heard. Pulmonary/Chest: Effort normal and breath sounds normal. No respiratory distress. He has no wheezes.  Abdominal: Soft. There is no tenderness. There is no guarding.  Large panus  Musculoskeletal: He exhibits edema.  Lymphadenopathy:    He has no cervical adenopathy.  Neurological: He is alert and oriented to person, place, and time. He has normal reflexes. No cranial nerve deficit.  Skin: Skin is warm and dry. He is not diaphoretic. No erythema.     LABORATORY DATA: I have personally reviewed the data as listed: Appointment on 01/08/2018  Component Date Value Ref Range Status  . Uric Acid, Serum 01/08/2018 3.5  2.6 - 7.4 mg/dL Final   Performed at [facility], [place] [address] [place], [place] [address]  . LDH 01/08/2018 244  125 - 245 U/L Final   Performed at [facility], [place] [address], [place], [place] [address]  . Sodium 01/08/2018 137  136 - 145 mmol/L Final  . Potassium 01/08/2018 4.2  3.5 - 5.1 mmol/L Final  . Chloride 01/08/2018 107  98 - 109 mmol/L Final  . CO2 01/08/2018 26  22 - 29 mmol/L Final  . Glucose, Bld 01/08/2018 103  70 - 140 mg/dL Final  . BUN 01/08/2018 9  7 - 26 mg/dL Final  . Creatinine, Ser 01/08/2018 0.82  0.70 - 1.30 mg/dL Final  . Calcium 01/08/2018 7.9* 8.4 - 10.4 mg/dL Final  . Total Protein 01/08/2018 6.1* 6.4 - 8.3 g/dL Final  . Albumin 01/08/2018 2.2* 3.5 - 5.0 g/dL Final  . AST 01/08/2018 57* 5 - 34 U/L Final  . ALT 01/08/2018 24  0 - 55 U/L Final  . Alkaline  Phosphatase 01/08/2018 120  40 - 150 U/L Final  . Total Bilirubin 01/08/2018 2.7* 0.2 - 1.2 mg/dL Final  . GFR calc non Af Amer 01/08/2018 >60  >60 mL/min Final  . GFR calc Af Amer 01/08/2018 >60  >60 mL/min Final   Comment: (NOTE) The eGFR has been calculated using the CKD EPI equation. This calculation has not been validated in all clinical situations. eGFR's persistently <60 mL/min signify possible Chronic Kidney Disease.   Georgiann Hahn gap 01/08/2018 4  3 - 11 Final   Performed at [facility], [place] [address], [place], [place] [address]  . WBC Count 01/08/2018 4.0  4.0 - 10.3 K/uL Final  . RBC 01/08/2018 3.55* 4.20 - 5.82 MIL/uL Final  . Hemoglobin 01/08/2018 12.3* 13.0 - 17.1 g/dL Final  . HCT 01/08/2018 35.0* 38.4 - 49.9 % Final  . MCV 01/08/2018 98.6* 79.3 - 98.0 fL Final  . MCH 01/08/2018 34.6* 27.2 - 33.4 pg Final  . MCHC 01/08/2018 35.1  32.0 - 36.0 g/dL Final  . RDW 01/08/2018 16.5* 11.0 - 14.6 % Final  . Platelet Count 01/08/2018 62* 140 - 400 K/uL Final  . Neutrophils Relative % 01/08/2018 35  % Final  . Neutro Abs 01/08/2018 1.4* 1.5 - 6.5 K/uL Final  . Lymphocytes Relative 01/08/2018 39  % Final  . Lymphs Abs 01/08/2018 1.6  0.9 - 3.3 K/uL Final  . Monocytes Relative 01/08/2018 19  % Final  . Monocytes Absolute 01/08/2018 0.8  0.1 - 0.9  K/uL Final  . Eosinophils Relative 01/08/2018 5  % Final  . Eosinophils Absolute 01/08/2018 0.2  0.0 - 0.5 K/uL Final  . Basophils Relative 01/08/2018 2  % Final  . Basophils Absolute 01/08/2018 0.1  0.0 - 0.1 K/uL Final   Performed at [facility], [place] [address], [place], [place] [address]  . Retic Ct Pct 01/08/2018 1.4  0.8 - 1.8 % Final  . RBC. 01/08/2018 3.55* 4.20 - 5.82 MIL/uL Final  . Retic Count, Absolute 01/08/2018 49.7  34.8 - 93.9 K/uL Final   Performed at [facility], [place] [address], [place], [place] [address]       Ardath Sax, MD

## 2018-02-02 ENCOUNTER — Inpatient Hospital Stay: Payer: BLUE CROSS/BLUE SHIELD

## 2018-02-02 ENCOUNTER — Encounter: Payer: Self-pay | Admitting: Hematology and Oncology

## 2018-02-02 ENCOUNTER — Inpatient Hospital Stay: Payer: BLUE CROSS/BLUE SHIELD | Attending: Hematology and Oncology | Admitting: Hematology and Oncology

## 2018-02-02 ENCOUNTER — Other Ambulatory Visit: Payer: Self-pay

## 2018-02-02 VITALS — BP 159/33 | HR 85 | Temp 98.0°F | Resp 17 | Ht 76.0 in | Wt >= 6400 oz

## 2018-02-02 DIAGNOSIS — Z7982 Long term (current) use of aspirin: Secondary | ICD-10-CM | POA: Diagnosis not present

## 2018-02-02 DIAGNOSIS — L03115 Cellulitis of right lower limb: Secondary | ICD-10-CM | POA: Diagnosis not present

## 2018-02-02 DIAGNOSIS — K7290 Hepatic failure, unspecified without coma: Secondary | ICD-10-CM | POA: Insufficient documentation

## 2018-02-02 DIAGNOSIS — E7800 Pure hypercholesterolemia, unspecified: Secondary | ICD-10-CM | POA: Insufficient documentation

## 2018-02-02 DIAGNOSIS — E785 Hyperlipidemia, unspecified: Secondary | ICD-10-CM | POA: Insufficient documentation

## 2018-02-02 DIAGNOSIS — F329 Major depressive disorder, single episode, unspecified: Secondary | ICD-10-CM | POA: Diagnosis not present

## 2018-02-02 DIAGNOSIS — K219 Gastro-esophageal reflux disease without esophagitis: Secondary | ICD-10-CM | POA: Insufficient documentation

## 2018-02-02 DIAGNOSIS — K766 Portal hypertension: Secondary | ICD-10-CM | POA: Diagnosis not present

## 2018-02-02 DIAGNOSIS — K7460 Unspecified cirrhosis of liver: Secondary | ICD-10-CM | POA: Insufficient documentation

## 2018-02-02 DIAGNOSIS — R7303 Prediabetes: Secondary | ICD-10-CM | POA: Diagnosis not present

## 2018-02-02 DIAGNOSIS — I10 Essential (primary) hypertension: Secondary | ICD-10-CM | POA: Insufficient documentation

## 2018-02-02 DIAGNOSIS — D61818 Other pancytopenia: Secondary | ICD-10-CM | POA: Diagnosis not present

## 2018-02-02 DIAGNOSIS — K7581 Nonalcoholic steatohepatitis (NASH): Secondary | ICD-10-CM | POA: Insufficient documentation

## 2018-02-02 DIAGNOSIS — G4733 Obstructive sleep apnea (adult) (pediatric): Secondary | ICD-10-CM | POA: Insufficient documentation

## 2018-02-02 DIAGNOSIS — Z79899 Other long term (current) drug therapy: Secondary | ICD-10-CM | POA: Insufficient documentation

## 2018-02-02 DIAGNOSIS — Z87442 Personal history of urinary calculi: Secondary | ICD-10-CM | POA: Insufficient documentation

## 2018-02-02 DIAGNOSIS — L03116 Cellulitis of left lower limb: Secondary | ICD-10-CM | POA: Insufficient documentation

## 2018-02-02 DIAGNOSIS — R161 Splenomegaly, not elsewhere classified: Secondary | ICD-10-CM | POA: Diagnosis not present

## 2018-02-02 DIAGNOSIS — D500 Iron deficiency anemia secondary to blood loss (chronic): Secondary | ICD-10-CM

## 2018-02-02 LAB — IRON AND TIBC
IRON: 123 ug/dL (ref 45–182)
SATURATION RATIOS: 63 % — AB (ref 17.9–39.5)
TIBC: 195 ug/dL — AB (ref 250–450)
UIBC: 72 ug/dL

## 2018-02-02 LAB — FERRITIN: Ferritin: 157 ng/mL (ref 24–336)

## 2018-02-02 LAB — TRANSFERRIN: Transferrin: 142 mg/dL — ABNORMAL LOW (ref 180–329)

## 2018-02-03 LAB — SOLUBLE TRANSFERRIN RECEPTOR: Transferrin Receptor: 31.9 nmol/L — ABNORMAL HIGH (ref 12.2–27.3)

## 2018-02-04 ENCOUNTER — Encounter (HOSPITAL_COMMUNITY): Payer: Self-pay

## 2018-02-05 LAB — CHROMOSOME ANALYSIS, BONE MARROW

## 2018-02-05 LAB — TISSUE HYBRIDIZATION (BONE MARROW)-NCBH

## 2018-02-12 MED ORDER — FERROUS SULFATE 325 (65 FE) MG PO TBEC: 325.0000 mg | DELAYED_RELEASE_TABLET | Freq: Every day | ORAL | 3 refills | Status: AC

## 2018-02-12 NOTE — Progress Notes (Signed)
[place] [facility] Follow-up Visit:  Assessment: Other pancytopenia [facility]) 47 y.o. male with nonalcoholic steatohepatitis leading to cirrhosis of the liver with portal hypertension and known splenomegaly referred to our clinic for evaluation of progressive thrombocytopenia.  Thrombocytopenia appears to be chronic dating back at least 2 years with baseline platelet count of around 80 with a recent drop of unknown duration down into the 60s.  No pathological bleeding at this time.  Concurrently, patient has had a decrease in his hemoglobin by about 2 g and significant drop in the white blood cell counts, but stable since February 2018.  Patient's lab work continues to demonstrate decreased white blood cell count, stable thrombocytopenia, but progressive anemia.  Bone marrow biopsy was obtained demonstrating normal appearance of the bone marrow with preserved megakaryocytes suggesting platelet sequestration due to splenomegaly versus chronic ITP as the cause of thrombocytopenia.  Bone marrow demonstrates decreased iron stores with previous lab work showing elevated ferritin likely due to chronic liver dysfunction masking the possible presence of iron deficiency anemia.  Plan: -Additional labs today as outlined below -Return to clinic 4 months for continued hematological monitoring.  Voice recognition software was used and creation of this note. Despite my best effort at editing the text, some misspelling/errors may have occurred.  Orders Placed This Encounter  Procedures  . Iron and TIBC    Standing Status:   Future    Number of Occurrences:   1    Standing Expiration Date:   02/02/2019  . Ferritin    Standing Status:   Future    Number of Occurrences:   1    Standing Expiration Date:   02/02/2019  . Transferrin    Standing Status:   Future    Number of Occurrences:   1    Standing Expiration Date:   02/02/2019  . Soluble transferrin receptor    Standing Status:   Future    Number  of Occurrences:   1    Standing Expiration Date:   02/02/2019  . CBC with Differential (Cancer Center Only)    Standing Status:   Future    Standing Expiration Date:   02/03/2019  . CMP ([place] only)    Standing Status:   Future    Standing Expiration Date:   02/03/2019  . Lactate dehydrogenase (LDH)    Standing Status:   Future    Standing Expiration Date:   02/02/2019    All questions were answered.  . The patient knows to call the clinic with any problems, questions or concerns.  This note was electronically signed.    History of Presenting Illness Ryan Burgess is a 47 y.o. male followed in the [place] for thrombocytopenia, referred by Esaw Grandchild, NP.  Patient's past medical history is significant for nonalcoholic steatohepatitis is with cirrhosis and portal hypertension with splenomegaly.  Previous history of encephalopathy, history of bilateral lower extremity cellulitis with sepsis in January and February 2018.  Hypertension, hyperlipidemia, morbid obesity, and depression.  Patient's medication list is significant for aspirin, atorvastatin, furosemide, prophylactic penicillin V, and chronic rifaximin for the past 2 years.  Patient is also recently receiving azithromycin since last Friday and sertraline that has been started in February 2018.  Patient denies any active new complaints.  Denies any new epistaxis, gum bleeding, easy bruisability, hemoptysis, hematemesis, melena nycturia, or hematochezia.  No recurrence of encephalopathy in the past several months.  No recent severe infections.  Denies fevers, chills, night sweats.  No swelling of lymph nodes in the neck, armpits, or groin that the patient knows about.  Oncological/hematological History: --Labs, 12/19/16: WBC 15.6, Hgb 13.5, MCV 91.3, MCH 32.5, RDW 16.0, Plt 80; --Labs, 01/16/17: WBC   3.7, Hgb 11.9, MCV 95.4, MCH 32.5, RDW 16.8, Plt 83; --Labs, 09/17/17: WBC   3.1, Hgb 11.8, MCV 98.0, MCH 32.2, RDW 16.6,  Plt 65 --Labs, 10/20/17: WBC   4.3, Hgb 13.3,               Plt Clumped; Fe 166, FeSat 91%, TIBC 181, Ferritin 131, Folate >20, Vit B12 >2,000; tBili 2.4, tProt 6.6, Alb 2.3, AP 126, AST 60, ALT 29, LDH 282, AFP 2.1; SPEP -- negative for monoclonal protein --Labs, 01/08/18: WBC   4.0, Hgb 12.3,                Plt 62 --BM Bx, 01/20/18: Normocellular bone marrow with relative abundance of red blood cell precursors, decreased iron stores.   Medical History: Past Medical History:  Diagnosis Date  . Cellulitis   . Cirrhosis, nonalcoholic ([place])   . Dyspnea    with walking   . GERD (gastroesophageal reflux disease)   . Headache    "maybe monthly" (12/19/2016)  . Hepatic encephalopathy ([place]) ~ 2016 X 2   "first time they thought it was a TIA; dx'd hepatic encephalopathy the 2nd time it happened"  . High cholesterol   . Hypertension   . Kidney stones 01/2015   UTI  . Migraine    "none in years; used to have them frequently" (12/19/2016)  . Morbid obesity with body mass index of 50.0-59.9 in adult [facility]) 01/25/2015  . OSA on CPAP   . Pre-diabetes   . Snoring   . Venous stasis     Surgical History: Past Surgical History:  Procedure Laterality Date  . ESOPHAGOGASTRODUODENOSCOPY (EGD) WITH PROPOFOL N/A 07/26/2015   Procedure: ESOPHAGOGASTRODUODENOSCOPY (EGD) WITH PROPOFOL;  Surgeon: Arta Silence, MD;  Location: [facility];  Service: Endoscopy;  Laterality: N/A;  . TEE WITHOUT CARDIOVERSION N/A 04/03/2015   Procedure: TRANSESOPHAGEAL ECHOCARDIOGRAM (TEE);  Surgeon: Adrian Prows, MD;  Location: [place];  Service: Cardiovascular;  Laterality: N/A;  . VASECTOMY    . WISDOM TOOTH EXTRACTION  1990    Family History: Family History  Problem Relation Age of Onset  . Leukemia Mother   . Diabetes Father   . Heart disease Maternal Grandmother   . Heart disease Paternal Grandmother   . Dementia Paternal Grandmother   . Diabetes Paternal Grandmother   . Frontotemporal dementia Paternal  Grandmother   . COPD Maternal Grandfather   . Heart disease Paternal Grandfather   . Diabetes Paternal Grandfather     Social History: Social History   Socioeconomic History  . Marital status: Married    Spouse name: Hoyle Sauer  . Number of children: 2  . Years of education: masters  . Highest education level: Not on file  Social Needs  . Financial resource strain: Not on file  . Food insecurity - worry: Not on file  . Food insecurity - inability: Not on file  . Transportation needs - medical: Not on file  . Transportation needs - non-medical: Not on file  Occupational History  . Not on file  Tobacco Use  . Smoking status: Never Smoker  . Smokeless tobacco: Never Used  Substance and Sexual Activity  . Alcohol use: Yes    Alcohol/week: 0.6 - 1.2 oz    Types: 1 - 2 Standard  drinks or equivalent per week    Comment: once a year  . Drug use: No  . Sexual activity: Yes    Birth control/protection: None  Other Topics Concern  . Not on file  Social History Narrative   Patient lives at home with his wife Hoyle Sauer) and two children.    Patient works full time at [employer] in [place].   Right handed.   Caffeine 1-2 sodas and coffee daily.    Allergies: No Known Allergies  Medications:  Current Outpatient Medications  Medication Sig Dispense Refill  . acetaminophen (TYLENOL) 500 MG tablet Take 1,000 mg by mouth every 6 (six) hours as needed for headache.    Marland Kitchen aspirin EC 81 MG EC tablet Take 1 tablet (81 mg total) by mouth daily. 30 tablet 11  . atorvastatin (LIPITOR) 20 MG tablet Take 1 tablet (20 mg total) by mouth at bedtime. 90 tablet 0  . blood glucose meter kit and supplies KIT Dispense based on patient and insurance preference. Use up to four times daily as directed. (FOR ICD-9 250.00, 250.01). 1 each 0  . dexlansoprazole (DEXILANT) 60 MG capsule Take 1 capsule (60 mg total) by mouth daily. 90 capsule 0  . furosemide (LASIX) 40  MG tablet TAKE 1 TABLET BY MOUTH DAILY. 90 tablet 0  . lactulose, encephalopathy, (CHRONULAC) 10 GM/15ML SOLN Take 45 mls by mouth 3 times a day  3  . lisinopril (PRINIVIL,ZESTRIL) 10 MG tablet Take 10 mg by mouth daily.    Marland Kitchen loratadine (CLARITIN) 10 MG tablet Take 10 mg by mouth daily as needed for allergies.    . Multiple Vitamin (MULTIVITAMIN WITH MINERALS) TABS tablet Take 1 tablet by mouth daily.    . penicillin v potassium (VEETID) 500 MG tablet Take 500 mg by mouth 2 (two) times daily.    . rifaximin (XIFAXAN) 550 MG TABS tablet Take 1 tablet (550 mg total) by mouth 2 (two) times daily. 7 tablet 0  . saccharomyces boulardii (FLORASTOR) 250 MG capsule Take 1 capsule (250 mg total) by mouth 2 (two) times daily. 180 capsule 0  . sertraline (ZOLOFT) 50 MG tablet Take 1 tablet (50 mg total) by mouth daily. 90 tablet 1  . Vitamin D, Ergocalciferol, (DRISDOL) 50000 units CAPS capsule TAKE 1 CAPSULE BY MOUTH EVERY 7 DAYS. 16 capsule 2   No current facility-administered medications for this visit.     Review of Systems: Review of Systems  All other systems reviewed and are negative.    PHYSICAL EXAMINATION Blood pressure (!) 159/33, pulse 85, temperature 98 F (36.7 C), temperature source Oral, resp. rate 17, height _0  (1.93 m), weight (!) 506 lb 6.4 oz (229.7 kg), SpO2 98 %.  ECOG PERFORMANCE STATUS: 0 - Asymptomatic  Physical Exam  Constitutional: He is oriented to person, place, and time. No distress.  Morbidly obese  HENT:  Head: Normocephalic and atraumatic.  Mouth/Throat: Oropharynx is clear and moist. No oropharyngeal exudate.  Eyes: Conjunctivae and EOM are normal. Pupils are equal, round, and reactive to light. No scleral icterus.  Neck: No thyromegaly present.  Cardiovascular: Normal rate and regular rhythm.  No murmur heard. Pulmonary/Chest: Effort normal and breath sounds normal. No respiratory distress. He has no wheezes.  Abdominal: Soft. There is no tenderness.  There is no guarding.  Large panus  Musculoskeletal: He exhibits edema.  Lymphadenopathy:    He has no cervical adenopathy.  Neurological: He is alert and  oriented to person, place, and time. He has normal reflexes. No cranial nerve deficit.  Skin: Skin is warm and dry. He is not diaphoretic. No erythema.     LABORATORY DATA: I have personally reviewed the data as listed: Appointment on 02/02/2018  Component Date Value Ref Range Status  . Transferrin Receptor 02/02/2018 31.9* 12.2 - 27.3 nmol/L Final   Comment: (NOTE) Performed At: [facility] [place], [place] [id number] Rush Farmer MD [id number] Performed at [facility], [place] [address], [place], [place] [address]   . Transferrin 02/02/2018 142* 180 - 329 mg/dL Final   Performed at [facility], [place] [address] [place], [place] [address]  . Ferritin 02/02/2018 157  24 - 336 ng/mL Final   Performed at [facility], [place] [address], [place], [place] [address]  . Iron 02/02/2018 123  45 - 182 ug/dL Final  . TIBC 02/02/2018 195* 250 - 450 ug/dL Final  . Saturation Ratios 02/02/2018 63* 17.9 - 39.5 % Final  . UIBC 02/02/2018 72  ug/dL Final   Performed at [place] [address], [place], [place] [address]       Ardath Sax, MD

## 2018-02-12 NOTE — Assessment & Plan Note (Addendum)
47 y.o. male with nonalcoholic steatohepatitis leading to cirrhosis of the liver with portal hypertension and known splenomegaly referred to our clinic for evaluation of progressive thrombocytopenia.  Thrombocytopenia appears to be chronic dating back at least 2 years with baseline platelet count of around 80 with a recent drop of unknown duration down into the 60s.  No pathological bleeding at this time.  Concurrently, patient has had a decrease in his hemoglobin by about 2 g and significant drop in the white blood cell counts, but stable since February 2018.  Patient's lab work continues to demonstrate decreased white blood cell count, stable thrombocytopenia, but progressive anemia.  Bone marrow biopsy was obtained demonstrating normal appearance of the bone marrow with preserved megakaryocytes suggesting platelet sequestration due to splenomegaly versus chronic ITP as the cause of thrombocytopenia.  Bone marrow demonstrates decreased iron stores with previous lab work showing elevated ferritin likely due to chronic liver dysfunction masking the possible presence of iron deficiency anemia.  Accurate assessment of the iron stores is difficult in this situation.  Patient has normal to mildly elevated ferritin, decreased transferrin about elevated transferrin receptor.  Together, this may indicate presence of mild iron deficiency and oral iron replacement may be contemplated.  Plan: -Additional labs today as outlined below -Start ferrous sulfate 325 mg daily. -Return to clinic 4 months for continued hematological monitoring.

## 2018-02-26 ENCOUNTER — Other Ambulatory Visit: Payer: Self-pay | Admitting: Adult Health

## 2018-03-12 ENCOUNTER — Other Ambulatory Visit: Payer: Self-pay | Admitting: Adult Health

## 2018-03-16 ENCOUNTER — Telehealth: Payer: Self-pay | Admitting: Adult Health

## 2018-03-16 DIAGNOSIS — I890 Lymphedema, not elsewhere classified: Secondary | ICD-10-CM

## 2018-03-16 NOTE — Telephone Encounter (Signed)
Please advise.  T. Nelson, CMA 

## 2018-03-16 NOTE — Progress Notes (Deleted)
   Subjective:    Patient ID: Ryan Burgess, male    DOB: 1971-08-03, 47 y.o.   MRN: [id number]  HPI  [name] presents with     Review of Systems     Objective:   Physical Exam        Assessment & Plan:

## 2018-03-16 NOTE — Addendum Note (Signed)
Addended by: Fonnie Mu on: 03/16/2018 03:15 PM   Modules accepted: Orders

## 2018-03-16 NOTE — Telephone Encounter (Signed)
Afternoon Tonya, Can you please call Mr. Holness and ask reason for referral. Happy to place order, just want supporting information as to why. Thanks! Valetta Fuller

## 2018-03-16 NOTE — Telephone Encounter (Signed)
Pt is requesting referral to rehab for lymphedema and re-measure for compression stockings.   Referral ordered.  Charyl Bigger, CMA

## 2018-03-16 NOTE — Telephone Encounter (Signed)
Patient is requesting a referral to [facility]

## 2018-03-17 ENCOUNTER — Ambulatory Visit: Payer: BLUE CROSS/BLUE SHIELD | Admitting: Adult Health

## 2018-03-20 NOTE — Progress Notes (Signed)
Subjective:    Patient ID: Ryan Burgess, male    DOB: Oct 28, 1971, 47 y.o.   MRN: [id number]  HPI:  Ryan Burgess presents with rash over OS that developed 7 days ago.  He reports itching and burning rash that he has been treating with OTC Hydrocortisone cream. He denies change in vision or HA He denies drainage from rash. He denies CP/dyspnea/dizziness/palpitations.  Patient Care Team    Relationship Specialty Notifications Start End  Mina Marble D, NP PCP - General Family Medicine  01/16/17   Christin Fudge, MD Consulting Physician Surgery  01/16/17   Arta Silence, MD Consulting Physician Gastroenterology  01/16/17     Patient Active Problem List   Diagnosis Date Noted  . Herpes zoster without complication 18/84/1660  . Other pancytopenia ([place]) 11/08/2017  . Upper respiratory tract infection 10/16/2017  . Healthcare maintenance 10/16/2017  . AKI (acute kidney injury) ([place]) 12/19/2016  . Hyponatremia 12/19/2016  . Pressure injury of skin 12/19/2016  . Encephalopathy, hepatic ([place]) 11/20/2015  . Hepatic cirrhosis ([place]) 11/20/2015  . Thrombocytopenia ([place])   . Stroke or transient ischemic attack (TIA) diagnosed during current admission 11/19/2015  . OSA on CPAP 05/02/2015  . Hemorrhagic cystitis 01/29/2015  . Lymphedema 01/29/2015  . Morbid obesity with body mass index of 50.0-59.9 in adult [facility]) 01/25/2015  . Obesity hypoventilation syndrome ([place]) 01/25/2015  . Snoring   . High cholesterol   . TIA (transient ischemic attack) 01/10/2015  . Encephalopathy acute 01/09/2015  . HTN (hypertension) 01/09/2015  . Borderline diabetic 01/09/2015  . GERD (gastroesophageal reflux disease) 01/09/2015  . Acute encephalopathy      Past Medical History:  Diagnosis Date  . Cellulitis   . Cirrhosis, nonalcoholic ([place])   . Dyspnea    with walking   . GERD (gastroesophageal reflux disease)   . Headache    "maybe monthly" (12/19/2016)  . Hepatic encephalopathy ([place]) ~ 2016 X 2   "first time they thought it was a TIA; dx'd hepatic encephalopathy the 2nd time it happened"  . High cholesterol   . Hypertension   . Kidney stones 01/2015   UTI  . Migraine    "none in years; used to have them frequently" (12/19/2016)  . Morbid obesity with body mass index of 50.0-59.9 in adult [facility]) 01/25/2015  . OSA on CPAP   . Pre-diabetes   . Snoring   . Venous stasis      Past Surgical History:  Procedure Laterality Date  . ESOPHAGOGASTRODUODENOSCOPY (EGD) WITH PROPOFOL N/A 07/26/2015   Procedure: ESOPHAGOGASTRODUODENOSCOPY (EGD) WITH PROPOFOL;  Surgeon: Arta Silence, MD;  [facility];  Service: Endoscopy;  Laterality: N/A;  . TEE WITHOUT CARDIOVERSION N/A 04/03/2015   Procedure: TRANSESOPHAGEAL ECHOCARDIOGRAM (TEE);  Surgeon: Adrian Prows, MD;  Location: [facility];  Service: Cardiovascular;  Laterality: N/A;  . VASECTOMY    . WISDOM TOOTH EXTRACTION  1990     Family History  Problem Relation Age of Onset  . Leukemia Mother   . Diabetes Father   . Heart disease Maternal Grandmother   . Heart disease Paternal Grandmother   . Dementia Paternal Grandmother   . Diabetes Paternal Grandmother   . Frontotemporal dementia Paternal Grandmother   . COPD Maternal Grandfather   . Heart disease Paternal Grandfather   . Diabetes Paternal Grandfather      Social History   Substance and Sexual Activity  Drug Use No     Social History   Substance and Sexual Activity  Alcohol  Use Yes  . Alcohol/week: 0.6 - 1.2 oz  . Types: 1 - 2 Standard drinks or equivalent per week   Comment: once a year     Social History   Tobacco Use  Smoking Status Never Smoker  Smokeless Tobacco Never Used     Outpatient Encounter Medications as of 03/23/2018  Medication Sig  . acetaminophen (TYLENOL) 500 MG tablet Take 1,000 mg by mouth every 6 (six) hours as needed for headache.  Marland Kitchen aspirin EC 81 MG EC tablet Take 1 tablet (81 mg total) by mouth daily.  Marland Kitchen atorvastatin (LIPITOR)  20 MG tablet TAKE 1 TABLET BY MOUTH AT BEDTIME  . blood glucose meter kit and supplies KIT Dispense based on patient and insurance preference. Use up to four times daily as directed. (FOR ICD-9 250.00, 250.01).  . DEXILANT 60 MG capsule TAKE 1 CAPSULE BY MOUTH DAILY.  . furosemide (LASIX) 40 MG tablet TAKE 1 TABLET BY MOUTH DAILY.  Marland Kitchen lactulose, encephalopathy, (CHRONULAC) 10 GM/15ML SOLN Take 45 mls by mouth 3 times a day  . lisinopril (PRINIVIL,ZESTRIL) 10 MG tablet Take 10 mg by mouth daily.  Marland Kitchen loratadine (CLARITIN) 10 MG tablet Take 10 mg by mouth daily as needed for allergies.  . Multiple Vitamin (MULTIVITAMIN WITH MINERALS) TABS tablet Take 1 tablet by mouth daily.  . penicillin v potassium (VEETID) 500 MG tablet Take 500 mg by mouth 2 (two) times daily.  . rifaximin (XIFAXAN) 550 MG TABS tablet Take 1 tablet (550 mg total) by mouth 2 (two) times daily.  Marland Kitchen saccharomyces boulardii (FLORASTOR) 250 MG capsule Take 1 capsule (250 mg total) by mouth 2 (two) times daily.  . sertraline (ZOLOFT) 50 MG tablet Take 1 tablet (50 mg total) by mouth daily.  . Vitamin D, Ergocalciferol, (DRISDOL) 50000 units CAPS capsule TAKE 1 CAPSULE BY MOUTH EVERY 7 DAYS.  . ferrous sulfate 325 (65 FE) MG EC tablet Take 1 tablet (325 mg total) by mouth daily with breakfast.  . gabapentin (NEURONTIN) 300 MG capsule 1 tab day one.  1 tab every 12 hrs as needed day 2. 1 tab every 8 hrs as needed day three and stay at this frequency as needed.  . valACYclovir (VALTREX) 1000 MG tablet Take 1 tablet (1,000 mg total) by mouth 3 (three) times daily.   No facility-administered encounter medications on file as of 03/23/2018.     Allergies: Patient has no known allergies.  Body mass index is 61.29 kg/m.  Blood pressure 138/78, pulse 76, temperature 98.5 F (36.9 C), temperature source Oral, height '6\' 4"'$  (1.93 m), weight (!) 503 lb 8 oz (228.4 kg), SpO2 100 %.  Review of Systems  Constitutional: Positive for fatigue.  Negative for activity change, appetite change, chills, diaphoresis, fever and unexpected weight change.  Eyes: Negative for visual disturbance.  Respiratory: Negative for cough, chest tightness, shortness of breath, wheezing and stridor.   Cardiovascular: Negative for chest pain, palpitations and leg swelling.  Gastrointestinal: Negative for abdominal distention, abdominal pain, blood in stool, constipation, diarrhea, nausea and vomiting.  Skin: Positive for color change and rash. Negative for wound.  Neurological: Negative for dizziness and headaches.  Hematological: Does not bruise/bleed easily.       Objective:   Physical Exam  Constitutional: He is oriented to person, place, and time. He appears well-developed and well-nourished. No distress.  HENT:  Head: Normocephalic and atraumatic.  Right Ear: External ear normal. No decreased hearing is noted.  Left Ear: External ear normal. No  decreased hearing is noted.  Nose: No mucosal edema or rhinorrhea. Right sinus exhibits no maxillary sinus tenderness and no frontal sinus tenderness. Left sinus exhibits no maxillary sinus tenderness and no frontal sinus tenderness.  Mouth/Throat: Uvula is midline, oropharynx is clear and moist and mucous membranes are normal.  Eyes: Pupils are equal, round, and reactive to light. Conjunctivae are normal. Right eye exhibits no discharge. Left eye exhibits no discharge.  Neck: Normal range of motion. Neck supple.  Cardiovascular: Normal rate, regular rhythm, normal heart sounds and intact distal pulses.  No murmur heard. Pulmonary/Chest: Effort normal and breath sounds normal. No respiratory distress. He has no wheezes. He has no rales. He exhibits no tenderness.  Lymphadenopathy:    He has no cervical adenopathy.  Neurological: He is alert and oriented to person, place, and time.  Skin: Skin is warm and dry. Rash noted. No ecchymosis, no laceration and no petechiae noted. He is not diaphoretic. There is  erythema. No pallor.     Red rash with scabes noted above/below OS No open tissue/drainage/streaking noted. No excessive warmth noted.  Psychiatric: He has a normal mood and affect. His behavior is normal. Judgment and thought content normal.          Assessment & Plan:   1. Herpes zoster without complication     Herpes zoster without complication Please take Valacylovir 1g every 8 hrs for 7 days Please take Gabapentin as needed. Please use either OTC Acetaminophen or Ibuprofen as needed for additional pain control. Please make appt with your Ophthalmologist ASAP. Please call clinic with any questions/concerns    FOLLOW-UP:  Return if symptoms worsen or fail to improve.

## 2018-03-23 ENCOUNTER — Encounter: Payer: Self-pay | Admitting: Adult Health

## 2018-03-23 ENCOUNTER — Ambulatory Visit (INDEPENDENT_AMBULATORY_CARE_PROVIDER_SITE_OTHER): Payer: BLUE CROSS/BLUE SHIELD | Admitting: Adult Health

## 2018-03-23 VITALS — BP 138/78 | HR 76 | Temp 98.5°F | Ht 76.0 in | Wt >= 6400 oz

## 2018-03-23 DIAGNOSIS — B029 Zoster without complications: Secondary | ICD-10-CM | POA: Diagnosis not present

## 2018-03-23 MED ORDER — GABAPENTIN 300 MG PO CAPS
ORAL_CAPSULE | ORAL | 0 refills | Status: DC
Start: 2018-03-23 — End: 2018-04-09

## 2018-03-23 MED ORDER — VALACYCLOVIR HCL 1 G PO TABS: 1000.0000 mg | ORAL_TABLET | Freq: Three times a day (TID) | ORAL | 0 refills | Status: DC

## 2018-03-23 NOTE — Assessment & Plan Note (Signed)
Please take Valacylovir 1g every 8 hrs for 7 days Please take Gabapentin as needed. Please use either OTC Acetaminophen or Ibuprofen as needed for additional pain control. Please make appt with your Ophthalmologist ASAP. Please call clinic with any questions/concerns

## 2018-03-23 NOTE — Patient Instructions (Signed)
Shingles Shingles, which is also known as herpes zoster, is an infection that causes a painful skin rash and fluid-filled blisters. Shingles is not related to genital herpes, which is a sexually transmitted infection. Shingles only develops in people who:  Have had chickenpox.  Have received the chickenpox vaccine. (This is rare.)  What are the causes? Shingles is caused by varicella-zoster virus (VZV). This is the same virus that causes chickenpox. After exposure to VZV, the virus stays in the body in an inactive (dormant) state. Shingles develops if the virus reactivates. This can happen many years after the initial exposure to VZV. It is not known what causes this virus to reactivate. What increases the risk? People who have had chickenpox or received the chickenpox vaccine are at risk for shingles. Infection is more common in people who:  Are older than age 50.  Have a weakened defense (immune) system, such as those with HIV, AIDS, or cancer.  Are taking medicines that weaken the immune system, such as transplant medicines.  Are under great stress.  What are the signs or symptoms? Early symptoms of this condition include itching, tingling, and pain in an area on your skin. Pain may be described as burning, stabbing, or throbbing. A few days or weeks after symptoms start, a painful red rash appears, usually on one side of the body in a bandlike or beltlike pattern. The rash eventually turns into fluid-filled blisters that break open, scab over, and dry up in about 2-3 weeks. At any time during the infection, you may also develop:  A fever.  Chills.  A headache.  An upset stomach.  How is this diagnosed? This condition is diagnosed with a skin exam. Sometimes, skin or fluid samples are taken from the blisters before a diagnosis is made. These samples are examined under a microscope or sent to a lab for testing. How is this treated? There is no specific cure for this condition.  Your health care provider will probably prescribe medicines to help you manage pain, recover more quickly, and avoid long-term problems. Medicines may include:  Antiviral drugs.  Anti-inflammatory drugs.  Pain medicines.  If the area involved is on your face, you may be referred to a specialist, such as an eye doctor (ophthalmologist) or an ear, nose, and throat (ENT) doctor to help you avoid eye problems, chronic pain, or disability. Follow these instructions at home: Medicines  Take medicines only as directed by your health care provider.  Apply an anti-itch or numbing cream to the affected area as directed by your health care provider. Blister and Rash Care  Take a cool bath or apply cool compresses to the area of the rash or blisters as directed by your health care provider. This may help with pain and itching.  Keep your rash covered with a loose bandage (dressing). Wear loose-fitting clothing to help ease the pain of material rubbing against the rash.  Keep your rash and blisters clean with mild soap and cool water or as directed by your health care provider.  Check your rash every day for signs of infection. These include redness, swelling, and pain that lasts or increases.  Do not pick your blisters.  Do not scratch your rash. General instructions  Rest as directed by your health care provider.  Keep all follow-up visits as directed by your health care provider. This is important.  Until your blisters scab over, your infection can cause chickenpox in people who have never had it or been vaccinated   against it. To prevent this from happening, avoid contact with other people, especially: ? Babies. ? Pregnant women. ? Children who have eczema. ? Elderly people who have transplants. ? People who have chronic illnesses, such as leukemia or AIDS. Contact a health care provider if:  Your pain is not relieved with prescribed medicines.  Your pain does not get better after  the rash heals.  Your rash looks infected. Signs of infection include redness, swelling, and pain that lasts or increases. Get help right away if:  The rash is on your face or nose.  You have facial pain, pain around your eye area, or loss of feeling on one side of your face.  You have ear pain or you have ringing in your ear.  You have loss of taste.  Your condition gets worse. This information is not intended to replace advice given to you by your health care provider. Make sure you discuss any questions you have with your health care provider. Document Released: 11/18/2005 Document Revised: 07/14/2016 Document Reviewed: 09/29/2014 Elsevier Interactive Patient Education  2018 [employer].   Please take Valacylovir 1g every 8 hrs for 7 days Please take Gabapentin as needed. Please use either OTC Acetaminophen or Ibuprofen as needed for additional pain control. Please make appt with your Ophthalmologist ASAP. Please call clinic with any questions/concerns.  FEEL BETTER!

## 2018-03-25 ENCOUNTER — Other Ambulatory Visit: Payer: Self-pay | Admitting: Gastroenterology

## 2018-03-25 DIAGNOSIS — K7460 Unspecified cirrhosis of liver: Secondary | ICD-10-CM

## 2018-03-26 ENCOUNTER — Ambulatory Visit: Payer: BLUE CROSS/BLUE SHIELD | Attending: Adult Health | Admitting: Occupational Therapy

## 2018-03-26 ENCOUNTER — Encounter: Payer: Self-pay | Admitting: Occupational Therapy

## 2018-03-26 DIAGNOSIS — I890 Lymphedema, not elsewhere classified: Secondary | ICD-10-CM | POA: Diagnosis present

## 2018-03-26 NOTE — Patient Instructions (Signed)

## 2018-03-30 ENCOUNTER — Ambulatory Visit: Payer: BLUE CROSS/BLUE SHIELD | Admitting: Occupational Therapy

## 2018-03-31 ENCOUNTER — Ambulatory Visit: Payer: BLUE CROSS/BLUE SHIELD | Admitting: Occupational Therapy

## 2018-03-31 DIAGNOSIS — I890 Lymphedema, not elsewhere classified: Secondary | ICD-10-CM

## 2018-03-31 NOTE — Therapy (Signed)
[place] MAIN [facility] SERVICES [address] [place], [place], [address] Phone: [phone]   Fax:  [phone]  Occupational Therapy Treatment  Patient Details  Name: Ryan Burgess MRN: [id number] Date of Birth: 03/22/1971 Referring Provider: Esaw Grandchild, NP   Encounter Date: 03/31/2018  OT End of Session - 03/31/18 1849    Visit Number  2    Number of Visits  36    Date for OT Re-Evaluation  06/25/18    OT Start Time  0110    OT Stop Time  0210    OT Time Calculation (min)  60 min       Past Medical History:  Diagnosis Date  . Cellulitis   . Cirrhosis, nonalcoholic ([place])   . Dyspnea    with walking   . GERD (gastroesophageal reflux disease)   . Headache    "maybe monthly" (12/19/2016)  . Hepatic encephalopathy ([place]) ~ 2016 X 2   "first time they thought it was a TIA; dx'd hepatic encephalopathy the 2nd time it happened"  . High cholesterol   . Hypertension   . Kidney stones 01/2015   UTI  . Migraine    "none in years; used to have them frequently" (12/19/2016)  . Morbid obesity with body mass index of 50.0-59.9 in adult [facility]) 01/25/2015  . OSA on CPAP   . Pre-diabetes   . Snoring   . Venous stasis     Past Surgical History:  Procedure Laterality Date  . ESOPHAGOGASTRODUODENOSCOPY (EGD) WITH PROPOFOL N/A 07/26/2015   Procedure: ESOPHAGOGASTRODUODENOSCOPY (EGD) WITH PROPOFOL;  Surgeon: Arta Silence, MD;  Location: [facility];  Service: Endoscopy;  Laterality: N/A;  . TEE WITHOUT CARDIOVERSION N/A 04/03/2015   Procedure: TRANSESOPHAGEAL ECHOCARDIOGRAM (TEE);  Surgeon: Adrian Prows, MD;  Location: [facility];  Service: Cardiovascular;  Laterality: N/A;  . VASECTOMY    . WISDOM TOOTH EXTRACTION  1990    There were no vitals filed for this visit.  Subjective Assessment - 03/31/18 1844    Subjective   Pt presents for OT treatment visit 2 for CDT to BLE, w/ emphasis on LLE. Pt brings existing compression wraps for  inspection, and presents w/ custom garments in place. Pt has no new complaints since initial eval on 03/26/18. He missed initial visit scheduled during interval.    Pertinent History  Recent episode of LE cellulitis and sepsis w/ hospitalization; hx TIA, DM, morbid obesity, OSA, depression; non-smoker    Limitations  Difficulty walking, difficulty w/ functional mobility and transfers, decreased ability to perform basic and instrumental ADLs (LB dressing and bathing, fitting and donning shoes and socks, difficulty performing shoping and  all home management tasks requiring standing and walking) Difficulty performing productive and work activities, restricted social participation in the community    Repetition  Increases Symptoms    Patient Stated Goals  decrease leg swelling and keep it from getting worse so I can do more    Currently in Pain?  No/denies    Pain Onset  More than a month ago chronic         [facility] OT Assessment - 03/31/18 0001      Assessment   Medical Diagnosis  Severe, stage , BLE lymphedema, L>R    Referring Provider  Esaw Grandchild, NP    Hand Dominance  Right    Prior Therapy  OT for BLE CDT betewwn 02/13/2017 and 06/18/2017. Fitted w BLE custom compression garments and HOS devices.  Precautions   Precautions  Other (comment) DM skin precautions, LE precautions      Restrictions   Weight Bearing Restrictions  No      Home  Environment   Family/patient expects to be discharged to:  Private residence    Living Arrangements  Spouse/significant other    Available Help at Discharge  Family    Type of [place] with household mobility with device;Requires assistive device for independence;Needs assistance with ADLs;Needs assistance with homemaking;Needs assistance with gait;Needs assistance with transfers    Vocation  Unemployed    Vocation Requirements  sitting and working at computer, walking, standing       IADL   Shopping  Shops independently for small purchases    Light Housekeeping  [place] small items, rinses stockings, etc.    Meal Prep  Able to complete simple warm meal prep    Community Mobility  Drives own vehicle needs transport wc for distances    Medication Management  Takes responsibility if medication is prepared in advance in seperate dosage    Financial Management  Manages financial matters independently (budgets, writes checks, pays rent, bills goes to bank), collects and keeps track of income      Mobility   Mobility Status Comments  uses cane in the home, drives self      Activity Tolerance   Activity Tolerance Comments  pt reports decreased endurance for walking      Cognition   Overall Cognitive Status  Within Functional Limits for tasks assessed      Observation/Other Assessments   Observations  LLE appears to be ~ 25% greater in overall volume than RLE.    Skin Integrity  Skin severly dry and flaking, reddened, and indurated with dense fibrosis and peau de orange at medial and posterior thigh lobules.  Nail fungis and very deep creases at base of toes, L>R, indicatiive of years of progression. Skin is reddenend and cool to the touch. Pt has hx of lymphorrhea and non-healing leg ulcer, bot no weeping or open wounds observed today.      Posture/Postural Control   Posture/Postural Control  No significant limitations      Sensation   Light Touch  Appears Intact    Proprioception  Appears Intact      Coordination   Gross Motor Movements are Fluid and Coordinated  Yes    Fine Motor Movements are Fluid and Coordinated  Yes      ROM / Strength   AROM / PROM / Strength  AROM;PROM;Strength      AROM   Overall AROM   Deficits;Unable to assess    Overall AROM Comments  2/2 swelling      PROM   Overall PROM   Deficits      Strength   Overall Strength  Deficits      LYMPHEDEMA/ONCOLOGY QUESTIONNAIRE - 03/31/18 1820      Right Lower Extremity Lymphedema   Other   TBD      Left Lower Extremity Lymphedema   Other  TBD              OT Treatments/Exercises (OP) - 03/31/18 1846      ADLs   ADL Education Given  Yes      Manual Therapy   Manual Therapy  Edema management;Compression Bandaging    Manual therapy comments  completed LLE limb volumetrics. Data to be entered next  visit due to time constraints. Numbera increase predict a substantial limb volume increase of > 20%.,    Edema Management  assessed existing compression wraps. Most ureusable. Foam needs replacement.             OT Education - 03/31/18 1848    Education provided  Yes    Education Details  Continued skilled Pt/caregiver education  And LE ADL training throughout visit for lymphedema self care/ home program, including compression wrapping, compression garment and device wear/care, lymphatic pumping ther ex, simple self-MLD, and skin care. Discussed progress towards goals.     Person(s) Educated  Patient    Methods  Explanation;Demonstration;Handout    Comprehension  Verbalized understanding;Returned demonstration;Need further instruction          OT Long Term Goals - 03/26/18 1836      OT LONG TERM GOAL #1   Title  Pt independent w/ lymphedema precautions and prevention principals and strategies to limit LE progression and infection risk.    Baseline  dependent    Time  2    Period  Weeks    Status  New    Target Date  -- 10th OT visit      OT LONG TERM GOAL #2   Title  Pt will remove compression wraps daily to bathe, nspect skin and perform diligent skin care with max caregiver assistance  q 24 hours to limit infection risk and optimal compression effectiveness for limb volume reduction during Intensive Phase CDT.    Baseline  Dependent    Time  12    Period  Weeks    Status  New    Target Date  06/25/18      OT LONG TERM GOAL #3   Title  Lymphedema (LE) management/ self-care:  Pt to achieve at least 10% limb volume reductions bilaterally during  Intensive CDT to limit LE progression, to reduce pain, and to improve safe ambulation and functional mobility.    Baseline  in LLE    Time  12    Period  Weeks    Status  New    Target Date  06/25/18      OT LONG TERM GOAL #4   Title  Lymphedema (LE) management/ self-care:  Pt to tolerate daily compression wraps, garments and devices in keeping w/ prescribed wear regime within 1 week of issue date to progress and retain clinical and functional gains and to limit LE progression.    Baseline  dependent    Time  12    Period  Weeks    Status  New    Target Date  06/25/18      OT LONG TERM GOAL #5   Title  Lymphedema (LE) management/ self-care:  During Management Phase CDT Pt to sustain current limb volumes within 5%, and all other clinical gains achieved during OT treatment with needed level of caregiver assistance to limit LE progression, infection risk and further functional decline.    Baseline  dependent    Time  6    Period  Months    Status  New            Plan - 03/31/18 1849    Clinical Impression Statement  Completed LLE comparative limb volumetrics. Data to be calculated before next visit due to tme constraints. Numbers indicate significant limb volume increase of at least 20%. Compression wraps applied  from B1 landmark over existing knee high and extending to groin using short stretch wraps over  Rosidal foam in gradient configuration. Cont as per POC.    Occupational performance deficits (Please refer to evaluation for details):  ADL's;IADL's;Rest and Sleep;Work;Leisure;Social Participation;Other impaired body image    Rehab Potential  Good    OT Frequency  2x / week    OT Duration  12 weeks    OT Treatment/Interventions  Self-care/ADL training;Therapeutic exercise;Manual lymph drainage;Compression bandaging;Patient/family education;Other (comment);Therapeutic activities;Manual Therapy;DME and/or AE instruction;Coping strategies training skin care    Plan  replace custom  HOS devices, toe caps, kne highs, and add capri length thigh highs     Consulted and Agree with Plan of Care  Patient       Patient will benefit from skilled therapeutic intervention in order to improve the following deficits and impairments:  Decreased knowledge of use of DME, Decreased skin integrity, Increased edema, Impaired flexibility, Pain, Decreased mobility, Decreased scar mobility, Decreased endurance, Decreased range of motion, Decreased strength, Decreased balance, Decreased knowledge of precautions, Difficulty walking, Obesity  Visit Diagnosis: Lymphedema, not elsewhere classified    Problem List Patient Active Problem List   Diagnosis Date Noted  . Herpes zoster without complication 00/76/2263  . Other pancytopenia ([place]) 11/08/2017  . Upper respiratory tract infection 10/16/2017  . Healthcare maintenance 10/16/2017  . AKI (acute kidney injury) ([place]) 12/19/2016  . Hyponatremia 12/19/2016  . Pressure injury of skin 12/19/2016  . Encephalopathy, hepatic ([place]) 11/20/2015  . Hepatic cirrhosis ([place]) 11/20/2015  . Thrombocytopenia ([place])   . Stroke or transient ischemic attack (TIA) diagnosed during current admission 11/19/2015  . OSA on CPAP 05/02/2015  . Hemorrhagic cystitis 01/29/2015  . Lymphedema 01/29/2015  . Morbid obesity with body mass index of 50.0-59.9 in adult [facility]) 01/25/2015  . Obesity hypoventilation syndrome ([place]) 01/25/2015  . Snoring   . High cholesterol   . TIA (transient ischemic attack) 01/10/2015  . Encephalopathy acute 01/09/2015  . HTN (hypertension) 01/09/2015  . Borderline diabetic 01/09/2015  . GERD (gastroesophageal reflux disease) 01/09/2015  . Acute encephalopathy    Andrey Spearman, MS, OTR/L, [facility] 03/31/18 6:52 PM   [place] MAIN [facility] SERVICES [address] [place], [place], [address] Phone: [phone]   Fax:  [phone]  Name: Ryan Burgess MRN: [id number] Date of Birth:  November 09, 1971

## 2018-03-31 NOTE — Therapy (Signed)
[place] MAIN [facility] SERVICES [address] [place], [place], [address] Phone: [phone]   Fax:  [phone]  Occupational Therapy Evaluation  Patient Details  Name: Ryan Burgess MRN: [id number] Date of Birth: 1971/05/06 Referring Provider: Esaw Grandchild, NP   Encounter Date: 03/26/2018    Past Medical History:  Diagnosis Date  . Cellulitis   . Cirrhosis, nonalcoholic ([place])   . Dyspnea    with walking   . GERD (gastroesophageal reflux disease)   . Headache    "maybe monthly" (12/19/2016)  . Hepatic encephalopathy ([place]) ~ 2016 X 2   "first time they thought it was a TIA; dx'd hepatic encephalopathy the 2nd time it happened"  . High cholesterol   . Hypertension   . Kidney stones 01/2015   UTI  . Migraine    "none in years; used to have them frequently" (12/19/2016)  . Morbid obesity with body mass index of 50.0-59.9 in adult [facility]) 01/25/2015  . OSA on CPAP   . Pre-diabetes   . Snoring   . Venous stasis     Past Surgical History:  Procedure Laterality Date  . ESOPHAGOGASTRODUODENOSCOPY (EGD) WITH PROPOFOL N/A 07/26/2015   Procedure: ESOPHAGOGASTRODUODENOSCOPY (EGD) WITH PROPOFOL;  Surgeon: Arta Silence, MD;  [facility];  Service: Endoscopy;  Laterality: N/A;  . TEE WITHOUT CARDIOVERSION N/A 04/03/2015   Procedure: TRANSESOPHAGEAL ECHOCARDIOGRAM (TEE);  Surgeon: Adrian Prows, MD;  Location: [facility];  Service: Cardiovascular;  Laterality: N/A;  . VASECTOMY    . WISDOM TOOTH EXTRACTION  1990    There were no vitals filed for this visit.  Subjective Assessment - 03/31/18 1802    Subjective   Pt is referred to Occupational Therapy for evaluation and treatment of BLE lymphedema (LE) by Esaw Grandchild, NP. Pt is well known to this OT as he successfully completed Intensive Phase course of Complete Decongestive Therapy (CDT)  7/18 with 16.56% limb volume reduction achieved below the L knee, thigh reduction of 10.12% and  overall LLE limb volume reduction of 13.07%. Pt returns today for assistance to decrease LLE swelling and replace worn out compression garemnts.    Pertinent History  Recent episode of LE cellulitis and sepsis w/ hospitalization; hx TIA, DM, morbid obesity, OSA, depression; non-smoker    Limitations  Difficulty walking, difficulty w/ functional mobility and transfers, decreased ability to perform basic and instrumental ADLs (LB dressing and bathing, fitting and donning shoes and socks, difficulty performing shoping and  all home management tasks requiring standing and walking) Difficulty performing productive and work activities, restricted social participation in the community    Repetition  Increases Symptoms    Patient Stated Goals  decrease leg swelling and keep it from getting worse so I can do more    Currently in Pain?  Yes    Pain Location  Leg    Pain Orientation  Left;Right    Pain Descriptors / Indicators  Tightness;Sore;Heaviness;Tender;Discomfort    Pain Type  Chronic pain    Pain Onset  More than a month ago chronic    Pain Frequency  Intermittent    Aggravating Factors   extended standing, walking, sitting    Pain Relieving Factors  elevation, compression, MLD    Effect of Pain on Daily Activities  See LIMITATIONS section above    Multiple Pain Sites  No        [facility] OT Assessment - 03/31/18 0001      Assessment   Medical Diagnosis  Severe, stage , BLE lymphedema, L>R    Referring Provider  Esaw Grandchild, NP    Hand Dominance  Right    Prior Therapy  OT for BLE CDT betewwn 02/13/2017 and 06/18/2017. Fitted w BLE custom compression garments and HOS devices.      Precautions   Precautions  Other (comment) DM skin precautions, LE precautions      Restrictions   Weight Bearing Restrictions  No      Home  Environment   Family/patient expects to be discharged to:  Private residence    Living Arrangements  Spouse/significant other    Available Help at Discharge  Family     Type of [place] with household mobility with device;Requires assistive device for independence;Needs assistance with ADLs;Needs assistance with homemaking;Needs assistance with gait;Needs assistance with transfers    Vocation  Unemployed    Vocation Requirements  sitting and working at computer, walking, standing      IADL   Shopping  Shops independently for small purchases    Light Housekeeping  [place] small items, rinses stockings, etc.    Meal Prep  Able to complete simple warm meal prep    Community Mobility  Drives own vehicle needs transport wc for distances    Medication Management  Takes responsibility if medication is prepared in advance in seperate dosage    Financial Management  Manages financial matters independently (budgets, writes checks, pays rent, bills goes to bank), collects and keeps track of income      Mobility   Mobility Status Comments  uses cane in the home, drives self      Activity Tolerance   Activity Tolerance Comments  pt reports decreased endurance for walking      Cognition   Overall Cognitive Status  Within Functional Limits for tasks assessed      Observation/Other Assessments   Observations  LLE appears to be ~ 25% greater in overall volume than RLE.    Skin Integrity  Skin severly dry and flaking, reddened, and indurated with dense fibrosis and peau de orange at medial and posterior thigh lobules.  Nail fungis and very deep creases at base of toes, L>R, indicatiive of years of progression. Skin is reddenend and cool to the touch. Pt has hx of lymphorrhea and non-healing leg ulcer, bot no weeping or open wounds observed today.      Posture/Postural Control   Posture/Postural Control  No significant limitations      Sensation   Light Touch  Appears Intact    Proprioception  Appears Intact      Coordination   Gross Motor Movements are Fluid and Coordinated  Yes    Fine Motor Movements  are Fluid and Coordinated  Yes      ROM / Strength   AROM / PROM / Strength  AROM;PROM;Strength      AROM   Overall AROM   Deficits;Unable to assess    Overall AROM Comments  2/2 swelling      PROM   Overall PROM   Deficits      Strength   Overall Strength  Deficits       LYMPHEDEMA/ONCOLOGY QUESTIONNAIRE - 03/31/18 1820      Right Lower Extremity Lymphedema   Other  TBD      Left Lower Extremity Lymphedema   Other  TBD  OT Treatments/Exercises (OP) - 03/31/18 0001      ADLs   ADL Education Given  Yes      Manual Therapy   Manual Therapy  Edema management    Edema Management  existing custom compression garments are worn out and in need replacement. Existing capri garment does not fit and is not worn. Pt would like to replace with 2 piece, overlapping capri length thigh highs over knee highs and roe caps.                 OT Long Term Goals - 03/26/18 1836      OT LONG TERM GOAL #1   Title  Pt independent w/ lymphedema precautions and prevention principals and strategies to limit LE progression and infection risk.    Baseline  dependent    Time  2    Period  Weeks    Status  New    Target Date  -- 10th OT visit      OT LONG TERM GOAL #2   Title  Pt will remove compression wraps daily to bathe, nspect skin and perform diligent skin care with max caregiver assistance  q 24 hours to limit infection risk and optimal compression effectiveness for limb volume reduction during Intensive Phase CDT.    Baseline  Dependent    Time  12    Period  Weeks    Status  New    Target Date  06/25/18      OT LONG TERM GOAL #3   Title  Lymphedema (LE) management/ self-care:  Pt to achieve at least 10% limb volume reductions bilaterally during Intensive CDT to limit LE progression, to reduce pain, and to improve safe ambulation and functional mobility.    Baseline  in LLE    Time  12    Period  Weeks    Status  New    Target Date  06/25/18      OT  LONG TERM GOAL #4   Title  Lymphedema (LE) management/ self-care:  Pt to tolerate daily compression wraps, garments and devices in keeping w/ prescribed wear regime within 1 week of issue date to progress and retain clinical and functional gains and to limit LE progression.    Baseline  dependent    Time  12    Period  Weeks    Status  New    Target Date  06/25/18      OT LONG TERM GOAL #5   Title  Lymphedema (LE) management/ self-care:  During Management Phase CDT Pt to sustain current limb volumes within 5%, and all other clinical gains achieved during OT treatment with needed level of caregiver assistance to limit LE progression, infection risk and further functional decline.    Baseline  dependent    Time  6    Period  Months    Status  New            Plan - 03/31/18 1824    Clinical Impression Statement  Mr Diana Eves presents with severe, stage 2, BLE lymphedema 2/2 morbid obesity and CVI. LE is exacerbated by recurrent cellultis and OSA. Mr Diana Eves sucessfully completed Intensive Phase CDT with this therapist between 02/13/17 and 06/18/17. He presents today with custom compression garments, including knee length stockings and custom toe caps in place. These garments are worn out and need replacement. They no longer provide proper containmnet and effective compression. Consequently swelling ihas increased bilaterally and is much worse above the knees. Pt  has taken excellent care of skin and his wife assists hime with day and night time compression. He is unable to perform simple self MLD as he is unable to reach body parts due to body habitus and limb volumes. Lymphedema continues to present functional limitations with basic and instrumental ADLs, work and productive activities ( 3rd Disability hearing is pending)  leisure activities, social particpation and role performance within the family. Pt will benefit from OT for repeat Intensive Phase CDT to BLE w/ emphasis on the LLE. Worn out and  poorly fitting Custom compression garments and HOS devices should be replaced. Without skilled OT for lymphedema care, limb swelling is expected to worsen, infection risk  will increase and Pt will experience further functional decline.    Occupational performance deficits (Please refer to evaluation for details):  ADL's;IADL's;Rest and Sleep;Work;Leisure;Social Participation;Other impaired body image    Rehab Potential  Good    OT Frequency  2x / week    OT Duration  12 weeks    OT Treatment/Interventions  Self-care/ADL training;Therapeutic exercise;Manual lymph drainage;Compression bandaging;Patient/family education;Other (comment);Therapeutic activities;Manual Therapy;DME and/or AE instruction;Coping strategies training skin care    Plan  replace custom HOS devices, toe caps, kne highs, and add capri length thigh highs     Consulted and Agree with Plan of Care  Patient       Patient will benefit from skilled therapeutic intervention in order to improve the following deficits and impairments:  Decreased knowledge of use of DME, Decreased skin integrity, Increased edema, Impaired flexibility, Pain, Decreased mobility, Decreased scar mobility, Decreased endurance, Decreased range of motion, Decreased strength, Decreased balance, Decreased knowledge of precautions, Difficulty walking, Obesity  Visit Diagnosis: Lymphedema, not elsewhere classified - Plan: Ot plan of care cert/re-cert    Problem List Patient Active Problem List   Diagnosis Date Noted  . Herpes zoster without complication 42/35/3614  . Other pancytopenia ([place]) 11/08/2017  . Upper respiratory tract infection 10/16/2017  . Healthcare maintenance 10/16/2017  . AKI (acute kidney injury) ([place]) 12/19/2016  . Hyponatremia 12/19/2016  . Pressure injury of skin 12/19/2016  . Encephalopathy, hepatic ([place]) 11/20/2015  . Hepatic cirrhosis ([place]) 11/20/2015  . Thrombocytopenia ([place])   . Stroke or transient ischemic attack (TIA)  diagnosed during current admission 11/19/2015  . OSA on CPAP 05/02/2015  . Hemorrhagic cystitis 01/29/2015  . Lymphedema 01/29/2015  . Morbid obesity with body mass index of 50.0-59.9 in adult [facility]) 01/25/2015  . Obesity hypoventilation syndrome ([place]) 01/25/2015  . Snoring   . High cholesterol   . TIA (transient ischemic attack) 01/10/2015  . Encephalopathy acute 01/09/2015  . HTN (hypertension) 01/09/2015  . Borderline diabetic 01/09/2015  . GERD (gastroesophageal reflux disease) 01/09/2015  . Acute encephalopathy     Andrey Spearman, MS, OTR/L, [facility] 03/31/18 6:43 PM  [place] MAIN [facility] SERVICES [address] [place], [place], [address] Phone: [phone]   Fax:  [phone]  Name: PATRICIA PERALES MRN: [id number] Date of Birth: 1971/02/26

## 2018-04-02 ENCOUNTER — Ambulatory Visit: Payer: BLUE CROSS/BLUE SHIELD | Attending: Adult Health | Admitting: Occupational Therapy

## 2018-04-02 DIAGNOSIS — I890 Lymphedema, not elsewhere classified: Secondary | ICD-10-CM

## 2018-04-02 NOTE — Therapy (Signed)
[place] MAIN [facility] SERVICES [address] [place], [place], [address] Phone: [phone]   Fax:  [phone]  Occupational Therapy Treatment  Patient Details  Name: Ryan Burgess MRN: [id number] Date of Birth: [birth year] Referring Provider: Esaw Grandchild, NP   Encounter Date: 04/02/2018  OT End of Session - 04/02/18 1354    Visit Number  3    Number of Visits  36    Date for OT Re-Evaluation  06/25/18    OT Start Time  0920    OT Stop Time  1004    OT Time Calculation (min)  44 min       Past Medical History:  Diagnosis Date  . Cellulitis   . Cirrhosis, nonalcoholic ([place])   . Dyspnea    with walking   . GERD (gastroesophageal reflux disease)   . Headache    "maybe monthly" (12/19/2016)  . Hepatic encephalopathy ([place]) ~ 2016 X 2   "first time they thought it was a TIA; dx'd hepatic encephalopathy the 2nd time it happened"  . High cholesterol   . Hypertension   . Kidney stones 01/2015   UTI  . Migraine    "none in years; used to have them frequently" (12/19/2016)  . Morbid obesity with body mass index of 50.0-59.9 in adult [facility]) 01/25/2015  . OSA on CPAP   . Pre-diabetes   . Snoring   . Venous stasis     Past Surgical History:  Procedure Laterality Date  . ESOPHAGOGASTRODUODENOSCOPY (EGD) WITH PROPOFOL N/A 07/26/2015   Procedure: ESOPHAGOGASTRODUODENOSCOPY (EGD) WITH PROPOFOL;  Surgeon: Arta Silence, MD;  [facility];  Service: Endoscopy;  Laterality: N/A;  . TEE WITHOUT CARDIOVERSION N/A 04/03/2015   Procedure: TRANSESOPHAGEAL ECHOCARDIOGRAM (TEE);  Surgeon: Adrian Prows, MD;  Location: [place];  Service: Cardiovascular;  Laterality: N/A;  . VASECTOMY    . WISDOM TOOTH EXTRACTION  1990    There were no vitals filed for this visit.  Subjective Assessment - 04/02/18 1351    Subjective   Pt presents for OT treatment visit 3 for CDT to BLE, w/ emphasis on LLE. Pt presents with compression wraps applied  yesterday in place. Pt is 20 minutes late for 60 minute session. Pt rteports he had to stop to go to the restroom on his way to the clinic.    Pertinent History  Recent episode of LE cellulitis and sepsis w/ hospitalization; hx TIA, DM, morbid obesity, OSA, depression; non-smoker    Limitations  Difficulty walking, difficulty w/ functional mobility and transfers, decreased ability to perform basic and instrumental ADLs (LB dressing and bathing, fitting and donning shoes and socks, difficulty performing shoping and  all home management tasks requiring standing and walking) Difficulty performing productive and work activities, restricted social participation in the community    Repetition  Increases Symptoms    Patient Stated Goals  decrease leg swelling and keep it from getting worse so I can do more    Currently in Pain?  No/denies    Pain Onset  More than a month ago chronic          LYMPHEDEMA/ONCOLOGY QUESTIONNAIRE - 04/01/18 1035      Right Lower Extremity Lymphedema   Other  TBD      Left Lower Extremity Lymphedema   Other  LLE limb volume below the knee (A-D) measures 8824.85 ml. LLE knee to groin (E-G) limb volume measures 17480.92 ml. LLE full leg volume measures 26305.78 ml.  Other  LLE A-D volume is increased by 21.31% since last measured on 03/20/17. LLE E-G limb volume is increased by 65.32% since last measured  on 03/20/17. LLE Full leg volume  ( A-G) is increased by 47.38% since last measured on 03/20/17.              OT Treatments/Exercises (OP) - 04/02/18 0001      ADLs   ADL Education Given  Yes      Manual Therapy   Manual Therapy  Edema management;Compression Bandaging             OT Education - 04/02/18 1353    Education provided  Yes    Education Details  Reviewed results of comparative limb volumetrics and discussed POC.     Person(s) Educated  Patient    Methods  Explanation;Demonstration    Comprehension  Verbalized understanding;Returned  demonstration;Need further instruction          OT Long Term Goals - 03/26/18 1836      OT LONG TERM GOAL #1   Title  Pt independent w/ lymphedema precautions and prevention principals and strategies to limit LE progression and infection risk.    Baseline  dependent    Time  2    Period  Weeks    Status  New    Target Date  -- 10th OT visit      OT LONG TERM GOAL #2   Title  Pt will remove compression wraps daily to bathe, nspect skin and perform diligent skin care with max caregiver assistance  q 24 hours to limit infection risk and optimal compression effectiveness for limb volume reduction during Intensive Phase CDT.    Baseline  Dependent    Time  12    Period  Weeks    Status  New    Target Date  06/25/18      OT LONG TERM GOAL #3   Title  Lymphedema (LE) management/ self-care:  Pt to achieve at least 10% limb volume reductions bilaterally during Intensive CDT to limit LE progression, to reduce pain, and to improve safe ambulation and functional mobility.    Baseline  in LLE    Time  12    Period  Weeks    Status  New    Target Date  06/25/18      OT LONG TERM GOAL #4   Title  Lymphedema (LE) management/ self-care:  Pt to tolerate daily compression wraps, garments and devices in keeping w/ prescribed wear regime within 1 week of issue date to progress and retain clinical and functional gains and to limit LE progression.    Baseline  dependent    Time  12    Period  Weeks    Status  New    Target Date  06/25/18      OT LONG TERM GOAL #5   Title  Lymphedema (LE) management/ self-care:  During Management Phase CDT Pt to sustain current limb volumes within 5%, and all other clinical gains achieved during OT treatment with needed level of caregiver assistance to limit LE progression, infection risk and further functional decline.    Baseline  dependent    Time  6    Period  Months    Status  New            Plan - 04/02/18 1355    Clinical Impression Statement   Pt tolerated LLE thigh length compression w applied last session without difficulty. His wife  re-applied during interval using correct techniques. Issued replacement Rosidal foam today to replace degraded supply.  No MLD today due to time constraints. LLE is visibly decreased in volume today  and less densely congested by palpation. Cont as per POC.    Occupational performance deficits (Please refer to evaluation for details):  ADL's;IADL's;Rest and Sleep;Work;Leisure;Social Participation;Other impaired body image    Rehab Potential  Good    OT Frequency  2x / week    OT Duration  12 weeks    OT Treatment/Interventions  Self-care/ADL training;Therapeutic exercise;Manual lymph drainage;Compression bandaging;Patient/family education;Other (comment);Therapeutic activities;Manual Therapy;DME and/or AE instruction;Coping strategies training skin care    Plan  replace custom HOS devices, toe caps, kne highs, and add capri length thigh highs     Consulted and Agree with Plan of Care  Patient       Patient will benefit from skilled therapeutic intervention in order to improve the following deficits and impairments:  Decreased knowledge of use of DME, Decreased skin integrity, Increased edema, Impaired flexibility, Pain, Decreased mobility, Decreased scar mobility, Decreased endurance, Decreased range of motion, Decreased strength, Decreased balance, Decreased knowledge of precautions, Difficulty walking, Obesity  Visit Diagnosis: Lymphedema, not elsewhere classified    Problem List Patient Active Problem List   Diagnosis Date Noted  . Herpes zoster without complication 36/46/8032  . Other pancytopenia ([place]) 11/08/2017  . Upper respiratory tract infection 10/16/2017  . Healthcare maintenance 10/16/2017  . AKI (acute kidney injury) ([place]) 12/19/2016  . Hyponatremia 12/19/2016  . Pressure injury of skin 12/19/2016  . Encephalopathy, hepatic ([place]) 11/20/2015  . Hepatic cirrhosis ([place]) 11/20/2015  .  Thrombocytopenia ([place])   . Stroke or transient ischemic attack (TIA) diagnosed during current admission 11/19/2015  . OSA on CPAP 05/02/2015  . Hemorrhagic cystitis 01/29/2015  . Lymphedema 01/29/2015  . Morbid obesity with body mass index of 50.0-59.9 in adult [facility]) 01/25/2015  . Obesity hypoventilation syndrome ([place]) 01/25/2015  . Snoring   . High cholesterol   . TIA (transient ischemic attack) 01/10/2015  . Encephalopathy acute 01/09/2015  . HTN (hypertension) 01/09/2015  . Borderline diabetic 01/09/2015  . GERD (gastroesophageal reflux disease) 01/09/2015  . Acute encephalopathy     Andrey Spearman, MS, OTR/L, [facility] 04/02/18 2:00 PM  [place] MAIN [facility] SERVICES [address] [place], [place], [address] Phone: [phone]   Fax:  [phone]  Name: MATHHEW BUYSSE MRN: [id number] Date of Birth: 27-Sep-1971

## 2018-04-04 ENCOUNTER — Other Ambulatory Visit: Payer: Self-pay | Admitting: Adult Health

## 2018-04-06 ENCOUNTER — Ambulatory Visit
Admission: RE | Admit: 2018-04-06 | Discharge: 2018-04-06 | Disposition: A | Discharge status: Home / Self Care | Payer: BLUE CROSS/BLUE SHIELD | Source: Ambulatory Visit | Attending: Gastroenterology | Admitting: Gastroenterology

## 2018-04-06 DIAGNOSIS — K7460 Unspecified cirrhosis of liver: Secondary | ICD-10-CM

## 2018-04-06 MED ORDER — FUROSEMIDE 40 MG PO TABS: 40.0000 mg | ORAL_TABLET | Freq: Every day | ORAL | 0 refills | Status: AC

## 2018-04-07 ENCOUNTER — Ambulatory Visit: Payer: BLUE CROSS/BLUE SHIELD | Admitting: Occupational Therapy

## 2018-04-09 ENCOUNTER — Ambulatory Visit: Payer: BLUE CROSS/BLUE SHIELD | Admitting: Occupational Therapy

## 2018-04-09 ENCOUNTER — Ambulatory Visit: Payer: BLUE CROSS/BLUE SHIELD | Admitting: Family Medicine

## 2018-04-09 ENCOUNTER — Encounter: Payer: Self-pay | Admitting: Family Medicine

## 2018-04-09 VITALS — BP 142/79 | HR 73 | Ht 76.0 in | Wt >= 6400 oz

## 2018-04-09 DIAGNOSIS — K7200 Acute and subacute hepatic failure without coma: Secondary | ICD-10-CM

## 2018-04-09 DIAGNOSIS — R635 Abnormal weight gain: Secondary | ICD-10-CM | POA: Diagnosis not present

## 2018-04-09 DIAGNOSIS — D61818 Other pancytopenia: Secondary | ICD-10-CM | POA: Diagnosis not present

## 2018-04-09 DIAGNOSIS — Z6841 Body Mass Index (BMI) 40.0 and over, adult: Secondary | ICD-10-CM | POA: Diagnosis not present

## 2018-04-09 DIAGNOSIS — K759 Inflammatory liver disease, unspecified: Secondary | ICD-10-CM

## 2018-04-09 DIAGNOSIS — K7460 Unspecified cirrhosis of liver: Secondary | ICD-10-CM | POA: Diagnosis not present

## 2018-04-09 DIAGNOSIS — D696 Thrombocytopenia, unspecified: Secondary | ICD-10-CM

## 2018-04-09 DIAGNOSIS — E871 Hypo-osmolality and hyponatremia: Secondary | ICD-10-CM

## 2018-04-09 NOTE — Progress Notes (Signed)
Pt here for an acute care OV today   Impression and Recommendations:    1. Hepatic cirrhosis, unspecified hepatic cirrhosis type, unspecified whether ascites present ([place])   2. Acute liver failure without hepatic coma   3. Jaundice due to hepatitis   4. Morbid obesity with BMI of 60.0-69.9, adult ([place])   5. Weight gain, abnormal   6. Thrombocytopenia ([place])   7. Other pancytopenia ([place])   8. Hyponatremia     1. Hepatic cirrhosis/Acute liver failure  -Explained to patient this is unlikely his heart and that I have concerns it is his liver. - Will order labs, see below. - Recommended follow up with his [facility] doc.   Appointment made prior to patient leaving office with Dr. Erlinda Hong PA tomorrow at 11 AM.   - Labs will be faxed to GI office. Pt will go over these results with GI tomorrow. [facility] will fax results to GI early tomorrow am once resulted.  - no hepatotoxic substances/ or OTC meds    Orders Placed This Encounter  Procedures  . CBC with Differential/Platelet  . Comprehensive metabolic panel  . Ammonia     Education and routine counseling performed. Handouts provided  Gross side effects, risk and benefits, and alternatives of medications and treatment plan in general discussed with patient.  Patient is aware that all medications have potential side effects and we are unable to predict every side effect or drug-drug interaction that may occur.   Patient will call with any questions prior to using medication if they have concerns.  Expresses verbal understanding and consents to current therapy and treatment regimen.  No barriers to understanding were identified.  Red flag symptoms and signs discussed in detail.  Patient expressed understanding regarding what to do in case of emergency\urgent symptoms   Please see AVS handed out to patient at the end of our visit for further patient instructions/ counseling done pertaining to today's office visit.   Note: This  document was prepared occasionally using Dragon voice recognition software and may include unintentional dictation errors in addition to a scribe.  This document serves as a record of services personally performed by Mellody Dance, DO. It was created on her behalf by Bea Graff, a trained medical scribe. The creation of this record is based on the scribe's personal observations and the provider's statements to them.   I have reviewed the above medical documentation for accuracy and completeness and I concur.  Mellody Dance 04/18/2018 1:17 PM   ----------------------------------------------------------------------------------------------------------------------------------------------------------------------------------------------------------------------------------------------------------------------------------------------------    Subjective:    CC:  Chief Complaint  Patient presents with  . Medication Reaction    FATIGUE, SORENESS, BLOATING SINCE TALKING VALTREX FOR SHINGLES    HPI: Ryan Burgess is a 47 y.o. male patient of my colleague, Mina Marble, NP, who presents to [facility] today for [place] and issues as discussed below.  Pt c/o increased fatigue more than usual the past couple of weeks.   He also states his body and all of his joints are sore- especially upper extremities with any mvmnt.   He feels they are just extremely heavy and do not want to move.  He also has had Weight gain of 35 pounds in the past 2.5 weeks per pt. - Exertional SOB with short walks across the room more than usual  No chest pain, orthopnea, dysuria, penile discharge, urinary frequency, cough, sore throat, fever, chills. He states he finished his course  of Valtrex about 8 days ago. He states he only took the gabapentin for 1-1.5 days only and then discontinued.   He states he was doing about 20 minutes of activity daily including walking and leg lifts as  directed by physical therapy until he started taking the Valtrex. He states once he began the Valtrex he became increasingly fatigued and did not have the energy to do this. He states his energy levels have not returned to normal since finishing the medication.   He states he had a routine ultrasound of his liver through [facility] earlier this week that showed no acute changes, but is not available for my review.     Wt Readings from Last 3 Encounters:  04/09/18 (!) 538 lb (244 kg)  03/23/18 (!) 503 lb 8 oz (228.4 kg)  02/02/18 (!) 506 lb 6.4 oz (229.7 kg)   BP Readings from Last 3 Encounters:  04/09/18 (!) 142/79  03/23/18 138/78  02/02/18 (!) 159/33   BMI Readings from Last 3 Encounters:  04/09/18 65.49 kg/m  03/23/18 61.29 kg/m  02/02/18 61.64 kg/m     Patient Care Team    Relationship Specialty Notifications Start End  Esaw Grandchild, NP PCP - General Family Medicine  01/16/17   Christin Fudge, MD Consulting Physician Surgery  01/16/17   Arta Silence, MD Consulting Physician Gastroenterology  01/16/17      Patient Active Problem List   Diagnosis Date Noted  . Morbid obesity with BMI of 60.0-69.9, adult ([place]) 04/09/2018  . Herpes zoster without complication 38/75/6433  . Other pancytopenia ([place]) 11/08/2017  . Upper respiratory tract infection 10/16/2017  . Healthcare maintenance 10/16/2017  . AKI (acute kidney injury) ([place]) 12/19/2016  . Hyponatremia 12/19/2016  . Pressure injury of skin 12/19/2016  . Encephalopathy, hepatic ([place]) 11/20/2015  . Hepatic cirrhosis ([place]) 11/20/2015  . Thrombocytopenia ([place])   . Stroke or transient ischemic attack (TIA) diagnosed during current admission 11/19/2015  . OSA on CPAP 05/02/2015  . Hemorrhagic cystitis 01/29/2015  . Lymphedema 01/29/2015  . Obesity hypoventilation syndrome ([place]) 01/25/2015  . Snoring   . High cholesterol   . TIA (transient ischemic attack) 01/10/2015  . Encephalopathy acute 01/09/2015  . HTN  (hypertension) 01/09/2015  . Borderline diabetic 01/09/2015  . GERD (gastroesophageal reflux disease) 01/09/2015  . Acute encephalopathy     Past Medical history, Surgical history, Family history, Social history, Allergies and Medications have been entered into the medical record, reviewed and changed as needed.    Current Meds  Medication Sig  . acetaminophen (TYLENOL) 500 MG tablet Take 1,000 mg by mouth every 6 (six) hours as needed for headache.  Marland Kitchen aspirin EC 81 MG EC tablet Take 1 tablet (81 mg total) by mouth daily.  Marland Kitchen atorvastatin (LIPITOR) 20 MG tablet TAKE 1 TABLET BY MOUTH AT BEDTIME  . blood glucose meter kit and supplies KIT Dispense based on patient and insurance preference. Use up to four times daily as directed. (FOR ICD-9 250.00, 250.01).  . DEXILANT 60 MG capsule TAKE 1 CAPSULE BY MOUTH DAILY.  . furosemide (LASIX) 40 MG tablet Take 1 tablet (40 mg total) by mouth daily.  Marland Kitchen lactulose, encephalopathy, (CHRONULAC) 10 GM/15ML SOLN Take 45 mls by mouth 3 times a day  . lisinopril (PRINIVIL,ZESTRIL) 10 MG tablet Take 10 mg by mouth daily.  Marland Kitchen loratadine (CLARITIN) 10 MG tablet Take 10 mg by mouth daily as needed for allergies.  . Multiple Vitamin (MULTIVITAMIN WITH MINERALS) TABS tablet Take 1  tablet by mouth daily.  . penicillin v potassium (VEETID) 500 MG tablet Take 500 mg by mouth 2 (two) times daily.  . rifaximin (XIFAXAN) 550 MG TABS tablet Take 1 tablet (550 mg total) by mouth 2 (two) times daily.  Marland Kitchen saccharomyces boulardii (FLORASTOR) 250 MG capsule Take 1 capsule (250 mg total) by mouth 2 (two) times daily.  . sertraline (ZOLOFT) 50 MG tablet Take 1 tablet (50 mg total) by mouth daily.  . Vitamin D, Ergocalciferol, (DRISDOL) 50000 units CAPS capsule TAKE 1 CAPSULE BY MOUTH EVERY 7 DAYS.    Allergies:  No Known Allergies   Review of Systems: General:   Increase in weight and fatigue. Denies fever, chills, unexplained weight loss.  Optho/Auditory:   Denies visual  changes, blurred vision/LOV Respiratory:   Denies wheeze. Positive for DOE more than baseline levels.  Cardiovascular:   Denies chest pain, palpitations, new onset peripheral edema  Gastrointestinal:   Denies nausea, vomiting, diarrhea, abd pain.  Genitourinary: Denies dysuria, freq/ urgency, flank pain or discharge from genitals.  Endocrine:     Denies hot or cold intolerance, polyuria, polydipsia. Musculoskeletal:   Upper extremity aches. Denies joint swelling, unexplained arthralgias, gait problems.  Skin:  Denies new onset rash, suspicious lesions Neurological:     Denies dizziness, unexplained weakness, numbness  Psychiatric/Behavioral:   Denies mood changes, suicidal or homicidal ideations, hallucinations    Objective:   Blood pressure (!) 142/79, pulse 73, height '6\' 4"'$  (1.93 m), weight (!) 538 lb (244 kg), SpO2 100 %. Body mass index is 65.49 kg/m. General:  Physical exam finding limited by patient's body habitus. Well Developed, well nourished, appears older than stated age.  Neuro:  Alert and oriented,  extra-ocular muscles intact, mild icterus HEENT:  Normocephalic, atraumatic, neck supple, No JVD Skin:  No gross rash, warm, slight jaundice appearing. Cardiac:  Distant but RRR, S1 S2 Respiratory:  Distant but ECTA B/L and A/P, Not using accessory muscles, speaking in full sentences- unlabored. Vascular:  Ext warm, no cyanosis apprec.; cap RF less 2 sec. Psych:  No HI/SI, judgement and insight good, Euthymic mood. Full Affect.

## 2018-04-09 NOTE — Patient Instructions (Addendum)
-If your symptoms acutely worsen, please proceed to the ER.  -Avoid alcohol, any medicines metabolized by your liver, also avoid NSAIDs such as ibuprofen and Aleve.  Please limit your sodium to less than 1.5-2 g/day.  -We will obtain labs today but you are scheduled to see your gastroenterologist tomorrow at 11 AM.  The results from today's labs will be faxed to them for their review tomorrow.  Please discuss any results from those labs with your gastroenterologist at tomorrow's office visit.   DASH Eating Plan DASH stands for "Dietary Approaches to Stop Hypertension." The DASH eating plan is a healthy eating plan that has been shown to reduce high blood pressure (hypertension). It may also reduce your risk for type 2 diabetes, heart disease, and stroke. The DASH eating plan may also help with weight loss. What are tips for following this plan? General guidelines  Avoid eating more than 2,000 mg (milligrams) of salt (sodium) a day. If you have hypertension, you may need to reduce your sodium intake to 1,500 mg a day.  Limit alcohol intake to no more than 1 drink a day for nonpregnant women and 2 drinks a day for men. One drink equals 12 oz of beer, 5 oz of wine, or 1 oz of hard liquor.  Work with your health care provider to maintain a healthy body weight or to lose weight. Ask what an ideal weight is for you.  Get at least 30 minutes of exercise that causes your heart to beat faster (aerobic exercise) most days of the week. Activities may include walking, swimming, or biking.  Work with your health care provider or diet and nutrition specialist (dietitian) to adjust your eating plan to your individual calorie needs. Reading food labels  Check food labels for the amount of sodium per serving. Choose foods with less than 5 percent of the Daily Value of sodium. Generally, foods with less than 300 mg of sodium per serving fit into this eating plan.  To find whole grains, look for the word  "whole" as the first word in the ingredient list. Shopping  Buy products labeled as "low-sodium" or "no salt added."  Buy fresh foods. Avoid canned foods and premade or frozen meals. Cooking  Avoid adding salt when cooking. Use salt-free seasonings or herbs instead of table salt or sea salt. Check with your health care provider or pharmacist before using salt substitutes.  Do not fry foods. Cook foods using healthy methods such as baking, boiling, grilling, and broiling instead.  Cook with heart-healthy oils, such as olive, canola, soybean, or sunflower oil. Meal planning   Eat a balanced diet that includes: ? 5 or more servings of fruits and vegetables each day. At each meal, try to fill half of your plate with fruits and vegetables. ? Up to 6-8 servings of whole grains each day. ? Less than 6 oz of lean meat, poultry, or fish each day. A 3-oz serving of meat is about the same size as a deck of cards. One egg equals 1 oz. ? 2 servings of low-fat dairy each day. ? A serving of nuts, seeds, or beans 5 times each week. ? Heart-healthy fats. Healthy fats called Omega-3 fatty acids are found in foods such as flaxseeds and coldwater fish, like sardines, salmon, and mackerel.  Limit how much you eat of the following: ? Canned or prepackaged foods. ? Food that is high in trans fat, such as fried foods. ? Food that is high in saturated fat, such  as fatty meat. ? Sweets, desserts, sugary drinks, and other foods with added sugar. ? Full-fat dairy products.  Do not salt foods before eating.  Try to eat at least 2 vegetarian meals each week.  Eat more home-cooked food and less restaurant, buffet, and fast food.  When eating at a restaurant, ask that your food be prepared with less salt or no salt, if possible. What foods are recommended? The items listed may not be a complete list. Talk with your dietitian about what dietary choices are best for you. Grains Whole-grain or whole-wheat  bread. Whole-grain or whole-wheat pasta. Brown rice. [place]. Bulgur. Whole-grain and low-sodium cereals. Pita bread. Low-fat, low-sodium crackers. Whole-wheat flour tortillas. Vegetables Fresh or frozen vegetables (raw, steamed, roasted, or grilled). Low-sodium or reduced-sodium tomato and vegetable juice. Low-sodium or reduced-sodium tomato sauce and tomato paste. Low-sodium or reduced-sodium canned vegetables. Fruits All fresh, dried, or frozen fruit. Canned fruit in natural juice (without added sugar). Meat and other protein foods Skinless chicken or [place]. Ground chicken or [place]. Pork with fat trimmed off. Fish and seafood. Egg whites. Dried beans, peas, or lentils. Unsalted nuts, nut butters, and seeds. Unsalted canned beans. Lean cuts of beef with fat trimmed off. Low-sodium, lean deli meat. Dairy Low-fat (1%) or fat-free (skim) milk. Fat-free, low-fat, or reduced-fat cheeses. Nonfat, low-sodium ricotta or cottage cheese. Low-fat or nonfat yogurt. Low-fat, low-sodium cheese. Fats and oils Soft margarine without trans fats. Vegetable oil. Low-fat, reduced-fat, or light mayonnaise and salad dressings (reduced-sodium). Canola, safflower, olive, soybean, and sunflower oils. Avocado. Seasoning and other foods Herbs. Spices. Seasoning mixes without salt. Unsalted popcorn and pretzels. Fat-free sweets. What foods are not recommended? The items listed may not be a complete list. Talk with your dietitian about what dietary choices are best for you. Grains Baked goods made with fat, such as croissants, muffins, or some breads. Dry pasta or rice meal packs. Vegetables Creamed or fried vegetables. Vegetables in a cheese sauce. Regular canned vegetables (not low-sodium or reduced-sodium). Regular canned tomato sauce and paste (not low-sodium or reduced-sodium). Regular tomato and vegetable juice (not low-sodium or reduced-sodium). Angie Fava. Olives. Fruits Canned fruit in a light or heavy  syrup. Fried fruit. Fruit in cream or butter sauce. Meat and other protein foods Fatty cuts of meat. Ribs. Fried meat. Berniece Salines. Sausage. Bologna and other processed lunch meats. Salami. Fatback. Hotdogs. Bratwurst. Salted nuts and seeds. Canned beans with added salt. Canned or smoked fish. Whole eggs or egg yolks. Chicken or [place] with skin. Dairy Whole or 2% milk, cream, and half-and-half. Whole or full-fat cream cheese. Whole-fat or sweetened yogurt. Full-fat cheese. Nondairy creamers. Whipped toppings. Processed cheese and cheese spreads. Fats and oils Butter. Stick margarine. Lard. Shortening. Ghee. Bacon fat. Tropical oils, such as coconut, palm kernel, or palm oil. Seasoning and other foods Salted popcorn and pretzels. Onion salt, garlic salt, seasoned salt, table salt, and sea salt. Worcestershire sauce. Tartar sauce. Barbecue sauce. Teriyaki sauce. Soy sauce, including reduced-sodium. Steak sauce. Canned and packaged gravies. Fish sauce. Oyster sauce. Cocktail sauce. Horseradish that you find on the shelf. Ketchup. Mustard. Meat flavorings and tenderizers. Bouillon cubes. Hot sauce and Tabasco sauce. Premade or packaged marinades. Premade or packaged taco seasonings. Relishes. Regular salad dressings. Where to find more information:  National Heart, Lung, and [place]: https://wilson-eaton.com/  American Heart Association: www.heart.org Summary  The DASH eating plan is a healthy eating plan that has been shown to reduce high blood pressure (hypertension). It may also reduce your risk  for type 2 diabetes, heart disease, and stroke.  With the DASH eating plan, you should limit salt (sodium) intake to 2,300 mg a day. If you have hypertension, you may need to reduce your sodium intake to 1,500 mg a day.  When on the DASH eating plan, aim to eat more fresh fruits and vegetables, whole grains, lean proteins, low-fat dairy, and heart-healthy fats.  Work with your health care provider or diet and  nutrition specialist (dietitian) to adjust your eating plan to your individual calorie needs. This information is not intended to replace advice given to you by your health care provider. Make sure you discuss any questions you have with your health care provider. Document Released: 11/07/2011 Document Revised: 11/11/2016 Document Reviewed: 11/11/2016 Elsevier Interactive Patient Education  [employer].

## 2018-04-10 LAB — COMPREHENSIVE METABOLIC PANEL
ALBUMIN: 2 g/dL — AB (ref 3.5–5.5)
ALT: 153 IU/L — ABNORMAL HIGH (ref 0–44)
AST: 662 IU/L (ref 0–40)
Albumin/Globulin Ratio: 0.5 — ABNORMAL LOW (ref 1.2–2.2)
Alkaline Phosphatase: 130 IU/L — ABNORMAL HIGH (ref 39–117)
BILIRUBIN TOTAL: 4.6 mg/dL — AB (ref 0.0–1.2)
BUN/Creatinine Ratio: 15 (ref 9–20)
BUN: 12 mg/dL (ref 6–24)
CHLORIDE: 104 mmol/L (ref 96–106)
CO2: 21 mmol/L (ref 20–29)
Calcium: 7.5 mg/dL — ABNORMAL LOW (ref 8.7–10.2)
Creatinine, Ser: 0.8 mg/dL (ref 0.76–1.27)
GFR, EST AFRICAN AMERICAN: 124 mL/min/{1.73_m2} (ref >59)
GFR, EST NON AFRICAN AMERICAN: 107 mL/min/{1.73_m2} (ref >59)
Globulin, Total: 3.8 g/dL (ref 1.5–4.5)
Glucose: 94 mg/dL (ref 65–99)
Potassium: 4.4 mmol/L (ref 3.5–5.2)
Sodium: 134 mmol/L (ref 134–144)
TOTAL PROTEIN: 5.8 g/dL — AB (ref 6.0–8.5)

## 2018-04-10 LAB — CBC WITH DIFFERENTIAL/PLATELET
BASOS: 2 %
Basophils Absolute: 0.1 10*3/uL (ref 0.0–0.2)
EOS (ABSOLUTE): 0 10*3/uL (ref 0.0–0.4)
Eos: 0 %
HEMOGLOBIN: 11.6 g/dL — AB (ref 13.0–17.7)
Hematocrit: 33.6 % — ABNORMAL LOW (ref 37.5–51.0)
Immature Grans (Abs): 0 10*3/uL (ref 0.0–0.1)
Immature Granulocytes: 0 %
LYMPHS ABS: 1.5 10*3/uL (ref 0.7–3.1)
Lymphs: 28 %
MCH: 33.9 pg — ABNORMAL HIGH (ref 26.6–33.0)
MCHC: 34.5 g/dL (ref 31.5–35.7)
MCV: 98 fL — AB (ref 79–97)
Monocytes Absolute: 0.7 10*3/uL (ref 0.1–0.9)
Monocytes: 13 %
NEUTROS ABS: 3.1 10*3/uL (ref 1.4–7.0)
NEUTROS PCT: 57 %
Platelets: 83 10*3/uL — CL (ref 150–379)
RBC: 3.42 x10E6/uL — ABNORMAL LOW (ref 4.14–5.80)
RDW: 17.7 % — ABNORMAL HIGH (ref 12.3–15.4)
WBC: 5.5 10*3/uL (ref 3.4–10.8)

## 2018-04-10 LAB — AMMONIA: Ammonia: 116 ug/dL — ABNORMAL HIGH (ref 27–102)

## 2018-04-13 ENCOUNTER — Other Ambulatory Visit: Payer: Self-pay

## 2018-04-13 ENCOUNTER — Encounter (HOSPITAL_COMMUNITY): Payer: Self-pay | Admitting: *Deleted

## 2018-04-13 ENCOUNTER — Ambulatory Visit: Payer: BLUE CROSS/BLUE SHIELD | Admitting: Occupational Therapy

## 2018-04-13 ENCOUNTER — Inpatient Hospital Stay (HOSPITAL_COMMUNITY)
Admission: EM | Admit: 2018-04-13 | Discharge: 2018-05-02 | DRG: 441 | Disposition: E | Payer: BLUE CROSS/BLUE SHIELD | Attending: Family Medicine | Admitting: Family Medicine

## 2018-04-13 ENCOUNTER — Emergency Department (HOSPITAL_COMMUNITY): Payer: BLUE CROSS/BLUE SHIELD

## 2018-04-13 DIAGNOSIS — E162 Hypoglycemia, unspecified: Secondary | ICD-10-CM | POA: Diagnosis not present

## 2018-04-13 DIAGNOSIS — I10 Essential (primary) hypertension: Secondary | ICD-10-CM | POA: Diagnosis present

## 2018-04-13 DIAGNOSIS — B179 Acute viral hepatitis, unspecified: Secondary | ICD-10-CM | POA: Diagnosis present

## 2018-04-13 DIAGNOSIS — D6959 Other secondary thrombocytopenia: Secondary | ICD-10-CM | POA: Diagnosis present

## 2018-04-13 DIAGNOSIS — Z8673 Personal history of transient ischemic attack (TIA), and cerebral infarction without residual deficits: Secondary | ICD-10-CM

## 2018-04-13 DIAGNOSIS — Z7189 Other specified counseling: Secondary | ICD-10-CM

## 2018-04-13 DIAGNOSIS — I878 Other specified disorders of veins: Secondary | ICD-10-CM | POA: Diagnosis present

## 2018-04-13 DIAGNOSIS — E8770 Fluid overload, unspecified: Secondary | ICD-10-CM | POA: Diagnosis present

## 2018-04-13 DIAGNOSIS — R17 Unspecified jaundice: Secondary | ICD-10-CM

## 2018-04-13 DIAGNOSIS — M6282 Rhabdomyolysis: Secondary | ICD-10-CM | POA: Diagnosis present

## 2018-04-13 DIAGNOSIS — E871 Hypo-osmolality and hyponatremia: Secondary | ICD-10-CM | POA: Diagnosis present

## 2018-04-13 DIAGNOSIS — F419 Anxiety disorder, unspecified: Secondary | ICD-10-CM | POA: Diagnosis not present

## 2018-04-13 DIAGNOSIS — K767 Hepatorenal syndrome: Principal | ICD-10-CM | POA: Diagnosis present

## 2018-04-13 DIAGNOSIS — G4733 Obstructive sleep apnea (adult) (pediatric): Secondary | ICD-10-CM | POA: Diagnosis present

## 2018-04-13 DIAGNOSIS — K219 Gastro-esophageal reflux disease without esophagitis: Secondary | ICD-10-CM | POA: Diagnosis present

## 2018-04-13 DIAGNOSIS — D684 Acquired coagulation factor deficiency: Secondary | ICD-10-CM | POA: Diagnosis present

## 2018-04-13 DIAGNOSIS — D696 Thrombocytopenia, unspecified: Secondary | ICD-10-CM

## 2018-04-13 DIAGNOSIS — Z7982 Long term (current) use of aspirin: Secondary | ICD-10-CM

## 2018-04-13 DIAGNOSIS — K7460 Unspecified cirrhosis of liver: Secondary | ICD-10-CM | POA: Diagnosis present

## 2018-04-13 DIAGNOSIS — I472 Ventricular tachycardia: Secondary | ICD-10-CM | POA: Diagnosis not present

## 2018-04-13 DIAGNOSIS — K7200 Acute and subacute hepatic failure without coma: Secondary | ICD-10-CM | POA: Diagnosis present

## 2018-04-13 DIAGNOSIS — K766 Portal hypertension: Secondary | ICD-10-CM | POA: Diagnosis present

## 2018-04-13 DIAGNOSIS — R7989 Other specified abnormal findings of blood chemistry: Secondary | ICD-10-CM

## 2018-04-13 DIAGNOSIS — N179 Acute kidney failure, unspecified: Secondary | ICD-10-CM

## 2018-04-13 DIAGNOSIS — B029 Zoster without complications: Secondary | ICD-10-CM | POA: Diagnosis present

## 2018-04-13 DIAGNOSIS — E8809 Other disorders of plasma-protein metabolism, not elsewhere classified: Secondary | ICD-10-CM | POA: Diagnosis present

## 2018-04-13 DIAGNOSIS — R140 Abdominal distension (gaseous): Secondary | ICD-10-CM | POA: Diagnosis not present

## 2018-04-13 DIAGNOSIS — E875 Hyperkalemia: Secondary | ICD-10-CM | POA: Diagnosis not present

## 2018-04-13 DIAGNOSIS — D539 Nutritional anemia, unspecified: Secondary | ICD-10-CM | POA: Diagnosis present

## 2018-04-13 DIAGNOSIS — Z515 Encounter for palliative care: Secondary | ICD-10-CM

## 2018-04-13 DIAGNOSIS — I890 Lymphedema, not elsewhere classified: Secondary | ICD-10-CM | POA: Diagnosis present

## 2018-04-13 DIAGNOSIS — Z6841 Body Mass Index (BMI) 40.0 and over, adult: Secondary | ICD-10-CM

## 2018-04-13 DIAGNOSIS — I119 Hypertensive heart disease without heart failure: Secondary | ICD-10-CM | POA: Diagnosis present

## 2018-04-13 DIAGNOSIS — Z66 Do not resuscitate: Secondary | ICD-10-CM | POA: Diagnosis not present

## 2018-04-13 DIAGNOSIS — E785 Hyperlipidemia, unspecified: Secondary | ICD-10-CM | POA: Diagnosis present

## 2018-04-13 DIAGNOSIS — Z79899 Other long term (current) drug therapy: Secondary | ICD-10-CM

## 2018-04-13 DIAGNOSIS — E860 Dehydration: Secondary | ICD-10-CM | POA: Diagnosis not present

## 2018-04-13 DIAGNOSIS — Z452 Encounter for adjustment and management of vascular access device: Secondary | ICD-10-CM

## 2018-04-13 DIAGNOSIS — R579 Shock, unspecified: Secondary | ICD-10-CM | POA: Diagnosis present

## 2018-04-13 DIAGNOSIS — K7210 Chronic hepatic failure without coma: Secondary | ICD-10-CM | POA: Diagnosis present

## 2018-04-13 DIAGNOSIS — Z833 Family history of diabetes mellitus: Secondary | ICD-10-CM

## 2018-04-13 DIAGNOSIS — K7581 Nonalcoholic steatohepatitis (NASH): Secondary | ICD-10-CM | POA: Diagnosis present

## 2018-04-13 DIAGNOSIS — N170 Acute kidney failure with tubular necrosis: Secondary | ICD-10-CM | POA: Diagnosis present

## 2018-04-13 DIAGNOSIS — E7800 Pure hypercholesterolemia, unspecified: Secondary | ICD-10-CM | POA: Diagnosis present

## 2018-04-13 DIAGNOSIS — R161 Splenomegaly, not elsewhere classified: Secondary | ICD-10-CM | POA: Diagnosis present

## 2018-04-13 LAB — PROTIME-INR
INR: 1.99
PROTHROMBIN TIME: 22.5 s — AB (ref 11.4–15.2)

## 2018-04-13 LAB — CBC
HEMATOCRIT: 34.5 % — AB (ref 39.0–52.0)
HEMOGLOBIN: 12.3 g/dL — AB (ref 13.0–17.0)
MCH: 35.3 pg — ABNORMAL HIGH (ref 26.0–34.0)
MCHC: 35.7 g/dL (ref 30.0–36.0)
MCV: 99.1 fL (ref 78.0–100.0)
Platelets: 81 10*3/uL — ABNORMAL LOW (ref 150–400)
RBC: 3.48 MIL/uL — ABNORMAL LOW (ref 4.22–5.81)
RDW: 17.8 % — ABNORMAL HIGH (ref 11.5–15.5)
WBC: 4.8 10*3/uL (ref 4.0–10.5)

## 2018-04-13 LAB — COMPREHENSIVE METABOLIC PANEL
ALBUMIN: 1.8 g/dL — AB (ref 3.5–5.0)
ALT: 308 U/L — ABNORMAL HIGH (ref 17–63)
ANION GAP: 7 (ref 5–15)
AST: 1171 U/L — ABNORMAL HIGH (ref 15–41)
Alkaline Phosphatase: 119 U/L (ref 38–126)
BUN: 16 mg/dL (ref 6–20)
CHLORIDE: 100 mmol/L — AB (ref 101–111)
CO2: 23 mmol/L (ref 22–32)
Calcium: 7.9 mg/dL — ABNORMAL LOW (ref 8.9–10.3)
Creatinine, Ser: 0.74 mg/dL (ref 0.61–1.24)
GFR calc Af Amer: >60 mL/min (ref >60)
GFR calc non Af Amer: >60 mL/min (ref >60)
GLUCOSE: 73 mg/dL (ref 65–99)
POTASSIUM: 4.2 mmol/L (ref 3.5–5.1)
SODIUM: 130 mmol/L — AB (ref 135–145)
Total Bilirubin: 5.9 mg/dL — ABNORMAL HIGH (ref 0.3–1.2)
Total Protein: 5.9 g/dL — ABNORMAL LOW (ref 6.5–8.1)

## 2018-04-13 LAB — URINALYSIS, ROUTINE W REFLEX MICROSCOPIC
BACTERIA UA: NONE SEEN
BILIRUBIN URINE: NEGATIVE
Glucose, UA: NEGATIVE mg/dL
Ketones, ur: 5 mg/dL — AB
LEUKOCYTES UA: NEGATIVE
NITRITE: NEGATIVE
PROTEIN: NEGATIVE mg/dL
Specific Gravity, Urine: 1.019 (ref 1.005–1.030)
pH: 5 (ref 5.0–8.0)

## 2018-04-13 LAB — LIPASE, BLOOD: LIPASE: 41 U/L (ref 11–51)

## 2018-04-13 LAB — AMMONIA: Ammonia: 52 umol/L — ABNORMAL HIGH (ref 9–35)

## 2018-04-13 MED ORDER — SERTRALINE HCL 50 MG PO TABS
50.0000 mg | ORAL_TABLET | Freq: Every day | ORAL | Status: DC
  Administered 2018-04-14 – 2018-04-24 (×11): 50 mg via ORAL
  Filled 2018-04-13 (×12): qty 1

## 2018-04-13 MED ORDER — LACTULOSE 10 GM/15ML PO SOLN
30.0000 g | Freq: Three times a day (TID) | ORAL | Status: DC
  Administered 2018-04-14 – 2018-04-16 (×6): 30 g via ORAL
  Administered 2018-04-17 (×2): 20 g via ORAL
  Administered 2018-04-18 – 2018-04-19 (×5): 30 g via ORAL
  Administered 2018-04-20 (×2): 20 g via ORAL
  Administered 2018-04-20 – 2018-04-24 (×11): 30 g via ORAL
  Filled 2018-04-13 (×18): qty 45
  Filled 2018-04-13: qty 60
  Filled 2018-04-13 (×14): qty 45

## 2018-04-13 MED ORDER — SACCHAROMYCES BOULARDII 250 MG PO CAPS
250.0000 mg | ORAL_CAPSULE | Freq: Two times a day (BID) | ORAL | Status: DC
  Administered 2018-04-14 – 2018-04-24 (×23): 250 mg via ORAL
  Filled 2018-04-13 (×24): qty 1

## 2018-04-13 MED ORDER — RIFAXIMIN 550 MG PO TABS
550.0000 mg | ORAL_TABLET | Freq: Two times a day (BID) | ORAL | Status: DC
  Administered 2018-04-14 – 2018-04-24 (×23): 550 mg via ORAL
  Filled 2018-04-13 (×24): qty 1

## 2018-04-13 MED ORDER — PENICILLIN V POTASSIUM 500 MG PO TABS
500.0000 mg | ORAL_TABLET | Freq: Two times a day (BID) | ORAL | Status: DC
  Administered 2018-04-14 – 2018-04-20 (×12): 500 mg via ORAL
  Filled 2018-04-13 (×6): qty 1
  Filled 2018-04-13 (×2): qty 2
  Filled 2018-04-13: qty 1
  Filled 2018-04-13 (×3): qty 2

## 2018-04-13 MED ORDER — LORATADINE 10 MG PO TABS
10.0000 mg | ORAL_TABLET | Freq: Every day | ORAL | Status: DC | PRN
  Administered 2018-04-16: 10 mg via ORAL
  Filled 2018-04-13 (×2): qty 1

## 2018-04-13 MED ORDER — ONDANSETRON HCL 4 MG PO TABS
4.0000 mg | ORAL_TABLET | Freq: Four times a day (QID) | ORAL | Status: DC | PRN
  Administered 2018-04-18: 4 mg via ORAL
  Filled 2018-04-13: qty 1

## 2018-04-13 MED ORDER — ACETAMINOPHEN 325 MG PO TABS: 650.0000 mg | ORAL_TABLET | Freq: Four times a day (QID) | ORAL | Status: DC | PRN

## 2018-04-13 MED ORDER — FERROUS SULFATE 325 (65 FE) MG PO TABS
325.0000 mg | ORAL_TABLET | Freq: Every day | ORAL | Status: DC
  Administered 2018-04-14 – 2018-04-21 (×8): 325 mg via ORAL
  Filled 2018-04-13 (×8): qty 1

## 2018-04-13 MED ORDER — LISINOPRIL 10 MG PO TABS
10.0000 mg | ORAL_TABLET | Freq: Every day | ORAL | Status: DC
  Administered 2018-04-14 – 2018-04-18 (×5): 10 mg via ORAL
  Filled 2018-04-13 (×5): qty 1

## 2018-04-13 MED ORDER — FUROSEMIDE 40 MG PO TABS
40.0000 mg | ORAL_TABLET | Freq: Two times a day (BID) | ORAL | Status: DC
  Administered 2018-04-14: 40 mg via ORAL
  Filled 2018-04-13: qty 1

## 2018-04-13 MED ORDER — ONDANSETRON HCL 4 MG/2ML IJ SOLN: 4.0000 mg | Freq: Four times a day (QID) | INTRAMUSCULAR | Status: DC | PRN

## 2018-04-13 MED ORDER — ACETAMINOPHEN 650 MG RE SUPP: 650.0000 mg | Freq: Four times a day (QID) | RECTAL | Status: DC | PRN

## 2018-04-13 MED ORDER — ATORVASTATIN CALCIUM 10 MG PO TABS
20.0000 mg | ORAL_TABLET | Freq: Every day | ORAL | Status: DC
  Administered 2018-04-14 – 2018-04-16 (×4): 20 mg via ORAL
  Filled 2018-04-13 (×2): qty 2
  Filled 2018-04-13: qty 1
  Filled 2018-04-13: qty 2

## 2018-04-13 MED ORDER — PANTOPRAZOLE SODIUM 40 MG PO TBEC
80.0000 mg | DELAYED_RELEASE_TABLET | Freq: Every day | ORAL | Status: DC
  Administered 2018-04-14 – 2018-04-24 (×11): 80 mg via ORAL
  Filled 2018-04-13 (×11): qty 2

## 2018-04-13 NOTE — ED Notes (Signed)
BP measurement recorded, RN advised of reading. [employer]

## 2018-04-13 NOTE — ED Triage Notes (Signed)
Per EMS, pt complains of abdominal distension, abdominal pain. Pt denies n/v/d or constipation. Pt denies urinary symptoms. Pt's PCP was concerned pt symptoms are related to cirrhosis.   BP 128/60 HR 80 RR 18 O2 96 CBG 87

## 2018-04-13 NOTE — ED Provider Notes (Signed)
[facility] DEPT Provider Note   CSN: [id number] Arrival date & time: 04/12/2018  1506    History   Chief Complaint Chief Complaint  Patient presents with  . Abdominal Pain    HPI Ryan Burgess is a 47 y.o. male who presents with abdominal swelling. PMH significant for morbid obesity, NASH with cirrhosis and hx of hepatic encephalopathy, chronic lymphedema. He states that three weeks ago he was diagnosed with shingles on the left side of his forehead. He was started on Valtrex. Approximately a week later he started to have abdominal swelling and worsening lower leg swelling. It felt like an "inner tube" around his abdomen. He went to his doctor who was concerned that he appeared jaundiced and had him follow up with GI last Friday. When he went to see [facility] they advised him to decrease his salt and fluid intake and increased his Lasix. He was supposed to follow up with them today to recheck labs however he felt so weak that he felt he was unable to go to the office therefore EMS was called and they transported him here. He denies fever, chills, confusion, abdominal pain, vomiting, diarrhea. He has had nausea and decreased UO. He has never had these symptoms before. Abdominal US on 5/6 did not show ascites.  GI: Dr. Paulita Fujita  HPI  Past Medical History:  Diagnosis Date  . Cellulitis   . Cirrhosis, nonalcoholic ([place])   . Dyspnea    with walking   . GERD (gastroesophageal reflux disease)   . Headache    "maybe monthly" (12/19/2016)  . Hepatic encephalopathy ([place]) ~ 2016 X 2   "first time they thought it was a TIA; dx'd hepatic encephalopathy the 2nd time it happened"  . High cholesterol   . Hypertension   . Kidney stones 01/2015   UTI  . Migraine    "none in years; used to have them frequently" (12/19/2016)  . Morbid obesity with body mass index of 50.0-59.9 in adult [facility]) 01/25/2015  . OSA on CPAP   . Pre-diabetes   . Snoring   . Venous stasis      Patient Active Problem List   Diagnosis Date Noted  . Morbid obesity with BMI of 60.0-69.9, adult ([place]) 04/09/2018  . Herpes zoster without complication 75/64/3329  . Other pancytopenia ([place]) 11/08/2017  . Upper respiratory tract infection 10/16/2017  . Healthcare maintenance 10/16/2017  . AKI (acute kidney injury) ([place]) 12/19/2016  . Hyponatremia 12/19/2016  . Pressure injury of skin 12/19/2016  . Encephalopathy, hepatic ([place]) 11/20/2015  . Hepatic cirrhosis ([place]) 11/20/2015  . Thrombocytopenia ([place])   . Stroke or transient ischemic attack (TIA) diagnosed during current admission 11/19/2015  . OSA on CPAP 05/02/2015  . Hemorrhagic cystitis 01/29/2015  . Lymphedema 01/29/2015  . Obesity hypoventilation syndrome ([place]) 01/25/2015  . Snoring   . High cholesterol   . TIA (transient ischemic attack) 01/10/2015  . Encephalopathy acute 01/09/2015  . HTN (hypertension) 01/09/2015  . Borderline diabetic 01/09/2015  . GERD (gastroesophageal reflux disease) 01/09/2015  . Acute encephalopathy     Past Surgical History:  Procedure Laterality Date  . ESOPHAGOGASTRODUODENOSCOPY (EGD) WITH PROPOFOL N/A 07/26/2015   Procedure: ESOPHAGOGASTRODUODENOSCOPY (EGD) WITH PROPOFOL;  Surgeon: Arta Silence, MD;  Location: [facility];  Service: Endoscopy;  Laterality: N/A;  . TEE WITHOUT CARDIOVERSION N/A 04/03/2015   Procedure: TRANSESOPHAGEAL ECHOCARDIOGRAM (TEE);  Surgeon: Adrian Prows, MD;  Location: [facility];  Service: Cardiovascular;  Laterality: N/A;  . VASECTOMY    .  [place] Medications    Prior to Admission medications   Medication Sig Start Date End Date Taking? Authorizing Provider  acetaminophen (TYLENOL) 500 MG tablet Take 1,000 mg by mouth every 6 (six) hours as needed for headache.   Yes [provider]  aspirin EC 81 MG EC tablet Take 1 tablet (81 mg total) by mouth daily. 01/10/15  Yes Rai, Ripudeep K, MD  atorvastatin (LIPITOR) 20  MG tablet TAKE 1 TABLET BY MOUTH AT BEDTIME 02/26/18  Yes Danford, Katy D, NP  DEXILANT 60 MG capsule TAKE 1 CAPSULE BY MOUTH DAILY. 03/12/18  Yes Danford, Valetta Fuller D, NP  ferrous sulfate 325 (65 FE) MG EC tablet Take 325 mg by mouth daily with breakfast.   Yes [provider]  furosemide (LASIX) 40 MG tablet Take 1 tablet (40 mg total) by mouth daily. Patient taking differently: Take 40 mg by mouth 2 (two) times daily.  04/06/18  Yes Danford, Valetta Fuller D, NP  lactulose, encephalopathy, (CHRONULAC) 10 GM/15ML SOLN Take 10 g by mouth 2 (two) times daily as needed (constipation). Take 45 mls by mouth 3 times a day 04/24/16  Yes [provider]  lisinopril (PRINIVIL,ZESTRIL) 10 MG tablet Take 10 mg by mouth daily.   Yes [provider]  penicillin v potassium (VEETID) 500 MG tablet Take 500 mg by mouth 2 (two) times daily.   Yes [provider]  rifaximin (XIFAXAN) 550 MG TABS tablet Take 1 tablet (550 mg total) by mouth 2 (two) times daily. 06/14/16  Yes Geronimo Boot, MD  saccharomyces boulardii (FLORASTOR) 250 MG capsule Take 1 capsule (250 mg total) by mouth 2 (two) times daily. 01/27/17  Yes Danford, Valetta Fuller D, NP  sertraline (ZOLOFT) 50 MG tablet Take 1 tablet (50 mg total) by mouth daily. 01/13/18  Yes Danford, Berna Spare, NP  blood glucose meter kit and supplies KIT Dispense based on patient and insurance preference. Use up to four times daily as directed. (FOR ICD-9 250.00, 250.01). 01/11/15   Rai, Ripudeep K, MD  ferrous sulfate 325 (65 FE) MG EC tablet Take 1 tablet (325 mg total) by mouth daily with breakfast. 02/12/18 03/14/18  Ardath Sax, MD  loratadine (CLARITIN) 10 MG tablet Take 10 mg by mouth daily as needed for allergies.    [provider]  valACYclovir (VALTREX) 1000 MG tablet Take 1 tablet (1,000 mg total) by mouth 3 (three) times daily. Patient not taking: Reported on 04/09/2018 03/23/18   Mina Marble D, NP  Vitamin D, Ergocalciferol, (DRISDOL) 50000  units CAPS capsule TAKE 1 CAPSULE BY MOUTH EVERY 7 DAYS. 01/12/18   Esaw Grandchild, NP    Family History Family History  Problem Relation Age of Onset  . Leukemia Mother   . Diabetes Father   . Heart disease Maternal Grandmother   . Heart disease Paternal Grandmother   . Dementia Paternal Grandmother   . Diabetes Paternal Grandmother   . Frontotemporal dementia Paternal Grandmother   . COPD Maternal Grandfather   . Heart disease Paternal Grandfather   . Diabetes Paternal Grandfather     Social History Social History   Tobacco Use  . Smoking status: Never Smoker  . Smokeless tobacco: Never Used  Substance Use Topics  . Alcohol use: Yes    Alcohol/week: 0.6 - 1.2 oz    Types: 1 - 2 Standard drinks or equivalent per week    Comment: once a year  .  Drug use: No     Allergies   Patient has no known allergies.   Review of Systems Review of Systems  Constitutional: Negative for fever.  Respiratory: Negative for shortness of breath.   Cardiovascular: Negative for chest pain.  Gastrointestinal: Positive for abdominal distention and nausea. Negative for abdominal pain, diarrhea and vomiting.  Genitourinary: Positive for decreased urine volume. Negative for dysuria.  Neurological: Positive for weakness (generalized).  Psychiatric/Behavioral: Negative for confusion.  All other systems reviewed and are negative.    Physical Exam Updated Vital Signs BP (!) 138/43 (BP Location: Right Arm)   Pulse 89   Temp 97.6 F (36.4 C) (Oral)   Resp 16   SpO2 100%   Physical Exam  Constitutional: He is oriented to person, place, and time. He appears well-developed and well-nourished. No distress.  Calm and cooperative. Morbidly obese  HENT:  Head: Normocephalic and atraumatic.  Eyes: Pupils are equal, round, and reactive to light. Conjunctivae are normal. Right eye exhibits no discharge. Left eye exhibits no discharge. Scleral icterus is present.  Neck: Normal range of motion.   Cardiovascular: Normal rate and regular rhythm.  Pulmonary/Chest: Effort normal and breath sounds normal. No respiratory distress.  Abdominal: Soft. Bowel sounds are normal. He exhibits no distension. There is no tenderness.  Musculoskeletal:  Significant pitting edema of bilateral legs extending to thighs. Mild erythema of the inside of the left thigh  Neurological: He is alert and oriented to person, place, and time.  Skin: Skin is warm and dry.  Psychiatric: He has a normal mood and affect. His behavior is normal.  Nursing note and vitals reviewed.    ED Treatments / Results  Labs (all labs ordered are listed, but only abnormal results are displayed) Labs Reviewed  COMPREHENSIVE METABOLIC PANEL - Abnormal; Notable for the following components:      Result Value   Sodium 130 (*)    Chloride 100 (*)    Calcium 7.9 (*)    Total Protein 5.9 (*)    Albumin 1.8 (*)    AST 1,171 (*)    ALT 308 (*)    Total Bilirubin 5.9 (*)    All other components within normal limits  CBC - Abnormal; Notable for the following components:   RBC 3.48 (*)    Hemoglobin 12.3 (*)    HCT 34.5 (*)    MCH 35.3 (*)    RDW 17.8 (*)    Platelets 81 (*)    All other components within normal limits  URINALYSIS, ROUTINE W REFLEX MICROSCOPIC - Abnormal; Notable for the following components:   Color, Urine AMBER (*)    Hgb urine dipstick SMALL (*)    Ketones, ur 5 (*)    All other components within normal limits  PROTIME-INR - Abnormal; Notable for the following components:   Prothrombin Time 22.5 (*)    All other components within normal limits  AMMONIA - Abnormal; Notable for the following components:   Ammonia 52 (*)    All other components within normal limits  LIPASE, BLOOD  HEPATITIS PANEL, ACUTE    EKG None  Radiology [place] Ascites (abdomen Limited)  Result Date: 04/29/2018 CLINICAL DATA:  Abdominal distension. EXAM: LIMITED ABDOMEN ULTRASOUND FOR ASCITES TECHNIQUE: Limited ultrasound  survey for ascites was performed in all four abdominal quadrants. COMPARISON:  None. FINDINGS: Four quadrants and bilateral flanks were evaluated with ultrasound. No ascites identified. IMPRESSION: No ascites. Electronically Signed   By: Franki Cabot M.D.   On:  04/16/2018 21:31    Procedures Procedures (including critical care time)  Medications Ordered in ED Medications - No data to display   Initial Impression / Assessment and Plan / ED Course  I have reviewed the triage vital signs and the nursing notes.  Pertinent labs & imaging results that were available during my care of the patient were reviewed by me and considered in my medical decision making (see chart for details).  47 year old male presents with jaundice and generalized weakness. Concern for acute liver failure. Vitals are normal. CBC is remarkable for anemia and thrombocytopenia (81). CMP is remarkable for hyponatremia (130), AST (1,171), ALT (308), biliruin (5.9), elevated ammonia (52). INR is 1.99. Abdominal US is negative for ascites. Shared visit with Dr. Lita Mains. Will consult GI for recommendations.  11:16 PM I spoke with Dr. Cristina Gong - he is unsure of etiology but does recommend inpatient treatment since he is worsening, he is morbidly obese and unable to closely follow up.  11:42 PM Spoke with Dr. Alcario Drought who will admit.   Final Clinical Impressions(s) / ED Diagnoses   Final diagnoses:  Jaundice  Hyponatremia  Thrombocytopenia ([place])  Increased ammonia level    ED Discharge Orders    None       Iris Pert 05/01/2018 2342    Julianne Rice, MD 04/16/18 [phone]

## 2018-04-14 DIAGNOSIS — K7581 Nonalcoholic steatohepatitis (NASH): Secondary | ICD-10-CM | POA: Diagnosis not present

## 2018-04-14 DIAGNOSIS — I10 Essential (primary) hypertension: Secondary | ICD-10-CM

## 2018-04-14 DIAGNOSIS — D696 Thrombocytopenia, unspecified: Secondary | ICD-10-CM

## 2018-04-14 DIAGNOSIS — K7469 Other cirrhosis of liver: Secondary | ICD-10-CM | POA: Diagnosis not present

## 2018-04-14 LAB — COMPREHENSIVE METABOLIC PANEL
ALT: 303 U/L — ABNORMAL HIGH (ref 17–63)
AST: 1108 U/L — ABNORMAL HIGH (ref 15–41)
Albumin: 1.6 g/dL — ABNORMAL LOW (ref 3.5–5.0)
Alkaline Phosphatase: 108 U/L (ref 38–126)
Anion gap: 6 (ref 5–15)
BUN: 23 mg/dL — ABNORMAL HIGH (ref 6–20)
CO2: 23 mmol/L (ref 22–32)
Calcium: 7.6 mg/dL — ABNORMAL LOW (ref 8.9–10.3)
Chloride: 100 mmol/L — ABNORMAL LOW (ref 101–111)
Creatinine, Ser: 0.95 mg/dL (ref 0.61–1.24)
GFR calc Af Amer: >60 mL/min (ref >60)
GFR calc non Af Amer: >60 mL/min (ref >60)
Glucose, Bld: 95 mg/dL (ref 65–99)
Potassium: 4.4 mmol/L (ref 3.5–5.1)
Sodium: 129 mmol/L — ABNORMAL LOW (ref 135–145)
Total Bilirubin: 5.5 mg/dL — ABNORMAL HIGH (ref 0.3–1.2)
Total Protein: 5.2 g/dL — ABNORMAL LOW (ref 6.5–8.1)

## 2018-04-14 LAB — HEPATITIS PANEL, ACUTE
HCV Ab: <0.1 s/co ratio (ref 0.0–0.9)
Hep A IgM: NEGATIVE
Hep B C IgM: NEGATIVE
Hepatitis B Surface Ag: NEGATIVE

## 2018-04-14 LAB — CBC
HEMATOCRIT: 29 % — AB (ref 39.0–52.0)
HEMOGLOBIN: 10.3 g/dL — AB (ref 13.0–17.0)
MCH: 35.3 pg — AB (ref 26.0–34.0)
MCHC: 35.5 g/dL (ref 30.0–36.0)
MCV: 99.3 fL (ref 78.0–100.0)
Platelets: 81 10*3/uL — ABNORMAL LOW (ref 150–400)
RBC: 2.92 MIL/uL — ABNORMAL LOW (ref 4.22–5.81)
RDW: 18 % — ABNORMAL HIGH (ref 11.5–15.5)
WBC: 5.8 10*3/uL (ref 4.0–10.5)

## 2018-04-14 LAB — MRSA PCR SCREENING: MRSA BY PCR: NEGATIVE

## 2018-04-14 MED ORDER — NYSTATIN 100000 UNIT/GM EX POWD
Freq: Three times a day (TID) | CUTANEOUS | Status: DC
  Administered 2018-04-14 – 2018-04-21 (×17): via TOPICAL
  Administered 2018-04-21: 1 via TOPICAL
  Administered 2018-04-22 – 2018-04-24 (×7): via TOPICAL
  Filled 2018-04-14 (×3): qty 15

## 2018-04-14 MED ORDER — CHLORHEXIDINE GLUCONATE 0.12 % MT SOLN
15.0000 mL | Freq: Two times a day (BID) | OROMUCOSAL | Status: DC
  Administered 2018-04-14 – 2018-04-25 (×17): 15 mL via OROMUCOSAL
  Filled 2018-04-14 (×15): qty 15

## 2018-04-14 MED ORDER — FUROSEMIDE 40 MG PO TABS
80.0000 mg | ORAL_TABLET | Freq: Two times a day (BID) | ORAL | Status: DC
  Administered 2018-04-14 – 2018-04-15 (×3): 80 mg via ORAL
  Filled 2018-04-14 (×4): qty 2

## 2018-04-14 MED ORDER — ORAL CARE MOUTH RINSE
15.0000 mL | Freq: Two times a day (BID) | OROMUCOSAL | Status: DC
  Administered 2018-04-18 – 2018-04-23 (×6): 15 mL via OROMUCOSAL

## 2018-04-14 NOTE — Consult Note (Signed)
Referring Provider:  Dr. Hillary Bow Primary Care Physician:  Esaw Grandchild, NP  ([facility]) Primary Gastroenterologist:  Dr. Paulita Fujita   Reason for Consultation:  NASH cirrhosis with rise in LFT's  HPI: Ryan Burgess is a 47 y.o. male admitted through the emergency room last night.  He has Karlene Lineman cirrhosis, first diagnosed by ultrasound in February 2016 which showed a nodular liver and splenomegaly.  Manifestations of this condition included hepatic encephalopathy fairly well controlled by lactulose and rifaximin, and chronic thrombocytopenia.    He also has chronic lower extremity edema but it is difficult to sort out the etiology of that since the patient also has morbid obesity (recent weight 538 pounds, according to the patient).  With that background, about 3 weeks ago the patient had periorbital shingles, and was treated with Valtrex.  Around that time, he began to notice undue fatigue (barely able to take a few steps without being out of breath), marked weakness of his extremities, fluid retention with recent weight gain of 30 pounds (which might be contributing to the difficulty moving his extremities) and when checked, his liver chemistries had risen severely.    For example, in February of this year, the transaminases were in the 30-60 range, but when checked on May 9, AST was 662 and ALT was 153, with his bilirubin rising from 2.7-4.6 during the same interval.  When checked on admission, bilirubin had climbed further to 5.9, AST was 1171, and ALT was 308, alk phos normal.  Of some concern, his INR had gone from 1.7-2.0.  He felt too weak to go into the office for further evaluation and follow-up, and therefore came to the emergency room last night.  Note that the patient has never had GI bleeding, and his one and only endoscopy, performed 2016, was negative for any varices.  A transesophageal echocardiogram in May 2016 had shown normal LV function.  His renal function has  been consistently normal.    His Lasix dose was increased from 40 mg daily to 40 mg twice daily about 5 days ago, without evidence of further progression in his fluid retention but without a major diuresis, either.  Past Medical History:  Diagnosis Date  . Cellulitis   . Cirrhosis, nonalcoholic ([place])   . Dyspnea    with walking   . GERD (gastroesophageal reflux disease)   . Headache    "maybe monthly" (12/19/2016)  . Hepatic encephalopathy ([place]) ~ 2016 X 2   "first time they thought it was a TIA; dx'd hepatic encephalopathy the 2nd time it happened"  . High cholesterol   . Hypertension   . Kidney stones 01/2015   UTI  . Migraine    "none in years; used to have them frequently" (12/19/2016)  . Morbid obesity with body mass index of 50.0-59.9 in adult [facility] [place]) 01/25/2015  . OSA on CPAP   . Pre-diabetes   . Snoring   . Venous stasis     Past Surgical History:  Procedure Laterality Date  . ESOPHAGOGASTRODUODENOSCOPY (EGD) WITH PROPOFOL N/A 07/26/2015   Procedure: ESOPHAGOGASTRODUODENOSCOPY (EGD) WITH PROPOFOL;  Surgeon: Arta Silence, MD;  Location: [facility];  Service: Endoscopy;  Laterality: N/A;  . TEE WITHOUT CARDIOVERSION N/A 04/03/2015   Procedure: TRANSESOPHAGEAL ECHOCARDIOGRAM (TEE);  Surgeon: Adrian Prows, MD;  Location: [place];  Service: Cardiovascular;  Laterality: N/A;  . VASECTOMY    . [place]    Prior to Admission medications   Medication Sig Start  Date End Date Taking? Authorizing Provider  aspirin EC 81 MG EC tablet Take 1 tablet (81 mg total) by mouth daily. 01/10/15  Yes Rai, Ripudeep K, MD  atorvastatin (LIPITOR) 20 MG tablet TAKE 1 TABLET BY MOUTH AT BEDTIME 02/26/18  Yes Danford, Katy D, NP  DEXILANT 60 MG capsule TAKE 1 CAPSULE BY MOUTH DAILY. 03/12/18  Yes Danford, Valetta Fuller D, NP  ferrous sulfate 325 (65 FE) MG EC tablet Take 325 mg by mouth daily with breakfast.   Yes [provider]  furosemide (LASIX) 40 MG tablet Take 1 tablet (40  mg total) by mouth daily. Patient taking differently: Take 40 mg by mouth 2 (two) times daily.  04/06/18  Yes Danford, Valetta Fuller D, NP  lactulose, encephalopathy, (CHRONULAC) 10 GM/15ML SOLN Take 10 g by mouth 2 (two) times daily as needed (constipation). Take 45 mls by mouth 3 times a day 04/24/16  Yes [provider]  lisinopril (PRINIVIL,ZESTRIL) 10 MG tablet Take 10 mg by mouth daily.   Yes [provider]  penicillin v potassium (VEETID) 500 MG tablet Take 500 mg by mouth 2 (two) times daily.   Yes [provider]  rifaximin (XIFAXAN) 550 MG TABS tablet Take 1 tablet (550 mg total) by mouth 2 (two) times daily. 06/14/16  Yes Geronimo Boot, MD  saccharomyces boulardii (FLORASTOR) 250 MG capsule Take 1 capsule (250 mg total) by mouth 2 (two) times daily. 01/27/17  Yes Danford, Valetta Fuller D, NP  sertraline (ZOLOFT) 50 MG tablet Take 1 tablet (50 mg total) by mouth daily. 01/13/18  Yes Danford, Berna Spare, NP  blood glucose meter kit and supplies KIT Dispense based on patient and insurance preference. Use up to four times daily as directed. (FOR ICD-9 250.00, 250.01). 01/11/15   Rai, Ripudeep K, MD  ferrous sulfate 325 (65 FE) MG EC tablet Take 1 tablet (325 mg total) by mouth daily with breakfast. 02/12/18 03/14/18  Ardath Sax, MD  loratadine (CLARITIN) 10 MG tablet Take 10 mg by mouth daily as needed for allergies.    [provider]  Vitamin D, Ergocalciferol, (DRISDOL) 50000 units CAPS capsule TAKE 1 CAPSULE BY MOUTH EVERY 7 DAYS. 01/12/18   Esaw Grandchild, NP    Current Facility-Administered Medications  Medication Dose Route Frequency Provider Last Rate Last Dose  . atorvastatin (LIPITOR) tablet 20 mg  20 mg Oral QHS Jennette Kettle M, DO   20 mg at 04/14/18 0056  . chlorhexidine (PERIDEX) 0.12 % solution 15 mL  15 mL Mouth Rinse BID Velvet Bathe, MD      . ferrous sulfate tablet 325 mg  325 mg Oral Q breakfast Jennette Kettle M, DO   325 mg at 04/14/18 3154  .  furosemide (LASIX) tablet 80 mg  80 mg Oral BID Tarissa Kerin, Herbie Baltimore, MD      . lactulose (CHRONULAC) 10 GM/15ML solution 30 g  30 g Oral TID Etta Quill, DO   30 g at 04/14/18 0936  . lisinopril (PRINIVIL,ZESTRIL) tablet 10 mg  10 mg Oral Daily Jennette Kettle M, DO   10 mg at 04/14/18 0935  . loratadine (CLARITIN) tablet 10 mg  10 mg Oral Daily PRN Etta Quill, DO      . MEDLINE mouth rinse  15 mL Mouth Rinse q12n4p Velvet Bathe, MD      . nystatin (MYCOSTATIN/NYSTOP) topical powder   Topical TID Velvet Bathe, MD      . ondansetron [facility]) tablet 4 mg  4 mg  Oral Q6H PRN Etta Quill, DO       Or  . ondansetron [facility]) injection 4 mg  4 mg Intravenous Q6H PRN Etta Quill, DO      . pantoprazole (PROTONIX) EC tablet 80 mg  80 mg Oral Daily Jennette Kettle M, DO   80 mg at 04/14/18 0933  . penicillin v potassium (VEETID) tablet 500 mg  500 mg Oral BID Jennette Kettle M, DO   500 mg at 04/14/18 0934  . rifaximin (XIFAXAN) tablet 550 mg  550 mg Oral BID Etta Quill, DO   550 mg at 04/14/18 9794  . saccharomyces boulardii (FLORASTOR) capsule 250 mg  250 mg Oral BID Jennette Kettle M, DO   250 mg at 04/14/18 0935  . sertraline (ZOLOFT) tablet 50 mg  50 mg Oral Daily Jennette Kettle M, DO   50 mg at 04/14/18 1056    Allergies as of 04/29/2018  . (No Known Allergies)    Family History  Problem Relation Age of Onset  . Leukemia Mother   . Diabetes Father   . Heart disease Maternal Grandmother   . Heart disease Paternal Grandmother   . Dementia Paternal Grandmother   . Diabetes Paternal Grandmother   . Frontotemporal dementia Paternal Grandmother   . COPD Maternal Grandfather   . Heart disease Paternal Grandfather   . Diabetes Paternal Grandfather     Social History   Socioeconomic History  . Marital status: Married    Spouse name: Hoyle Sauer  . Number of children: 2  . Years of education: masters  . Highest education level: Not on file  Occupational History  . Not on  file  Social Needs  . Financial resource strain: Not on file  . Food insecurity:    Worry: Not on file    Inability: Not on file  . Transportation needs:    Medical: Not on file    Non-medical: Not on file  Tobacco Use  . Smoking status: Never Smoker  . Smokeless tobacco: Never Used  Substance and Sexual Activity  . Alcohol use: Yes    Alcohol/week: 0.6 - 1.2 oz    Types: 1 - 2 Standard drinks or equivalent per week    Comment: once a year  . Drug use: No  . Sexual activity: Yes    Birth control/protection: None  Lifestyle  . Physical activity:    Days per week: Not on file    Minutes per session: Not on file  . Stress: Not on file  Relationships  . Social connections:    Talks on phone: Not on file    Gets together: Not on file    Attends religious service: Not on file    Active member of club or organization: Not on file    Attends meetings of clubs or organizations: Not on file    Relationship status: Not on file  . Intimate partner violence:    Fear of current or ex partner: Not on file    Emotionally abused: Not on file    Physically abused: Not on file    Forced sexual activity: Not on file  Other Topics Concern  . Not on file  Social History Narrative   Patient lives at home with his wife Hoyle Sauer) and two children.    Patient works full time at [employer] in [place].   Right handed.   Caffeine 1-2 sodas and coffee daily.  Review of Systems: The patient does not have orthopnea or resting dyspnea.  He has a tendency toward heartburn but is maintained on medication for that.  Unfortunately, his diet has been rather high in sodium content.   Physical Exam: Vital signs in last 24 hours: Temp:  [99.3 F (37.4 C)] 99.3 F (37.4 C) (05/14 1457) Pulse Rate:  [69-89] 79 (05/14 1457) Resp:  [15-26] 19 (05/14 1457) BP: (103-155)/(30-48) 134/43 (05/14 1457) SpO2:  [88 %-100 %] 97 % (05/14 1457) Weight:  [244 kg (538  lb)] 244 kg (538 lb) (05/13 2047) Last BM Date: 04/14/18  This is a pleasant, articulate Caucasian male, morbidly obese, lying in bed in no distress.  He is completely awake, alert, and coherent although formal mental status testing was not performed.  He has mild icterus more so of the skin of the sclerae.  There is no significant conjunctival pallor.  No cervical adenopathy.  Chest is clear, no wheezes.  Heart, no gallop appreciated.  The abdomen is massively obese and no overt mass-effect.  Not able to assess for cardiomegaly.  Lower extremities significant edema in the lower portion of the leg are wrapped.  No focal neurologic deficits.  Intake/Output from previous day: No intake/output data recorded. Intake/Output this shift: No intake/output data recorded.  Lab Results: Recent Labs    04/23/2018 1715 04/14/18 0941  WBC 4.8 5.8  HGB 12.3* 10.3*  HCT 34.5* 29.0*  PLT 81* 81*   BMET Recent Labs    04/05/2018 1715 04/14/18 0941  NA 130* 129*  K 4.2 4.4  CL 100* 100*  CO2 23 23  GLUCOSE 73 95  BUN 16 23*  CREATININE 0.74 0.95  CALCIUM 7.9* 7.6*   LFT Recent Labs    04/14/18 0941  PROT 5.2*  ALBUMIN 1.6*  AST 1,108*  ALT 303*  ALKPHOS 108  BILITOT 5.5*   PT/INR Recent Labs    04/26/2018 1900  LABPROT 22.5*  INR 1.99    Studies/Results: [place] Ascites (abdomen Limited)  Result Date: 04/29/2018 CLINICAL DATA:  Abdominal distension. EXAM: LIMITED ABDOMEN ULTRASOUND FOR ASCITES TECHNIQUE: Limited ultrasound survey for ascites was performed in all four abdominal quadrants. COMPARISON:  None. FINDINGS: Four quadrants and bilateral flanks were evaluated with ultrasound. No ascites identified. IMPRESSION: No ascites. Electronically Signed   By: Franki Cabot M.D.   On: 04/16/2018 21:31    Impression: 1.  Acute rise in liver chemistries, superimposed on baseline chronic liver disease with minimal elevation of liver chemistries.  I wonder if this could be a consequence of his  recent zoster infection, although I have not seen this occurred previously.  Other possibilities including toxin exposure (I do not think the patient has had significant acetaminophen exposure, plus the persistence of elevation would be atypical) infectious hepatitis (hepatitis serologies for A, B, and C were all negative on admission), or "shock liver" for which there is no appropriate history.  2.  Fluid retention.  Probably multifactorial, but suspect acute decrement in hepatic function as a contributing cause.  3.  Weakness.  Multifactorial, probably post viral, also due to retention and acute hepatic inflammation.  Plan: 1.  Increase diuretic therapy.  The patient states that Spironolactone gives him severe heartburn so I will increase his furosemide to 80 mg twice daily, while monitoring weight, edema, renal function, and electrolytes. 2.  Dietitian consult for instruction and low-sodium diet 3.  Monitor liver chemistries.  There was a slight decline in liver chemistries overnight.  Perhaps  they have "crested."  Otherwise, may need to consider possible transjugular liver biopsy, which would be a formidable undertaking given his morbid obesity. 4.  Eventually, the patient should have an updated endoscopy to check for varices; ideally, would be performed concurrently to get an updated assessment of his cardiac contractility. 5.  We will follow the patient with you.   LOS: 0 days   Josean Lycan V  04/14/2018, 4:00 PM   Pager [phone] If no answer or after 5 PM call [phone]

## 2018-04-14 NOTE — ED Notes (Signed)
ED TO INPATIENT HANDOFF REPORT  Name/Age/Gender Ryan Burgess 47 y.o. male  Code Status    Code Status Orders  (From admission, onward)        Start     Ordered   04/29/2018 2343  Full code  Continuous     04/07/2018 2346    Code Status History    Date Active Date Inactive Code Status Order ID Comments User Context   01/16/2017 1752 01/19/2017 1913 Full Code [id number]  Lavina Hamman, MD Inpatient   12/19/2016 1820 12/23/2016 1924 Full Code [id number]  Mendel Corning, MD Inpatient   11/20/2015 0020 11/20/2015 2039 Full Code [id number]  Norval Morton, MD Inpatient   01/29/2015 0450 01/30/2015 2031 Full Code [id number]  Theressa Millard, MD Inpatient   01/09/2015 2117 01/11/2015 1621 Full Code [id number]  Kelvin Cellar, MD Inpatient      Home/SNF/Other Home  Chief Complaint Abdominal pain  Level of Care/Admitting Diagnosis ED Disposition    ED Disposition Condition [facility] Area: [facility] [[id number]]  Level of Care: Med-Surg [16]  Diagnosis: Cirrhosis [facility]) [[id number]]  Admitting Physician: Etta Quill [phone]  Attending Physician: Etta Quill [[id number]]  PT Class (Do Not Modify): Observation [104]  PT Acc Code (Do Not Modify): Observation [10022]       Medical History Past Medical History:  Diagnosis Date  . Cellulitis   . Cirrhosis, nonalcoholic ([place])   . Dyspnea    with walking   . GERD (gastroesophageal reflux disease)   . Headache    "maybe monthly" (12/19/2016)  . Hepatic encephalopathy ([place]) ~ 2016 X 2   "first time they thought it was a TIA; dx'd hepatic encephalopathy the 2nd time it happened"  . High cholesterol   . Hypertension   . Kidney stones 01/2015   UTI  . Migraine    "none in years; used to have them frequently" (12/19/2016)  . Morbid obesity with body mass index of 50.0-59.9 in adult [facility]) 01/25/2015  . OSA on CPAP   . Pre-diabetes   . Snoring   . Venous stasis     Allergies No Known  Allergies  IV Location/Drains/Wounds Patient Lines/Drains/Airways Status   Active Line/Drains/Airways    Name:   Placement date:   Placement time:   Site:   Days:   Peripheral IV 04/07/2018 Right Forearm   04/15/2018    1715    Forearm   1   Peripheral IV 04/14/18 Left;Anterior Forearm   04/14/18    1224    Forearm   less than 1   Pressure Injury 12/19/16 Stage I -  Intact skin with non-blanchable redness of a localized area usually over a bony prominence.   12/19/16    1800     481          Labs/Imaging Results for orders placed or performed during the hospital encounter of 05/01/2018 (from the past 48 hour(s))  Lipase, blood     Status: None   Collection Time: 04/17/2018  5:15 PM  Result Value Ref Range   Lipase 41 11 - 51 U/L    Comment: Performed at [facility], [place] [address], [place], [place] [address]  Comprehensive metabolic panel     Status: Abnormal   Collection Time: 04/11/2018  5:15 PM  Result Value Ref Range   Sodium 130 (L) 135 - 145 mmol/L   Potassium 4.2 3.5 - 5.1 mmol/L   Chloride  100 (L) 101 - 111 mmol/L   CO2 23 22 - 32 mmol/L   Glucose, Bld 73 65 - 99 mg/dL   BUN 16 6 - 20 mg/dL   Creatinine, Ser 0.74 0.61 - 1.24 mg/dL   Calcium 7.9 (L) 8.9 - 10.3 mg/dL   Total Protein 5.9 (L) 6.5 - 8.1 g/dL   Albumin 1.8 (L) 3.5 - 5.0 g/dL   AST 1,171 (H) 15 - 41 U/L   ALT 308 (H) 17 - 63 U/L   Alkaline Phosphatase 119 38 - 126 U/L   Total Bilirubin 5.9 (H) 0.3 - 1.2 mg/dL   GFR calc non Af Amer >60 >60 mL/min   GFR calc Af Amer >60 >60 mL/min    Comment: (NOTE) The eGFR has been calculated using the CKD EPI equation. This calculation has not been validated in all clinical situations. eGFR's persistently <60 mL/min signify possible Chronic Kidney Disease.    Anion gap 7 5 - 15    Comment: Performed at [facility], [place] [address], [place], [place] [address]  CBC     Status: Abnormal   Collection Time: 04/16/2018  5:15 PM  Result Value  Ref Range   WBC 4.8 4.0 - 10.5 K/uL   RBC 3.48 (L) 4.22 - 5.81 MIL/uL   Hemoglobin 12.3 (L) 13.0 - 17.0 g/dL   HCT 34.5 (L) 39.0 - 52.0 %   MCV 99.1 78.0 - 100.0 fL   MCH 35.3 (H) 26.0 - 34.0 pg   MCHC 35.7 30.0 - 36.0 g/dL   RDW 17.8 (H) 11.5 - 15.5 %   Platelets 81 (L) 150 - 400 K/uL    Comment: REPEATED TO VERIFY SPECIMEN CHECKED FOR CLOTS PLATELET COUNT CONFIRMED BY SMEAR Performed at [place] [address], [place], [place] [address]   Protime-INR     Status: Abnormal   Collection Time: 04/29/2018  7:00 PM  Result Value Ref Range   Prothrombin Time 22.5 (H) 11.4 - 15.2 seconds   INR 1.99     Comment: Performed at [facility], [place] [address], [place], [place] [address]  Ammonia     Status: Abnormal   Collection Time: 04/12/2018  7:00 PM  Result Value Ref Range   Ammonia 52 (H) 9 - 35 umol/L    Comment: Performed at [facility], [place] [address], [place], [place] [address]  Hepatitis panel, acute     Status: None   Collection Time: 04/20/2018  7:00 PM  Result Value Ref Range   Hepatitis B Surface Ag Negative Negative   HCV Ab <0.1 0.0 - 0.9 s/co ratio    Comment: (NOTE)                                  Negative:     < 0.8                             Indeterminate: 0.8 - 0.9                                  Positive:     > 0.9 The CDC recommends that a positive HCV antibody result be followed up with a HCV Nucleic Acid Amplification test (272536). Performed At: [facility] [address]  [place], [place] [id number] Rush Farmer MD WH:[id number]    Hep A IgM Negative Negative   Hep B C IgM Negative Negative    Comment: Performed at [facility], [place] [address] [place], [place] [address]  Urinalysis, Routine w reflex microscopic     Status: Abnormal   Collection Time: 04/08/2018  8:16 PM  Result Value Ref Range   Color, Urine AMBER (A) YELLOW    Comment: BIOCHEMICALS MAY BE AFFECTED  BY COLOR   APPearance CLEAR CLEAR   Specific Gravity, Urine 1.019 1.005 - 1.030   pH 5.0 5.0 - 8.0   Glucose, UA NEGATIVE NEGATIVE mg/dL   Hgb urine dipstick SMALL (A) NEGATIVE   Bilirubin Urine NEGATIVE NEGATIVE   Ketones, ur 5 (A) NEGATIVE mg/dL   Protein, ur NEGATIVE NEGATIVE mg/dL   Nitrite NEGATIVE NEGATIVE   Leukocytes, UA NEGATIVE NEGATIVE   RBC / HPF 0-5 0 - 5 RBC/hpf   WBC, UA 0-5 0 - 5 WBC/hpf   Bacteria, UA NONE SEEN NONE SEEN   Squamous Epithelial / LPF 0-5 0 - 5   Mucus PRESENT     Comment: Performed at [facility], [place] [address] [place], [place] [address]  CBC     Status: Abnormal   Collection Time: 04/14/18  9:41 AM  Result Value Ref Range   WBC 5.8 4.0 - 10.5 K/uL   RBC 2.92 (L) 4.22 - 5.81 MIL/uL   Hemoglobin 10.3 (L) 13.0 - 17.0 g/dL   HCT 29.0 (L) 39.0 - 52.0 %   MCV 99.3 78.0 - 100.0 fL   MCH 35.3 (H) 26.0 - 34.0 pg   MCHC 35.5 30.0 - 36.0 g/dL   RDW 18.0 (H) 11.5 - 15.5 %   Platelets 81 (L) 150 - 400 K/uL    Comment: CONSISTENT WITH PREVIOUS RESULT Performed at [place] [address], [place],  [address]   Comprehensive metabolic panel     Status: Abnormal   Collection Time: 04/14/18  9:41 AM  Result Value Ref Range   Sodium 129 (L) 135 - 145 mmol/L   Potassium 4.4 3.5 - 5.1 mmol/L   Chloride 100 (L) 101 - 111 mmol/L   CO2 23 22 - 32 mmol/L   Glucose, Bld 95 65 - 99 mg/dL   BUN 23 (H) 6 - 20 mg/dL   Creatinine, Ser 0.95 0.61 - 1.24 mg/dL   Calcium 7.6 (L) 8.9 - 10.3 mg/dL   Total Protein 5.2 (L) 6.5 - 8.1 g/dL   Albumin 1.6 (L) 3.5 - 5.0 g/dL   AST 1,108 (H) 15 - 41 U/L   ALT 303 (H) 17 - 63 U/L   Alkaline Phosphatase 108 38 - 126 U/L   Total Bilirubin 5.5 (H) 0.3 - 1.2 mg/dL   GFR calc non Af Amer >60 >60 mL/min   GFR calc Af Amer >60 >60 mL/min    Comment: (NOTE) The eGFR has been calculated using the CKD EPI equation. This calculation has not been validated in all clinical  situations. eGFR's persistently <60 mL/min signify possible Chronic Kidney Disease.    Anion gap 6 5 - 15    Comment: Performed at [facility], [place] [address] [place],  [address]   [place] Ascites (abdomen Limited)  Result Date: 04/11/2018 CLINICAL DATA:  Abdominal distension. EXAM: LIMITED ABDOMEN ULTRASOUND FOR ASCITES TECHNIQUE: Limited ultrasound survey for ascites was performed in all four abdominal quadrants. COMPARISON:  None. FINDINGS: Four quadrants  and bilateral flanks were evaluated with ultrasound. No ascites identified. IMPRESSION: No ascites. Electronically Signed   By: Franki Cabot M.D.   On: 04/05/2018 21:31    Pending Labs Unresulted Labs (From admission, onward)   Start     Ordered   04/14/18 1045  Anti-DNA antibody, double-stranded  Once,   R     04/14/18 1045   04/14/18 1045  Mitochondrial antibodies  Once,   R     04/14/18 1045   04/14/18 1045  ANA  Once,   R     04/14/18 1045   04/14/18 1045  Anti-smooth muscle antibody, IgG  Once,   R     04/14/18 1045   04/14/18 1045  CMV IgM  Once,   R     04/14/18 1045   04/14/18 1045  Epstein-Barr virus VCA, IgG  Once,   R     04/14/18 1045   04/14/18 1045  Epstein-Barr virus VCA, IgM  Once,   R     04/14/18 1045   04/14/18 1045  RSV(respiratory syncytial virus) ab, bld  Once,   R     04/14/18 1045      Vitals/Pain Today's Vitals   04/14/18 1130 04/14/18 1200 04/14/18 1215 04/14/18 1230  BP: (!) 128/33 (!) 126/34  (!) 120/40  Pulse: 82 85 84 83  Resp: (!) '25 19 17 20  '$ Temp:      TempSrc:      SpO2: (!) 88% (!) 89% 97% 97%  Weight:      Height:      PainSc:        Isolation Precautions No active isolations  Medications Medications  ondansetron (ZOFRAN) tablet 4 mg (has no administration in time range)    Or  ondansetron (ZOFRAN) injection 4 mg (has no administration in time range)  atorvastatin (LIPITOR) tablet 20 mg (20 mg Oral Given 04/14/18 0056)  pantoprazole (PROTONIX) EC  tablet 80 mg (80 mg Oral Given 04/14/18 0933)  lisinopril (PRINIVIL,ZESTRIL) tablet 10 mg (10 mg Oral Given 04/14/18 0935)  lactulose (CHRONULAC) 10 GM/15ML solution 30 g (30 g Oral Given 04/14/18 0936)  loratadine (CLARITIN) tablet 10 mg (has no administration in time range)  rifaximin (XIFAXAN) tablet 550 mg (550 mg Oral Given 04/14/18 0936)  sertraline (ZOLOFT) tablet 50 mg (50 mg Oral Given 04/14/18 1056)  saccharomyces boulardii (FLORASTOR) capsule 250 mg (250 mg Oral Given 04/14/18 0935)  penicillin v potassium (VEETID) tablet 500 mg (500 mg Oral Given 04/14/18 0934)  furosemide (LASIX) tablet 40 mg (40 mg Oral Given 04/14/18 0933)  ferrous sulfate tablet 325 mg (325 mg Oral Given 04/14/18 0934)    Mobility walks with person assist

## 2018-04-14 NOTE — ED Notes (Signed)
Patients labs hemmalyzed twice. Sent down three gold tubes via right forearm and then 2 more via left ac. Lab calls and says they need 4 more gold tubes. IV team consulted.

## 2018-04-14 NOTE — ED Notes (Signed)
Report given to [facility] [phone]. Do not send patient up per [facility] due to not having a bed in room 1514 yet. Secretary from [place] will call when ED can transport patient upstairs.

## 2018-04-14 NOTE — H&P (Signed)
History and Physical    Ryan Burgess KGU:[id number] DOB: 07-Jul-1971 DOA: 05/01/2018  PCP: Esaw Grandchild, NP  Patient coming from: Home  I have personally briefly reviewed patient's old medical records in [place]  Chief Complaint: Abd swelling  HPI: Ryan Burgess is a 47 y.o. male with medical history significant of NASH with cirrhosis, h/o hepatic encephalopathy.  He states that three weeks ago he was diagnosed with shingles on the left side of his forehead. He was started on Valtrex. Approximately a week later he started to have abdominal swelling and worsening lower leg swelling. It felt like an "inner tube" around his abdomen. He went to his doctor who was concerned that he appeared jaundiced and had him follow up with GI last Friday. When he went to see [facility] they advised him to decrease his salt and fluid intake and increased his Lasix. He was supposed to follow up with them today to recheck labs however he felt so weak that he felt he was unable to go to the office therefore EMS was called and they transported him here. He denies fever, chills, confusion, abdominal pain, vomiting, diarrhea. He has had nausea and decreased UO. He has never had these symptoms before. Abdominal US on 5/6 did not show ascites.   ED Course: No ascites on repeat US today.   Review of Systems: As per HPI otherwise 10 point review of systems negative.   Past Medical History:  Diagnosis Date  . Cellulitis   . Cirrhosis, nonalcoholic ([place])   . Dyspnea    with walking   . GERD (gastroesophageal reflux disease)   . Headache    "maybe monthly" (12/19/2016)  . Hepatic encephalopathy ([place]) ~ 2016 X 2   "first time they thought it was a TIA; dx'd hepatic encephalopathy the 2nd time it happened"  . High cholesterol   . Hypertension   . Kidney stones 01/2015   UTI  . Migraine    "none in years; used to have them frequently" (12/19/2016)  . Morbid obesity with body mass index of 50.0-59.9  in adult [facility]) 01/25/2015  . OSA on CPAP   . Pre-diabetes   . Snoring   . Venous stasis     Past Surgical History:  Procedure Laterality Date  . ESOPHAGOGASTRODUODENOSCOPY (EGD) WITH PROPOFOL N/A 07/26/2015   Procedure: ESOPHAGOGASTRODUODENOSCOPY (EGD) WITH PROPOFOL;  Surgeon: Arta Silence, MD;  Location: [facility];  Service: Endoscopy;  Laterality: N/A;  . TEE WITHOUT CARDIOVERSION N/A 04/03/2015   Procedure: TRANSESOPHAGEAL ECHOCARDIOGRAM (TEE);  Surgeon: Adrian Prows, MD;  Location: [facility];  Service: Cardiovascular;  Laterality: N/A;  . VASECTOMY    . [place]     reports that he has never smoked. He has never used smokeless tobacco. He reports that he drinks about 0.6 - 1.2 oz of alcohol per week. He reports that he does not use drugs.  No Known Allergies  Family History  Problem Relation Age of Onset  . Leukemia Mother   . Diabetes Father   . Heart disease Maternal Grandmother   . Heart disease Paternal Grandmother   . Dementia Paternal Grandmother   . Diabetes Paternal Grandmother   . Frontotemporal dementia Paternal Grandmother   . COPD Maternal Grandfather   . Heart disease Paternal Grandfather   . Diabetes Paternal Grandfather      Prior to Admission medications   Medication Sig Start Date End Date Taking? Authorizing Provider  aspirin EC 81  MG EC tablet Take 1 tablet (81 mg total) by mouth daily. 01/10/15  Yes Rai, Ripudeep K, MD  atorvastatin (LIPITOR) 20 MG tablet TAKE 1 TABLET BY MOUTH AT BEDTIME 02/26/18  Yes Danford, Katy D, NP  DEXILANT 60 MG capsule TAKE 1 CAPSULE BY MOUTH DAILY. 03/12/18  Yes Danford, Valetta Fuller D, NP  ferrous sulfate 325 (65 FE) MG EC tablet Take 325 mg by mouth daily with breakfast.   Yes [provider]  furosemide (LASIX) 40 MG tablet Take 1 tablet (40 mg total) by mouth daily. Patient taking differently: Take 40 mg by mouth 2 (two) times daily.  04/06/18  Yes Danford, Valetta Fuller D, NP  lactulose, encephalopathy,  (CHRONULAC) 10 GM/15ML SOLN Take 10 g by mouth 2 (two) times daily as needed (constipation). Take 45 mls by mouth 3 times a day 04/24/16  Yes [provider]  lisinopril (PRINIVIL,ZESTRIL) 10 MG tablet Take 10 mg by mouth daily.   Yes [provider]  penicillin v potassium (VEETID) 500 MG tablet Take 500 mg by mouth 2 (two) times daily.   Yes [provider]  rifaximin (XIFAXAN) 550 MG TABS tablet Take 1 tablet (550 mg total) by mouth 2 (two) times daily. 06/14/16  Yes Geronimo Boot, MD  saccharomyces boulardii (FLORASTOR) 250 MG capsule Take 1 capsule (250 mg total) by mouth 2 (two) times daily. 01/27/17  Yes Danford, Valetta Fuller D, NP  sertraline (ZOLOFT) 50 MG tablet Take 1 tablet (50 mg total) by mouth daily. 01/13/18  Yes Danford, Berna Spare, NP  blood glucose meter kit and supplies KIT Dispense based on patient and insurance preference. Use up to four times daily as directed. (FOR ICD-9 250.00, 250.01). 01/11/15   Rai, Ripudeep K, MD  ferrous sulfate 325 (65 FE) MG EC tablet Take 1 tablet (325 mg total) by mouth daily with breakfast. 02/12/18 03/14/18  Ardath Sax, MD  loratadine (CLARITIN) 10 MG tablet Take 10 mg by mouth daily as needed for allergies.    [provider]  Vitamin D, Ergocalciferol, (DRISDOL) 50000 units CAPS capsule TAKE 1 CAPSULE BY MOUTH EVERY 7 DAYS. 01/12/18   Mina Marble D, NP    Physical Exam: Vitals:   04/18/2018 2043 04/24/2018 2047 04/23/2018 2226 04/14/18 0014  BP: (!) 131/46  (!) 129/47 (!) 139/30  Pulse: 73  79 71  Resp: 16  16 (!) 22  Temp:      TempSrc:      SpO2: 100%  99% 96%  Weight:  (!) 244 kg (538 lb)    Height:  '6\' 4"'$  (1.93 m)      Constitutional: NAD, calm, comfortable Eyes: PERRL, lids and conjunctivae Icteric ENMT: Mucous membranes are moist. Posterior pharynx clear of any exudate or lesions.Normal dentition.  Neck: normal, supple, no masses, no thyromegaly Respiratory: clear to auscultation bilaterally, no wheezing,  no crackles. Normal respiratory effort. No accessory muscle use.  Cardiovascular: Regular rate and rhythm, no murmurs / rubs / gallops. No extremity edema. 2+ pedal pulses. No carotid bruits.  Abdomen: no tenderness, no masses palpated. No hepatosplenomegaly. Bowel sounds positive.  Musculoskeletal: no clubbing / cyanosis. No joint deformity upper and lower extremities. Good ROM, no contractures. Normal muscle tone.  Skin: Pitting edema BLE. Neurologic: CN 2-12 grossly intact. Sensation intact, DTR normal. Strength 5/5 in all 4.  Psychiatric: Normal judgment and insight. Alert and oriented x 3. Normal mood.    Labs on Admission: I have personally reviewed following labs and imaging studies  CBC:  Recent Labs  Lab 04/09/18 1123 05/01/2018 1715  WBC 5.5 4.8  NEUTROABS 3.1  --   HGB 11.6* 12.3*  HCT 33.6* 34.5*  MCV 98* 99.1  PLT 83* 81*   Basic Metabolic Panel: Recent Labs  Lab 04/09/18 1123 04/22/2018 1715  NA 134 130*  K 4.4 4.2  CL 104 100*  CO2 21 23  GLUCOSE 94 73  BUN 12 16  CREATININE 0.80 0.74  CALCIUM 7.5* 7.9*   GFR: Estimated Creatinine Clearance: 244.3 mL/min (by C-G formula based on SCr of 0.74 mg/dL). Liver Function Tests: Recent Labs  Lab 04/09/18 1123 04/20/2018 1715  AST 662* 1,171*  ALT 153* 308*  ALKPHOS 130* 119  BILITOT 4.6* 5.9*  PROT 5.8* 5.9*  ALBUMIN 2.0* 1.8*   Recent Labs  Lab 04/11/2018 1715  LIPASE 41   Recent Labs  Lab 04/09/18 1123 05/01/2018 1900  AMMONIA 116* 52*   Coagulation Profile: Recent Labs  Lab 04/07/2018 1900  INR 1.99   Cardiac Enzymes: No results for input(s): CKTOTAL, CKMB, CKMBINDEX, TROPONINI in the last 168 hours. BNP (last 3 results) No results for input(s): PROBNP in the last 8760 hours. HbA1C: No results for input(s): HGBA1C in the last 72 hours. CBG: No results for input(s): GLUCAP in the last 168 hours. Lipid Profile: No results for input(s): CHOL, HDL, LDLCALC, TRIG, CHOLHDL, LDLDIRECT in the last 72  hours. Thyroid Function Tests: No results for input(s): TSH, T4TOTAL, FREET4, T3FREE, THYROIDAB in the last 72 hours. Anemia Panel: No results for input(s): VITAMINB12, FOLATE, FERRITIN, TIBC, IRON, RETICCTPCT in the last 72 hours. Urine analysis:    Component Value Date/Time   COLORURINE AMBER (A) 04/14/2018 2016   APPEARANCEUR CLEAR 04/02/2018 2016   LABSPEC 1.019 04/03/2018 2016   PHURINE 5.0 04/21/2018 2016   GLUCOSEU NEGATIVE 04/19/2018 2016   HGBUR SMALL (A) 04/27/2018 2016   BILIRUBINUR NEGATIVE 04/12/2018 2016   KETONESUR 5 (A) 04/04/2018 2016   PROTEINUR NEGATIVE 04/12/2018 2016   UROBILINOGEN 1.0 01/28/2015 1933   NITRITE NEGATIVE 04/29/2018 2016   LEUKOCYTESUR NEGATIVE 04/11/2018 2016    Radiological Exams on Admission: [place] Ascites (abdomen Limited)  Result Date: 04/09/2018 CLINICAL DATA:  Abdominal distension. EXAM: LIMITED ABDOMEN ULTRASOUND FOR ASCITES TECHNIQUE: Limited ultrasound survey for ascites was performed in all four abdominal quadrants. COMPARISON:  None. FINDINGS: Four quadrants and bilateral flanks were evaluated with ultrasound. No ascites identified. IMPRESSION: No ascites. Electronically Signed   By: Franki Cabot M.D.   On: 04/27/2018 21:31    EKG: Independently reviewed.  Assessment/Plan Principal Problem:   Cirrhosis ([place]) Active Problems:   HTN (hypertension)   NASH (nonalcoholic steatohepatitis)    1. Cirrhosis - 1. LFTs worsening outpatient 2. EDP called GI who will see patient here in hospital tomorrow 3. Uncertain of etiology 1. Maybe AIH induced by recent shingles? (apparently this can occur). 4. Will begin work up (not comprehensive by any means) by ordering: 1. Viral hepatitis pnl 2. EBV / CMV pnl 3. ANA 4. Anti DS DNA 5. Anti mitochondrial AB 6. Anti SM AB 5. Further tests to be ordered by GI tomorrow 6. Repeat CMP in AM 7. Continue lasix 8. Continue Lactulose 2. HTN - continue home meds  DVT prophylaxis: SCDs - INR  1.99 Code Status: Full Family Communication: No family in room Disposition Plan: Home after admit Consults called: GI coming to consult in AM Admission status: Place in obs   [place], [place] [facility] Pager [phone]  If 7AM-7PM,  please contact day team taking care of patient [email or web address] Password [facility]  04/14/2018, 12:41 AM

## 2018-04-14 NOTE — Progress Notes (Addendum)
Pt seen and admitted earlier this am by my associate.   VSS Addendum: Gen: pt in nad, alert and awake CV: no cyanosis Pulm: no increased wob, no wheezes  Pt to be evaluated by GI per H and P as EDP consulted GI.  Will reassess next am.  Velvet Bathe MD  Addendum Dr. Cristina Gong has seen patient and will obtain daily CMP's. Currently suspicion is that recent zoster infection has cause increased in liver enzymes seen. Also plan is to increase patient's lasix. This was all discussed with patient at bedside.

## 2018-04-15 ENCOUNTER — Encounter: Payer: BLUE CROSS/BLUE SHIELD | Admitting: Occupational Therapy

## 2018-04-15 DIAGNOSIS — E871 Hypo-osmolality and hyponatremia: Secondary | ICD-10-CM

## 2018-04-15 DIAGNOSIS — D696 Thrombocytopenia, unspecified: Secondary | ICD-10-CM | POA: Diagnosis not present

## 2018-04-15 DIAGNOSIS — R601 Generalized edema: Secondary | ICD-10-CM | POA: Diagnosis not present

## 2018-04-15 DIAGNOSIS — M6282 Rhabdomyolysis: Secondary | ICD-10-CM | POA: Diagnosis not present

## 2018-04-15 DIAGNOSIS — K767 Hepatorenal syndrome: Secondary | ICD-10-CM | POA: Diagnosis not present

## 2018-04-15 DIAGNOSIS — K7469 Other cirrhosis of liver: Secondary | ICD-10-CM | POA: Diagnosis not present

## 2018-04-15 DIAGNOSIS — E875 Hyperkalemia: Secondary | ICD-10-CM | POA: Diagnosis not present

## 2018-04-15 DIAGNOSIS — I10 Essential (primary) hypertension: Secondary | ICD-10-CM | POA: Diagnosis not present

## 2018-04-15 DIAGNOSIS — N179 Acute kidney failure, unspecified: Secondary | ICD-10-CM | POA: Diagnosis not present

## 2018-04-15 DIAGNOSIS — B179 Acute viral hepatitis, unspecified: Secondary | ICD-10-CM | POA: Diagnosis not present

## 2018-04-15 DIAGNOSIS — K7581 Nonalcoholic steatohepatitis (NASH): Secondary | ICD-10-CM | POA: Diagnosis not present

## 2018-04-15 LAB — COMPREHENSIVE METABOLIC PANEL
ALT: 397 U/L — AB (ref 17–63)
ANION GAP: 7 (ref 5–15)
AST: 1326 U/L — ABNORMAL HIGH (ref 15–41)
Albumin: 1.6 g/dL — ABNORMAL LOW (ref 3.5–5.0)
Alkaline Phosphatase: 115 U/L (ref 38–126)
BUN: 26 mg/dL — ABNORMAL HIGH (ref 6–20)
CHLORIDE: 99 mmol/L — AB (ref 101–111)
CO2: 23 mmol/L (ref 22–32)
Calcium: 7.8 mg/dL — ABNORMAL LOW (ref 8.9–10.3)
Creatinine, Ser: 0.9 mg/dL (ref 0.61–1.24)
GFR calc non Af Amer: >60 mL/min (ref >60)
Glucose, Bld: 92 mg/dL (ref 65–99)
Potassium: 4.5 mmol/L (ref 3.5–5.1)
SODIUM: 129 mmol/L — AB (ref 135–145)
Total Bilirubin: 5.4 mg/dL — ABNORMAL HIGH (ref 0.3–1.2)
Total Protein: 5.2 g/dL — ABNORMAL LOW (ref 6.5–8.1)

## 2018-04-15 NOTE — Progress Notes (Signed)
Subjective: The patient was seen and examined at bedside. He did not measure his urine output but states that he has had more frequent urination since increasing his dosage of Lasix. He continues to have multiple loose stools with the use of lactulose.  Objective: Vital signs in last 24 hours: Temp:  [98.2 F (36.8 C)-99.3 F (37.4 C)] 98.2 F (36.8 C) (05/14 2211) Pulse Rate:  [77-87] 77 (05/14 2211) Resp:  [17-25] 20 (05/14 2211) BP: (116-134)/(33-49) 121/49 (05/14 2211) SpO2:  [88 %-99 %] 96 % (05/14 2211) Weight change:  Last BM Date: 04/15/18  SE:GBTDVVOH obese, prominent icterus GENERAL:mild pallor, not in acute distress, no asterixis, alert awake oriented times place and person ABDOMEN:obese, normoactive bowel sounds, nontender,abdominal wall edema EXTREMITIES:edema present( difficult to ascertain with morbid obesity)  Lab Results: Results for orders placed or performed during the hospital encounter of 04/19/2018 (from the past 48 hour(s))  Lipase, blood     Status: None   Collection Time: 04/29/2018  5:15 PM  Result Value Ref Range   Lipase 41 11 - 51 U/L    Comment: Performed at [facility], [place] [address], [place], [place] [address]  Comprehensive metabolic panel     Status: Abnormal   Collection Time: 04/08/2018  5:15 PM  Result Value Ref Range   Sodium 130 (L) 135 - 145 mmol/L   Potassium 4.2 3.5 - 5.1 mmol/L   Chloride 100 (L) 101 - 111 mmol/L   CO2 23 22 - 32 mmol/L   Glucose, Bld 73 65 - 99 mg/dL   BUN 16 6 - 20 mg/dL   Creatinine, Ser 0.74 0.61 - 1.24 mg/dL   Calcium 7.9 (L) 8.9 - 10.3 mg/dL   Total Protein 5.9 (L) 6.5 - 8.1 g/dL   Albumin 1.8 (L) 3.5 - 5.0 g/dL   AST 1,171 (H) 15 - 41 U/L   ALT 308 (H) 17 - 63 U/L   Alkaline Phosphatase 119 38 - 126 U/L   Total Bilirubin 5.9 (H) 0.3 - 1.2 mg/dL   GFR calc non Af Amer >60 >60 mL/min   GFR calc Af Amer >60 >60 mL/min    Comment: (NOTE) The eGFR has been calculated using the CKD EPI  equation. This calculation has not been validated in all clinical situations. eGFR's persistently <60 mL/min signify possible Chronic Kidney Disease.    Anion gap 7 5 - 15    Comment: Performed at [facility], [place] [address], [place], [place] [address]  CBC     Status: Abnormal   Collection Time: 04/24/2018  5:15 PM  Result Value Ref Range   WBC 4.8 4.0 - 10.5 K/uL   RBC 3.48 (L) 4.22 - 5.81 MIL/uL   Hemoglobin 12.3 (L) 13.0 - 17.0 g/dL   HCT 34.5 (L) 39.0 - 52.0 %   MCV 99.1 78.0 - 100.0 fL   MCH 35.3 (H) 26.0 - 34.0 pg   MCHC 35.7 30.0 - 36.0 g/dL   RDW 17.8 (H) 11.5 - 15.5 %   Platelets 81 (L) 150 - 400 K/uL    Comment: REPEATED TO VERIFY SPECIMEN CHECKED FOR CLOTS PLATELET COUNT CONFIRMED BY SMEAR Performed at [facility] [address] [place], [place] [address]   Protime-INR     Status: Abnormal   Collection Time: 04/24/2018  7:00 PM  Result Value Ref Range   Prothrombin Time 22.5 (H) 11.4 - 15.2 seconds   INR 1.99     Comment: Performed  at [facility], [place] [address] [place], [place] [address]  Ammonia     Status: Abnormal   Collection Time: 04/09/2018  7:00 PM  Result Value Ref Range   Ammonia 52 (H) 9 - 35 umol/L    Comment: Performed at [facility], [place] [address], [place], [place] [address]  Hepatitis panel, acute     Status: None   Collection Time: 04/22/2018  7:00 PM  Result Value Ref Range   Hepatitis B Surface Ag Negative Negative   HCV Ab <0.1 0.0 - 0.9 s/co ratio    Comment: (NOTE)                                  Negative:     < 0.8                             Indeterminate: 0.8 - 0.9                                  Positive:     > 0.9 The CDC recommends that a positive HCV antibody result be followed up with a HCV Nucleic Acid Amplification test (229798). Performed At: [facility] [place], [place] [id number] Rush Farmer MD YC:[id number]    Hep A  IgM Negative Negative   Hep B C IgM Negative Negative    Comment: Performed at [facility], [place] [address] [place], [place] [address]  Urinalysis, Routine w reflex microscopic     Status: Abnormal   Collection Time: 04/20/2018  8:16 PM  Result Value Ref Range   Color, Urine AMBER (A) YELLOW    Comment: BIOCHEMICALS MAY BE AFFECTED BY COLOR   APPearance CLEAR CLEAR   Specific Gravity, Urine 1.019 1.005 - 1.030   pH 5.0 5.0 - 8.0   Glucose, UA NEGATIVE NEGATIVE mg/dL   Hgb urine dipstick SMALL (A) NEGATIVE   Bilirubin Urine NEGATIVE NEGATIVE   Ketones, ur 5 (A) NEGATIVE mg/dL   Protein, ur NEGATIVE NEGATIVE mg/dL   Nitrite NEGATIVE NEGATIVE   Leukocytes, UA NEGATIVE NEGATIVE   RBC / HPF 0-5 0 - 5 RBC/hpf   WBC, UA 0-5 0 - 5 WBC/hpf   Bacteria, UA NONE SEEN NONE SEEN   Squamous Epithelial / LPF 0-5 0 - 5   Mucus PRESENT     Comment: Performed at [facility], [place] [address], [place], [place] [address]  CBC     Status: Abnormal   Collection Time: 04/14/18  9:41 AM  Result Value Ref Range   WBC 5.8 4.0 - 10.5 K/uL   RBC 2.92 (L) 4.22 - 5.81 MIL/uL   Hemoglobin 10.3 (L) 13.0 - 17.0 g/dL   HCT 29.0 (L) 39.0 - 52.0 %   MCV 99.3 78.0 - 100.0 fL   MCH 35.3 (H) 26.0 - 34.0 pg   MCHC 35.5 30.0 - 36.0 g/dL   RDW 18.0 (H) 11.5 - 15.5 %   Platelets 81 (L) 150 - 400 K/uL    Comment: CONSISTENT WITH PREVIOUS RESULT Performed at [place] [address] [place], [place] [address]   Comprehensive metabolic panel     Status: Abnormal   Collection Time: 04/14/18  9:41 AM  Result Value Ref Range  Sodium 129 (L) 135 - 145 mmol/L   Potassium 4.4 3.5 - 5.1 mmol/L   Chloride 100 (L) 101 - 111 mmol/L   CO2 23 22 - 32 mmol/L   Glucose, Bld 95 65 - 99 mg/dL   BUN 23 (H) 6 - 20 mg/dL   Creatinine, Ser 0.95 0.61 - 1.24 mg/dL   Calcium 7.6 (L) 8.9 - 10.3 mg/dL   Total Protein 5.2 (L) 6.5 - 8.1 g/dL   Albumin 1.6 (L) 3.5 - 5.0 g/dL    AST 1,108 (H) 15 - 41 U/L   ALT 303 (H) 17 - 63 U/L   Alkaline Phosphatase 108 38 - 126 U/L   Total Bilirubin 5.5 (H) 0.3 - 1.2 mg/dL   GFR calc non Af Amer >60 >60 mL/min   GFR calc Af Amer >60 >60 mL/min    Comment: (NOTE) The eGFR has been calculated using the CKD EPI equation. This calculation has not been validated in all clinical situations. eGFR's persistently <60 mL/min signify possible Chronic Kidney Disease.    Anion gap 6 5 - 15    Comment: Performed at [facility], [place] [address], [place], [place] [address]  MRSA PCR Screening     Status: None   Collection Time: 04/14/18  3:19 PM  Result Value Ref Range   MRSA by PCR NEGATIVE NEGATIVE    Comment:        The GeneXpert MRSA Assay (FDA approved for NASAL specimens only), is one component of a comprehensive MRSA colonization surveillance program. It is not intended to diagnose MRSA infection nor to guide or monitor treatment for MRSA infections. Performed at [facility], [place] [address], [place], [place] [address]   Comprehensive metabolic panel     Status: Abnormal   Collection Time: 04/15/18  5:40 AM  Result Value Ref Range   Sodium 129 (L) 135 - 145 mmol/L   Potassium 4.5 3.5 - 5.1 mmol/L   Chloride 99 (L) 101 - 111 mmol/L   CO2 23 22 - 32 mmol/L   Glucose, Bld 92 65 - 99 mg/dL   BUN 26 (H) 6 - 20 mg/dL   Creatinine, Ser 0.90 0.61 - 1.24 mg/dL   Calcium 7.8 (L) 8.9 - 10.3 mg/dL   Total Protein 5.2 (L) 6.5 - 8.1 g/dL   Albumin 1.6 (L) 3.5 - 5.0 g/dL   AST 1,326 (H) 15 - 41 U/L   ALT 397 (H) 17 - 63 U/L   Alkaline Phosphatase 115 38 - 126 U/L   Total Bilirubin 5.4 (H) 0.3 - 1.2 mg/dL   GFR calc non Af Amer >60 >60 mL/min   GFR calc Af Amer >60 >60 mL/min    Comment: (NOTE) The eGFR has been calculated using the CKD EPI equation. This calculation has not been validated in all clinical situations. eGFR's persistently <60 mL/min signify possible Chronic  Kidney Disease.    Anion gap 7 5 - 15    Comment: Performed at [facility], [place] [address], [place], [place] [address]    Studies/Results: [place] Ascites (abdomen Limited)  Result Date: 04/18/2018 CLINICAL DATA:  Abdominal distension. EXAM: LIMITED ABDOMEN ULTRASOUND FOR ASCITES TECHNIQUE: Limited ultrasound survey for ascites was performed in all four abdominal quadrants. COMPARISON:  None. FINDINGS: Four quadrants and bilateral flanks were evaluated with ultrasound. No ascites identified. IMPRESSION: No ascites. Electronically Signed   By: Franki Cabot M.D.   On: 04/23/2018 21:31    Medications: I have  reviewed the patient's current medications.  Assessment: 1.NASH related cirrhosis, abnormal LFTs(t bili 5.4/AST 13206/AST 397/ALP 115), could be secondary to recent herpes infection No ascites noted an ultrasound from 04/14/18 No esophageal varices noted on EGD from 2016. History of hepatic encephalopathy-maintained on lactulose and Xifaxan  2.Morbid obesity  3.Macrocytic anemia  4. Hyponatremia  5. Elevated BUN of 26 with normal creatinine and normal GFR.  Plan: 1.Labs pending for ASMA, CMV IgM, AMA, EBV-if unremarkable patient will need transjugular liver biopsy.  2.Continue furosemide 80 mg twice a day,discussed with nursing staff patient will need strict intake and output measurement and daily weights. Patient does not tolerate spironolactone due to severe heartburn.  3.Continue lactulose and Xifaxan, no signs of encephalopathy currently.  4.Discussed with patient regarding evaluation for bariatric surgery. Patient states that he had discussed this in the past and was willing to have laparoscopic banding. Discussed with Dr. Algis Liming that patient will benefit from surgical evaluation while inpatient for bariatric surgery in the future.  Ronnette Juniper 04/15/2018, 7:57 AM   Pager [phone] If no answer or after 5 PM call [phone]

## 2018-04-15 NOTE — Progress Notes (Signed)
PROGRESS NOTE   Ryan Burgess  OAC:[id number]    DOB: August 18, 1971    DOA: 04/14/2018  PCP: Esaw Grandchild, NP   I have briefly reviewed patients previous medical records in [facility].  Brief Narrative:  47 year old male with PMH of NASH cirrhosis complicated by hepatic encephalopathy & chronic thrombocytopenia, chronic lower extremity edema, morbid obesity, GERD, HLD, HTN, OSA on CPAP, prediabetes, treated 3 weeks PTA for periorbital shingles with Valtrex, presented to ED with generalized weakness, weight gain of approximately 30 pounds in 3 weeks and significantly worsened LFTs.  [facility] and [facility] consulted.   Assessment & Plan:   Principal Problem:   Cirrhosis ([place]) Active Problems:   HTN (hypertension)   NASH (nonalcoholic steatohepatitis)   NASH cirrhosis with worsening LFTs: [facility] input appreciated.  Discussed with Dr. Therisa Doyne > ASMA, CMV IgM, EBV pending and if unremarkable will need transjugular liver biopsy, continued on furosemide 80 mg twice daily, strict intake output and daily weights, patient does not tolerate spironolactone due to severe heartburn, continue lactulose and Xifaxan.  His worsening LFTs may be secondary to recent herpes infection.  Abdominal ultrasound 5/14: No significant ascites.  EGD 2016 no esophageal varices.  LFTs have worsened since yesterday.  Follow labs in a.m. ?  Hold statins.  History of hepatic encephalopathy: No clinical encephalopathy at this time.  Continue lactulose and Xifaxan.  Morbid obesity/Body mass index is 65.09 kg/m.  As per Dr. Therisa Doyne, CCS consulted to see if any GI work-up is warranted while hospitalized which may be easier to do.  Hyponatremia: Stable.  Likely secondary to liver disease.  Macrocytic anemia: No bleeding reported.  Relatively stable.  Thrombocytopenia: Likely related to cirrhosis.  No bleeding reported.  Essential hypertension: Controlled.  Continue lisinopril.    DVT prophylaxis: SCDs.   Coagulopathic/INR 1.99 Code Status: Full Family Communication: None at bedside Disposition: DC home pending clinical improvement.   Consultants:  [facility]  Procedures:  None  Antimicrobials:  None   Subjective: Feels better than on admission.  Reports swelling of all limbs, abdominal bloating and weight gain of about 30 pounds over the last 3 weeks but swelling seems to have improved since admission.  Denies abdominal pain.  Tolerating diet.  ROS: As above  Objective:  Vitals:   04/14/18 1457 04/14/18 2211 04/15/18 0700 04/15/18 1433  BP: (!) 134/43 (!) 121/49  (!) 147/43  Pulse: 79 77  75  Resp: 19 20  16   Temp: 99.3 F (37.4 C) 98.2 F (36.8 C)  (!) 97.4 F (36.3 C)  TempSrc:    Oral  SpO2: 97% 96%  95%  Weight:   (!) 242.5 kg (534 lb 11.2 oz)   Height:        Examination:  General exam: Pleasant young male, moderately built and morbidly obese, lying comfortably supine in bed. Respiratory system: Clear to auscultation. Respiratory effort normal. Cardiovascular system: S1 & S2 heard, RRR. No JVD, murmurs, rubs, gallops or clicks.  1+ pitting bilateral leg edema. Gastrointestinal system: Abdomen is nondistended/obese, soft and nontender. No organomegaly or masses felt. Normal bowel sounds heard. Central nervous system: Alert and oriented. No focal neurological deficits. Extremities: Symmetric 5 x 5 power. Skin: No rashes, lesions or ulcers Psychiatry: Judgement and insight appear normal. Mood & affect appropriate.     Data Reviewed: I have personally reviewed following labs and imaging studies  CBC: Recent Labs  Lab 04/09/18 1123 04/09/2018 1715 04/14/18 0941  WBC 5.5 4.8  5.8  NEUTROABS 3.1  --   --   HGB 11.6* 12.3* 10.3*  HCT 33.6* 34.5* 29.0*  MCV 98* 99.1 99.3  PLT 83* 81* 81*   Basic Metabolic Panel: Recent Labs  Lab 04/09/18 1123 04/27/2018 1715 04/14/18 0941 04/15/18 0540  NA 134 130* 129* 129*  K 4.4 4.2 4.4 4.5  CL 104 100* 100* 99*   CO2 21 23 23 23   GLUCOSE 94 73 95 92  BUN 12 16 23* 26*  CREATININE 0.80 0.74 0.95 0.90  CALCIUM 7.5* 7.9* 7.6* 7.8*   Liver Function Tests: Recent Labs  Lab 04/09/18 1123 04/12/2018 1715 04/14/18 0941 04/15/18 0540  AST 662* 1,171* 1,108* 1,326*  ALT 153* 308* 303* 397*  ALKPHOS 130* 119 108 115  BILITOT 4.6* 5.9* 5.5* 5.4*  PROT 5.8* 5.9* 5.2* 5.2*  ALBUMIN 2.0* 1.8* 1.6* 1.6*   Coagulation Profile: Recent Labs  Lab 04/21/2018 1900  INR 1.99     Recent Results (from the past 240 hour(s))  MRSA PCR Screening     Status: None   Collection Time: 04/14/18  3:19 PM  Result Value Ref Range Status   MRSA by PCR NEGATIVE NEGATIVE Final    Comment:        The GeneXpert MRSA Assay (FDA approved for NASAL specimens only), is one component of a comprehensive MRSA colonization surveillance program. It is not intended to diagnose MRSA infection nor to guide or monitor treatment for MRSA infections. Performed at [facility], [place] [address] [place], [place] [address]          Radiology Studies: [place] Ascites (abdomen Limited)  Result Date: 04/27/2018 CLINICAL DATA:  Abdominal distension. EXAM: LIMITED ABDOMEN ULTRASOUND FOR ASCITES TECHNIQUE: Limited ultrasound survey for ascites was performed in all four abdominal quadrants. COMPARISON:  None. FINDINGS: Four quadrants and bilateral flanks were evaluated with ultrasound. No ascites identified. IMPRESSION: No ascites. Electronically Signed   By: Franki Cabot M.D.   On: 04/27/2018 21:31        Scheduled Meds: . atorvastatin  20 mg Oral QHS  . chlorhexidine  15 mL Mouth Rinse BID  . ferrous sulfate  325 mg Oral Q breakfast  . furosemide  80 mg Oral BID  . lactulose  30 g Oral TID  . lisinopril  10 mg Oral Daily  . mouth rinse  15 mL Mouth Rinse q12n4p  . nystatin   Topical TID  . pantoprazole  80 mg Oral Daily  . penicillin v potassium  500 mg Oral BID  . rifaximin  550 mg Oral BID  .  saccharomyces boulardii  250 mg Oral BID  . sertraline  50 mg Oral Daily   Continuous Infusions:   LOS: 0 days     Vernell Leep, MD, FACP, [facility]. [facility] Pager [phone]  If 7PM-7AM, please contact night-coverage [email or web address] Password [facility] 04/15/2018, 4:22 PM

## 2018-04-15 NOTE — Progress Notes (Signed)
Nutrition Education Note  RD received consult for pt regarding cirrhosis education.  RD provided "Low Sodium Nutrition Therapy" handout from the Academy of Nutrition and Dietetics. Reviewed patient's dietary recall. Provided examples on ways to decrease sodium intake in diet. Discouraged intake of processed foods and use of salt shaker. Pt was provided with examples of high sodium foods and provided with names of salt-free alternatives. Encouraged fresh or frozen fruits and vegetables. Pt was also educated on the importance of regular weights and limiting fluid from the diet if advised by MD.  RD discussed why it is important for patient to adhere to diet recommendations, and emphasized the role of fluids, foods to avoid, and importance of weighing self daily. Teach back method used.  Pt report that him and his wife have been trying to follow a 2 gram sodium diet for over one week. States he is beginning to notice that salt "is in everything". He is well versed with reading nutrition labels.   Expect fair compliance.  Body mass index is 65.09 kg/m. Pt meets criteria for morbid obesity based on current BMI.  Current diet order is 2 gram sodium, patient is consuming approximately 100% of meals at this time. Labs and medications reviewed. No further nutrition interventions warranted at this time. RD contact information provided. If additional nutrition issues arise, please re-consult RD.    Mariana Single RD, LDN Clinical Nutrition Pager # [phone]

## 2018-04-15 NOTE — Progress Notes (Signed)
Ask to see and evaluate for bariatric surgery.  We will have our Bariatric Coordinator see him and give baseline information about how to begin the process.  He will need to go thru the Bariatric program.  This will take some time, and can be handled as an outpatient. I will leave Dr. Pollie Friar information in the AVS as a contact after he has completed the program.

## 2018-04-16 DIAGNOSIS — D689 Coagulation defect, unspecified: Secondary | ICD-10-CM | POA: Diagnosis not present

## 2018-04-16 DIAGNOSIS — B179 Acute viral hepatitis, unspecified: Secondary | ICD-10-CM | POA: Diagnosis present

## 2018-04-16 DIAGNOSIS — Z6841 Body Mass Index (BMI) 40.0 and over, adult: Secondary | ICD-10-CM | POA: Diagnosis not present

## 2018-04-16 DIAGNOSIS — R579 Shock, unspecified: Secondary | ICD-10-CM | POA: Diagnosis not present

## 2018-04-16 DIAGNOSIS — M6282 Rhabdomyolysis: Secondary | ICD-10-CM | POA: Diagnosis present

## 2018-04-16 DIAGNOSIS — K766 Portal hypertension: Secondary | ICD-10-CM | POA: Diagnosis present

## 2018-04-16 DIAGNOSIS — K7460 Unspecified cirrhosis of liver: Secondary | ICD-10-CM | POA: Diagnosis present

## 2018-04-16 DIAGNOSIS — K7581 Nonalcoholic steatohepatitis (NASH): Secondary | ICD-10-CM | POA: Diagnosis present

## 2018-04-16 DIAGNOSIS — K767 Hepatorenal syndrome: Secondary | ICD-10-CM | POA: Diagnosis not present

## 2018-04-16 DIAGNOSIS — D696 Thrombocytopenia, unspecified: Secondary | ICD-10-CM | POA: Diagnosis not present

## 2018-04-16 DIAGNOSIS — Z66 Do not resuscitate: Secondary | ICD-10-CM | POA: Diagnosis not present

## 2018-04-16 DIAGNOSIS — I472 Ventricular tachycardia: Secondary | ICD-10-CM | POA: Diagnosis not present

## 2018-04-16 DIAGNOSIS — R140 Abdominal distension (gaseous): Secondary | ICD-10-CM | POA: Diagnosis present

## 2018-04-16 DIAGNOSIS — F419 Anxiety disorder, unspecified: Secondary | ICD-10-CM | POA: Diagnosis not present

## 2018-04-16 DIAGNOSIS — E162 Hypoglycemia, unspecified: Secondary | ICD-10-CM | POA: Diagnosis not present

## 2018-04-16 DIAGNOSIS — K7200 Acute and subacute hepatic failure without coma: Secondary | ICD-10-CM | POA: Diagnosis present

## 2018-04-16 DIAGNOSIS — R601 Generalized edema: Secondary | ICD-10-CM | POA: Diagnosis not present

## 2018-04-16 DIAGNOSIS — Z515 Encounter for palliative care: Secondary | ICD-10-CM | POA: Diagnosis not present

## 2018-04-16 DIAGNOSIS — D539 Nutritional anemia, unspecified: Secondary | ICD-10-CM | POA: Diagnosis present

## 2018-04-16 DIAGNOSIS — E860 Dehydration: Secondary | ICD-10-CM | POA: Diagnosis not present

## 2018-04-16 DIAGNOSIS — D684 Acquired coagulation factor deficiency: Secondary | ICD-10-CM | POA: Diagnosis present

## 2018-04-16 DIAGNOSIS — E785 Hyperlipidemia, unspecified: Secondary | ICD-10-CM | POA: Diagnosis present

## 2018-04-16 DIAGNOSIS — N179 Acute kidney failure, unspecified: Secondary | ICD-10-CM | POA: Diagnosis not present

## 2018-04-16 DIAGNOSIS — E7800 Pure hypercholesterolemia, unspecified: Secondary | ICD-10-CM | POA: Diagnosis present

## 2018-04-16 DIAGNOSIS — K7469 Other cirrhosis of liver: Secondary | ICD-10-CM | POA: Diagnosis not present

## 2018-04-16 DIAGNOSIS — E8809 Other disorders of plasma-protein metabolism, not elsewhere classified: Secondary | ICD-10-CM | POA: Diagnosis present

## 2018-04-16 DIAGNOSIS — N170 Acute kidney failure with tubular necrosis: Secondary | ICD-10-CM | POA: Diagnosis present

## 2018-04-16 DIAGNOSIS — Z7189 Other specified counseling: Secondary | ICD-10-CM | POA: Diagnosis not present

## 2018-04-16 DIAGNOSIS — I119 Hypertensive heart disease without heart failure: Secondary | ICD-10-CM | POA: Diagnosis present

## 2018-04-16 DIAGNOSIS — K7210 Chronic hepatic failure without coma: Secondary | ICD-10-CM | POA: Diagnosis present

## 2018-04-16 DIAGNOSIS — G4733 Obstructive sleep apnea (adult) (pediatric): Secondary | ICD-10-CM | POA: Diagnosis not present

## 2018-04-16 DIAGNOSIS — E875 Hyperkalemia: Secondary | ICD-10-CM | POA: Diagnosis not present

## 2018-04-16 DIAGNOSIS — E871 Hypo-osmolality and hyponatremia: Secondary | ICD-10-CM | POA: Diagnosis present

## 2018-04-16 LAB — COMPREHENSIVE METABOLIC PANEL
ALT: 397 U/L — ABNORMAL HIGH (ref 17–63)
ANION GAP: 7 (ref 5–15)
AST: 1228 U/L — ABNORMAL HIGH (ref 15–41)
Albumin: 1.6 g/dL — ABNORMAL LOW (ref 3.5–5.0)
Alkaline Phosphatase: 156 U/L — ABNORMAL HIGH (ref 38–126)
BUN: 30 mg/dL — ABNORMAL HIGH (ref 6–20)
CHLORIDE: 99 mmol/L — AB (ref 101–111)
CO2: 23 mmol/L (ref 22–32)
Calcium: 7.8 mg/dL — ABNORMAL LOW (ref 8.9–10.3)
Creatinine, Ser: 0.97 mg/dL (ref 0.61–1.24)
Glucose, Bld: 88 mg/dL (ref 65–99)
POTASSIUM: 4.4 mmol/L (ref 3.5–5.1)
Sodium: 129 mmol/L — ABNORMAL LOW (ref 135–145)
Total Bilirubin: 5.3 mg/dL — ABNORMAL HIGH (ref 0.3–1.2)
Total Protein: 5.2 g/dL — ABNORMAL LOW (ref 6.5–8.1)

## 2018-04-16 LAB — CBC
HEMATOCRIT: 29.4 % — AB (ref 39.0–52.0)
Hemoglobin: 10.4 g/dL — ABNORMAL LOW (ref 13.0–17.0)
MCH: 35.1 pg — ABNORMAL HIGH (ref 26.0–34.0)
MCHC: 35.4 g/dL (ref 30.0–36.0)
MCV: 99.3 fL (ref 78.0–100.0)
Platelets: 83 10*3/uL — ABNORMAL LOW (ref 150–400)
RBC: 2.96 MIL/uL — AB (ref 4.22–5.81)
RDW: 18.5 % — ABNORMAL HIGH (ref 11.5–15.5)
WBC: 6.7 10*3/uL (ref 4.0–10.5)

## 2018-04-16 LAB — EPSTEIN-BARR VIRUS VCA, IGM: EBV VCA IgM: <36 U/mL (ref 0.0–35.9)

## 2018-04-16 LAB — ANA: Anti Nuclear Antibody(ANA): NEGATIVE

## 2018-04-16 LAB — CMV IGM

## 2018-04-16 LAB — EPSTEIN-BARR VIRUS VCA, IGG

## 2018-04-16 LAB — ANTI-DNA ANTIBODY, DOUBLE-STRANDED: DS DNA AB: 1 [IU]/mL (ref 0–9)

## 2018-04-16 MED ORDER — FUROSEMIDE 40 MG PO TABS
120.0000 mg | ORAL_TABLET | Freq: Every morning | ORAL | Status: DC
  Administered 2018-04-16 – 2018-04-17 (×2): 120 mg via ORAL
  Filled 2018-04-16 (×2): qty 3

## 2018-04-16 MED ORDER — FUROSEMIDE 40 MG PO TABS
80.0000 mg | ORAL_TABLET | Freq: Every day | ORAL | Status: DC
  Administered 2018-04-16: 80 mg via ORAL
  Filled 2018-04-16: qty 2

## 2018-04-16 NOTE — Progress Notes (Addendum)
PROGRESS NOTE   Ryan Burgess  SHU:[id number]    DOB: 1970-12-03    DOA: 04/11/2018  PCP: Esaw Grandchild, NP   I have briefly reviewed patients previous medical records in [facility].  Brief Narrative:  47 year old male with PMH of NASH cirrhosis complicated by hepatic encephalopathy & chronic thrombocytopenia, chronic lower extremity edema, morbid obesity, GERD, HLD, HTN, OSA on CPAP, prediabetes, treated 3 weeks PTA for periorbital shingles with Valtrex, presented to ED with generalized weakness, weight gain of approximately 30 pounds in 3 weeks and significantly worsened LFTs.  [facility] and [facility] consulted.   Assessment & Plan:   Principal Problem:   Cirrhosis ([place]) Active Problems:   HTN (hypertension)   NASH (nonalcoholic steatohepatitis)   NASH cirrhosis with worsening LFTs/acute hepatitis: [facility] input appreciated.  Discussed with Dr. Therisa Doyne > ASMA, CMV IgM, EBV pending and if unremarkable will need transjugular liver biopsy, continued on furosemide 80 mg twice daily, strict intake output and daily weights, patient does not tolerate spironolactone due to severe heartburn, continue lactulose and Xifaxan.  His worsening LFTs may be secondary to recent herpes infection.  Abdominal ultrasound 5/14: No significant ascites.  EGD 2016 no esophageal varices.  LFTs without much change since yesterday.  GI follow-up appreciated and have increased Lasix dose.  Follow LFTs in a.m.  History of hepatic encephalopathy: No clinical encephalopathy at this time.  Continue lactulose and Xifaxan.  Stable.  Morbid obesity/Body mass index is 66.95 kg/m.  As per Dr. Therisa Doyne, CCS consulted to see if any GI work-up is warranted while hospitalized which may be easier to do.  CCS recommends outpatient follow-up.  Hyponatremia: Stable.  Likely secondary to liver disease.  Macrocytic anemia: No bleeding reported.  Relatively stable.  Thrombocytopenia: Likely related to cirrhosis.  No bleeding  reported.  Stable.  Essential hypertension: Controlled.  Continue lisinopril.  OSA: Continue nightly CPAP.    DVT prophylaxis: SCDs.  Coagulopathic/INR 1.99 Code Status: Full Family Communication: None at bedside Disposition: Patient is unstable due to significantly abnormal LFTs from baseline that have not yet started to show improvement, need close daily LFT monitoring and due to significant volume overload/anasarca being treated with high-dose Lasix that requires close BMP monitoring.  Thereby he needs inpatient care for greater than 2 midnights and will change status as such.   Consultants:  [facility]  Procedures:  None  Antimicrobials:  None   Subjective: Reports that he still has reasonable urine output but nothing great.  Reports significant swelling of his limbs with some improvement.  No chest pain or dyspnea.  No abdominal pain.  ROS: As above  Objective:  Vitals:   04/15/18 1433 04/15/18 2010 04/16/18 0512 04/16/18 0645  BP: (!) 147/43 (!) 124/44 (!) 116/28   Pulse: 75 74 75   Resp: 16 (!) 22 19   Temp: (!) 97.4 F (36.3 C) 97.7 F (36.5 C) 97.8 F (36.6 C)   TempSrc: Oral Oral Oral   SpO2: 95% 92% 96%   Weight:    (!) 249.5 kg (550 lb)  Height:        Examination:  General exam: Pleasant young male, moderately built and morbidly obese, lying comfortably supine in bed. Respiratory system: Clear to auscultation. Respiratory effort normal.  Stable Cardiovascular system: S1 & S2 heard, RRR. No JVD, murmurs, rubs, gallops or clicks.  1+ pitting bilateral leg edema.  Not much change/stable Gastrointestinal system: Abdomen is nondistended/obese, soft and nontender. No organomegaly or masses  felt. Normal bowel sounds heard.  Stable Central nervous system: Alert and oriented. No focal neurological deficits. Extremities: Symmetric 5 x 5 power. Skin: No rashes, lesions or ulcers Psychiatry: Judgement and insight appear normal. Mood & affect appropriate.      Data Reviewed: I have personally reviewed following labs and imaging studies  CBC: Recent Labs  Lab 04/27/2018 1715 04/14/18 0941 04/16/18 0602  WBC 4.8 5.8 6.7  HGB 12.3* 10.3* 10.4*  HCT 34.5* 29.0* 29.4*  MCV 99.1 99.3 99.3  PLT 81* 81* 83*   Basic Metabolic Panel: Recent Labs  Lab 04/08/2018 1715 04/14/18 0941 04/15/18 0540 04/16/18 0602  NA 130* 129* 129* 129*  K 4.2 4.4 4.5 4.4  CL 100* 100* 99* 99*  CO2 23 23 23 23   GLUCOSE 73 95 92 88  BUN 16 23* 26* 30*  CREATININE 0.74 0.95 0.90 0.97  CALCIUM 7.9* 7.6* 7.8* 7.8*   Liver Function Tests: Recent Labs  Lab 04/22/2018 1715 04/14/18 0941 04/15/18 0540 04/16/18 0602  AST 1,171* 1,108* 1,326* 1,228*  ALT 308* 303* 397* 397*  ALKPHOS 119 108 115 156*  BILITOT 5.9* 5.5* 5.4* 5.3*  PROT 5.9* 5.2* 5.2* 5.2*  ALBUMIN 1.8* 1.6* 1.6* 1.6*   Coagulation Profile: Recent Labs  Lab 04/12/2018 1900  INR 1.99     Recent Results (from the past 240 hour(s))  MRSA PCR Screening     Status: None   Collection Time: 04/14/18  3:19 PM  Result Value Ref Range Status   MRSA by PCR NEGATIVE NEGATIVE Final    Comment:        The GeneXpert MRSA Assay (FDA approved for NASAL specimens only), is one component of a comprehensive MRSA colonization surveillance program. It is not intended to diagnose MRSA infection nor to guide or monitor treatment for MRSA infections. Performed at [facility], [place] [address], [place], [place] [address]          Radiology Studies: No results found.      Scheduled Meds: . atorvastatin  20 mg Oral QHS  . chlorhexidine  15 mL Mouth Rinse BID  . ferrous sulfate  325 mg Oral Q breakfast  . furosemide  120 mg Oral q morning - 10a  . furosemide  80 mg Oral Daily  . lactulose  30 g Oral TID  . lisinopril  10 mg Oral Daily  . mouth rinse  15 mL Mouth Rinse q12n4p  . nystatin   Topical TID  . pantoprazole  80 mg Oral Daily  . penicillin v potassium  500 mg  Oral BID  . rifaximin  550 mg Oral BID  . saccharomyces boulardii  250 mg Oral BID  . sertraline  50 mg Oral Daily   Continuous Infusions:   LOS: 0 days     Vernell Leep, MD, FACP, [facility]. [facility] Pager [phone]  If 7PM-7AM, please contact night-coverage [email or web address] Password [email or web address] 04/16/2018, 2:52 PM

## 2018-04-16 NOTE — Progress Notes (Signed)
Input from surgeons (regarding possible bariatric surgery candidacy) and dietitian (concerning sodium restricted diet) reviewed and appreciated.  On furosemide 80 mg twice daily, the patient's weight has supposedly increased 16 pounds over the past day (obviously spurious result).  His BUN has crept up to 30, creatinine has remained essentially stable, potassium remains normal, and the patient subjectively thinks she is having reasonable urine output but nothing dramatic.  He so far has not noticed any subjective improvement in his edema.  Meanwhile, his liver chemistries remain about the same, definitely no trend toward improvement.  Will increase morning dose of furosemide to 120 mg and continue to observe renal function, electrolytes, and liver chemistries.  Ryan Burgess, M.D. Pager [phone] If no answer or after 5 PM call [phone]

## 2018-04-17 DIAGNOSIS — R601 Generalized edema: Secondary | ICD-10-CM

## 2018-04-17 LAB — COMPREHENSIVE METABOLIC PANEL
ALT: 389 U/L — AB (ref 17–63)
AST: 1176 U/L — AB (ref 15–41)
Albumin: 1.5 g/dL — ABNORMAL LOW (ref 3.5–5.0)
Alkaline Phosphatase: 122 U/L (ref 38–126)
Anion gap: 6 (ref 5–15)
BILIRUBIN TOTAL: 5.5 mg/dL — AB (ref 0.3–1.2)
BUN: 33 mg/dL — AB (ref 6–20)
CO2: 24 mmol/L (ref 22–32)
CREATININE: 1.1 mg/dL (ref 0.61–1.24)
Calcium: 7.8 mg/dL — ABNORMAL LOW (ref 8.9–10.3)
Chloride: 100 mmol/L — ABNORMAL LOW (ref 101–111)
GFR calc Af Amer: >60 mL/min (ref >60)
Glucose, Bld: 86 mg/dL (ref 65–99)
Potassium: 4.4 mmol/L (ref 3.5–5.1)
Sodium: 130 mmol/L — ABNORMAL LOW (ref 135–145)
TOTAL PROTEIN: 5 g/dL — AB (ref 6.5–8.1)

## 2018-04-17 LAB — MITOCHONDRIAL ANTIBODIES: Mitochondrial M2 Ab, IgG: <20 Units (ref 0.0–20.0)

## 2018-04-17 LAB — ANTI-SMOOTH MUSCLE ANTIBODY, IGG: F-Actin IgG: 15 Units (ref 0–19)

## 2018-04-17 LAB — RSV(RESPIRATORY SYNCYTIAL VIRUS) AB, BLOOD: RSV Ab: 1:32 {titer} — ABNORMAL HIGH

## 2018-04-17 MED ORDER — FUROSEMIDE 10 MG/ML IJ SOLN
80.0000 mg | Freq: Two times a day (BID) | INTRAMUSCULAR | Status: DC
  Administered 2018-04-17: 80 mg via INTRAVENOUS
  Filled 2018-04-17: qty 8

## 2018-04-17 NOTE — Progress Notes (Signed)
PROGRESS NOTE   Ryan Burgess  MVH:[id number]    DOB: [birth year]    DOA: 04/24/2018  PCP: Esaw Grandchild, NP   I have briefly reviewed patients previous medical records in [facility].  Brief Narrative:  47 year old male with PMH of NASH cirrhosis complicated by hepatic encephalopathy & chronic thrombocytopenia, chronic lower extremity edema, morbid obesity, GERD, HLD, HTN, OSA on CPAP, prediabetes, treated 3 weeks PTA for periorbital shingles with Valtrex, presented to ED with generalized weakness, weight gain of approximately 30 pounds in 3 weeks and significantly worsened LFTs.  [facility] and [facility] consulted.  Anasarca not responding adequately to oral Lasix.  Switched to IV Lasix 5/17.  If no significant improvement clinically or LFTs, needs further evaluation early next week including EGD, TEE and possible transjugular liver biopsy.   Assessment & Plan:   Principal Problem:   Cirrhosis ([place]) Active Problems:   HTN (hypertension)   NASH (nonalcoholic steatohepatitis)   Acute hepatitis   NASH cirrhosis with worsening LFTs/acute hepatitis: [facility] consulted and continue to assist with management.  Acute hepatitis panel, ds DNA Ab, ANA, CMV IgM negative.  EBV IgM negative, IgG positive (>600).  Antimitochondrial antibodies, ASMA, RSV: Pending.  Abdominal ultrasound 5/14: No significant ascites.  Worsening LFTs may be secondary to recent herpes infection.  LFTs have slightly improved as below but still markedly abnormal.  I discussed with Dr. Cristina Gong, [facility] who indicates that if patient does not make significant improvement in the next couple days, will need further inpatient evaluation.  Lasix as below for anasarca.  Discontinued statins temporarily.  Anasarca: Secondary to liver disease.  Despite high doses of oral Lasix, not much diuresis.  Discussed with Dr. Cristina Gong and changed to IV Lasix 80 mg every 12 hours.  Follow strict intake output daily weights and BMP.  History of  hepatic encephalopathy: No clinical encephalopathy at this time.  Continue lactulose and Xifaxan.  Ammonia mildly elevated at 52.  Stable.  Morbid obesity/Body mass index is 71.7 kg/m.  As per Dr. Therisa Doyne, CCS consulted to see if any GI work-up is warranted while hospitalized which may be easier to do.  CCS recommends outpatient follow-up.  Hyponatremia: Stable.  Likely secondary to liver disease.  Macrocytic anemia: No bleeding reported.  Relatively stable.  Thrombocytopenia: Likely related to cirrhosis.  No bleeding reported.  Stable.  Essential hypertension: Controlled.  Continue lisinopril.  OSA: Continue nightly CPAP.    DVT prophylaxis: SCDs.  Coagulopathic/INR 1.99 Code Status: Full Family Communication: None at bedside Disposition: Patient is unstable due to significantly abnormal LFTs from baseline that have not yet started to show consistent improvement, anasarca resistant to high-dose oral Lasix and transition to IV Lasix, may need further inpatient work-up if does not improve.  Remains inpatient.  Consultants:  [facility]  Procedures:  None  Antimicrobials:  None   Subjective: States that even though his urine output has improved after increased dose of oral Lasix, still has significant "bloating" of his arms, legs and abdomen.  No dyspnea reported.  ROS: As above  Objective:  Vitals:   04/16/18 0645 04/16/18 1457 04/16/18 2148 04/17/18 0603  BP:  (!) 112/35 (!) 148/43 (!) 124/35  Pulse:  76 74 75  Resp:  18 18 20   Temp:  (!) 97.5 F (36.4 C)  98.2 F (36.8 C)  TempSrc:  Oral    SpO2:  96% 95% 97%  Weight: (!) 249.5 kg (550 lb)   (!) 267.2 kg (  589 lb)  Height:        Examination:  General exam: Pleasant young male, moderately built and morbidly obese, lying comfortably supine in bed. Respiratory system: Clear to auscultation. Respiratory effort normal.  Stable Cardiovascular system: S1 & S2 heard, RRR. No JVD, murmurs, rubs, gallops or clicks.  2+  pitting bilateral leg and arm edema.  Edema does not seem to have significantly improved in the last couple days. Gastrointestinal system: Abdomen is nondistended/obese, soft and nontender. No organomegaly or masses felt. Normal bowel sounds heard.  Stable. Central nervous system: Alert and oriented. No focal neurological deficits.  Stable. Extremities: Symmetric 5 x 5 power. Skin: No rashes, lesions or ulcers Psychiatry: Judgement and insight appear normal. Mood & affect appropriate.     Data Reviewed: I have personally reviewed following labs and imaging studies  CBC: Recent Labs  Lab 04/01/2018 1715 04/14/18 0941 04/16/18 0602  WBC 4.8 5.8 6.7  HGB 12.3* 10.3* 10.4*  HCT 34.5* 29.0* 29.4*  MCV 99.1 99.3 99.3  PLT 81* 81* 83*   Basic Metabolic Panel: Recent Labs  Lab 04/08/2018 1715 04/14/18 0941 04/15/18 0540 04/16/18 0602 04/17/18 0556  NA 130* 129* 129* 129* 130*  K 4.2 4.4 4.5 4.4 4.4  CL 100* 100* 99* 99* 100*  CO2 23 23 23 23 24   GLUCOSE 73 95 92 88 86  BUN 16 23* 26* 30* 33*  CREATININE 0.74 0.95 0.90 0.97 1.10  CALCIUM 7.9* 7.6* 7.8* 7.8* 7.8*   Liver Function Tests: Recent Labs  Lab 04/28/2018 1715 04/14/18 0941 04/15/18 0540 04/16/18 0602 04/17/18 0556  AST 1,171* 1,108* 1,326* 1,228* 1,176*  ALT 308* 303* 397* 397* 389*  ALKPHOS 119 108 115 156* 122  BILITOT 5.9* 5.5* 5.4* 5.3* 5.5*  PROT 5.9* 5.2* 5.2* 5.2* 5.0*  ALBUMIN 1.8* 1.6* 1.6* 1.6* 1.5*   Coagulation Profile: Recent Labs  Lab 04/12/2018 1900  INR 1.99     Recent Results (from the past 240 hour(s))  MRSA PCR Screening     Status: None   Collection Time: 04/14/18  3:19 PM  Result Value Ref Range Status   MRSA by PCR NEGATIVE NEGATIVE Final    Comment:        The GeneXpert MRSA Assay (FDA approved for NASAL specimens only), is one component of a comprehensive MRSA colonization surveillance program. It is not intended to diagnose MRSA infection nor to guide or monitor treatment  for MRSA infections. Performed at [facility], [place] [address] [place], [place] [address]          Radiology Studies: No results found.      Scheduled Meds: . chlorhexidine  15 mL Mouth Rinse BID  . ferrous sulfate  325 mg Oral Q breakfast  . furosemide  80 mg Intravenous BID  . lactulose  30 g Oral TID  . lisinopril  10 mg Oral Daily  . mouth rinse  15 mL Mouth Rinse q12n4p  . nystatin   Topical TID  . pantoprazole  80 mg Oral Daily  . penicillin v potassium  500 mg Oral BID  . rifaximin  550 mg Oral BID  . saccharomyces boulardii  250 mg Oral BID  . sertraline  50 mg Oral Daily   Continuous Infusions:   LOS: 1 day     Vernell Leep, MD, FACP, [facility]. [facility] Pager [phone]  If 7PM-7AM, please contact night-coverage [email or web address] Password [facility] 04/17/2018, 3:18 PM

## 2018-04-17 NOTE — Progress Notes (Signed)
No real improvement.  The patient's recorded weights are completely unreliable, ostensibly he has gained 25 pounds over the past 48 hours, while in the hospital on diuretics!  The patient, subjectively, does not feel less edematous.  Urine output is reasonable, but not copious.  On the other hand, his renal function tests do suggest some diuretic effect, with a mild increase in both BUN and creatinine.  Potassium remains normal.  Meanwhile, the patient's liver chemistries remain stabilized at a high level, which remains unexplained.  Exam: Patient lying in bed, asleep when I came in, no distress, but alert and coherent.  Still appears to have anasarca although it is difficult to differentiate this from his morbid obesity.  Impression:  1. NASH cirrhosis, with recent onset, persistent elevation of liver chemistries for unclear reasons  2.  Marked asthenia, reasons unclear, rarely able to take a few steps by his report.  Viral studies including both hepatitis and CMV negative.  Autoimmune markers for autoimmune hepatitis also negative, antimitochondrial studies pending  3.  Morbid obesity with significant fluid retention  Recommendations:  1.  This patient is in no position to go home; he can barely walk a few steps 2.  I think inpatient management is needed until we are clearly making progress in resolution of his extreme fluid retention.  I have discussed the case with his attending hospitalist physician, and we agree that it is probably time to transition him to IV Lasix 3.  If we do not see improvement in the patient's liver chemistries within the next several days, we may need to consider a transjugular liver biopsy, which would not be a trivial procedure in this morbidly obese patient.  In fact, it may not be possible, because he may not fit on the fluoroscopy table. 4.  I think the patient will need upper endoscopy and concurrent transesophageal echocardiography to check for, respectively,  varices/portal hypertension, and right heart failure as the cause of his edema.  This would presumably require at least a temporary transfer over to [facility], and presumably would occur next week.  We will follow the patient with you.  Please call us at any time to discuss his case.  Cleotis Nipper, M.D. Pager [phone] If no answer or after 5 PM call [phone]

## 2018-04-18 ENCOUNTER — Inpatient Hospital Stay (HOSPITAL_COMMUNITY): Payer: BLUE CROSS/BLUE SHIELD

## 2018-04-18 LAB — COMPREHENSIVE METABOLIC PANEL
ALK PHOS: 129 U/L — AB (ref 38–126)
ALT: 416 U/L — AB (ref 17–63)
AST: 1264 U/L — AB (ref 15–41)
Albumin: 1.5 g/dL — ABNORMAL LOW (ref 3.5–5.0)
Anion gap: 7 (ref 5–15)
BUN: 40 mg/dL — ABNORMAL HIGH (ref 6–20)
CALCIUM: 7.7 mg/dL — AB (ref 8.9–10.3)
CHLORIDE: 100 mmol/L — AB (ref 101–111)
CO2: 21 mmol/L — ABNORMAL LOW (ref 22–32)
CREATININE: 1.41 mg/dL — AB (ref 0.61–1.24)
GFR calc Af Amer: >60 mL/min (ref >60)
GFR, EST NON AFRICAN AMERICAN: 58 mL/min — AB (ref >60)
Glucose, Bld: 82 mg/dL (ref 65–99)
Potassium: 4.6 mmol/L (ref 3.5–5.1)
Sodium: 128 mmol/L — ABNORMAL LOW (ref 135–145)
Total Bilirubin: 6 mg/dL — ABNORMAL HIGH (ref 0.3–1.2)
Total Protein: 5.3 g/dL — ABNORMAL LOW (ref 6.5–8.1)

## 2018-04-18 LAB — ECHOCARDIOGRAM COMPLETE
Height: 76 in
WEIGHTICAEL: 8162 [oz_av]

## 2018-04-18 MED ORDER — PERFLUTREN LIPID MICROSPHERE
1.0000 mL | INTRAVENOUS | Status: AC | PRN
  Administered 2018-04-18: 2.5 mL via INTRAVENOUS
  Filled 2018-04-18: qty 10

## 2018-04-18 NOTE — Progress Notes (Signed)
*  Echocardiogram 2D Echocardiogram with definity has been performed.  Darlina Sicilian M 04/18/2018, 10:25 AM

## 2018-04-18 NOTE — Progress Notes (Addendum)
PROGRESS NOTE   Ryan Burgess  ZOX:[id number]    DOB: Jun 22, 1971    DOA: 04/24/2018  PCP: Esaw Grandchild, NP   I have briefly reviewed patients previous medical records in [facility].  Brief Narrative:  47 year old male with PMH of NASH cirrhosis complicated by hepatic encephalopathy & chronic thrombocytopenia, chronic lower extremity edema, morbid obesity, GERD, HLD, HTN, OSA on CPAP, prediabetes, treated 3 weeks PTA for periorbital shingles with Valtrex, presented to ED with generalized weakness, weight gain of approximately 30 pounds in 3 weeks and significantly worsened LFTs.  [facility] and [facility] consulted.  Anasarca not responding adequately to oral Lasix.  Switched to IV Lasix 5/17.  If no significant improvement clinically or LFTs, needs further evaluation early next week including EGD, TEE and possible transjugular liver biopsy.  Acute kidney injury noted 5/18, diuretics held.   Assessment & Plan:   Principal Problem:   Cirrhosis ([place]) Active Problems:   HTN (hypertension)   NASH (nonalcoholic steatohepatitis)   Acute hepatitis   NASH cirrhosis with worsening LFTs/acute hepatitis: [facility] consulted and continue to assist with management.  Acute hepatitis panel, ds DNA Ab, ANA, CMV IgM negative.  EBV IgM negative, IgG positive (>600).  Antimitochondrial antibodies, ASMA, RSV: Pending.  Abdominal ultrasound 5/14: No significant ascites.  Worsening LFTs may be secondary to recent herpes infection.  LFTs have slightly improved as below but still markedly abnormal.  I discussed with Dr. Cristina Gong, [facility] who indicates that if patient does not make significant improvement in the next couple days, will need further inpatient evaluation.Discontinued statins temporarily.  Lasix discontinued due to acute kidney injury.  Discussed with Dr. Oletta Lamas 5/18.  Anasarca: Secondary to liver disease.  Patient failed oral Lasix and was started briefly on IV Lasix.  Patient received a dose of IV  Lasix on 5/17.  Unable to clearly tell if patient is making any progress at all, intake output seems consistently inaccurate, so also weights (242 >249 >267 > 231).  Discussed with nursing in detail.  Peripherally has significant edema/third spacing.  Abdominal ultrasound did not show ascites.  Creatinine jumped from normal to 1.41 on 5/18.  IV Lasix discontinued.?  Other etiology for his anasarca.  TTE LVEF 81-19%, LV diastolic function cannot be assessed.  No proteinuria on urine microscopy.  History of hepatic encephalopathy: No clinical encephalopathy at this time.  Continue lactulose and Xifaxan.  Ammonia mildly elevated at 52.  Stable.  Morbid obesity/Body mass index is 62.09 kg/m.  As per Dr. Therisa Doyne, CCS consulted to see if any GI work-up is warranted while hospitalized which may be easier to do.  CCS recommends outpatient follow-up.  Hyponatremia: Stable.  Likely secondary to liver disease.  Macrocytic anemia: No bleeding reported.  Relatively stable.  Thrombocytopenia: Likely related to cirrhosis.  No bleeding reported.  Stable.  Essential hypertension: Controlled.  Discontinued lisinopril.  OSA: Continue nightly CPAP.  Acute kidney injury: Likely related to IV Lasix, discontinued.  Discontinue lisinopril (already received today's dose).  Follow BMP in a.m.    DVT prophylaxis: SCDs.  Coagulopathic/INR 1.99 Code Status: Full Family Communication: Discussed in detail with patient's spouse at bedside.  Updated care and answered questions. Disposition: Patient is unstable due to significantly abnormal LFTs from baseline that have not yet started to show consistent improvement, anasarca resistant to high-dose oral Lasix and transition to IV Lasix, may need further inpatient work-up if does not improve.  Remains inpatient.  Consultants:  [facility]  Procedures:  None  Antimicrobials:  None   Subjective: As he has stated in the last couple of days, no significant change in  urine output or extremity swelling.  Only -590 mL through current hospital stay.  ROS: As above  Objective:  Vitals:   04/17/18 2150 04/18/18 0500 04/18/18 0603 04/18/18 1400  BP:   (!) 126/42 (!) 116/30  Pulse:   78 77  Resp: 18  18 18   Temp:   97.7 F (36.5 C) 97.6 F (36.4 C)  TempSrc:   Oral Oral  SpO2:   92% 92%  Weight:  (!) 231.4 kg (510 lb 2 oz)    Height:        Examination: No significant change in exam compared to 5/17.  General exam: Pleasant young male, moderately built and morbidly obese, lying comfortably supine in bed. Respiratory system: Clear to auscultation. Respiratory effort normal.  Stable Cardiovascular system: S1 & S2 heard, RRR. No JVD, murmurs, rubs, gallops or clicks.  2+ pitting bilateral leg and arm edema.  Edema does not seem to have significantly improved in the last couple days. Gastrointestinal system: Abdomen is nondistended/obese, soft and nontender. No organomegaly or masses felt. Normal bowel sounds heard.  Stable. Central nervous system: Alert and oriented. No focal neurological deficits.  Stable. Extremities: Symmetric 5 x 5 power. Skin: No rashes, lesions or ulcers Psychiatry: Judgement and insight appear normal. Mood & affect appropriate.     Data Reviewed: I have personally reviewed following labs and imaging studies  CBC: Recent Labs  Lab 04/10/2018 1715 04/14/18 0941 04/16/18 0602  WBC 4.8 5.8 6.7  HGB 12.3* 10.3* 10.4*  HCT 34.5* 29.0* 29.4*  MCV 99.1 99.3 99.3  PLT 81* 81* 83*   Basic Metabolic Panel: Recent Labs  Lab 04/14/18 0941 04/15/18 0540 04/16/18 0602 04/17/18 0556 04/18/18 0513  NA 129* 129* 129* 130* 128*  K 4.4 4.5 4.4 4.4 4.6  CL 100* 99* 99* 100* 100*  CO2 23 23 23 24  21*  GLUCOSE 95 92 88 86 82  BUN 23* 26* 30* 33* 40*  CREATININE 0.95 0.90 0.97 1.10 1.41*  CALCIUM 7.6* 7.8* 7.8* 7.8* 7.7*   Liver Function Tests: Recent Labs  Lab 04/14/18 0941 04/15/18 0540 04/16/18 0602 04/17/18 0556  04/18/18 0513  AST 1,108* 1,326* 1,228* 1,176* 1,264*  ALT 303* 397* 397* 389* 416*  ALKPHOS 108 115 156* 122 129*  BILITOT 5.5* 5.4* 5.3* 5.5* 6.0*  PROT 5.2* 5.2* 5.2* 5.0* 5.3*  ALBUMIN 1.6* 1.6* 1.6* 1.5* 1.5*   Coagulation Profile: Recent Labs  Lab 04/02/2018 1900  INR 1.99     Recent Results (from the past 240 hour(s))  MRSA PCR Screening     Status: None   Collection Time: 04/14/18  3:19 PM  Result Value Ref Range Status   MRSA by PCR NEGATIVE NEGATIVE Final    Comment:        The GeneXpert MRSA Assay (FDA approved for NASAL specimens only), is one component of a comprehensive MRSA colonization surveillance program. It is not intended to diagnose MRSA infection nor to guide or monitor treatment for MRSA infections. Performed at [facility] [place] [address], [place], [place] [address]          Radiology Studies: No results found.      Scheduled Meds: . chlorhexidine  15 mL Mouth Rinse BID  . ferrous sulfate  325 mg Oral Q breakfast  . lactulose  30 g Oral TID  .  lisinopril  10 mg Oral Daily  . mouth rinse  15 mL Mouth Rinse q12n4p  . nystatin   Topical TID  . pantoprazole  80 mg Oral Daily  . penicillin v potassium  500 mg Oral BID  . rifaximin  550 mg Oral BID  . saccharomyces boulardii  250 mg Oral BID  . sertraline  50 mg Oral Daily   Continuous Infusions:   LOS: 2 days     Vernell Leep, MD, FACP, [facility]. [facility] Pager [phone]  If 7PM-7AM, please contact night-coverage [email or web address] Password [facility] 04/18/2018, 3:17 PM

## 2018-04-18 NOTE — Progress Notes (Signed)
[facility] PROGRESS NOTE Subjective Patient states that he feels better slightly today.  He just got through being weighed and he and the nurse both feel good about the weight and it appears that it is down to 510 pounds.  He feels that his abdomen is less swollen and his legs are slightly less swollen.  He still feels weak but may be a little bit better.  Objective: Vital signs in last 24 hours: Temp:  [97.7 F (36.5 C)-97.9 F (36.6 C)] 97.7 F (36.5 C) (05/18 0603) Pulse Rate:  [74-78] 78 (05/18 0603) Resp:  [14-18] 18 (05/18 0603) BP: (114-126)/(29-42) 126/42 (05/18 0603) SpO2:  [91 %-92 %] 92 % (05/18 0603) Weight:  [231.4 kg (510 lb 2 oz)] 231.4 kg (510 lb 2 oz) (05/18 0500) Last BM Date: 04/17/18  Intake/Output from previous day: 05/17 0701 - 05/18 0700 In: 880 [P.O.:880] Out: 495 [Urine:495] Intake/Output this shift: No intake/output data recorded.  PE: General--morbidly obese white male, weight 510 pounds Neuro--no encephalopathy alert Abdomen--morbidly obese  Lab Results: Recent Labs    04/16/18 0602  WBC 6.7  HGB 10.4*  HCT 29.4*  PLT 83*   BMET Recent Labs    04/16/18 0602 04/17/18 0556 04/18/18 0513  NA 129* 130* 128*  K 4.4 4.4 4.6  CL 99* 100* 100*  CO2 23 24 21*  CREATININE 0.97 1.10 1.41*   LFT Recent Labs    04/16/18 0602 04/17/18 0556 04/18/18 0513  PROT 5.2* 5.0* 5.3*  AST 1,228* 1,176* 1,264*  ALT 397* 389* 416*  ALKPHOS 156* 122 129*  BILITOT 5.3* 5.5* 6.0*   PT/INR No results for input(s): LABPROT, INR in the last 72 hours. PANCREAS No results for input(s): LIPASE in the last 72 hours.       Studies/Results: No results found.  Medications: I have reviewed the patient's current medications.  Assessment:   1.  Cirrhosis secondary to NASH.  Liver tests are elevated for unclear reasons.  Extensive work-up is been negative for viral hepatitis, autoimmune disease etc.  Patient denies any new medications prior  to onset of this illness about April 22.  His only new medication was Valtrex which he was receiving for shingles.  Liver test remain elevated not better or worse. 2.  Marked fluid retention.  He feels that he has lost some weight and his BUN and creatinine indicate that he may be getting rid of some of the fluid.  Ultrasound of the abdomen and all 4 quadrants was done to evaluate for ascites and there was none grossly detectable. 3.  Profound weakness.  This is been going on for several weeks.  He notes it may be just a bit better.  Suppose it is possible that he contacted some virus that is because the weakness as well as the elevated liver test but this is not readily demonstrable   Plan: Would continue to follow him as we are and follow his weights.  If he fails to improve over the next couple of days we could consider transjugular liver biopsy if he we will fit on the table.   Nancy Fetter 04/18/2018, 6:58 AM  This note was created using voice recognition software. Minor errors may Have occurred unintentionally.  Pager: [phone] If no answer or after hours call [phone]

## 2018-04-19 DIAGNOSIS — M6282 Rhabdomyolysis: Secondary | ICD-10-CM

## 2018-04-19 LAB — CBC
HEMATOCRIT: 31.1 % — AB (ref 39.0–52.0)
HEMOGLOBIN: 11 g/dL — AB (ref 13.0–17.0)
MCH: 35.4 pg — ABNORMAL HIGH (ref 26.0–34.0)
MCHC: 35.4 g/dL (ref 30.0–36.0)
MCV: 100 fL (ref 78.0–100.0)
Platelets: 93 10*3/uL — ABNORMAL LOW (ref 150–400)
RBC: 3.11 MIL/uL — ABNORMAL LOW (ref 4.22–5.81)
RDW: 19 % — ABNORMAL HIGH (ref 11.5–15.5)
WBC: 8 10*3/uL (ref 4.0–10.5)

## 2018-04-19 LAB — COMPREHENSIVE METABOLIC PANEL
ALBUMIN: 1.5 g/dL — AB (ref 3.5–5.0)
ALT: 465 U/L — ABNORMAL HIGH (ref 17–63)
ANION GAP: 8 (ref 5–15)
AST: 1319 U/L — AB (ref 15–41)
Alkaline Phosphatase: 132 U/L — ABNORMAL HIGH (ref 38–126)
BILIRUBIN TOTAL: 6.7 mg/dL — AB (ref 0.3–1.2)
BUN: 48 mg/dL — ABNORMAL HIGH (ref 6–20)
CHLORIDE: 98 mmol/L — AB (ref 101–111)
CO2: 22 mmol/L (ref 22–32)
Calcium: 7.9 mg/dL — ABNORMAL LOW (ref 8.9–10.3)
Creatinine, Ser: 2.05 mg/dL — ABNORMAL HIGH (ref 0.61–1.24)
GFR calc Af Amer: 43 mL/min — ABNORMAL LOW (ref >60)
GFR calc non Af Amer: 37 mL/min — ABNORMAL LOW (ref >60)
GLUCOSE: 63 mg/dL — AB (ref 65–99)
POTASSIUM: 5 mmol/L (ref 3.5–5.1)
SODIUM: 128 mmol/L — AB (ref 135–145)
Total Protein: 5.2 g/dL — ABNORMAL LOW (ref 6.5–8.1)

## 2018-04-19 LAB — CREATININE, URINE, RANDOM: Creatinine, Urine: 299.74 mg/dL

## 2018-04-19 LAB — SODIUM, URINE, RANDOM: Sodium, Ur: <10 mmol/L

## 2018-04-19 LAB — CK: Total CK: 18898 U/L — ABNORMAL HIGH (ref 49–397)

## 2018-04-19 LAB — GLUCOSE, CAPILLARY
GLUCOSE-CAPILLARY: 62 mg/dL — AB (ref 65–99)
Glucose-Capillary: 103 mg/dL — ABNORMAL HIGH (ref 65–99)

## 2018-04-19 LAB — TSH: TSH: 3.228 u[IU]/mL (ref 0.350–4.500)

## 2018-04-19 MED ORDER — ALBUMIN HUMAN 25 % IV SOLN
50.0000 g | Freq: Two times a day (BID) | INTRAVENOUS | Status: AC
  Administered 2018-04-20 – 2018-04-22 (×6): 50 g via INTRAVENOUS
  Filled 2018-04-19 (×8): qty 200

## 2018-04-19 MED ORDER — OCTREOTIDE ACETATE 100 MCG/ML IJ SOLN
200.0000 ug | Freq: Two times a day (BID) | INTRAMUSCULAR | Status: DC
  Administered 2018-04-20 – 2018-04-23 (×8): 200 ug via SUBCUTANEOUS
  Filled 2018-04-19 (×10): qty 2

## 2018-04-19 MED ORDER — SODIUM CHLORIDE 0.9 % IV SOLN
INTRAVENOUS | Status: DC
  Administered 2018-04-19: 16:00:00 via INTRAVENOUS

## 2018-04-19 MED ORDER — MIDODRINE HCL 5 MG PO TABS
10.0000 mg | ORAL_TABLET | Freq: Three times a day (TID) | ORAL | Status: DC
  Administered 2018-04-20 – 2018-04-21 (×5): 10 mg via ORAL
  Filled 2018-04-19 (×6): qty 2

## 2018-04-19 MED ORDER — STERILE WATER FOR INJECTION IV SOLN
INTRAVENOUS | Status: DC
  Administered 2018-04-19 – 2018-04-20 (×2): via INTRAVENOUS
  Filled 2018-04-19 (×2): qty 850

## 2018-04-19 NOTE — Consult Note (Signed)
Renal Service Consult Note [facility]  Ryan Burgess 04/19/2018 Ryan Burgess Requesting Physician:  Ryan Burgess  Reason for Consult:  AKI, cirrhosis HPI: The patient is a 47 y.o. year-old with hx of NASH/ cirrhosis/ portal HTN, thrombocytopenia hepatic enceph TIA HTN OSA morbid obese recurrent right leg cellulitis bilat lymphedema admitted on 04/14/18 with dx of worsening abdominal swelling and lower ext edema also was jaundiced and had nausea and decreased uop.  Workup showed ^LFT's and tbili, low alb 1.5 and patient was admitted for further evalutation.  GI saw and sent studies for acute decompensation of liver including viral hepatitis, autoimmune, etc which was negative. Was recently started on Valtrex.  Pt was admitted and started on po V lasix 80 bid for about 48 hrs then one dose IV lasix then creat bumped and lasix was stopped.  Asked to see for AKI.    UNa today is < 10 and Ucreat is high.  BP's were normal on admit and are a little lower now in the 169'C systolic.    CPK just returned today at 18,000.  The statin was dc'Burgess today for this reason.   Pt started on po lasix 80 bid on admission 5/14 then ^'Burgess to 120 po qd on 5/16 then changed to 80 mg IV bid on 5/17 but only got one dose then lasix dc'Burgess altogether on 5/17.    Did get lisinopril 10 mg qd daily from 5/14 through 5/18, dc'Burgess yest.   Other meds include: zoloft/ florastor/ rifaximin/ po PCN/ PPI/ lactulose / iron / lipitor dc'Burgess on 5/15. Has not rec'Burgess any iv contrast or nsaids.  BP's on admission were normal and have declined slightly from 135/ 45 >>> to 105/ 35 today.  abd US done here was poor study due to bowel gas and habitus.     ROS  denies CP  no joint pain   no HA  no blurry vision  no rash  no diarrhea  no nausea/ vomiting  no dysuria  no difficulty voiding  no change in urine color    Past Medical History  Past Medical History:  Diagnosis Date  . Cellulitis   . Cirrhosis,  nonalcoholic ([place])   . Dyspnea    with walking   . GERD (gastroesophageal reflux disease)   . Headache    "maybe monthly" (12/19/2016)  . Hepatic encephalopathy ([place]) ~ 2016 X 2   "first time they thought it was a TIA; dx'Burgess hepatic encephalopathy the 2nd time it happened"  . High cholesterol   . Hypertension   . Kidney stones 01/2015   UTI  . Migraine    "none in years; used to have them frequently" (12/19/2016)  . Morbid obesity with body mass index of 50.0-59.9 in adult [facility]) 01/25/2015  . OSA on CPAP   . Pre-diabetes   . Snoring   . Venous stasis    Past Surgical History  Past Surgical History:  Procedure Laterality Date  . ESOPHAGOGASTRODUODENOSCOPY (EGD) WITH PROPOFOL N/A 07/26/2015   Procedure: ESOPHAGOGASTRODUODENOSCOPY (EGD) WITH PROPOFOL;  Surgeon: Ryan Silence, Burgess;  Location: [facility];  Service: Endoscopy;  Laterality: N/A;  . TEE WITHOUT CARDIOVERSION N/A 04/03/2015   Procedure: TRANSESOPHAGEAL ECHOCARDIOGRAM (TEE);  Surgeon: Ryan Prows, Burgess;  Location: [facility];  Service: Cardiovascular;  Laterality: N/A;  . VASECTOMY    . WISDOM TOOTH EXTRACTION  1990   Family History  Family History  Problem Relation Age of Onset  . Leukemia Mother   .  Diabetes Father   . Heart disease Maternal Grandmother   . Heart disease Paternal Grandmother   . Dementia Paternal Grandmother   . Diabetes Paternal Grandmother   . Frontotemporal dementia Paternal Grandmother   . COPD Maternal Grandfather   . Heart disease Paternal Grandfather   . Diabetes Paternal Grandfather    Social History  reports that he has never smoked. He has never used smokeless tobacco. He reports that he drinks about 0.6 - 1.2 oz of alcohol per week. He reports that he does not use drugs. Allergies No Known Allergies Home medications Prior to Admission medications   Medication Sig Start Date End Date Taking? Authorizing Provider  aspirin EC 81 MG EC tablet Take 1 tablet (81 mg total) by mouth daily. 01/10/15   Yes Ryan Burgess  atorvastatin (LIPITOR) 20 MG tablet TAKE 1 TABLET BY MOUTH AT BEDTIME 02/26/18  Yes Danford, Katy Burgess, Burgess  DEXILANT 60 MG capsule TAKE 1 CAPSULE BY MOUTH DAILY. 03/12/18  Yes Ryan Burgess  ferrous sulfate 325 (65 FE) MG EC tablet Take 325 mg by mouth daily with breakfast.   Yes Provider, Historical, Burgess  furosemide (LASIX) 40 MG tablet Take 1 tablet (40 mg total) by mouth daily. Patient taking differently: Take 40 mg by mouth 2 (two) times daily.  04/06/18  Yes Ryan Burgess  lactulose, encephalopathy, (CHRONULAC) 10 GM/15ML SOLN Take 10 g by mouth 2 (two) times daily as needed (constipation). Take 45 mls by mouth 3 times a day 04/24/16  Yes Provider, Historical, Burgess  lisinopril (PRINIVIL,ZESTRIL) 10 MG tablet Take 10 mg by mouth daily.   Yes Provider, Historical, Burgess  penicillin v potassium (VEETID) 500 MG tablet Take 500 mg by mouth 2 (two) times daily.   Yes Provider, Historical, Burgess  rifaximin (XIFAXAN) 550 MG TABS tablet Take 1 tablet (550 mg total) by mouth 2 (two) times daily. 06/14/16  Yes Ryan Burgess  saccharomyces boulardii (FLORASTOR) 250 MG capsule Take 1 capsule (250 mg total) by mouth 2 (two) times daily. 01/27/17  Yes Ryan Burgess  sertraline (ZOLOFT) 50 MG tablet Take 1 tablet (50 mg total) by mouth daily. 01/13/18  Yes Ryan Burgess  blood glucose meter kit and supplies KIT Dispense based on patient and insurance preference. Use up to four times daily as directed. (FOR ICD-9 250.00, 250.01). 01/11/15   Ryan Burgess  ferrous sulfate 325 (65 FE) MG EC tablet Take 1 tablet (325 mg total) by mouth daily with breakfast. 02/12/18 03/14/18  Ryan Burgess  loratadine (CLARITIN) 10 MG tablet Take 10 mg by mouth daily as needed for allergies.    Provider, Historical, Burgess  Vitamin Burgess, Ergocalciferol, (DRISDOL) 50000 units CAPS capsule TAKE 1 CAPSULE BY MOUTH EVERY 7 DAYS. 01/12/18   Ryan Burgess   Liver Function Tests Recent Labs   Lab 04/17/18 0556 04/18/18 0513 04/19/18 0527  AST 1,176* 1,264* 1,319*  ALT 389* 416* 465*  ALKPHOS 122 129* 132*  BILITOT 5.5* 6.0* 6.7*  PROT 5.0* 5.3* 5.2*  ALBUMIN 1.5* 1.5* 1.5*   Recent Labs  Lab 04/14/2018 1715  LIPASE 41   CBC Recent Labs  Lab 04/14/18 0941 04/16/18 0602 04/19/18 0527  WBC 5.8 6.7 8.0  HGB 10.3* 10.4* 11.0*  HCT 29.0* 29.4* 31.1*  MCV 99.3 99.3 100.0  PLT 81* 83* 93*   Basic Metabolic Panel Recent Labs  Lab 05/01/2018 1715 04/14/18 0941 04/15/18 0540  04/16/18 0602 04/17/18 0556 04/18/18 0513 04/19/18 0527  NA 130* 129* 129* 129* 130* 128* 128*  Burgess 4.2 4.4 4.5 4.4 4.4 4.6 5.0  CL 100* 100* 99* 99* 100* 100* 98*  CO2 '23 23 23 23 24 '$ 21* 22  GLUCOSE 73 95 92 88 86 82 63*  BUN 16 23* 26* 30* 33* 40* 48*  CREATININE 0.74 0.95 0.90 0.97 1.10 1.41* 2.05*  CALCIUM 7.9* 7.6* 7.8* 7.8* 7.8* 7.7* 7.9*   Iron/TIBC/Ferritin/ %Sat    Component Value Date/Time   IRON 123 02/02/2018 1440   IRON 166 (H) 10/20/2017 1521   TIBC 195 (L) 02/02/2018 1440   TIBC 181 (L) 10/20/2017 1521   FERRITIN 157 02/02/2018 1440   FERRITIN 131 10/20/2017 1521   IRONPCTSAT 63 (H) 02/02/2018 1440   IRONPCTSAT 91 (H) 10/20/2017 1521    Vitals:   04/18/18 2142 04/19/18 0648 04/19/18 1330 04/19/18 1457  BP: (!) 124/38 (!) 104/29  (!) 105/34  Pulse: 81 79  73  Resp:    18  Temp:  97.6 F (36.4 C)  98.6 F (37 C)  TempSrc:  Oral    SpO2: 95% 95%  91%  Weight:   (!) 238.8 kg (526 lb 7.3 oz)   Height:       Exam Gen morbidly obese wm on cpap is groggy but arouses and interacts approp No rash, cyanosis or gangrene Sclera anicteric, throat clear  No jvd or bruits Chest clear bilat to bases RRR no MRG Abd massive obesity with 2+ side abd wall pitting edema bilat nontender GU normal male MS no joint effusions or deformity Ext 2-3+ diffuse LE > UE edema Neuro is alert, Ox 3, having some spont mild myoclonic head jerking    home meds:  -ecasa/ lipitor/  dexilant/ fe so4/ pcn vk/ florastor/ zoloft/ claritin  -lasix 40 qd/ lisinopril 10 qd  UA 5/13 > clear amber 0-5 rbc/wbc/ epi no bacteria no protein UNa < 10,  Ucreat 299.74 Na 128 Burgess 5.0  CO2 22 BUN 48 Cr 2.05  Alb 1.5  tbili 6.7  AST 1319 ALT 465 WBC 8k  Hb 11 plt 93 CT feb 2016 abd > "Cirrhotic liver morphology with portal hypertension, splenomegaly and multiple perisplenic varices and collaterals in the left abdomen" in addition to other findings. No kidney stones or obstruction.  Abd [place] 04/06/18 > Kidneys 11- 12 cm obscured by bowel gas and habitus I/O 2.0 L in and 3.2 L out since admit on 5/15    Impression: 1  Acute kidney injury - in setting of acute on chronic liver failure/ decompensated cirrhosis but also with ^^CPK and ACEi  Rx (dc'Burgess yest).  UA was negative on admission 5 days ago w/ small hemoglobin suggesting did not have myoglobinuria then.  Will repeat UA and send urine myoglobin, could have ATN from rhabdomyolysis/ acei/ low perfusion in cirrhotics.  Other main concern is hepatorenal syndrome which would fit clinical picture with low UNa in setting of AKI in a cirrhotic pt.  HRS usually has low urine output however and his UOP today is good which may argue against.  Since not easy to rule out either entity at this time for now will treat for both with (1) octreotide sq / po midodrine/ IV albumin for HRS, and (2) IVF"s if possibly ATN from rhabdo.  HRS holds a worse prognosis.  2  Cirrhosis (felt due to NASH) - hx portal HTN, hepatic enceph dating back about 3-5 yrs by imaging  and prior admits 3  Morbid obesity 4  HTN - taking lisinopril only at home, on hold here 5  Hx recurrent cellulitis LE   Plan - will follow  Kelly Splinter Burgess [place] pager [phone]   04/19/2018, 5:28 PM

## 2018-04-19 NOTE — Progress Notes (Addendum)
PROGRESS NOTE   Ryan Burgess  WCB:[id number]    DOB: 06/14/1971    DOA: 04/21/2018  PCP: Esaw Grandchild, NP   I have briefly reviewed patients previous medical records in [facility].  Brief Narrative:  47 year old male with PMH of NASH cirrhosis complicated by hepatic encephalopathy & chronic thrombocytopenia, chronic lower extremity edema, morbid obesity, GERD, HLD, HTN, OSA on CPAP, prediabetes, treated 3 weeks PTA for periorbital shingles with Valtrex, presented to ED with generalized weakness, weight gain of approximately 30 pounds in 3 weeks and significantly worsened LFTs.  [facility] and [facility] consulted.  Anasarca not responding adequately to oral Lasix.  Switched to IV Lasix 5/17.  If no significant improvement clinically or LFTs, needs further evaluation early next week including EGD, TEE and possible transjugular liver biopsy.  Acute kidney injury noted 5/18, diuretics held.   Assessment & Plan:   Principal Problem:   Cirrhosis ([place]) Active Problems:   HTN (hypertension)   NASH (nonalcoholic steatohepatitis)   Acute hepatitis   NASH cirrhosis with worsening LFTs/acute hepatitis: [facility] consulted and continue to assist with management.  Acute hepatitis panel, ds DNA Ab, ANA, CMV IgM negative.  EBV IgM negative, IgG positive (>600).  Antimitochondrial antibodies, ASMA, RSV: Pending.  Abdominal ultrasound 5/14: No significant ascites.  Worsening LFTs may be secondary to recent herpes infection.  LFTs have slightly improved as below but still markedly abnormal.  I discussed with Dr. Cristina Gong, [facility] who indicates that if patient does not make significant improvement in the next couple days, will need further inpatient evaluation.Discontinued statins temporarily.  Lasix discontinued on 5/18 due to acute kidney injury.  Discussed with Dr. Oletta Lamas 5/18.  Progressive generalized weakness, difficulty getting out of bed.  GI requested CK which is markedly elevated at 18,898 on 5/19  which could explain some of his abnormal LFTs.  Statins already discontinued.  Despite significant third spacing, will start on gentle normal saline hydration at 50 cc an hour pending nephrology input to see if this will help his possible rhabdomyolysis and acute kidney injury. ? IV Albumin.  Possible rhabdomyolysis: CK 5/19: 18,898.  As per GI, Valtrex could cause rhabdomyolysis.  Brief IV fluids and follow daily CK.  Anasarca: Secondary to liver disease.  Patient failed oral Lasix and was started briefly on IV Lasix.  Patient received a dose of IV Lasix on 5/17.  Unable to clearly tell if patient is making any progress at all, intake output seems consistently inaccurate, so also weights (242 >249 >267 > 231).  Discussed with nursing in detail.  Peripherally has significant edema/third spacing.  Abdominal ultrasound did not show ascites.  Creatinine jumped from normal to 1.41 on 5/18 >2.05 on 5/19.  IV Lasix discontinued.?  Other etiology for his anasarca.  TTE LVEF 61-60%, LV diastolic function cannot be assessed.  No proteinuria on urine microscopy.  Nephrology consulted for assistance 5/19.  History of hepatic encephalopathy: No clinical encephalopathy at this time.  Continue lactulose and Xifaxan.  Ammonia mildly elevated at 52.  Stable.  Morbid obesity/Body mass index is 62.09 kg/m.  As per Dr. Therisa Doyne, [facility]CS consulted to see if any GI work-up is warranted while hospitalized which may be easier to do.  CCS recommends outpatient follow-up.  Hyponatremia: Stable.  Likely secondary to liver disease.  Macrocytic anemia: No bleeding reported.  Relatively stable.  Thrombocytopenia: Likely related to cirrhosis.  No bleeding reported.  Stable.  Essential hypertension: Controlled.  Discontinued lisinopril.  OSA: Continue  nightly CPAP.  Acute kidney injury: Likely related to IV Lasix, discontinued.  Discontinue lisinopril (already received today's dose).  Worsening creatinine.  Nephrology consulted on  5/19.  Severe hypoalbuminemia: Likely due to cirrhosis and nutritional.  RD consulted.  Hypoglycemia: Low CBG 63 on BMP. Monitor CBG.    DVT prophylaxis: SCDs.  Coagulopathic/INR 1.99 Code Status: Full Family Communication: Discussed in detail with patient's spouse at bedside.  Updated care and answered questions. Disposition: Not medically stable for discharge.  To be determined.  Consultants:  [facility] Nephrology.  Procedures:  None  Antimicrobials:  None   Subjective: Generally weak, difficulty getting out of bed otherwise reports no significant change for several days now.  Persistent generalized body swelling.  No pain reported.  ROS: As above  Objective:  Vitals:   04/18/18 1400 04/18/18 2034 04/18/18 2142 04/19/18 0648  BP: (!) 116/30  (!) 124/38 (!) 104/29  Pulse: 77  81 79  Resp: 18 18    Temp: 97.6 F (36.4 C)   97.6 F (36.4 C)  TempSrc: Oral   Oral  SpO2: 92%  95% 95%  Weight:      Height:        Examination: No significant change in exam over the last 2 days.  General exam: Pleasant young male, moderately built and morbidly obese, lying comfortably supine in bed. Respiratory system: Clear to auscultation. Respiratory effort normal.  Stable Cardiovascular system: S1 & S2 heard, RRR. No JVD, murmurs, rubs, gallops or clicks.  2+ pitting bilateral leg and arm edema.  Edema does not seem to have significantly improved in the last couple days. Gastrointestinal system: Abdomen is nondistended/obese, soft and nontender. No organomegaly or masses felt. Normal bowel sounds heard.  Stable. Central nervous system: Alert and oriented. No focal neurological deficits.  Stable. Extremities: Symmetric 5 x 5 power. Skin: No rashes, lesions or ulcers Psychiatry: Judgement and insight appear normal. Mood & affect appropriate.     Data Reviewed: I have personally reviewed following labs and imaging studies  CBC: Recent Labs  Lab 04/21/2018 1715  04/14/18 0941 04/16/18 0602 04/19/18 0527  WBC 4.8 5.8 6.7 8.0  HGB 12.3* 10.3* 10.4* 11.0*  HCT 34.5* 29.0* 29.4* 31.1*  MCV 99.1 99.3 99.3 100.0  PLT 81* 81* 83* 93*   Basic Metabolic Panel: Recent Labs  Lab 04/15/18 0540 04/16/18 0602 04/17/18 0556 04/18/18 0513 04/19/18 0527  NA 129* 129* 130* 128* 128*  K 4.5 4.4 4.4 4.6 5.0  CL 99* 99* 100* 100* 98*  CO2 23 23 24  21* 22  GLUCOSE 92 88 86 82 63*  BUN 26* 30* 33* 40* 48*  CREATININE 0.90 0.97 1.10 1.41* 2.05*  CALCIUM 7.8* 7.8* 7.8* 7.7* 7.9*   Liver Function Tests: Recent Labs  Lab 04/15/18 0540 04/16/18 0602 04/17/18 0556 04/18/18 0513 04/19/18 0527  AST 1,326* 1,228* 1,176* 1,264* 1,319*  ALT 397* 397* 389* 416* 465*  ALKPHOS 115 156* 122 129* 132*  BILITOT 5.4* 5.3* 5.5* 6.0* 6.7*  PROT 5.2* 5.2* 5.0* 5.3* 5.2*  ALBUMIN 1.6* 1.6* 1.5* 1.5* 1.5*   Coagulation Profile: Recent Labs  Lab 04/03/2018 1900  INR 1.99     Recent Results (from the past 240 hour(s))  MRSA PCR Screening     Status: None   Collection Time: 04/14/18  3:19 PM  Result Value Ref Range Status   MRSA by PCR NEGATIVE NEGATIVE Final    Comment:        The GeneXpert MRSA  Assay (FDA approved for NASAL specimens only), is one component of a comprehensive MRSA colonization surveillance program. It is not intended to diagnose MRSA infection nor to guide or monitor treatment for MRSA infections. Performed at [facility], [place] [address], [place], [place] [address]          Radiology Studies: No results found.      Scheduled Meds: . chlorhexidine  15 mL Mouth Rinse BID  . ferrous sulfate  325 mg Oral Q breakfast  . lactulose  30 g Oral TID  . mouth rinse  15 mL Mouth Rinse q12n4p  . nystatin   Topical TID  . pantoprazole  80 mg Oral Daily  . penicillin v potassium  500 mg Oral BID  . rifaximin  550 mg Oral BID  . saccharomyces boulardii  250 mg Oral BID  . sertraline  50 mg Oral Daily    Continuous Infusions:   LOS: 3 days     Vernell Leep, MD, FACP, [facility]. [facility] Pager [phone]  If 7PM-7AM, please contact night-coverage [email or web address] Password [email or web address] 04/19/2018, 2:52 PM

## 2018-04-19 NOTE — Progress Notes (Signed)
[facility] PROGRESS NOTE Subjective Patient does not really feel any better still profoundly weak.  Review of the records and discussion with him indicates that he was treated about 3 weeks ago for shingles with Valtrex and after several days on Valtrex began to become severely weak.  He is not yet been weighed.  He feels so profoundly weak he is unable to get out of bed.  We had increased his Lasix due to retained fluids in his legs and he lost weight but unfortunately his creatinine has drifted up this morning.  Cardiac echo was performed results pending  Objective: Vital signs in last 24 hours: Temp:  [97.6 F (36.4 C)] 97.6 F (36.4 C) (05/19 0648) Pulse Rate:  [77-81] 79 (05/19 0648) Resp:  [18] 18 (05/18 2034) BP: (104-124)/(29-38) 104/29 (05/19 0648) SpO2:  [92 %-95 %] 95 % (05/19 0648) Last BM Date: 04/18/18  Intake/Output from previous day: No intake/output data recorded. Intake/Output this shift: No intake/output data recorded.  PE: General--patient laying in bed with CPAP.  Lungs--clear Abdomen--quite obese Neuro--alert with no signs of encephalopathy.  Patient barely able to raise both legs off the bed he is able to raise his arms.  Lab Results: Recent Labs    04/19/18 0527  WBC 8.0  HGB 11.0*  HCT 31.1*  PLT 93*   BMET Recent Labs    04/17/18 0556 04/18/18 0513 04/19/18 0527  NA 130* 128* 128*  K 4.4 4.6 5.0  CL 100* 100* 98*  CO2 24 21* 22  CREATININE 1.10 1.41* 2.05*   LFT Recent Labs    04/17/18 0556 04/18/18 0513 04/19/18 0527  PROT 5.0* 5.3* 5.2*  AST 1,176* 1,264* 1,319*  ALT 389* 416* 465*  ALKPHOS 122 129* 132*  BILITOT 5.5* 6.0* 6.7*   PT/INR No results for input(s): LABPROT, INR in the last 72 hours. PANCREAS No results for input(s): LIPASE in the last 72 hours.       Studies/Results: No results found.  Medications: I have reviewed the patient's current medications.  Assessment:   1.  Abnormal LFTs.   Patient is known to have cirrhosis secondary to NASH.  His progressive rise in LFTs coincide with his acute illness.  He may well need liver biopsy. out of all the liver test obtained, the only positive test is RSV which is not known to cause liver disease.  All other test have so far been negative. 2.  Progressive azotemia.  This is consistent with the ultrasound finding that he did not really have any ascites.  His fluid retention was probably anasarca that his improved with more aggressive Lasix therapy but unfortunately he is now becoming somewhat dehydrated and may need IV fluids. 3.  Profound weakness.  This is his primary complaint he is unable to even lift himself out of the bed.  In spite of the elevated bilirubin, it is possible that he could have rhabdomyolysis or some reaction to Valtrex ordered herpes simplex virus causing shingles.  This has been reported to cause rhabdomyolysis.   Plan: 1.  We will obtain serum CPK to look for any signs of rhabdomyolysis contributing to his elevated ALT and AST.  AST is much more elevated than ALT.  He reports that his alcohol use is minimal a couple times a year 2.  If LFTs still elevated tomorrow I would suggest that interventional radiology evaluate him for possible transjugular liver biopsy.   Nancy Fetter 04/19/2018, 7:21 AM  This note was created using voice  recognition software. Minor errors may Have occurred unintentionally.  Pager: [phone] If no answer or after hours call [phone]

## 2018-04-20 ENCOUNTER — Inpatient Hospital Stay (HOSPITAL_COMMUNITY): Payer: BLUE CROSS/BLUE SHIELD

## 2018-04-20 ENCOUNTER — Other Ambulatory Visit: Payer: Self-pay

## 2018-04-20 ENCOUNTER — Ambulatory Visit: Payer: BLUE CROSS/BLUE SHIELD | Admitting: Occupational Therapy

## 2018-04-20 DIAGNOSIS — E875 Hyperkalemia: Secondary | ICD-10-CM

## 2018-04-20 LAB — COMPREHENSIVE METABOLIC PANEL
ALK PHOS: 128 U/L — AB (ref 38–126)
ALT: 654 U/L — ABNORMAL HIGH (ref 17–63)
ANION GAP: 9 (ref 5–15)
AST: 1619 U/L — ABNORMAL HIGH (ref 15–41)
Albumin: 1.8 g/dL — ABNORMAL LOW (ref 3.5–5.0)
BUN: 57 mg/dL — ABNORMAL HIGH (ref 6–20)
CALCIUM: 8.1 mg/dL — AB (ref 8.9–10.3)
CO2: 22 mmol/L (ref 22–32)
CREATININE: 3.31 mg/dL — AB (ref 0.61–1.24)
Chloride: 98 mmol/L — ABNORMAL LOW (ref 101–111)
GFR, EST AFRICAN AMERICAN: 24 mL/min — AB (ref >60)
GFR, EST NON AFRICAN AMERICAN: 21 mL/min — AB (ref >60)
Glucose, Bld: 90 mg/dL (ref 65–99)
Potassium: 5.7 mmol/L — ABNORMAL HIGH (ref 3.5–5.1)
SODIUM: 129 mmol/L — AB (ref 135–145)
TOTAL PROTEIN: 5.2 g/dL — AB (ref 6.5–8.1)
Total Bilirubin: 9.1 mg/dL — ABNORMAL HIGH (ref 0.3–1.2)

## 2018-04-20 LAB — URINALYSIS, ROUTINE W REFLEX MICROSCOPIC
Bilirubin Urine: NEGATIVE
GLUCOSE, UA: NEGATIVE mg/dL
Ketones, ur: NEGATIVE mg/dL
Leukocytes, UA: NEGATIVE
NITRITE: NEGATIVE
PH: 5 (ref 5.0–8.0)
Protein, ur: NEGATIVE mg/dL
SPECIFIC GRAVITY, URINE: 1.016 (ref 1.005–1.030)

## 2018-04-20 LAB — BASIC METABOLIC PANEL
ANION GAP: 10 (ref 5–15)
BUN: 58 mg/dL — ABNORMAL HIGH (ref 6–20)
CHLORIDE: 96 mmol/L — AB (ref 101–111)
CO2: 21 mmol/L — AB (ref 22–32)
CREATININE: 3.78 mg/dL — AB (ref 0.61–1.24)
Calcium: 8.1 mg/dL — ABNORMAL LOW (ref 8.9–10.3)
GFR calc non Af Amer: 18 mL/min — ABNORMAL LOW (ref >60)
GFR, EST AFRICAN AMERICAN: 21 mL/min — AB (ref >60)
Glucose, Bld: 119 mg/dL — ABNORMAL HIGH (ref 65–99)
Potassium: 5.6 mmol/L — ABNORMAL HIGH (ref 3.5–5.1)
SODIUM: 127 mmol/L — AB (ref 135–145)

## 2018-04-20 LAB — CK: CK TOTAL: 12136 U/L — AB (ref 49–397)

## 2018-04-20 LAB — GLUCOSE, CAPILLARY
Glucose-Capillary: 72 mg/dL (ref 65–99)
Glucose-Capillary: 122 mg/dL — ABNORMAL HIGH (ref 65–99)
Glucose-Capillary: 108 mg/dL — ABNORMAL HIGH (ref 65–99)
Glucose-Capillary: 102 mg/dL — ABNORMAL HIGH (ref 65–99)

## 2018-04-20 MED ORDER — PENICILLIN V POTASSIUM 250 MG PO TABS
500.0000 mg | ORAL_TABLET | Freq: Two times a day (BID) | ORAL | Status: DC
Start: 2018-04-20 — End: 2018-04-23
  Administered 2018-04-20 – 2018-04-22 (×5): 500 mg via ORAL
  Filled 2018-04-20 (×6): qty 2

## 2018-04-20 MED ORDER — FUROSEMIDE 10 MG/ML IJ SOLN
80.0000 mg | Freq: Three times a day (TID) | INTRAMUSCULAR | Status: DC
  Administered 2018-04-20 – 2018-04-23 (×8): 80 mg via INTRAVENOUS
  Filled 2018-04-20 (×9): qty 8

## 2018-04-20 MED ORDER — SODIUM POLYSTYRENE SULFONATE 15 GM/60ML PO SUSP
30.0000 g | Freq: Once | ORAL | Status: AC
  Administered 2018-04-20: 30 g via ORAL
  Filled 2018-04-20: qty 120

## 2018-04-20 NOTE — Progress Notes (Signed)
Night shift RN reported that patient had no urine output during the 12 hour shift. Bladder scan performed with 0 ml.

## 2018-04-20 NOTE — Progress Notes (Signed)
PROGRESS NOTE   Ryan Burgess  ZWC:[id number]    DOB: 10/06/1971    DOA: 04/18/2018  PCP: Esaw Grandchild, NP   I have briefly reviewed patients previous medical records in [facility].  Brief Narrative:  47 year old male with PMH of NASH cirrhosis complicated by hepatic encephalopathy & chronic thrombocytopenia, chronic lower extremity edema, morbid obesity, GERD, HLD, HTN, OSA on CPAP, prediabetes, treated 3 weeks PTA for periorbital shingles with Valtrex, presented to ED with generalized weakness, weight gain of approximately 30 pounds in 3 weeks and significantly worsened LFTs.  Monitoring daily LFTs, attempted diuresis with initial oral Lasix followed by a dose of IV Lasix then developed Acute kidney injury noted 5/18 secondary to ATN from rhabdomyolysis versus hepatorenal syndrome.  Renal functions worsening.   Assessment & Plan:   Principal Problem:   Cirrhosis ([place]) Active Problems:   HTN (hypertension)   NASH (nonalcoholic steatohepatitis)   Acute hepatitis   NASH cirrhosis with worsening LFTs/acute hepatitis: [facility] consulted and continue to assist with management.  Acute hepatitis panel, ds DNA Ab, ANA, CMV IgM, ASMA, antimitochondrial antibodies negative.  EBV IgM negative, IgG positive (>600).  RSV: Positive.  Abdominal ultrasound 5/14: No significant ascites.  Since admission, LFTs were monitored daily without improvement and now worse.  Anasarca was treated with increasing doses of oral Lasix followed by brief IV Lasix without much improvement, diuretics then discontinued due to acute kidney injury.  CK then noted to be markedly elevated at ~19K on 5/19.  LFT worsening may be related to rhabdomyolysis.  Initially felt to be due to recent herpes infection.  Discussed with Dr. Michail Sermon, [facility], no liver biopsy because not a candidate for liver transplant and abnormal LFTs may be explained by rhabdomyolysis.   Rhabdomyolysis: CK 5/19: 18,898.  Unclear etiology.?   Sedentary/discontinued statins recently/Valtrex.  No history of falls.  As per GI, Valtrex could cause rhabdomyolysis.  CK better today at 12 K.  On bicarbonate drip.  Continue to follow.  Anasarca: Secondary to liver disease.  Patient failed oral Lasix and was started briefly on IV Lasix.  Patient received a dose of IV Lasix on 5/17.  Peripherally has significant edema/third spacing.  Abdominal ultrasound did not show ascites.  Creatinine jumped from normal to 1.41 on 5/18 >2.05 on 5/19.  IV Lasix discontinued.?  Other etiology for his anasarca.  TTE LVEF 23-53%, LV diastolic function cannot be assessed.  No proteinuria on urine microscopy.  Remains significantly volume overloaded.  Unfortunately now has acute renal failure.  Acute kidney injury: DD: ATN from acute rhabdomyolysis and ACEI versus hepatorenal syndrome (carries poor prognosis).  Nephrology was consulted and treating for both with subcutaneous octreotide, oral midodrine, IV albumin for HRS and IV fluids for rhabdomyolysis.  No oliguric overnight 5/19.  Hyperkalemic.  Worsening renal function.  Discussed with nephrology and await input.  Hyperkalemia: Secondary to worsening acute kidney injury.  Changed to renal diet.  No EKG changes.  Kayexalate provided.  Follow BMP later this evening and daily.  History of hepatic encephalopathy: No clinical encephalopathy at this time.  Continue lactulose and Xifaxan.  Ammonia mildly elevated at 52.  Stable.  Morbid obesity/Body mass index is 64.08 kg/m.  CCS recommends outpatient follow-up.  Hyponatremia: Stable.  Likely secondary to liver disease.  Macrocytic anemia: No bleeding reported.  Relatively stable.  Thrombocytopenia: Likely related to cirrhosis.  No bleeding reported.  Stable.  Essential hypertension: Controlled.  Discontinued lisinopril.  OSA: Continue nightly CPAP.  Severe hypoalbuminemia: Likely due to cirrhosis and nutritional.  RD consulted.  Hypoglycemia: Low CBG 63 on BMP.  Monitor CBG.    DVT prophylaxis: SCDs.  Coagulopathic/INR 1.99 Code Status: Full Family Communication: Discussed in detail with patient's spouse.  Updated care and critical nature of patient's illness. Disposition: Not medically stable for discharge.  To be determined.  Consultants:  [facility] Nephrology.  Procedures:  None  Antimicrobials:  None   Subjective: Feels about the same.  No new complaints.  Generalized body swelling.  No dyspnea.  As per nursing, no urine output overnight and bladder scan negative.  ROS: As above  Objective:  Vitals:   04/19/18 1330 04/19/18 1457 04/19/18 2052 04/20/18 0621  BP:  (!) 105/34 (!) 106/31 (!) 119/41  Pulse:  73 73 81  Resp:  18  18  Temp:  98.6 F (37 C) (!) 97.5 F (36.4 C) 97.7 F (36.5 C)  TempSrc:   Oral Oral  SpO2:  91% 92% 93%  Weight: (!) 238.8 kg (526 lb 7.3 oz)     Height:        Examination:   General exam: Pleasant young male, moderately built and morbidly obese, lying comfortably supine in bed.  Does not appear in any distress. Respiratory system: Clear to auscultation.  No increased work of breathing. Cardiovascular system: S1 & S2 heard, RRR. No JVD, murmurs, rubs, gallops or clicks.  2+ pitting bilateral leg and upper extremity edema. Gastrointestinal system: Abdomen is nondistended/obese, soft and nontender. No organomegaly or masses felt. Normal bowel sounds heard.  Stable without change. Central nervous system: Alert and oriented. No focal neurological deficits.  Stable without change. Extremities: Symmetric 5 x 5 power. Skin: No rashes, lesions or ulcers Psychiatry: Judgement and insight appear normal. Mood & affect appropriate.     Data Reviewed: I have personally reviewed following labs and imaging studies  CBC: Recent Labs  Lab 04/28/2018 1715 04/14/18 0941 04/16/18 0602 04/19/18 0527  WBC 4.8 5.8 6.7 8.0  HGB 12.3* 10.3* 10.4* 11.0*  HCT 34.5* 29.0* 29.4* 31.1*  MCV 99.1 99.3 99.3  100.0  PLT 81* 81* 83* 93*   Basic Metabolic Panel: Recent Labs  Lab 04/16/18 0602 04/17/18 0556 04/18/18 0513 04/19/18 0527 04/20/18 0859  NA 129* 130* 128* 128* 129*  K 4.4 4.4 4.6 5.0 5.7*  CL 99* 100* 100* 98* 98*  CO2 23 24 21* 22 22  GLUCOSE 88 86 82 63* 90  BUN 30* 33* 40* 48* 57*  CREATININE 0.97 1.10 1.41* 2.05* 3.31*  CALCIUM 7.8* 7.8* 7.7* 7.9* 8.1*   Liver Function Tests: Recent Labs  Lab 04/16/18 0602 04/17/18 0556 04/18/18 0513 04/19/18 0527 04/20/18 0859  AST 1,228* 1,176* 1,264* 1,319* 1,619*  ALT 397* 389* 416* 465* 654*  ALKPHOS 156* 122 129* 132* 128*  BILITOT 5.3* 5.5* 6.0* 6.7* 9.1*  PROT 5.2* 5.0* 5.3* 5.2* 5.2*  ALBUMIN 1.6* 1.5* 1.5* 1.5* 1.8*   Coagulation Profile: Recent Labs  Lab 04/12/2018 1900  INR 1.99     Recent Results (from the past 240 hour(s))  MRSA PCR Screening     Status: None   Collection Time: 04/14/18  3:19 PM  Result Value Ref Range Status   MRSA by PCR NEGATIVE NEGATIVE Final    Comment:        The GeneXpert MRSA Assay (FDA approved for NASAL specimens only), is one component of a comprehensive MRSA colonization surveillance program. It is not intended to diagnose MRSA infection  nor to guide or monitor treatment for MRSA infections. Performed at [facility], [place] [address] [place], [place] [address]          Radiology Studies: No results found.      Scheduled Meds: . chlorhexidine  15 mL Mouth Rinse BID  . ferrous sulfate  325 mg Oral Q breakfast  . lactulose  30 g Oral TID  . mouth rinse  15 mL Mouth Rinse q12n4p  . midodrine  10 mg Oral TID WC  . nystatin   Topical TID  . octreotide  200 mcg Subcutaneous Q12H  . pantoprazole  80 mg Oral Daily  . penicillin v potassium  500 mg Oral BID  . rifaximin  550 mg Oral BID  . saccharomyces boulardii  250 mg Oral BID  . sertraline  50 mg Oral Daily   Continuous Infusions: . albumin human    .  sodium bicarbonate (isotonic)  infusion in sterile water 65 mL/hr at 04/20/18 1354     LOS: 4 days     Vernell Leep, MD, FACP, [facility]. [facility] Pager [phone]  If 7PM-7AM, please contact night-coverage [email or web address] Password [email or web address] 04/20/2018, 3:01 PM

## 2018-04-20 NOTE — Progress Notes (Signed)
[facility] Progress Note  Ryan Burgess 47 y.o. [birth year]   Subjective: Resting in bed. Denies N/V/abdominal pain.  Objective: Vital signs: Vitals:   04/19/18 2052 04/20/18 0621  BP: (!) 106/31 (!) 119/41  Pulse: 73 81  Resp:  18  Temp: (!) 97.5 F (36.4 C) 97.7 F (36.5 C)  SpO2: 92% 93%    Physical Exam: Gen: alert, no acute distress, morbidly obese CV: RRR Chest: CTA B Abd: soft, nontender, nondistended, +BS, obese   Lab Results: Recent Labs    04/18/18 0513 04/19/18 0527  NA 128* 128*  K 4.6 5.0  CL 100* 98*  CO2 21* 22  GLUCOSE 82 63*  BUN 40* 48*  CREATININE 1.41* 2.05*  CALCIUM 7.7* 7.9*   Recent Labs    04/18/18 0513 04/19/18 0527  AST 1,264* 1,319*  ALT 416* 465*  ALKPHOS 129* 132*  BILITOT 6.0* 6.7*  PROT 5.3* 5.2*  ALBUMIN 1.5* 1.5*   Recent Labs    04/19/18 0527  WBC 8.0  HGB 11.0*  HCT 31.1*  MCV 100.0  PLT 93*      Assessment/Plan: Rhabdomyolysis with CK 18,898 which I think is the source of his elevated transaminases. He has NASH cirrhosis. Would NOT recommend a liver biopsy for two reasons: 1. I think his markedly elevated CK is the source of his elevated LFTs and 2. Liver biopsy will not change my current management. He is not a liver transplant candidate due to his morbid obesity and his renal insufficiency. His lact or urine output overnight is concerning for hepatorenal syndrome. Lab at bedside trying to get AM labs. Appreciate Dr. Jonnie Finner help. Will follow.     [name] 04/20/2018, 8:53 AM  Questions please call [phone] ID: Ryan Burgess, male   DOB: March 29, 1971, 47 y.o.   MRN: [id number]

## 2018-04-20 NOTE — Progress Notes (Signed)
Addendum  Dr. Carolin Sicks personally discussed with me after seeing patient.  His recommendations are as follows:  Acute kidney injury likely due to ATN from rhabdomyolysis versus hepatorenal syndrome.  1. DC bicarbonate drip. 2. Start IV Lasix 80 mg IV every 8 hourly. 3. Transfer patient to [facility] stepdown and nephrology will continue to closely follow.  If renal functions continue to deteriorate, may need temporary HD but not candidate for long-term HD.  I requested that he update patient's wife.  Patient will be going to [facility] stepdown under [facility] team 12.  Alerted team MD and placement RN.  Vernell Leep, MD, FACP, [facility]. [facility] Pager [phone]  If 7PM-7AM, please contact night-coverage [email or web address] Password [facility] 04/20/2018, 4:24 PM

## 2018-04-20 NOTE — Progress Notes (Addendum)
[facility] NEPHROLOGY PROGRESS NOTE  Assessment/ Plan: Pt is a 47 y.o. yo male  Morbidly obese, NASH liver cirrhosis, obligated by hepatic encephalopathy, chronic thrombocytopenia, chronic lower extremities edema, history of hypertension, OSA on CPAP who was treated with Valtrex for peri-orbital shingles 3 weeks prior to admission, presented with weight gain of about 30 pounds in 3 weeks associated with generalized weakness and worsening liver function.  Patient was found to have rhabdomyolysis and worsening renal failure.  Assessment/Plan:  #Acute kidney injury likely multifactorial including ATN due to rhabdomyolysis, lisinopril use, poor renal perfusion in cirrhotic patient.  Other concern is hepatorenal syndrome given low urine sodium in cirrhotic patient.  Patient was treated with octreotide, midodrine and albumin with no improvement.  Patient is now anuric associated with increase in serum creatinine level and hyperkalemia.  He has anasarca therefore I am discontinuing IV fluid and  started on IV Lasix 80 mg every 8 hourly.  Continue rest of the treatment for HRS including octreotide, albumin and midodrine.  CK level is trending down.  Continue to hold statin, lisinopril.  Monitor renal function, urine output closely.  I will check renal ultrasound.  The kidneys looked acceptable in US abdomen done in 04/06/18.  Recommend to transfer patient to [facility] especially since he may require dialysis if no improvement in kidney function.  I have discussed this with the patient and he agreed with the plan.  I have called patient's wife and discussed over the phone regarding current clinical condition and plan of care.  I stated that patient may have poor prognosis for long-term dialysis.  #Hyperkalemia: Serum potassium level of 5.7.  Received a dose of Kayexalate.  IV Lasix should help.  Continue to monitor.  #Hyponatremia: Hypervolemic hyponatremia in the setting of liver  disease.  Starting Lasix and treatment for HRS as above.  #History of hypertension: Hold lisinopril.  Blood pressure is acceptable, currently on midodrine.  # NASH, liver disease, transaminitis: Liver enzymes are trending up.  Bilirubin 9.1.  Follow-up with GI.  I discussed above with Dr. Algis Liming.  Subjective: Seen and examined at bedside.  Reported generalized weakness.  No nausea vomiting chest pain or shortness of breath. Objective Vital signs in last 24 hours: Vitals:   04/19/18 1330 04/19/18 1457 04/19/18 2052 04/20/18 0621  BP:  (!) 105/34 (!) 106/31 (!) 119/41  Pulse:  73 73 81  Resp:  18  18  Temp:  98.6 F (37 C) (!) 97.5 F (36.4 C) 97.7 F (36.5 C)  TempSrc:   Oral Oral  SpO2:  91% 92% 93%  Weight: (!) 238.8 kg (526 lb 7.3 oz)     Height:       Weight change:   Intake/Output Summary (Last 24 hours) at 04/20/2018 1624 Last data filed at 04/20/2018 1543 Gross per 24 hour  Intake 1966.16 ml  Output 3 ml  Net 1963.16 ml       Labs: Basic Metabolic Panel: Recent Labs  Lab 04/18/18 0513 04/19/18 0527 04/20/18 0859  NA 128* 128* 129*  K 4.6 5.0 5.7*  CL 100* 98* 98*  CO2 21* 22 22  GLUCOSE 82 63* 90  BUN 40* 48* 57*  CREATININE 1.41* 2.05* 3.31*  CALCIUM 7.7* 7.9* 8.1*   Liver Function Tests: Recent Labs  Lab 04/18/18 0513 04/19/18 0527 04/20/18 0859  AST 1,264* 1,319* 1,619*  ALT 416* 465* 654*  ALKPHOS 129* 132* 128*  BILITOT 6.0* 6.7* 9.1*  PROT 5.3* 5.2* 5.2*  ALBUMIN 1.5* 1.5* 1.8*   Recent Labs  Lab 04/28/2018 1715  LIPASE 41   Recent Labs  Lab 04/15/2018 1900  AMMONIA 52*   CBC: Recent Labs  Lab 04/02/2018 1715 04/14/18 0941 04/16/18 0602 04/19/18 0527  WBC 4.8 5.8 6.7 8.0  HGB 12.3* 10.3* 10.4* 11.0*  HCT 34.5* 29.0* 29.4* 31.1*  MCV 99.1 99.3 99.3 100.0  PLT 81* 81* 83* 93*   Cardiac Enzymes: Recent Labs  Lab 04/19/18 0527 04/20/18 0859  CKTOTAL 18,898* 12,136*   CBG: Recent Labs  Lab 04/19/18 1201  04/19/18 1646 04/20/18 0835 04/20/18 1121  GLUCAP 62* 103* 72 122*    Iron Studies: No results for input(s): IRON, TIBC, TRANSFERRIN, FERRITIN in the last 72 hours. Studies/Results: No results found.  Medications: Infusions: . albumin human      Scheduled Medications: . chlorhexidine  15 mL Mouth Rinse BID  . ferrous sulfate  325 mg Oral Q breakfast  . furosemide  80 mg Intravenous Q8H  . lactulose  30 g Oral TID  . mouth rinse  15 mL Mouth Rinse q12n4p  . midodrine  10 mg Oral TID WC  . nystatin   Topical TID  . octreotide  200 mcg Subcutaneous Q12H  . pantoprazole  80 mg Oral Daily  . penicillin v potassium  500 mg Oral BID  . rifaximin  550 mg Oral BID  . saccharomyces boulardii  250 mg Oral BID  . sertraline  50 mg Oral Daily    have reviewed scheduled and prn medications.  Physical Exam: General: Morbidly obese male sitting on chair, not in distress Heart:RRR, s1s2 nl, no rubs Lungs: Distant breath sound, Abdomen:soft, Non-tender, non-distended Extremities: Bilateral lower extremity pitting edema  Joffrey Kerce Tanna Furry 04/20/2018,4:24 PM  LOS: 4 days

## 2018-04-20 NOTE — Progress Notes (Signed)
Nutrition Brief Note  RD re-consulted  5/19. Pt previously educated on low sodium/cirrhosis diet 5/15. Pt has not had any loss in appetite or weight changes.   Current diet order is renal diet, patient is consuming approximately 100% of meals at this time. Labs and medications reviewed.   No nutrition interventions warranted at this time. If nutrition issues arise, please consult RD.   Mariana Single RD, LDN Clinical Nutrition Pager # [phone]

## 2018-04-21 ENCOUNTER — Inpatient Hospital Stay (HOSPITAL_COMMUNITY): Payer: BLUE CROSS/BLUE SHIELD

## 2018-04-21 LAB — GLUCOSE, CAPILLARY
GLUCOSE-CAPILLARY: 105 mg/dL — AB (ref 65–99)
Glucose-Capillary: 92 mg/dL (ref 65–99)
Glucose-Capillary: 80 mg/dL (ref 65–99)
Glucose-Capillary: 92 mg/dL (ref 65–99)

## 2018-04-21 LAB — COMPREHENSIVE METABOLIC PANEL
ALK PHOS: 114 U/L (ref 38–126)
ALT: 517 U/L — AB (ref 17–63)
AST: 1091 U/L — ABNORMAL HIGH (ref 15–41)
Albumin: 2.1 g/dL — ABNORMAL LOW (ref 3.5–5.0)
Anion gap: 9 (ref 5–15)
BUN: 66 mg/dL — AB (ref 6–20)
CO2: 23 mmol/L (ref 22–32)
CREATININE: 4.69 mg/dL — AB (ref 0.61–1.24)
Calcium: 7.8 mg/dL — ABNORMAL LOW (ref 8.9–10.3)
Chloride: 97 mmol/L — ABNORMAL LOW (ref 101–111)
GFR calc non Af Amer: 14 mL/min — ABNORMAL LOW (ref >60)
GFR, EST AFRICAN AMERICAN: 16 mL/min — AB (ref >60)
Glucose, Bld: 84 mg/dL (ref 65–99)
Potassium: 5.6 mmol/L — ABNORMAL HIGH (ref 3.5–5.1)
SODIUM: 129 mmol/L — AB (ref 135–145)
TOTAL PROTEIN: 4.9 g/dL — AB (ref 6.5–8.1)
Total Bilirubin: 8.7 mg/dL — ABNORMAL HIGH (ref 0.3–1.2)

## 2018-04-21 LAB — MYOGLOBIN, URINE: MYOGLOBIN UR: 3 ng/mL (ref 0–13)

## 2018-04-21 LAB — OSMOLALITY, URINE: Osmolality, Ur: 307 mOsm/kg (ref 300–900)

## 2018-04-21 LAB — NA AND K (SODIUM & POTASSIUM), RAND UR
Potassium Urine: 42 mmol/L
Sodium, Ur: 12 mmol/L

## 2018-04-21 LAB — PROTIME-INR
INR: 3.52
PROTHROMBIN TIME: 35 s — AB (ref 11.4–15.2)

## 2018-04-21 LAB — CK: Total CK: 7500 U/L — ABNORMAL HIGH (ref 49–397)

## 2018-04-21 MED ORDER — SODIUM POLYSTYRENE SULFONATE 15 GM/60ML PO SUSP
30.0000 g | Freq: Once | ORAL | Status: AC
  Administered 2018-04-21: 30 g via ORAL
  Filled 2018-04-21: qty 120

## 2018-04-21 MED ORDER — ALBUMIN HUMAN 25 % IV SOLN
25.0000 g | Freq: Once | INTRAVENOUS | Status: AC
  Administered 2018-04-21: 25 g via INTRAVENOUS
  Filled 2018-04-21: qty 100

## 2018-04-21 MED ORDER — VITAMIN K1 10 MG/ML IJ SOLN
10.0000 mg | Freq: Once | INTRAVENOUS | Status: AC
  Administered 2018-04-21: 10 mg via INTRAVENOUS
  Filled 2018-04-21: qty 1

## 2018-04-21 MED ORDER — FENTANYL CITRATE (PF) 100 MCG/2ML IJ SOLN: 12.5000 ug | INTRAMUSCULAR | Status: DC | PRN

## 2018-04-21 MED ORDER — ACETAMINOPHEN-CODEINE #3 300-30 MG PO TABS
1.0000 | ORAL_TABLET | Freq: Four times a day (QID) | ORAL | Status: DC | PRN
  Administered 2018-04-21: 2 via ORAL
  Filled 2018-04-21: qty 2

## 2018-04-21 MED ORDER — ACETAMINOPHEN 500 MG PO TABS: 500.0000 mg | ORAL_TABLET | Freq: Four times a day (QID) | ORAL | Status: DC | PRN

## 2018-04-21 MED ORDER — MIDODRINE HCL 5 MG PO TABS
15.0000 mg | ORAL_TABLET | Freq: Three times a day (TID) | ORAL | Status: DC
  Administered 2018-04-21 – 2018-04-25 (×11): 15 mg via ORAL
  Filled 2018-04-21 (×14): qty 3

## 2018-04-21 MED ORDER — MIDODRINE HCL 5 MG PO TABS
10.0000 mg | ORAL_TABLET | Freq: Once | ORAL | Status: AC
  Administered 2018-04-21: 10 mg via ORAL
  Filled 2018-04-21: qty 2

## 2018-04-21 NOTE — Progress Notes (Signed)
MD Made aware of low BP. New orders received and implemented

## 2018-04-21 NOTE — Progress Notes (Addendum)
[facility] NEPHROLOGY PROGRESS NOTE  Assessment/ Plan: Pt is a 47 y.o. yo male  Morbidly obese, NASH liver cirrhosis, obligated by hepatic encephalopathy, chronic thrombocytopenia, chronic lower extremities edema, history of hypertension, OSA on CPAP who was treated with Valtrex for peri-orbital shingles 3 weeks prior to admission, presented with weight gain of about 30 pounds in 3 weeks associated with generalized weakness and worsening liver function.  Patient was found to have rhabdomyolysis and worsening renal failure. Transferred to [facility] on 5/20.  Assessment/Plan:  #Acute kidney injury likely multifactorial including ATN due to rhabdomyolysis, lisinopril use, poor renal perfusion in cirrhotic patient.  Other concern is hepatorenal syndrome given low urine sodium in cirrhotic patient. -Treating with octreotide, midodrine, albumin with no improvement in renal function.  Started on IV Lasix for the management of anasarca and anuria.  The serum creatinine level continue to elevate to 4.69.  Serum potassium level elevated but is stable at 5.6.  Patient is not acidotic.  He has no shortness of breath. -Plan to insert Foley catheter for strict ins and out.  Renal ultrasound unremarkable on limited study.  Treat hyperkalemia with oral Kayexalate.  Continue IV Lasix.  INR is elevated therefore I am treating with IV vitamin K.  Repeat lab in the morning.  If remains anuric and worsening renal failure, plan for initiating dialysis acutely. I will keep NPO past midnight.  The CK level and liver enzymes are trending down.  I have discussed this in detail with the patient, his wife and sister at bedside.  Also discussed with primary team.  Given multiple comorbidities, patient is likely a poor candidate for long-term dialysis.  I consulted palliative care to discuss goals of care.  Family agreed with the plan.    #Hyperkalemia: Serum potassium level of 5.6.  Ordered a dose of Kayexalate.  IV Lasix  should help.  Continue to monitor.  #Hyponatremia: Hypervolemic hyponatremia in the setting of liver disease.  Starting Lasix and treatment for HRS as above.  Check urine osmolality and electrolytes.  #History of hypertension: Hold lisinopril.  Blood pressure is acceptable, currently on midodrine.  # NASH, liver disease, transaminitis: Liver enzymes are trending down.  Follow-up GI.  Subjective: Seen and examined at bedside.  Transferred to [facility].  Mental status acceptable.  Denied nausea vomiting chest pain shortness of breath.  Remains anuric. Objective Vital signs in last 24 hours: Vitals:   04/21/18 0500 04/21/18 0700 04/21/18 0900 04/21/18 1100  BP:      Pulse:      Resp: 19 18 19  (!) 23  Temp:      TempSrc:      SpO2: 93% (!) 85% 97% 100%  Weight:      Height:       Weight change: 3.874 kg (8 lb 8.7 oz)  Intake/Output Summary (Last 24 hours) at 04/21/2018 1222 Last data filed at 04/21/2018 0730 Gross per 24 hour  Intake 2532.83 ml  Output -  Net 2532.83 ml       Labs: Basic Metabolic Panel: Recent Labs  Lab 04/20/18 0859 04/20/18 1751 04/21/18 0954  NA 129* 127* 129*  K 5.7* 5.6* 5.6*  CL 98* 96* 97*  CO2 22 21* 23  GLUCOSE 90 119* 84  BUN 57* 58* 66*  CREATININE 3.31* 3.78* 4.69*  CALCIUM 8.1* 8.1* 7.8*   Liver Function Tests: Recent Labs  Lab 04/19/18 0527 04/20/18 0859 04/21/18 0954  AST 1,319* 1,619* 1,091*  ALT 465* 654* 517*  ALKPHOS 132* 128* 114  BILITOT 6.7* 9.1* 8.7*  PROT 5.2* 5.2* 4.9*  ALBUMIN 1.5* 1.8* 2.1*   No results for input(s): LIPASE, AMYLASE in the last 168 hours. No results for input(s): AMMONIA in the last 168 hours. CBC: Recent Labs  Lab 04/16/18 0602 04/19/18 0527  WBC 6.7 8.0  HGB 10.4* 11.0*  HCT 29.4* 31.1*  MCV 99.3 100.0  PLT 83* 93*   Cardiac Enzymes: Recent Labs  Lab 04/19/18 0527 04/20/18 0859 04/21/18 0954  CKTOTAL 18,898* 12,136* 7,500*   CBG: Recent Labs  Lab 04/20/18 1121  04/20/18 1631 04/20/18 2122 04/21/18 0602 04/21/18 1138  GLUCAP 122* 108* 102* 92 80    Iron Studies: No results for input(s): IRON, TIBC, TRANSFERRIN, FERRITIN in the last 72 hours. Studies/Results: US Renal  Result Date: 04/21/2018 CLINICAL DATA:  Acute kidney injury. EXAM: RENAL / URINARY TRACT ULTRASOUND COMPLETE COMPARISON:  Abdominal ultrasound 10/01/2017 FINDINGS: Technically limited exam due to habitus. Right Kidney: Length: 12.5 cm. No evidence of hydronephrosis. More detailed evaluation is limited. Left Kidney: Length: 12.8 cm. No evidence of hydronephrosis. More detailed evaluation is limited. Bladder: Appears normal for degree of bladder distention. Right pleural effusion is incidentally noted. IMPRESSION: No hydronephrosis. More detailed evaluation of the kidneys is limited due to habitus. Electronically Signed   By: Jeb Levering M.D.   On: 04/21/2018 03:04    Medications: Infusions: . albumin human    . phytonadione (VITAMIN K) IV      Scheduled Medications: . chlorhexidine  15 mL Mouth Rinse BID  . ferrous sulfate  325 mg Oral Q breakfast  . furosemide  80 mg Intravenous Q8H  . lactulose  30 g Oral TID  . mouth rinse  15 mL Mouth Rinse q12n4p  . midodrine  10 mg Oral TID WC  . nystatin   Topical TID  . octreotide  200 mcg Subcutaneous Q12H  . pantoprazole  80 mg Oral Daily  . penicillin v potassium  500 mg Oral BID  . rifaximin  550 mg Oral BID  . saccharomyces boulardii  250 mg Oral BID  . sertraline  50 mg Oral Daily    have reviewed scheduled and prn medications.  Physical Exam: General: Morbidly obese male lying on bed, not in distress Heart: Regular rate rhythm S1-S2 normal, no rubs Lungs: Distant breath sound Abdomen:soft, Non-tender, non-distended Extremities: Bilateral lower extremity pitting edema, unchanged  Dron Tanna Furry 04/21/2018,12:22 PM  LOS: 5 days

## 2018-04-21 NOTE — Progress Notes (Addendum)
[facility] TEAM 1 - Stepdown/ICU TEAM  ILIR MAHRT  PZW:[id number] DOB: Nov 14, 1971 DOA: 04/15/2018 PCP: Esaw Grandchild, NP    Brief Narrative:  47yo M w/ a hx of NASH cirrhosis complicated by hepatic encephalopathy & chronic thrombocytopenia, chronic lower extremity edema, morbid obesity, GERD, HLD, HTN, OSA on CPAP, and recent periorbital shingles tx with Valtrex who presented to ED with generalized weakness, weight gain of approximately 30 pounds in 3 weeks and worsened LFTs.    He was initially admitted to [facility], but transferred to [facility] after anuric renal failure was appreciated.    Significant Events: 5/13 admit to [facility] 5/20 transfer to [facility]  Subjective: I spoke with the patient and his wife at the bedside.  The patient is alert and conversant.  I shared my severe concern given his rapid renal decline and essential anuria.  We discussed the very short-term use of hemodialysis as a possible bridge to spontaneous renal recovery, and the fact that failure to see renal recovery would indicate an extremely grim prognosis.  The patient is currently complaining of pain related to the condom cath he is home for a number of days.  He also complains of irritation of the chest which is very superficial.  He denies significant shortness of breath.  He reports some low-grade nausea but no vomiting.  He has very little appetite.  Assessment & Plan:  NASH cirrhosis - acute hepatitis Acute hepatitis panel, ds DNA Ab, ANA, CMV IgM, ASMA, antimitochondrial antibodies negative - EBV IgM negative, IgG positive (>600) - RSV: Positive - Abdominal US 5/14 noted no significant ascites - since admission, LFTs worse - no liver biopsy because not a candidate for liver transplant and abnormal LFTs may be explained by rhabdomyolysis per GI - avoid APAP - follow trend - ?due to valtrex  Rhabdomyolysis CK 5/19 18,898 - unclear etiology - pt confirms no history of falls - hydration not appropriate in setting of anuric  renal failure - may require HD - CK trending down   Recent Labs  Lab 04/19/18 0527 04/20/18 0859 04/21/18 0954  CKTOTAL 18,898* 12,136* 7,500*    Acute kidney failure - Anuric  ATN from rhabdomyolysis versus hepatorenal syndrome - Nephrology following - cont subcutaneous octreotide, oral midodrine, IV albumin   Recent Labs  Lab 04/18/18 0513 04/19/18 0527 04/20/18 0859 04/20/18 1751 04/21/18 0954  CREATININE 1.41* 2.05* 3.31* 3.78* 4.69*    Coagulopathy Due to above - dose w/ vitamin K and f/u in AM   Anasarca Secondary to liver disease and anuric renal failure - no response to high dose lasix along w/ albumin - appears he will require trial of HD   Hyperkalemia Cont Kayexalate - need for HD anticipated over next 24hrs   Recent Labs  Lab 04/18/18 0513 04/19/18 0527 04/20/18 0859 04/20/18 1751 04/21/18 0954  K 4.6 5.0 5.7* 5.6* 5.6*    Morbid obesity - Body mass index is 64.08 kg/m  Hyponatremia secondary to liver disease and anasarca/hypervolemia - cont trial of lasix - for probable HD   Macrocytic anemia Check folate and B12 levels   Thrombocytopenia Due to cirrhosis - stable for now   OSA Continue nightly CPAP  DVT prophylaxis: SCDs Code Status: FULL CODE Family Communication: no family present at time of exam  Disposition Plan:   Consultants:  [facility]  Gen Surgery   Antimicrobials:  None   Objective: Blood pressure (!) 119/31, pulse 76, temperature 98.3 F (36.8 C), temperature source Oral, resp.  rate 19, height 6\' 5"  (1.956 m), weight (!) 242.7 kg (535 lb), SpO2 94 %.  Intake/Output Summary (Last 24 hours) at 04/21/2018 1117 Last data filed at 04/21/2018 0541 Gross per 24 hour  Intake 2292.83 ml  Output -  Net 2292.83 ml   Filed Weights   04/18/18 0500 04/19/18 1330 04/21/18 0001  Weight: (!) 231.4 kg (510 lb 2 oz) (!) 238.8 kg (526 lb 7.3 oz) (!) 242.7 kg (535 lb)    Examination: General: No acute respiratory  distress at rest in bed  Lungs: Distant breath sounds in all fields related to morbid obesity - no wheezing appreciable Cardiovascular: Distant heart sounds but regular rate and rhythm without appreciable murmur Abdomen: Morbidly obese, soft, nontender, bowel sounds positive Extremities: 3+ anasarca  CBC: Recent Labs  Lab 04/16/18 0602 04/19/18 0527  WBC 6.7 8.0  HGB 10.4* 11.0*  HCT 29.4* 31.1*  MCV 99.3 100.0  PLT 83* 93*   Basic Metabolic Panel: Recent Labs  Lab 04/20/18 0859 04/20/18 1751 04/21/18 0954  NA 129* 127* 129*  K 5.7* 5.6* 5.6*  CL 98* 96* 97*  CO2 22 21* 23  GLUCOSE 90 119* 84  BUN 57* 58* 66*  CREATININE 3.31* 3.78* 4.69*  CALCIUM 8.1* 8.1* 7.8*   GFR: Estimated Creatinine Clearance: 41.9 mL/min (A) (by C-G formula based on SCr of 4.69 mg/dL (H)).  Liver Function Tests: Recent Labs  Lab 04/18/18 0513 04/19/18 0527 04/20/18 0859 04/21/18 0954  AST 1,264* 1,319* 1,619* 1,091*  ALT 416* 465* 654* 517*  ALKPHOS 129* 132* 128* 114  BILITOT 6.0* 6.7* 9.1* 8.7*  PROT 5.3* 5.2* 5.2* 4.9*  ALBUMIN 1.5* 1.5* 1.8* 2.1*    Coagulation Profile: Recent Labs  Lab 04/21/18 0954  INR 3.52    Cardiac Enzymes: Recent Labs  Lab 04/19/18 0527 04/20/18 0859  CKTOTAL 18,898* 12,136*    HbA1C: Hemoglobin A1C  Date/Time Value Ref Range Status  12/16/2017 09:26 AM 4.9  Final  04/22/2017 03:50 PM 4.9  Final   Hgb A1c MFr Bld  Date/Time Value Ref Range Status  09/16/2017 09:05 AM 5.2 4.8 - 5.6 % Final    Comment:             Prediabetes: 5.7 - 6.4          Diabetes: >6.4          Glycemic control for adults with diabetes: <7.0   01/16/2017 08:59 AM 4.7 (L) 4.8 - 5.6 % Final    Comment:             Pre-diabetes: 5.7 - 6.4          Diabetes: >6.4          Glycemic control for adults with diabetes: <7.0     CBG: Recent Labs  Lab 04/20/18 0835 04/20/18 1121 04/20/18 1631 04/20/18 2122 04/21/18 0602  GLUCAP 72 122* 108* 102* 92     Recent Results (from the past 240 hour(s))  MRSA PCR Screening     Status: None   Collection Time: 04/14/18  3:19 PM  Result Value Ref Range Status   MRSA by PCR NEGATIVE NEGATIVE Final    Comment:        The GeneXpert MRSA Assay (FDA approved for NASAL specimens only), is one component of a comprehensive MRSA colonization surveillance program. It is not intended to diagnose MRSA infection nor to guide or monitor treatment for MRSA infections. Performed at [facility], [address]  [place], [place] [address]      Scheduled Meds: . chlorhexidine  15 mL Mouth Rinse BID  . ferrous sulfate  325 mg Oral Q breakfast  . furosemide  80 mg Intravenous Q8H  . lactulose  30 g Oral TID  . mouth rinse  15 mL Mouth Rinse q12n4p  . midodrine  10 mg Oral TID WC  . nystatin   Topical TID  . octreotide  200 mcg Subcutaneous Q12H  . pantoprazole  80 mg Oral Daily  . penicillin v potassium  500 mg Oral BID  . rifaximin  550 mg Oral BID  . saccharomyces boulardii  250 mg Oral BID  . sertraline  50 mg Oral Daily     LOS: 5 days   Cherene Altes, MD [facility] Office  [phone] Pager - Text Page per Amion as per below:  On-Call/Text Page:      [email or web address]      password [email or web address]  If 7PM-7AM, please contact night-coverage [email or web address] Password [email or web address] 04/21/2018, 11:17 AM

## 2018-04-21 NOTE — Progress Notes (Signed)
On arrival patient was already on his CPAP no distress or complications noted.

## 2018-04-21 NOTE — Progress Notes (Signed)
[facility] Gastroenterology Progress Note  Ryan Burgess 47 y.o. 02-19-1971   Subjective: Resting in bed. Denies abdominal pain. Wife, sister, and pastor at bedside.  Objective: Vital signs: Vitals:   04/21/18 0136 04/21/18 0400  BP:  (!) 119/31  Pulse: 76 76  Resp:  19  Temp:    SpO2: 94% 94%  T 98.3  Physical Exam: Gen: lethargic, no acute distress, morbidly obese HEENT: +icteric sclera CV: RRR Chest: CTA B Abd: soft, nontender, nondistended, +BS, obese   Lab Results: Recent Labs    04/20/18 0859 04/20/18 1751  NA 129* 127*  K 5.7* 5.6*  CL 98* 96*  CO2 22 21*  GLUCOSE 90 119*  BUN 57* 58*  CREATININE 3.31* 3.78*  CALCIUM 8.1* 8.1*   Recent Labs    04/19/18 0527 04/20/18 0859  AST 1,319* 1,619*  ALT 465* 654*  ALKPHOS 132* 128*  BILITOT 6.7* 9.1*  PROT 5.2* 5.2*  ALBUMIN 1.5* 1.8*   Recent Labs    04/19/18 0527  WBC 8.0  HGB 11.0*  HCT 31.1*  MCV 100.0  PLT 93*      Assessment/Plan: Decompensated cirrhosis with worsening renal function and rhabdomyolysis - unclear source of rhabdomyolysis but CK level improving. Elevated AST/ALT likely due to rhabdomyolysis. Labs from today pending at this time. Hepatorenal syndrome still in differential. Agree with palliative involvement especially if he needs to be started on dialysis. No new GI recs. Continues supportive care. Family very supportive. Will follow.   Ryan Burgess. 04/21/2018, 10:12 AM  Questions please call [phone] ID: Ryan Burgess, male   DOB: 06-18-1971, 47 y.o.   MRN: [id number]

## 2018-04-22 ENCOUNTER — Inpatient Hospital Stay (HOSPITAL_COMMUNITY): Payer: BLUE CROSS/BLUE SHIELD

## 2018-04-22 DIAGNOSIS — K767 Hepatorenal syndrome: Principal | ICD-10-CM

## 2018-04-22 DIAGNOSIS — G4733 Obstructive sleep apnea (adult) (pediatric): Secondary | ICD-10-CM

## 2018-04-22 DIAGNOSIS — K7460 Unspecified cirrhosis of liver: Secondary | ICD-10-CM

## 2018-04-22 DIAGNOSIS — K7469 Other cirrhosis of liver: Secondary | ICD-10-CM

## 2018-04-22 DIAGNOSIS — D689 Coagulation defect, unspecified: Secondary | ICD-10-CM

## 2018-04-22 DIAGNOSIS — R579 Shock, unspecified: Secondary | ICD-10-CM

## 2018-04-22 DIAGNOSIS — N179 Acute kidney failure, unspecified: Secondary | ICD-10-CM

## 2018-04-22 DIAGNOSIS — R140 Abdominal distension (gaseous): Secondary | ICD-10-CM

## 2018-04-22 DIAGNOSIS — B179 Acute viral hepatitis, unspecified: Secondary | ICD-10-CM

## 2018-04-22 DIAGNOSIS — Z6841 Body Mass Index (BMI) 40.0 and over, adult: Secondary | ICD-10-CM

## 2018-04-22 LAB — COMPREHENSIVE METABOLIC PANEL
ALT: 378 U/L — AB (ref 17–63)
AST: 738 U/L — ABNORMAL HIGH (ref 15–41)
Albumin: 2.6 g/dL — ABNORMAL LOW (ref 3.5–5.0)
Alkaline Phosphatase: 101 U/L (ref 38–126)
Anion gap: 9 (ref 5–15)
BUN: 71 mg/dL — ABNORMAL HIGH (ref 6–20)
CO2: 25 mmol/L (ref 22–32)
CREATININE: 5.77 mg/dL — AB (ref 0.61–1.24)
Calcium: 7.6 mg/dL — ABNORMAL LOW (ref 8.9–10.3)
Chloride: 97 mmol/L — ABNORMAL LOW (ref 101–111)
GFR calc non Af Amer: 11 mL/min — ABNORMAL LOW (ref >60)
GFR, EST AFRICAN AMERICAN: 12 mL/min — AB (ref >60)
Glucose, Bld: 78 mg/dL (ref 65–99)
Potassium: 5.1 mmol/L (ref 3.5–5.1)
Sodium: 131 mmol/L — ABNORMAL LOW (ref 135–145)
Total Bilirubin: 8.9 mg/dL — ABNORMAL HIGH (ref 0.3–1.2)
Total Protein: 5 g/dL — ABNORMAL LOW (ref 6.5–8.1)

## 2018-04-22 LAB — CBC
HCT: 24.1 % — ABNORMAL LOW (ref 39.0–52.0)
Hemoglobin: 8.2 g/dL — ABNORMAL LOW (ref 13.0–17.0)
MCH: 35 pg — AB (ref 26.0–34.0)
MCHC: 34 g/dL (ref 30.0–36.0)
MCV: 103 fL — ABNORMAL HIGH (ref 78.0–100.0)
PLATELETS: 63 10*3/uL — AB (ref 150–400)
RBC: 2.34 MIL/uL — AB (ref 4.22–5.81)
RDW: 19.4 % — ABNORMAL HIGH (ref 11.5–15.5)
WBC: 5.6 10*3/uL (ref 4.0–10.5)

## 2018-04-22 LAB — PROTIME-INR
INR: 3.87
Prothrombin Time: 37.7 seconds — ABNORMAL HIGH (ref 11.4–15.2)

## 2018-04-22 LAB — GLUCOSE, CAPILLARY
Glucose-Capillary: 75 mg/dL (ref 65–99)
Glucose-Capillary: 72 mg/dL (ref 65–99)
Glucose-Capillary: 69 mg/dL (ref 65–99)
Glucose-Capillary: 81 mg/dL (ref 65–99)
Glucose-Capillary: 83 mg/dL (ref 65–99)
Glucose-Capillary: 72 mg/dL (ref 65–99)

## 2018-04-22 LAB — TYPE AND SCREEN
ABO/RH(D): O POS
ANTIBODY SCREEN: NEGATIVE

## 2018-04-22 LAB — ABO/RH: ABO/RH(D): O POS

## 2018-04-22 LAB — CK: CK TOTAL: 4826 U/L — AB (ref 49–397)

## 2018-04-22 MED ORDER — PRISMASOL BGK 0/2.5 32-2.5 MEQ/L IV SOLN
INTRAVENOUS | Status: DC
  Administered 2018-04-22 – 2018-04-23 (×2): via INTRAVENOUS_CENTRAL
  Filled 2018-04-22 (×7): qty 5000

## 2018-04-22 MED ORDER — DEXTROSE 50 % IV SOLN
INTRAVENOUS | Status: AC
  Administered 2018-04-22: 50 mL
  Filled 2018-04-22: qty 50

## 2018-04-22 MED ORDER — NOREPINEPHRINE 16 MG/250ML-% IV SOLN
0.0000 ug/min | INTRAVENOUS | Status: DC
  Administered 2018-04-22: 2 ug/min via INTRAVENOUS
  Administered 2018-04-23: 35 ug/min via INTRAVENOUS
  Administered 2018-04-23: 40 ug/min via INTRAVENOUS
  Administered 2018-04-23: 50 ug/min via INTRAVENOUS
  Administered 2018-04-24: 40 ug/min via INTRAVENOUS
  Administered 2018-04-24: 50 ug/min via INTRAVENOUS
  Administered 2018-04-24: 40 ug/min via INTRAVENOUS
  Administered 2018-04-24: 50 ug/min via INTRAVENOUS
  Administered 2018-04-25: 40 ug/min via INTRAVENOUS
  Filled 2018-04-22 (×9): qty 250

## 2018-04-22 MED ORDER — PRISMASOL BGK 0/2.5 32-2.5 MEQ/L IV SOLN
INTRAVENOUS | Status: DC
  Administered 2018-04-22 – 2018-04-23 (×3): via INTRAVENOUS_CENTRAL
  Filled 2018-04-22 (×14): qty 5000

## 2018-04-22 MED ORDER — HEPARIN SODIUM (PORCINE) 1000 UNIT/ML DIALYSIS
1000.0000 [IU] | INTRAMUSCULAR | Status: DC | PRN
  Administered 2018-04-22 – 2018-04-25 (×2): 2400 [IU] via INTRAVENOUS_CENTRAL
  Filled 2018-04-22 (×2): qty 6
  Filled 2018-04-22: qty 4
  Filled 2018-04-22: qty 6

## 2018-04-22 MED ORDER — PRISMASOL BGK 0/2.5 32-2.5 MEQ/L IV SOLN
INTRAVENOUS | Status: DC
  Administered 2018-04-22 – 2018-04-25 (×7): via INTRAVENOUS_CENTRAL
  Filled 2018-04-22 (×8): qty 5000

## 2018-04-22 MED ORDER — PHENYLEPHRINE HCL-NACL 10-0.9 MG/250ML-% IV SOLN: 0.0000 ug/min | INTRAVENOUS | Status: DC

## 2018-04-22 MED ORDER — SODIUM CHLORIDE 0.9 % IV SOLN
Freq: Once | INTRAVENOUS | Status: AC
  Administered 2018-04-22: 11:00:00 via INTRAVENOUS

## 2018-04-22 MED ORDER — SODIUM CHLORIDE 0.9 % FOR CRRT
INTRAVENOUS_CENTRAL | Status: DC | PRN
  Filled 2018-04-22: qty 1000

## 2018-04-22 MED ORDER — SODIUM CHLORIDE 0.9 % IV SOLN: INTRAVENOUS | Status: DC | PRN

## 2018-04-22 MED ORDER — NOREPINEPHRINE 4 MG/250ML-% IV SOLN
0.0000 ug/min | INTRAVENOUS | Status: DC
  Filled 2018-04-22: qty 250

## 2018-04-22 NOTE — Progress Notes (Signed)
PMT consult received and chart reviewed. Will initiate goals of care with patient/family tomorrow, 5/23. Thank you.  NO CHARGE  Ihor Dow, FNP-C Palliative Medicine Team  Phone: [phone] Fax: [phone]

## 2018-04-22 NOTE — Consult Note (Signed)
PULMONARY / CRITICAL CARE MEDICINE   Name: Ryan Burgess MRN: [id number] DOB: 1971/11/16    ADMISSION DATE:  04/16/2018 CONSULTATION DATE:  04/22/2018   REFERRING MD:  Sherral Hammers  CHIEF COMPLAINT:  Hypotension, renal failure  HISTORY OF PRESENT ILLNESS:   47 y/o male with NASH cirrhosis was admitted to [facility] in the setting of worsening renal failure.  He has been followed here by hepatology and the renal team.  He has been given midodrine and octreotide here but despite that his renal function has worsened.  He was moved to the ICU for CRRT. He has been hypotensive here, baseline systolic blood pressure is 140.  In the ICU he was noted to be profoundly hypotensive.  Attempts were made a placing an arterial line but were unsuccessful.    PAST MEDICAL HISTORY :  He  has a past medical history of Cellulitis, Cirrhosis, nonalcoholic ([place]), Dyspnea, GERD (gastroesophageal reflux disease), Headache, Hepatic encephalopathy ([place]) (~ 2016 X 2), High cholesterol, Hypertension, Kidney stones (01/2015), Migraine, Morbid obesity with body mass index of 50.0-59.9 in adult [facility]) (01/25/2015), OSA on CPAP, Pre-diabetes, Snoring, and Venous stasis.  PAST SURGICAL HISTORY: He  has a past surgical history that includes TEE without cardioversion (N/A, 04/03/2015); Esophagogastroduodenoscopy (egd) with propofol (N/A, 07/26/2015); Wisdom tooth extraction (1990); and Vasectomy.  No Known Allergies  No current facility-administered medications on file prior to encounter.    Current Outpatient Medications on File Prior to Encounter  Medication Sig  . aspirin EC 81 MG EC tablet Take 1 tablet (81 mg total) by mouth daily.  Marland Kitchen atorvastatin (LIPITOR) 20 MG tablet TAKE 1 TABLET BY MOUTH AT BEDTIME  . DEXILANT 60 MG capsule TAKE 1 CAPSULE BY MOUTH DAILY.  . ferrous sulfate 325 (65 FE) MG EC tablet Take 325 mg by mouth daily with breakfast.  . furosemide (LASIX) 40 MG tablet Take 1 tablet (40 mg total) by mouth daily. (Patient  taking differently: Take 40 mg by mouth 2 (two) times daily. )  . lactulose, encephalopathy, (CHRONULAC) 10 GM/15ML SOLN Take 10 g by mouth 2 (two) times daily as needed (constipation). Take 45 mls by mouth 3 times a day  . lisinopril (PRINIVIL,ZESTRIL) 10 MG tablet Take 10 mg by mouth daily.  . penicillin v potassium (VEETID) 500 MG tablet Take 500 mg by mouth 2 (two) times daily.  . rifaximin (XIFAXAN) 550 MG TABS tablet Take 1 tablet (550 mg total) by mouth 2 (two) times daily.  Marland Kitchen saccharomyces boulardii (FLORASTOR) 250 MG capsule Take 1 capsule (250 mg total) by mouth 2 (two) times daily.  . sertraline (ZOLOFT) 50 MG tablet Take 1 tablet (50 mg total) by mouth daily.  . blood glucose meter kit and supplies KIT Dispense based on patient and insurance preference. Use up to four times daily as directed. (FOR ICD-9 250.00, 250.01).  . ferrous sulfate 325 (65 FE) MG EC tablet Take 1 tablet (325 mg total) by mouth daily with breakfast.  . loratadine (CLARITIN) 10 MG tablet Take 10 mg by mouth daily as needed for allergies.  . Vitamin D, Ergocalciferol, (DRISDOL) 50000 units CAPS capsule TAKE 1 CAPSULE BY MOUTH EVERY 7 DAYS.    FAMILY HISTORY:  His indicated that his mother is deceased. He indicated that his father is alive. He indicated that the status of his maternal grandmother is unknown. He indicated that the status of his maternal grandfather is unknown. He indicated that the status of his paternal grandmother is unknown. He indicated that  the status of his paternal grandfather is unknown.   SOCIAL HISTORY: He  reports that he has never smoked. He has never used smokeless tobacco. He reports that he drinks about 0.6 - 1.2 oz of alcohol per week. He reports that he does not use drugs.  REVIEW OF SYSTEMS:   Cannot obtain   SUBJECTIVE:  As above  VITAL SIGNS: BP (!) 81/30 (BP Location: Right Wrist)   Pulse 70   Temp 98.1 F (36.7 C) (Oral)   Resp 17   Ht '6\' 5"'$  (1.956 m)   Wt (!)  243.1 kg (536 lb)   SpO2 95%   BMI 63.56 kg/m   HEMODYNAMICS:    VENTILATOR SETTINGS:    INTAKE / OUTPUT: I/O last 3 completed shifts: In: 1816 [P.O.:1336; Other:80; IV Piggyback:400] Out: 725 [Urine:225; Stool:500]  PHYSICAL EXAMINATION:  General: Obese male resting comfortably in bed HENT: NCAT OP clear PULM: normal effort CV: cap refill normal, palpable radial and femoral pulses GI: soft, nontender MSK: normal bulk and tone Neuro: drowsy but awake, conversant   LABS:  BMET Recent Labs  Lab 04/20/18 1751 04/21/18 0954 04/22/18 0301  NA 127* 129* 131*  K 5.6* 5.6* 5.1  CL 96* 97* 97*  CO2 21* 23 25  BUN 58* 66* 71*  CREATININE 3.78* 4.69* 5.77*  GLUCOSE 119* 84 78    Electrolytes Recent Labs  Lab 04/20/18 1751 04/21/18 0954 04/22/18 0301  CALCIUM 8.1* 7.8* 7.6*    CBC Recent Labs  Lab 04/16/18 0602 04/19/18 0527 04/22/18 0301  WBC 6.7 8.0 5.6  HGB 10.4* 11.0* 8.2*  HCT 29.4* 31.1* 24.1*  PLT 83* 93* 63*    Coag's Recent Labs  Lab 04/21/18 0954 04/22/18 0301  INR 3.52 3.87    Sepsis Markers No results for input(s): LATICACIDVEN, PROCALCITON, O2SATVEN in the last 168 hours.  ABG No results for input(s): PHART, PCO2ART, PO2ART in the last 168 hours.  Liver Enzymes Recent Labs  Lab 04/20/18 0859 04/21/18 0954 04/22/18 0301  AST 1,619* 1,091* 738*  ALT 654* 517* 378*  ALKPHOS 128* 114 101  BILITOT 9.1* 8.7* 8.9*  ALBUMIN 1.8* 2.1* 2.6*    Cardiac Enzymes No results for input(s): TROPONINI, PROBNP in the last 168 hours.  Glucose Recent Labs  Lab 04/21/18 1138 04/21/18 1640 04/21/18 2043 04/22/18 0606 04/22/18 1210 04/22/18 1629  GLUCAP 80 92 105* 75 72 69    Imaging Dg Chest Portable 1 View  Result Date: 04/22/2018 CLINICAL DATA:  Status post central line placement today. EXAM: PORTABLE CHEST 1 VIEW COMPARISON:  PA and lateral chest 11/18/2017. FINDINGS: New right IJ approach dialysis catheter is in place with  the tip projecting in the mid superior vena cava. No pneumothorax. There is cardiomegaly and pulmonary edema. No definite pleural effusion. IMPRESSION: Right IJ catheter tip projects in the mid superior vena cava. Negative for pneumothorax. Cardiomegaly and pulmonary edema. Electronically Signed   By: Inge Rise M.D.   On: 04/22/2018 14:21     STUDIES:    CULTURES:   ANTIBIOTICS:   SIGNIFICANT EVENTS:   LINES/TUBES: 5/22 R IJ HD cath   DISCUSSION: 47 year old male with a past medical history significant for nonalcoholic  steatohepatitis related cirrhosis here with worsening renal failure likely due to hepatorenal syndrome.  At baseline he has morbid obesity, history of hepatic encephalopathy.  Overall prognosis is poor.  Moved to the ICU for vasopressor support and CVVHD.  ASSESSMENT / PLAN:  PULMONARY A: No acute issues P:  Monitor respiratory status  CARDIOVASCULAR A:  Hypotension in setting of advanced cirrhosis, likely due to increase splancnic blood flow P:  Start Levophed for mean arterial pressure greater than 85 Telemetry monitoring  RENAL A:   Hepatorenal syndrome P:   Start norepinephrine CVVHD per renal  GASTROINTESTINAL A:   Advanced cirrhosis, poor prognosis P:   Follow-up GI recommendations Diet as tolerated  HEMATOLOGIC A:   Chronic thrombocytopenia P:  Monitor for bleeding  INFECTIOUS A:   No acute issues P:   Monitor for fever  ENDOCRINE A:   No acute issues P:   Monitor glucose  NEUROLOGIC A:   History of hepatic encephalopathy P:   No sedating medications Continue rifaximin  FAMILY  - Updates: None bedside  - Inter-disciplinary family meet or Palliative Care meeting due by: Would pursue ongoing conversation about goals of care as overall prognosis here is dismal.   My cc time 35 minutes  Roselie Awkward, MD [facility] Pager: [phone] Cell: [phone] After 3pm or if no response, call  [phone]  04/22/2018, 6:12 PM

## 2018-04-22 NOTE — Progress Notes (Signed)
Received IV lasix 80 mg twice this shift and still has no urine output. Bladder scan only showed 5 cc.  Lupita Dawn, RN

## 2018-04-22 NOTE — Progress Notes (Signed)
PROGRESS NOTE    Ryan Burgess  TDD:[id number] DOB: 12-09-1970 DOA: 04/26/2018 PCP: Esaw Grandchild, NP   Brief Narrative:  47yo WM PMHx NASH cirrhosis complicated by hepatic encephalopathy & chronic thrombocytopenia, chronic lower extremity edema, morbid obesity, GERD, HLD, HTN, OSA on CPAP, and recent periorbital shingles tx with Valtrex who presented to ED with generalized weakness, weight gain of approximately 30 pounds in 3 weeks and worsened LFTs.     He was initially admitted to [facility], but transferred to [facility] after anuric renal failure was appreciated.      Subjective: 5/22 A/O x4, negative CP, negative S OB, negative abdominal pain    Assessment & Plan:   Principal Problem:   Cirrhosis ([place]) Active Problems:   HTN (hypertension)   NASH (nonalcoholic steatohepatitis)   Acute hepatitis  NASH cirrhosis/acute Hepatitis - Acute hepatitis panel, double stranded DNA Ab, CMV IgM,MA, antimitochondrial antibodies are negative - EBV IgM negative, IgG positive (>600) -Positive RSV - Abdominal ultrasound negative for ascites see results below -Patient not candidate for liver biopsy secondary to multiple medical problems - Avoid APAP  -Continue to follow liver enzymes.  Given patient's poor renal output, poor BP expect they may worsen.   Rhabdomyolysis  -Although unable to hydrate patient secondary to renal failure CKD continues to trend down.  Patient will start CRRT 5/22  Recent Labs  Lab 04/19/18 0527 04/20/18 0859 04/21/18 0954 04/22/18 0301  CKTOTAL 18,898* 12,136* 7,500* 4,826*   Coagulopathy -Secondary to liver failure - 5/22 transfused 2 units FFP -5/22 Albumin 50 g   Acute renal failure -Anuric: Scheduled to start CRRT 5/22.  Spoke with Dr. Carolin Sicks, nephrology states patient not candidate for long-term HD but will try short course of CRRT starting today. - Midodrin 10 mg TID  - 5/22 Albumin 50 g  Recent Labs  Lab 04/17/18 0556 04/18/18 0513  04/19/18 0527 04/20/18 0859 04/20/18 1751 04/21/18 0954 04/22/18 0301  CREATININE 1.10 1.41* 2.05* 3.31* 3.78* 4.69* 5.77*   Anasarca -Multifactorial, hepatorenal syndrome -Patient understands may not survive this hospitalization secondary to increasing renal failure.    Hyperkalemia -5/22 patient will be started on CRRT  Hyponatremia -Mild secondary to liver disease and anasarca/hypervolemia, expect to correct with CRRT   Morbid obesity - Body mass index 64.08 kg/meter       Macrocytic anemia Check folate and B12 levels    Thrombocytopenia -Secondary to liver cirrhosis will need to monitor closely for bleeding     OSA -CPAP QHS and PRN     Goals of care 5/22 PALLIATIVE CARE: Multisystem organ failure, significant chance patient will not survive this hospitalization discuss CODE STATUS changed to DNR, short-term vs long-term goals of care vs hospice.     DVT prophylaxis: SCD Code Status: Full Family Communication: Wife at bedside for discussion of plan of care Disposition Plan: Critically ill significant chance may not survive this hospitalization   Consultants:  [facility]  Gen Surgery      Procedures/Significant Events:  5/13 admit to [facility] 5/13 abdominal ultrasound negative ascites 5/20 transfer to [facility] 5/21 renal ultrasound negative hydronephrosis: Limited evaluation secondary to body habitus 5/22 RIGHT IJ HD cath 5/22 transfused 2 units FFP    I have personally reviewed and interpreted all radiology studies and my findings are as above.  VENTILATOR SETTINGS:    Cultures   Antimicrobials: Anti-infectives (From admission, onward)   Start     Stop   04/20/18 2200  penicillin v potassium (VEETID) tablet  500 mg         04/14/18 1000  penicillin v potassium (VEETID) tablet 500 mg  Status:  Discontinued     04/20/18 1040   04/14/18 0045  rifaximin (XIFAXAN) tablet 550 mg              Devices    LINES / TUBES:  RIGHT IJ HD cath  5/22>>      Continuous Infusions: . albumin human       Objective: Vitals:   04/21/18 2357 04/22/18 0407 04/22/18 0728 04/22/18 0802  BP: (!) 83/33 (!) 87/34 (!) 82/23 (!) 83/34  Pulse: 71 67 69   Resp: 16 18 18    Temp:   97.8 F (36.6 C)   TempSrc:   Oral   SpO2: 96% 94% 94%   Weight:  (!) 536 lb (243.1 kg)    Height:        Intake/Output Summary (Last 24 hours) at 04/22/2018 0827 Last data filed at 04/22/2018 9518 Gross per 24 hour  Intake 1016 ml  Output 725 ml  Net 291 ml   Filed Weights   04/19/18 1330 04/21/18 0001 04/22/18 0407  Weight: (!) 526 lb 7.3 oz (238.8 kg) (!) 535 lb (242.7 kg) (!) 536 lb (243.1 kg)    Examination:  General: A/O x4, No acute respiratory distress Neck:  Negative scars, masses, torticollis, lymphadenopathy, JVD, RIGHT IJ HD cath with pigtail present covered and clean Lungs: extremely difficult to auscultate given patient's body habitus but appears clear to auscultation bilaterally, negative wheezes or crackles  Cardiovascular: Regular rate and rhythm without murmur gallop or rub normal S1 and S2 Abdomen: MORBIDLY OBESE, negative abdominal pain, nondistended, positive soft, bowel sounds, no rebound, no ascites, no appreciable mass, anasarca Extremities: bilateral lower extremity edema to waist 3+  Skin: Negative rashes, lesions, ulcers Psychiatric:  Negative depression, negative anxiety, negative fatigue, negative mania  Central nervous system:  Cranial nerves II through XII intact, tongue/uvula midline, all extremities muscle strength 5/5, sensation intact throughout,  negative dysarthria, negative expressive aphasia, negative receptive aphasia.  .     Data Reviewed: Care during the described time interval was provided by me .  I have reviewed this patient's available data, including medical history, events of note, physical examination, and all test results as part of my evaluation.   CBC: Recent Labs  Lab 04/16/18 0602  04/19/18 0527 04/22/18 0301  WBC 6.7 8.0 5.6  HGB 10.4* 11.0* 8.2*  HCT 29.4* 31.1* 24.1*  MCV 99.3 100.0 103.0*  PLT 83* 93* 63*   Basic Metabolic Panel: Recent Labs  Lab 04/19/18 0527 04/20/18 0859 04/20/18 1751 04/21/18 0954 04/22/18 0301  NA 128* 129* 127* 129* 131*  K 5.0 5.7* 5.6* 5.6* 5.1  CL 98* 98* 96* 97* 97*  CO2 22 22 21* 23 25  GLUCOSE 63* 90 119* 84 78  BUN 48* 57* 58* 66* 71*  CREATININE 2.05* 3.31* 3.78* 4.69* 5.77*  CALCIUM 7.9* 8.1* 8.1* 7.8* 7.6*   GFR: Estimated Creatinine Clearance: 34.1 mL/min (A) (by C-G formula based on SCr of 5.77 mg/dL (H)). Liver Function Tests: Recent Labs  Lab 04/18/18 0513 04/19/18 0527 04/20/18 0859 04/21/18 0954 04/22/18 0301  AST 1,264* 1,319* 1,619* 1,091* 738*  ALT 416* 465* 654* 517* 378*  ALKPHOS 129* 132* 128* 114 101  BILITOT 6.0* 6.7* 9.1* 8.7* 8.9*  PROT 5.3* 5.2* 5.2* 4.9* 5.0*  ALBUMIN 1.5* 1.5* 1.8* 2.1* 2.6*   No results for input(s): LIPASE, AMYLASE in  the last 168 hours. No results for input(s): AMMONIA in the last 168 hours. Coagulation Profile: Recent Labs  Lab 04/21/18 0954 04/22/18 0301  INR 3.52 3.87   Cardiac Enzymes: Recent Labs  Lab 04/19/18 0527 04/20/18 0859 04/21/18 0954 04/22/18 0301  CKTOTAL 18,898* 12,136* 7,500* 4,826*   BNP (last 3 results) No results for input(s): PROBNP in the last 8760 hours. HbA1C: No results for input(s): HGBA1C in the last 72 hours. CBG: Recent Labs  Lab 04/21/18 0602 04/21/18 1138 04/21/18 1640 04/21/18 2043 04/22/18 0606  GLUCAP 92 80 92 105* 75   Lipid Profile: No results for input(s): CHOL, HDL, LDLCALC, TRIG, CHOLHDL, LDLDIRECT in the last 72 hours. Thyroid Function Tests: Recent Labs    04/19/18 1545  TSH 3.228   Anemia Panel: No results for input(s): VITAMINB12, FOLATE, FERRITIN, TIBC, IRON, RETICCTPCT in the last 72 hours. Urine analysis:    Component Value Date/Time   COLORURINE AMBER (A) 04/19/2018 1123   APPEARANCEUR  CLOUDY (A) 04/19/2018 1123   LABSPEC 1.016 04/19/2018 1123   PHURINE 5.0 04/19/2018 1123   GLUCOSEU NEGATIVE 04/19/2018 1123   HGBUR SMALL (A) 04/19/2018 1123   BILIRUBINUR NEGATIVE 04/19/2018 1123   KETONESUR NEGATIVE 04/19/2018 1123   PROTEINUR NEGATIVE 04/19/2018 1123   UROBILINOGEN 1.0 01/28/2015 1933   NITRITE NEGATIVE 04/19/2018 1123   LEUKOCYTESUR NEGATIVE 04/19/2018 1123   Sepsis Labs: @LABRCNTIP (procalcitonin:4,lacticidven:4)  ) Recent Results (from the past 240 hour(s))  MRSA PCR Screening     Status: None   Collection Time: 04/14/18  3:19 PM  Result Value Ref Range Status   MRSA by PCR NEGATIVE NEGATIVE Final    Comment:        The GeneXpert MRSA Assay (FDA approved for NASAL specimens only), is one component of a comprehensive MRSA colonization surveillance program. It is not intended to diagnose MRSA infection nor to guide or monitor treatment for MRSA infections. Performed at [facility], [place] [address], [place], [place] [address]          Radiology Studies: US Renal  Result Date: 04/21/2018 CLINICAL DATA:  Acute kidney injury. EXAM: RENAL / URINARY TRACT ULTRASOUND COMPLETE COMPARISON:  Abdominal ultrasound 10/01/2017 FINDINGS: Technically limited exam due to habitus. Right Kidney: Length: 12.5 cm. No evidence of hydronephrosis. More detailed evaluation is limited. Left Kidney: Length: 12.8 cm. No evidence of hydronephrosis. More detailed evaluation is limited. Bladder: Appears normal for degree of bladder distention. Right pleural effusion is incidentally noted. IMPRESSION: No hydronephrosis. More detailed evaluation of the kidneys is limited due to habitus. Electronically Signed   By: Jeb Levering M.D.   On: 04/21/2018 03:04        Scheduled Meds: . chlorhexidine  15 mL Mouth Rinse BID  . furosemide  80 mg Intravenous Q8H  . lactulose  30 g Oral TID  . mouth rinse  15 mL Mouth Rinse q12n4p  . midodrine  15 mg Oral TID WC   . nystatin   Topical TID  . octreotide  200 mcg Subcutaneous Q12H  . pantoprazole  80 mg Oral Daily  . penicillin v potassium  500 mg Oral BID  . rifaximin  550 mg Oral BID  . saccharomyces boulardii  250 mg Oral BID  . sertraline  50 mg Oral Daily   Continuous Infusions: . albumin human       LOS: 6 days    Time spent: 40 minutes    Mavis Fichera, Geraldo Docker, MD [facility] Pager [phone]  If 7PM-7AM, please contact night-coverage [email or web address] Password [facility] 04/22/2018, 8:27 AM

## 2018-04-22 NOTE — Procedures (Signed)
Hemodialysis Catheter Insertion Procedure Note Ryan Burgess [id number] 1971/09/21  Procedure: Insertion of Hemodialysis Catheter Indications: Hemodialysis  Procedure Details Consent: Risks of procedure as well as the alternatives and risks of each were explained to the (patient/caregiver).  Consent for procedure obtained. Time Out: Verified patient identification, verified procedure, site/side was marked, verified correct patient position, special equipment/implants available, medications/allergies/relevent history reviewed, required imaging and test results available.  Performed  Maximum sterile technique was used including antiseptics, cap, gloves, gown, hand hygiene, mask and sheet. Skin prep: Chlorhexidine; local anesthetic administered A antimicrobial bonded/coated triple lumen catheter was placed in the right internal jugular vein using the Seldinger technique.  Evaluation Blood flow good Complications: No apparent complications Patient did tolerate procedure well. Chest X-ray ordered to verify placement.  CXR: normal.  Procedure performed under direct ultrasound guidance for real time vessel cannulation.      Montey Hora, [facility] Pgr: [phone]  or [phone] 04/22/2018, 2:24 PM

## 2018-04-22 NOTE — Procedures (Signed)
[facility]  Arterial line attempt: The R radial artery had a good pulse and was visible on ultrasound.  Using ultrasound under sterile technique the artery was accessed but the wire could not pass.  Allen's test and cap refill were satisfactory.  We then looked at the R femoral artery and could visualize it.  The artery was fairly deep and the skin above the artery was quite soiled.  I determined the benefit of a femoral artery catheter was not worth the risk of placement.  Roselie Awkward, MD [facility] Pager: [phone] Cell: [phone] After 3pm or if no response, call [phone]

## 2018-04-22 NOTE — Progress Notes (Signed)
The dialysis catheter was placed by PCCM. Still anuric and has anasarca. BP remains low systolic 08-02M. Ordered CRRT. Discussed with the nurse. Needs palliative care evaluation given overall poor prognosis.

## 2018-04-22 NOTE — Progress Notes (Signed)
Patient placed on CPAP. Resting well.

## 2018-04-22 NOTE — Progress Notes (Signed)
CRITICAL VALUE ALERT  Critical Value:  CBG 69   Date & Time Notied:  17:33 04/22/2018   Provider Notified: Dr. Sherral Hammers notified   Orders Received/Actions taken:  [place] given new CBG 82,  will continue to monitor patient

## 2018-04-22 NOTE — Progress Notes (Signed)
[facility] Gastroenterology Progress Note  KRISH BAILLY 47 y.o. 1971/04/26   Subjective: Resting in bed. Denies abdominal pain. Wife at bedside. Nurses in room.  Objective: Vital signs: Vitals:   04/22/18 1031 04/22/18 1059  BP: (!) 85/36 (!) 81/32  Pulse: 68   Resp: 18 15  Temp: 98.3 F (36.8 C) 97.7 F (36.5 C)  SpO2: 93% 93%    Physical Exam: Gen: lethargic, no acute distress, morbidly obese HEENT: +icteric sclera CV: RRR Chest: CTA B Abd: minimal diffuse tenderness with guarding, soft, nondistended, +BS  Lab Results: Recent Labs    04/21/18 0954 04/22/18 0301  NA 129* 131*  K 5.6* 5.1  CL 97* 97*  CO2 23 25  GLUCOSE 84 78  BUN 66* 71*  CREATININE 4.69* 5.77*  CALCIUM 7.8* 7.6*   Recent Labs    04/21/18 0954 04/22/18 0301  AST 1,091* 738*  ALT 517* 378*  ALKPHOS 114 101  BILITOT 8.7* 8.9*  PROT 4.9* 5.0*  ALBUMIN 2.1* 2.6*   Recent Labs    04/22/18 0301  WBC 5.6  HGB 8.2*  HCT 24.1*  MCV 103.0*  PLT 63*      Assessment/Plan: Decompensated cirrhosis with worsening renal failure concerning for hepatorenal syndrome. AST/ALT improving and CK improving but poor prognosis with worsening renal failure and need for acute dialysis, worsening coags, and hypotension. Would focus on palliative measures. No new GI recs. Will sign off. Call if questions.   [place] C. 04/22/2018, 11:03 AM  Questions please call [phone] [name], male   DOB: [birth year], 47 y.o.   MRN: [id number]

## 2018-04-22 NOTE — Progress Notes (Addendum)
[facility] NEPHROLOGY PROGRESS NOTE  Assessment/ Plan: Pt is a 47 y.o. yo male  Morbidly obese, NASH liver cirrhosis, obligated by hepatic encephalopathy, chronic thrombocytopenia, chronic lower extremities edema, history of hypertension, OSA on CPAP who was treated with Valtrex for peri-orbital shingles 3 weeks prior to admission, presented with weight gain of about 30 pounds in 3 weeks associated with generalized weakness and worsening liver function.  Patient was found to have rhabdomyolysis and worsening renal failure. Transferred to [facility] on 5/20.  Assessment/Plan:  #Acute kidney injury likely multifactorial including ATN due to rhabdomyolysis, lisinopril use, poor renal perfusion in cirrhotic patient.  Other concern is hepatorenal syndrome given low urine sodium in cirrhotic patient. -Treating with octreotide, midodrine, albumin with no improvement in renal function.  Receiving IV Lasix and remains anuric for more than 48 hours.  He has anasarca.  Serum creatinine level continues to elevated.  I have discussed with the patient and his wife in detail regarding worsening renal failure and not making any urine.  The next option is to do acute dialysis treatment.  The risk of dialysis including bleeding, infection, stroke, cardiac arrest discussed with the patient and wife.  They verbalized understanding and agreed with pursuing temporary dialysis treatment.  IR is not able to place catheter because of patient's weight.  Discussed with Dr. Sherral Hammers about PCCM consult for placement of temporary catheter to initiate dialysis. May need close monitoring in ICU because of hypotension and need for RRT. -  Continue to monitor for renal recovery.  Currently on octreotide, midodrine.  Completed albumin.  Given multiple comorbidities, patient is likely a poor candidate for long-term dialysis.  I consulted palliative care to discuss goals of care.  Family agreed with the plan.  #Coagulopathy due to  liver disease: INR elevated.  Received a dose of vitamin K yesterday.  The repeat INR is 3.8.  Ordered 2 unit of FFP before the catheter placement.    #Hyperkalemia: Serum potassium level of 5.1.  Continue to monitor.  #Hyponatremia: Hypervolemic hyponatremia in the setting of liver disease.  Continue Lasix and treatment for HRS as above.  Check urine osmolality and electrolytes.  #History of hypertension: Hold lisinopril.  Blood pressure is low, currently on midodrine.  # NASH, liver disease, transaminitis: Liver enzymes are trending down.  Follow-up GI.  Subjective: Seen and examined at bedside.  Remains anuric.  Denies headache, dizziness, chest pain or shortness of breath.   Objective Vital signs in last 24 hours: Vitals:   04/21/18 2357 04/22/18 0407 04/22/18 0728 04/22/18 0802  BP: (!) 83/33 (!) 87/34 (!) 82/23 (!) 83/34  Pulse: 71 67 69   Resp: 16 18 18    Temp:   97.8 F (36.6 C)   TempSrc:   Oral   SpO2: 96% 94% 94%   Weight:  (!) 243.1 kg (536 lb)    Height:       Weight change: 0.454 kg (1 lb)  Intake/Output Summary (Last 24 hours) at 04/22/2018 1030 Last data filed at 04/22/2018 0644 Gross per 24 hour  Intake 1016 ml  Output 725 ml  Net 291 ml       Labs: Basic Metabolic Panel: Recent Labs  Lab 04/20/18 1751 04/21/18 0954 04/22/18 0301  NA 127* 129* 131*  K 5.6* 5.6* 5.1  CL 96* 97* 97*  CO2 21* 23 25  GLUCOSE 119* 84 78  BUN 58* 66* 71*  CREATININE 3.78* 4.69* 5.77*  CALCIUM 8.1* 7.8* 7.6*   Liver Function  Tests: Recent Labs  Lab 04/20/18 0859 04/21/18 0954 04/22/18 0301  AST 1,619* 1,091* 738*  ALT 654* 517* 378*  ALKPHOS 128* 114 101  BILITOT 9.1* 8.7* 8.9*  PROT 5.2* 4.9* 5.0*  ALBUMIN 1.8* 2.1* 2.6*   No results for input(s): LIPASE, AMYLASE in the last 168 hours. No results for input(s): AMMONIA in the last 168 hours. CBC: Recent Labs  Lab 04/16/18 0602 04/19/18 0527 04/22/18 0301  WBC 6.7 8.0 5.6  HGB 10.4* 11.0* 8.2*  HCT  29.4* 31.1* 24.1*  MCV 99.3 100.0 103.0*  PLT 83* 93* 63*   Cardiac Enzymes: Recent Labs  Lab 04/19/18 0527 04/20/18 0859 04/21/18 0954 04/22/18 0301  CKTOTAL 18,898* 12,136* 7,500* 4,826*   CBG: Recent Labs  Lab 04/21/18 0602 04/21/18 1138 04/21/18 1640 04/21/18 2043 04/22/18 0606  GLUCAP 92 80 92 105* 75    Iron Studies: No results for input(s): IRON, TIBC, TRANSFERRIN, FERRITIN in the last 72 hours. Studies/Results: US Renal  Result Date: 04/21/2018 CLINICAL DATA:  Acute kidney injury. EXAM: RENAL / URINARY TRACT ULTRASOUND COMPLETE COMPARISON:  Abdominal ultrasound 10/01/2017 FINDINGS: Technically limited exam due to habitus. Right Kidney: Length: 12.5 cm. No evidence of hydronephrosis. More detailed evaluation is limited. Left Kidney: Length: 12.8 cm. No evidence of hydronephrosis. More detailed evaluation is limited. Bladder: Appears normal for degree of bladder distention. Right pleural effusion is incidentally noted. IMPRESSION: No hydronephrosis. More detailed evaluation of the kidneys is limited due to habitus. Electronically Signed   By: Jeb Levering M.D.   On: 04/21/2018 03:04    Medications: Infusions: . sodium chloride    . albumin human      Scheduled Medications: . chlorhexidine  15 mL Mouth Rinse BID  . furosemide  80 mg Intravenous Q8H  . lactulose  30 g Oral TID  . mouth rinse  15 mL Mouth Rinse q12n4p  . midodrine  15 mg Oral TID WC  . nystatin   Topical TID  . octreotide  200 mcg Subcutaneous Q12H  . pantoprazole  80 mg Oral Daily  . penicillin v potassium  500 mg Oral BID  . rifaximin  550 mg Oral BID  . saccharomyces boulardii  250 mg Oral BID  . sertraline  50 mg Oral Daily    have reviewed scheduled and prn medications.  Physical Exam: General: Obese male lying in bed, not in distress Heart: Regular rate rhythm S1-S2 normal, no rubs Lungs: Distant breath sound Abdomen: Soft, nontender, obese Extremities: Lateral lower extremities  pitting edema.  Ryan Burgess 04/22/2018,10:30 AM  LOS: 6 days

## 2018-04-22 NOTE — Progress Notes (Signed)
Assessed patient at the request of bedside RN. Reviewed chart, labs, and notes. Assessed patient. Lungs diminished, but clear. Patient sleepy, but awakens easily and is oriented. Patient hypotensive and c/o feeling weak. Patient has orders to receive FFP to decrease INR for temp HD catheter placement and albumin for hypotension. Lab trends concerning at this time for need for HD, but blood pressure does not appear to be able to tolerate. Paged attending to discuss potential need to transfer to ICU for CRRT. Awaiting return call.

## 2018-04-22 NOTE — Progress Notes (Signed)
For patient safety, cannot place dialysis catheter in IR with current equipment limitations.   Order cancelled.  Ordering service made aware.  Brynda Greathouse, MS RD PA-C 9:29 AM

## 2018-04-23 ENCOUNTER — Encounter: Payer: BLUE CROSS/BLUE SHIELD | Admitting: Occupational Therapy

## 2018-04-23 DIAGNOSIS — Z515 Encounter for palliative care: Secondary | ICD-10-CM

## 2018-04-23 DIAGNOSIS — N179 Acute kidney failure, unspecified: Secondary | ICD-10-CM

## 2018-04-23 LAB — PREPARE FRESH FROZEN PLASMA: Unit division: 0

## 2018-04-23 LAB — COMPREHENSIVE METABOLIC PANEL
ALT: 334 U/L — ABNORMAL HIGH (ref 17–63)
ANION GAP: 10 (ref 5–15)
AST: 605 U/L — ABNORMAL HIGH (ref 15–41)
Albumin: 3.1 g/dL — ABNORMAL LOW (ref 3.5–5.0)
Alkaline Phosphatase: 111 U/L (ref 38–126)
BUN: 70 mg/dL — ABNORMAL HIGH (ref 6–20)
CHLORIDE: 98 mmol/L — AB (ref 101–111)
CO2: 25 mmol/L (ref 22–32)
Calcium: 7.5 mg/dL — ABNORMAL LOW (ref 8.9–10.3)
Creatinine, Ser: 5.99 mg/dL — ABNORMAL HIGH (ref 0.61–1.24)
GFR, EST AFRICAN AMERICAN: 12 mL/min — AB (ref >60)
GFR, EST NON AFRICAN AMERICAN: 10 mL/min — AB (ref >60)
Glucose, Bld: 86 mg/dL (ref 65–99)
POTASSIUM: 4.3 mmol/L (ref 3.5–5.1)
SODIUM: 133 mmol/L — AB (ref 135–145)
Total Bilirubin: 10.1 mg/dL — ABNORMAL HIGH (ref 0.3–1.2)
Total Protein: 5.8 g/dL — ABNORMAL LOW (ref 6.5–8.1)

## 2018-04-23 LAB — BPAM FFP
BLOOD PRODUCT EXPIRATION DATE: 201905252359
Blood Product Expiration Date: 201905252359
ISSUE DATE / TIME: 201905221030
ISSUE DATE / TIME: 201905221323
UNIT TYPE AND RH: 6200
Unit Type and Rh: 6200

## 2018-04-23 LAB — GLUCOSE, CAPILLARY
GLUCOSE-CAPILLARY: 84 mg/dL (ref 65–99)
Glucose-Capillary: 77 mg/dL (ref 65–99)
Glucose-Capillary: 70 mg/dL (ref 65–99)
Glucose-Capillary: 92 mg/dL (ref 65–99)
Glucose-Capillary: 91 mg/dL (ref 65–99)
Glucose-Capillary: 99 mg/dL (ref 65–99)

## 2018-04-23 LAB — RENAL FUNCTION PANEL
ALBUMIN: 3.1 g/dL — AB (ref 3.5–5.0)
ALBUMIN: 2.9 g/dL — AB (ref 3.5–5.0)
ANION GAP: 11 (ref 5–15)
Anion gap: 11 (ref 5–15)
BUN: 68 mg/dL — ABNORMAL HIGH (ref 6–20)
BUN: 59 mg/dL — ABNORMAL HIGH (ref 6–20)
CALCIUM: 7.5 mg/dL — AB (ref 8.9–10.3)
CALCIUM: 7.7 mg/dL — AB (ref 8.9–10.3)
CO2: 23 mmol/L (ref 22–32)
CO2: 23 mmol/L (ref 22–32)
Chloride: 97 mmol/L — ABNORMAL LOW (ref 101–111)
Chloride: 98 mmol/L — ABNORMAL LOW (ref 101–111)
Creatinine, Ser: 5.75 mg/dL — ABNORMAL HIGH (ref 0.61–1.24)
Creatinine, Ser: 5.17 mg/dL — ABNORMAL HIGH (ref 0.61–1.24)
GFR calc Af Amer: 14 mL/min — ABNORMAL LOW (ref >60)
GFR calc non Af Amer: 12 mL/min — ABNORMAL LOW (ref >60)
GFR, EST AFRICAN AMERICAN: 12 mL/min — AB (ref >60)
GFR, EST NON AFRICAN AMERICAN: 11 mL/min — AB (ref >60)
Glucose, Bld: 83 mg/dL (ref 65–99)
Glucose, Bld: 118 mg/dL — ABNORMAL HIGH (ref 65–99)
PHOSPHORUS: 6.9 mg/dL — AB (ref 2.5–4.6)
PHOSPHORUS: 6.2 mg/dL — AB (ref 2.5–4.6)
POTASSIUM: 4.3 mmol/L (ref 3.5–5.1)
Potassium: 4.2 mmol/L (ref 3.5–5.1)
SODIUM: 131 mmol/L — AB (ref 135–145)
Sodium: 132 mmol/L — ABNORMAL LOW (ref 135–145)

## 2018-04-23 LAB — MAGNESIUM: MAGNESIUM: 2.4 mg/dL (ref 1.7–2.4)

## 2018-04-23 MED ORDER — PRISMASOL BGK 4/2.5 32-4-2.5 MEQ/L IV SOLN
INTRAVENOUS | Status: DC
  Administered 2018-04-23 – 2018-04-24 (×11): via INTRAVENOUS_CENTRAL
  Filled 2018-04-23 (×16): qty 5000

## 2018-04-23 NOTE — Progress Notes (Signed)
PCCM Interval Progress Note  Called by bedside RN due to refractory shock despite 48mcg/min Levophed and net even on CRRT. Levophed increased to 58mcg/min but unfortunately minimal improvement in BP (MAP's low to mid 50s).  Given decline in condition and now worsening shock and multi organ failure, family has opted to change code status to DNR. The family has decided not to perform resuscitation if arrest were to occur, but to otherwise continue with current medical support / therapies.  RN updated.  Levophed to remain at current dose with no escalation.  Palliative care following, appreciate the assistance.   Montey Hora, [facility] Pager: [phone]  or [phone] 04/23/2018, 6:22 PM

## 2018-04-23 NOTE — Consult Note (Signed)
Consultation Note Date: 04/23/2018   Patient Name: Ryan Burgess  DOB: 11-Dec-1970  MRN: [id number]  Age / Sex: 47 y.o., male  PCP: Esaw Grandchild, NP Referring Physician: Allie Bossier, MD  Reason for Consultation: Establishing goals of care and Terminal Care  HPI/Patient Profile: 47 y.o. male  with past medical history of NASH cirrhosis, hepatic encephalopathy, obesity, OSA with CPAP, HTN, HLD, GERD, cellulitis admitted on 04/19/2018 with abdominal swelling and BLE swelling. Presented with weight gain of about 30 lbs in 3 weeks and worsening liver function. Patient recently diagnosed with shingles and started on Valtrex three weeks prior to admission. In ED, patient found to have rhabdomyolysis and worsening renal failure. Nephrology following for acute kidney injury of multifactorial etiology including ATN secondary to rhabdomyolysis, lisinopril use, poor renal perfusion with cirrhosis, and/or hepatorenal syndrome. On 5/22, patient transferred to ICU for CRRT initiation. Also with hypotension requiring pressor support. Per nephrology, patient is a poor candidate for long-term dialysis. GI following. Patient is not a candidate for liver transplant and recommending palliative measures. Palliative medicine consultation for goals of care/terminal care.   Clinical Assessment and Goals of Care: I have reviewed medical records, discussed with care team, and met with patient, wife Ryan Burgess) and sister Ryan Burgess) to discuss diagnosis, prognosis, GOC, and EOL wishes. Patient wakes to voice and will answer questions but remains drowsy. Ryan Burgess, and I discussed goals of care in conference room.   Introduced Palliative Medicine as specialized medical care for people living with serious illness. It focuses on providing relief from the symptoms and stress of a serious illness. The goal is to improve quality of life for both  the patient and the family.  We discussed a brief life review of the patient. Ryan Burgess, and Ryan Burgess have been married for 20 years. They have two children, age 4 and 49. Ryan Burgess is a very intelligent man that has a Master's degree in biochemistry. Ryan Burgess shares that he was laid off from his job after being sick in January 2018 from cellulitis. He has not worked since then and has been depressed. Functionally, he was able to perform ADL's with minimal assist prior to admission. Diagnosed with cirrhosis about 2 years ago and followed by outpatient GI.   Discussed hospital diagnoses and interventions. Ryan Burgess and Ryan Burgess are very tearful throughout the conversation. They appreciate honest conversations with Dr. Carolin Sicks and hospitalists regarding tenuous clinical condition and poor prognosis. They understand his clinical condition has greatly declined in the last 24 hours, now on CRRT and requiring pressors. They understand high risk for decompensation leading to cardiac arrest.  Advanced directives, concepts specific to code status, and artifical feeding and hydration were discussed. Ryan Burgess does NOT have a documented living will or HCPOA. Ryan Burgess shares a conversation they had on Tuesday regarding heroic interventions/code status. Ryan Burgess states "He didn't want a DNR" and "doesn't want to be cremated." Ryan Burgess spoke of his wishes for everything to be tried, including resuscitation if his heart stopped.   Educated on recommendation  for DNR/DNI with multiorgan failure, underlying co-morbidities, poor chance of survival and/or meaningful recovery with CPR. Ryan Burgess states "we get it" and explain their understanding that this would be more harm than good to him. Ryan Burgess struggles with not honoring his wish, that he just spoke of two days ago. Ryan Burgess and [place] feel he doesn't understand that CPR will likely not enhance his "quality of life." Hard Choices copy given. Carrie plans to further discuss with him this  afternoon/evening if he has a moment of lucidity. I also explained that we can discuss together with him if that would be helpful.  I explored how Ryan Burgess is coping. Ryan Burgess tells me he seems to understand how sick he is but "jokes around" as a way of coping.   After discussions with Dr. Carolin Sicks, Ryan Burgess and Ryan Burgess wish to continue time trial of CRRT (3-5 days). "Hoping and praying for the best" but also very realistic that they need to "prepare for the worst." Ryan Burgess tells me if his kidneys do not bounce back in the next few days, "there is nothing else they can do." She understands he is not a candidate for long-term dialysis. Also not a candidate for liver transplant. Ryan Burgess and Ryan Burgess also share that they have been worried about his health for many years, but still in "shock" that his health has declined so quickly.   Ryan Burgess is very worried about her children. We discussed reaching out to [facility] through [facility] for pediatric counseling. Ryan Burgess is agreeable.   Ryan Burgess states that "he doesn't want to die" but also will be at peace "knowing where he is going" (heaven). Pastor visits daily.   Therapeutic listening. Emotional/spiritual support provided. PMT contact information given.     SUMMARY OF RECOMMENDATIONS    Initial palliative discussion with patient's wife and sister.   Family has a good understanding of hospital diagnoses, interventions, and poor prognosis. They understand he is NOT a candidate for long-term dialysis and liver transplant. They understand he is critically ill.   Wife struggles with decision for DNR because the patient told her two days ago he "didn't want a DNR" and wished for resuscitation to be attempted if his heart stopped. Hard Choices copy given. Wife plans to further discuss code status with patient today, hoping he will be awake/lucid to discuss with her.   Continue time trial of CRRT per nephrology. Wife and sister are "hoping and praying for the best" but also seem  realistic with poor prognosis and need to "prepare for the worst."   [facility]: [facility] [phone]  PMT will follow for ongoing [place] conversations.  Code Status/Advance Care Planning:  Full code-educated on medical recommendation for DNR/DNI with multiorgan failure, underlying co-morbidities, poor outcomes of CPR/chance of meaningful survival  Symptom Management:   Per attending  Palliative Prophylaxis:   Aspiration, Delirium Protocol, Frequent Pain Assessment, Oral Care and Turn Reposition  Additional Recommendations (Limitations, Scope, Preferences):  Full Scope Treatment  Psycho-social/Spiritual:   Desire for further Chaplaincy support:yes  Additional Recommendations: Caregiving  Support/Resources and Education on Hospice  Prognosis:   Poor prognosis with multiorgan failure with underlying decompensating NASH cirrhosis, acute renal failure requiring CRRT, and hypotension on pressors. High risk for clinical decompensation.  Discharge Planning: To Be Determined      Primary Diagnoses: Present on Admission: . Cirrhosis ([place]) . HTN (hypertension) . NASH (nonalcoholic steatohepatitis) . Acute hepatitis   I have reviewed the medical record, interviewed the patient and family, and examined  the patient. The following aspects are pertinent.  Past Medical History:  Diagnosis Date  . Cellulitis   . Cirrhosis, nonalcoholic ([place])   . Dyspnea    with walking   . GERD (gastroesophageal reflux disease)   . Headache    "maybe monthly" (12/19/2016)  . Hepatic encephalopathy ([place]) ~ 2016 X 2   "first time they thought it was a TIA; dx'd hepatic encephalopathy the 2nd time it happened"  . High cholesterol   . Hypertension   . Kidney stones 01/2015   UTI  . Migraine    "none in years; used to have them frequently" (12/19/2016)  . Morbid obesity with body mass index of 50.0-59.9 in adult [facility]) 01/25/2015  . OSA on CPAP   . Pre-diabetes     . Snoring   . Venous stasis    Social History   Socioeconomic History  . Marital status: Married    Spouse name: Ryan Burgess  . Number of children: 2  . Years of education: masters  . Highest education level: Not on file  Occupational History  . Not on file  Social Needs  . Financial resource strain: Not on file  . Food insecurity:    Worry: Not on file    Inability: Not on file  . Transportation needs:    Medical: Not on file    Non-medical: Not on file  Tobacco Use  . Smoking status: Never Smoker  . Smokeless tobacco: Never Used  Substance and Sexual Activity  . Alcohol use: Yes    Alcohol/week: 0.6 - 1.2 oz    Types: 1 - 2 Standard drinks or equivalent per week    Comment: once a year  . Drug use: No  . Sexual activity: Yes    Birth control/protection: None  Lifestyle  . Physical activity:    Days per week: Not on file    Minutes per session: Not on file  . Stress: Not on file  Relationships  . Social connections:    Talks on phone: Not on file    Gets together: Not on file    Attends religious service: Not on file    Active member of club or organization: Not on file    Attends meetings of clubs or organizations: Not on file    Relationship status: Not on file  Other Topics Concern  . Not on file  Social History Narrative   Patient lives at home with his wife Ryan Burgess) and two children.    Patient works full time at [employer] in [place].   Right handed.   Caffeine 1-2 sodas and coffee daily.   Family History  Problem Relation Age of Onset  . Leukemia Mother   . Diabetes Father   . Heart disease Maternal Grandmother   . Heart disease Paternal Grandmother   . Dementia Paternal Grandmother   . Diabetes Paternal Grandmother   . Frontotemporal dementia Paternal Grandmother   . COPD Maternal Grandfather   . Heart disease Paternal Grandfather   . Diabetes Paternal Grandfather    Scheduled Meds: . chlorhexidine   15 mL Mouth Rinse BID  . lactulose  30 g Oral TID  . mouth rinse  15 mL Mouth Rinse q12n4p  . midodrine  15 mg Oral TID WC  . nystatin   Topical TID  . pantoprazole  80 mg Oral Daily  . rifaximin  550 mg Oral BID  . saccharomyces boulardii  250 mg  Oral BID  . sertraline  50 mg Oral Daily   Continuous Infusions: . sodium chloride    . norepinephrine (LEVOPHED) Adult infusion 40 mcg/min (04/23/18 1217)  . dialysis replacement fluid (prismasate) 400 mL/hr at 04/23/18 1222  . dialysis replacement fluid (prismasate) 300 mL/hr at 04/23/18 0817  . dialysate (PRISMASATE) 2,500 mL/hr at 04/23/18 1323  . sodium chloride     PRN Meds:.Place/Maintain arterial line **AND** sodium chloride, fentaNYL (SUBLIMAZE) injection, heparin, loratadine, ondansetron **OR** ondansetron (ZOFRAN) IV, sodium chloride Medications Prior to Admission:  Prior to Admission medications   Medication Sig Start Date End Date Taking? Authorizing Provider  aspirin EC 81 MG EC tablet Take 1 tablet (81 mg total) by mouth daily. 01/10/15  Yes Rai, Ripudeep K, MD  atorvastatin (LIPITOR) 20 MG tablet TAKE 1 TABLET BY MOUTH AT BEDTIME 02/26/18  Yes Danford, Katy D, NP  DEXILANT 60 MG capsule TAKE 1 CAPSULE BY MOUTH DAILY. 03/12/18  Yes Danford, Valetta Fuller D, NP  ferrous sulfate 325 (65 FE) MG EC tablet Take 325 mg by mouth daily with breakfast.   Yes [provider]  furosemide (LASIX) 40 MG tablet Take 1 tablet (40 mg total) by mouth daily. Patient taking differently: Take 40 mg by mouth 2 (two) times daily.  04/06/18  Yes Danford, Valetta Fuller D, NP  lactulose, encephalopathy, (CHRONULAC) 10 GM/15ML SOLN Take 10 g by mouth 2 (two) times daily as needed (constipation). Take 45 mls by mouth 3 times a day 04/24/16  Yes [provider]  lisinopril (PRINIVIL,ZESTRIL) 10 MG tablet Take 10 mg by mouth daily.   Yes [provider]  penicillin v potassium (VEETID) 500 MG tablet Take 500 mg by mouth 2 (two) times daily.   Yes  [provider]  rifaximin (XIFAXAN) 550 MG TABS tablet Take 1 tablet (550 mg total) by mouth 2 (two) times daily. 06/14/16  Yes Geronimo Boot, MD  saccharomyces boulardii (FLORASTOR) 250 MG capsule Take 1 capsule (250 mg total) by mouth 2 (two) times daily. 01/27/17  Yes Danford, Valetta Fuller D, NP  sertraline (ZOLOFT) 50 MG tablet Take 1 tablet (50 mg total) by mouth daily. 01/13/18  Yes Danford, Berna Spare, NP  blood glucose meter kit and supplies KIT Dispense based on patient and insurance preference. Use up to four times daily as directed. (FOR ICD-9 250.00, 250.01). 01/11/15   Rai, Ripudeep K, MD  ferrous sulfate 325 (65 FE) MG EC tablet Take 1 tablet (325 mg total) by mouth daily with breakfast. 02/12/18 03/14/18  Ardath Sax, MD  loratadine (CLARITIN) 10 MG tablet Take 10 mg by mouth daily as needed for allergies.    [provider]  Vitamin D, Ergocalciferol, (DRISDOL) 50000 units CAPS capsule TAKE 1 CAPSULE BY MOUTH EVERY 7 DAYS. 01/12/18   Danford, Valetta Fuller D, NP   No Known Allergies Review of Systems  Unable to perform ROS: Acuity of condition   Physical Exam  Constitutional: He is easily aroused. He appears ill.  HENT:  Head: Normocephalic and atraumatic.  Cardiovascular: Regular rhythm.  Pulmonary/Chest: No accessory muscle usage. No tachypnea. No respiratory distress.  Neurological: He is easily aroused.  Wakes to voice, drowsy  Skin: Skin is warm and dry.  Psychiatric: He is inattentive.  Nursing note and vitals reviewed.  Vital Signs: BP (!) 102/36   Pulse 74   Temp (!) 97.3 F (36.3 C) (Oral)   Resp 18   Ht _0  (1.956 m)   Wt (!) 246.3 kg (543 lb)  SpO2 95%   BMI 64.39 kg/m  Pain Scale: 0-10   Pain Score: 0-No pain   SpO2: SpO2: 95 % O2 Device:SpO2: 95 % O2 Flow Rate: .O2 Flow Rate (L/min): 2 L/min  IO: Intake/output summary:   Intake/Output Summary (Last 24 hours) at 04/23/2018 1326 Last data filed at 04/23/2018 1300 Gross per 24 hour  Intake  1054.36 ml  Output 1674 ml  Net -619.64 ml    LBM: Last BM Date: 04/22/18 Baseline Weight: Weight: (!) 244 kg (538 lb) Most recent weight: Weight: (!) 246.3 kg (543 lb)     Palliative Assessment/Data: PPS 30%   Flowsheet Rows     Most Recent Value  Intake Tab  Referral Department  Hospitalist  Unit at Time of Referral  Med/Surg Unit  Palliative Care Primary Diagnosis  -- [cirrhosis/ARF on CRRT]  Palliative Care Type  New Palliative care  Reason for referral  Clarify Goals of Care  Date first seen by Palliative Care  04/23/18  Clinical Assessment  Palliative Performance Scale Score  30%  Psychosocial & Spiritual Assessment  Palliative Care Outcomes  Patient/Family meeting held?  Yes  Who was at the meeting?  patient, wife, sister  Palliative Care Outcomes  Clarified goals of care, ACP counseling assistance, Provided end of life care assistance, Provided psychosocial or spiritual support, Counseled regarding hospice      Time In: 1150 Time Out: 1300 Time Total: 97mn Greater than 50%  of this time was spent counseling and coordinating care related to the above assessment and plan.  Signed by:  MIhor Dow FNP-C Palliative Medicine Team  Phone: [phone]: [phone]  Please contact Palliative Medicine Team phone at [phone] questions and concerns.  For individual provider: See AShea Evans

## 2018-04-23 NOTE — Progress Notes (Signed)
Placed patient on CPAP for the night via auto-mode. Oxygen set at 2lpm. 

## 2018-04-23 NOTE — Progress Notes (Signed)
PULMONARY / CRITICAL CARE MEDICINE   Name: Ryan Burgess MRN: [id number] DOB: June 01, 1971    ADMISSION DATE:  04/26/2018 CONSULTATION DATE:  04/22/2018   REFERRING MD:  Sherral Hammers  CHIEF COMPLAINT:  Hypotension, renal failure  HISTORY OF PRESENT ILLNESS:   47 y/o male with NASH cirrhosis was admitted to [facility] in the setting of worsening renal failure.  He has been followed here by hepatology and the renal team.  He has been given midodrine and octreotide here but despite that his renal function has worsened.  He was moved to the ICU for CRRT. He has been hypotensive here, baseline systolic blood pressure is 140.  In the ICU he was noted to be profoundly hypotensive.  Attempts were made a placing an arterial line but were unsuccessful.    SUBJECTIVE:  Some short runs of Vtach this AM.  Morning labs OK. Remains on levophed at 64mcg/min.  CRRT ongoing, 900cc pulled overnight.  VITAL SIGNS: BP (!) 90/26   Pulse 76   Temp 98.3 F (36.8 C) (Axillary)   Resp 18   Ht 6\' 5"  (1.956 m)   Wt (!) 246.3 kg (543 lb)   SpO2 (!) 87%   BMI 64.39 kg/m   HEMODYNAMICS:    VENTILATOR SETTINGS:    INTAKE / OUTPUT: I/O last 3 completed shifts: In: 1516.4 [P.O.:686; I.V.:318.4; Blood:232; Other:80; IV Piggyback:200] Out: 1475 [Urine:40; Other:934; Stool:501]  PHYSICAL EXAMINATION:  General: Obese male resting comfortably in bed, in NAD HENT: NCAT OP clear PULM: normal effort, CTAB CV: RRR, cap refill normal, palpable radial pulses GI: soft, nontender MSK: normal bulk and tone Neuro: drowsy but awake, conversant   LABS:  BMET Recent Labs  Lab 04/21/18 0954 04/22/18 0301 04/23/18 0319  NA 129* 131* 133*  131*  K 5.6* 5.1 4.3  4.3  CL 97* 97* 98*  97*  CO2 23 25 25  23   BUN 66* 71* 70*  68*  CREATININE 4.69* 5.77* 5.99*  5.75*  GLUCOSE 84 78 86  83    Electrolytes Recent Labs  Lab 04/21/18 0954 04/22/18 0301 04/23/18 0319  CALCIUM 7.8* 7.6* 7.5*  7.5*  MG  --   --   2.4  PHOS  --   --  6.9*    CBC Recent Labs  Lab 04/19/18 0527 04/22/18 0301  WBC 8.0 5.6  HGB 11.0* 8.2*  HCT 31.1* 24.1*  PLT 93* 63*    Coag's Recent Labs  Lab 04/21/18 0954 04/22/18 0301  INR 3.52 3.87    Sepsis Markers No results for input(s): LATICACIDVEN, PROCALCITON, O2SATVEN in the last 168 hours.  ABG No results for input(s): PHART, PCO2ART, PO2ART in the last 168 hours.  Liver Enzymes Recent Labs  Lab 04/21/18 0954 04/22/18 0301 04/23/18 0319  AST 1,091* 738* 605*  ALT 517* 378* 334*  ALKPHOS 114 101 111  BILITOT 8.7* 8.9* 10.1*  ALBUMIN 2.1* 2.6* 3.1*  3.1*    Cardiac Enzymes No results for input(s): TROPONINI, PROBNP in the last 168 hours.  Glucose Recent Labs  Lab 04/22/18 1210 04/22/18 1629 04/22/18 1900 04/22/18 1927 04/22/18 2320 04/23/18 0329  GLUCAP 72 69 83 81 72 77    Imaging Dg Chest Portable 1 View  Result Date: 04/22/2018 CLINICAL DATA:  Status post central line placement today. EXAM: PORTABLE CHEST 1 VIEW COMPARISON:  PA and lateral chest 11/18/2017. FINDINGS: New right IJ approach dialysis catheter is in place with the tip projecting in the mid superior vena cava. No pneumothorax.  There is cardiomegaly and pulmonary edema. No definite pleural effusion. IMPRESSION: Right IJ catheter tip projects in the mid superior vena cava. Negative for pneumothorax. Cardiomegaly and pulmonary edema. Electronically Signed   By: Inge Rise M.D.   On: 04/22/2018 14:21     LINES/TUBES: 5/22 R IJ HD cath   DISCUSSION: 47 year old male with a past medical history significant for nonalcoholic  steatohepatitis related cirrhosis here with worsening renal failure likely due to hepatorenal syndrome.  At baseline he has morbid obesity, history of hepatic encephalopathy.  Overall prognosis is poor.  Moved to the ICU for vasopressor support and CVVHD.  ASSESSMENT / PLAN:  PULMONARY A: No acute issues P:   Monitor respiratory  status  CARDIOVASCULAR A:  Hypotension in setting of advanced cirrhosis, likely due to increase splancnic blood flow NSVT - AM labs normal P:  Continue Levophed for mean arterial pressure greater than 85 Maintain K ~ 4, Mg ~ 2 EKG PRN If persists, will ask cards to see Telemetry monitoring  RENAL A:   Hepatorenal syndrome Rhabdomyolysis Hyponatremia - hypervolemic P:   Continue norepinephrine, octreotide, midodrine CVVHD per renal  GASTROINTESTINAL A:   Advanced cirrhosis, poor prognosis Hepatic encephalopathy P:   GI following Palliative care consult ordered by TRH Continue rifaximin, lactulose Diet as tolerated  HEMATOLOGIC A:   Chronic thrombocytopenia P:  Monitor for bleeding  INFECTIOUS A:   No acute issues P:   Monitor for fever  ENDOCRINE A:   No acute issues P:   Monitor glucose  NEUROLOGIC A:   History of hepatic encephalopathy P:   No sedating medications Continue rifaximin, lactulose  FAMILY  - Updates: None bedside  - Inter-disciplinary family meet or Palliative Care meeting due by: Would pursue ongoing conversation about goals of care as overall prognosis here is dismal.  Palliative care has been consulted by primary team.   CC time: 30 min.   Montey Hora, [place] [facility] Pager: [phone]  or [phone] 04/23/2018, 7:39 AM

## 2018-04-23 NOTE — Progress Notes (Signed)
[facility] NEPHROLOGY PROGRESS NOTE  Assessment/ Plan: Pt is a 47 y.o. yo male  Morbidly obese, NASH liver cirrhosis, obligated by hepatic encephalopathy, chronic thrombocytopenia, chronic lower extremities edema, history of hypertension, OSA on CPAP who was treated with Valtrex for peri-orbital shingles 3 weeks prior to admission, presented with weight gain of about 30 pounds in 3 weeks associated with generalized weakness and worsening liver function.  Patient was found to have rhabdomyolysis and worsening renal failure. Transferred to [facility] on 5/20.  Assessment/Plan:  #Acute kidney injury likely multifactorial including ATN due to rhabdomyolysis, lisinopril use, poor renal perfusion in cirrhotic patient.  Other concern is hepatorenal syndrome given low urine sodium in cirrhotic patient. -Treating with octreotide, midodrine, albumin with no improvement in renal function.  -Patient remained anuric with continue to elevated serum creatinine level.  Mild hyperkalemia.  Remained hypotensive.  After discussion with the patient and family, temporary catheter was placed and he started on CRRT on 5/22.  He is on Levophed currently.  I will discontinue octreotide and Lasix.  Given multiple comorbidities, poor hepatic function, patient is a poor candidate for long-term dialysis.  Plan for meeting with palliative care today.  I have been discussing with the patient and family regarding the plan of care and updates daily. -Currently on 0 potassium in prefilter and post filter.  Change to 4K bath and dialysis fluid.  Urine output of 40 cc in 24 hours.  #Coagulopathy due to liver disease: Received FFP before catheter placement.  No sign of bleeding.   #Hyperkalemia: Serum potassium level improved.  Continue to monitor.  #Hyponatremia: Hypervolemic hyponatremia in the setting of liver disease.  Monitor labs.  #History of hypertension: Currently hypotensive on pressors.  Off lisinopril.  # NASH,  liver disease, transaminitis: Liver enzymes are trending down.  Follow-up GI.  Subjective: Seen and examined at bedside.  Remains anuric.  Tolerating CRRT with Levophed support.  Discussed with the patient and wife.  Patient denies chest pain, shortness of breath, nausea vomiting. Objective Vital signs in last 24 hours: Vitals:   04/23/18 0830 04/23/18 0845 04/23/18 0900 04/23/18 0915  BP:  (!) 97/31 (!) 95/32 (!) 98/34  Pulse: 74 74 75 78  Resp: 16 18 16 18   Temp:      TempSrc:      SpO2: 96% 96% 96% 95%  Weight:      Height:       Weight change: 3.175 kg (7 lb)  Intake/Output Summary (Last 24 hours) at 04/23/2018 0940 Last data filed at 04/23/2018 0900 Gross per 24 hour  Intake 781.04 ml  Output 1187 ml  Net -405.96 ml       Labs: Basic Metabolic Panel: Recent Labs  Lab 04/21/18 0954 04/22/18 0301 04/23/18 0319  NA 129* 131* 133*  131*  K 5.6* 5.1 4.3  4.3  CL 97* 97* 98*  97*  CO2 23 25 25  23   GLUCOSE 84 78 86  83  BUN 66* 71* 70*  68*  CREATININE 4.69* 5.77* 5.99*  5.75*  CALCIUM 7.8* 7.6* 7.5*  7.5*  PHOS  --   --  6.9*   Liver Function Tests: Recent Labs  Lab 04/21/18 0954 04/22/18 0301 04/23/18 0319  AST 1,091* 738* 605*  ALT 517* 378* 334*  ALKPHOS 114 101 111  BILITOT 8.7* 8.9* 10.1*  PROT 4.9* 5.0* 5.8*  ALBUMIN 2.1* 2.6* 3.1*  3.1*   No results for input(s): LIPASE, AMYLASE in the last 168 hours. No  results for input(s): AMMONIA in the last 168 hours. CBC: Recent Labs  Lab 04/19/18 0527 04/22/18 0301  WBC 8.0 5.6  HGB 11.0* 8.2*  HCT 31.1* 24.1*  MCV 100.0 103.0*  PLT 93* 63*   Cardiac Enzymes: Recent Labs  Lab 04/19/18 0527 04/20/18 0859 04/21/18 0954 04/22/18 0301  CKTOTAL 18,898* 12,136* 7,500* 4,826*   CBG: Recent Labs  Lab 04/22/18 1900 04/22/18 1927 04/22/18 2320 04/23/18 0329 04/23/18 0821  GLUCAP 83 81 72 77 70    Iron Studies: No results for input(s): IRON, TIBC, TRANSFERRIN, FERRITIN in the last  72 hours. Studies/Results: Dg Chest Portable 1 View  Result Date: 04/22/2018 CLINICAL DATA:  Status post central line placement today. EXAM: PORTABLE CHEST 1 VIEW COMPARISON:  PA and lateral chest 11/18/2017. FINDINGS: New right IJ approach dialysis catheter is in place with the tip projecting in the mid superior vena cava. No pneumothorax. There is cardiomegaly and pulmonary edema. No definite pleural effusion. IMPRESSION: Right IJ catheter tip projects in the mid superior vena cava. Negative for pneumothorax. Cardiomegaly and pulmonary edema. Electronically Signed   By: Inge Rise M.D.   On: 04/22/2018 14:21    Medications: Infusions: . sodium chloride    . norepinephrine (LEVOPHED) Adult infusion 36 mcg/min (04/23/18 0926)  . dialysis replacement fluid (prismasate) 400 mL/hr at 04/23/18 0736  . dialysis replacement fluid (prismasate) 300 mL/hr at 04/23/18 0817  . dialysate (PRISMASATE)    . sodium chloride      Scheduled Medications: . chlorhexidine  15 mL Mouth Rinse BID  . lactulose  30 g Oral TID  . mouth rinse  15 mL Mouth Rinse q12n4p  . midodrine  15 mg Oral TID WC  . nystatin   Topical TID  . pantoprazole  80 mg Oral Daily  . rifaximin  550 mg Oral BID  . saccharomyces boulardii  250 mg Oral BID  . sertraline  50 mg Oral Daily    have reviewed scheduled and prn medications.  Physical Exam: General: Obese male lying in bed, not in distress, neck temporary catheter site looks clean with no sign of bleeding. Heart: Regular rate rhythm S1-S2 normal, no rubs Lungs: Distant breath sound Abdomen: Soft, nontender, obese Extremities: Lateral lower extremities pitting edema.  Unchanged  Donnesha Karg Prasad Jasaiah Karwowski 04/23/2018,9:40 AM  LOS: 7 days

## 2018-04-23 NOTE — Progress Notes (Signed)
Paged [facility] on call to notify about patient having short runs of vtach this AM. Patient vitals stable, A/Ox4.   Will continue to monitor.   Ryan Burgess, [place]

## 2018-04-24 LAB — GLUCOSE, CAPILLARY
GLUCOSE-CAPILLARY: 114 mg/dL — AB (ref 65–99)
GLUCOSE-CAPILLARY: 123 mg/dL — AB (ref 65–99)
GLUCOSE-CAPILLARY: 99 mg/dL (ref 65–99)
Glucose-Capillary: 91 mg/dL (ref 65–99)
Glucose-Capillary: 105 mg/dL — ABNORMAL HIGH (ref 65–99)
Glucose-Capillary: 101 mg/dL — ABNORMAL HIGH (ref 65–99)

## 2018-04-24 LAB — RENAL FUNCTION PANEL
ANION GAP: 10 (ref 5–15)
Albumin: 2.6 g/dL — ABNORMAL LOW (ref 3.5–5.0)
BUN: 44 mg/dL — ABNORMAL HIGH (ref 6–20)
CHLORIDE: 98 mmol/L — AB (ref 101–111)
CO2: 24 mmol/L (ref 22–32)
Calcium: 8.1 mg/dL — ABNORMAL LOW (ref 8.9–10.3)
Creatinine, Ser: 4.37 mg/dL — ABNORMAL HIGH (ref 0.61–1.24)
GFR calc non Af Amer: 15 mL/min — ABNORMAL LOW (ref >60)
GFR, EST AFRICAN AMERICAN: 17 mL/min — AB (ref >60)
GLUCOSE: 103 mg/dL — AB (ref 65–99)
POTASSIUM: 4.9 mmol/L (ref 3.5–5.1)
Phosphorus: 5.9 mg/dL — ABNORMAL HIGH (ref 2.5–4.6)
Sodium: 132 mmol/L — ABNORMAL LOW (ref 135–145)

## 2018-04-24 LAB — PHOSPHORUS: PHOSPHORUS: 5.9 mg/dL — AB (ref 2.5–4.6)

## 2018-04-24 LAB — CBC
HCT: 27.7 % — ABNORMAL LOW (ref 39.0–52.0)
Hemoglobin: 9.6 g/dL — ABNORMAL LOW (ref 13.0–17.0)
MCH: 35 pg — AB (ref 26.0–34.0)
MCHC: 34.7 g/dL (ref 30.0–36.0)
MCV: 101.1 fL — ABNORMAL HIGH (ref 78.0–100.0)
PLATELETS: 61 10*3/uL — AB (ref 150–400)
RBC: 2.74 MIL/uL — ABNORMAL LOW (ref 4.22–5.81)
RDW: 19.6 % — AB (ref 11.5–15.5)
WBC: 9.2 10*3/uL (ref 4.0–10.5)

## 2018-04-24 LAB — COMPREHENSIVE METABOLIC PANEL
ALBUMIN: 2.8 g/dL — AB (ref 3.5–5.0)
ALK PHOS: 117 U/L (ref 38–126)
ALT: 291 U/L — AB (ref 17–63)
AST: 508 U/L — AB (ref 15–41)
Anion gap: 10 (ref 5–15)
BILIRUBIN TOTAL: 11.3 mg/dL — AB (ref 0.3–1.2)
BUN: 51 mg/dL — AB (ref 6–20)
CALCIUM: 8.1 mg/dL — AB (ref 8.9–10.3)
CO2: 24 mmol/L (ref 22–32)
CREATININE: 4.63 mg/dL — AB (ref 0.61–1.24)
Chloride: 99 mmol/L — ABNORMAL LOW (ref 101–111)
GFR calc Af Amer: 16 mL/min — ABNORMAL LOW (ref >60)
GFR, EST NON AFRICAN AMERICAN: 14 mL/min — AB (ref >60)
Glucose, Bld: 99 mg/dL (ref 65–99)
POTASSIUM: 5.3 mmol/L — AB (ref 3.5–5.1)
Sodium: 133 mmol/L — ABNORMAL LOW (ref 135–145)
TOTAL PROTEIN: 5.7 g/dL — AB (ref 6.5–8.1)

## 2018-04-24 LAB — MAGNESIUM: MAGNESIUM: 2.5 mg/dL — AB (ref 1.7–2.4)

## 2018-04-24 MED ORDER — PRISMASOL BGK 0/2.5 32-2.5 MEQ/L IV SOLN
INTRAVENOUS | Status: DC
  Administered 2018-04-24 – 2018-04-25 (×13): via INTRAVENOUS_CENTRAL
  Filled 2018-04-24 (×22): qty 5000

## 2018-04-24 MED ORDER — FENTANYL CITRATE (PF) 100 MCG/2ML IJ SOLN
25.0000 ug | INTRAMUSCULAR | Status: DC | PRN
  Administered 2018-04-25 (×2): 25 ug via INTRAVENOUS
  Filled 2018-04-24 (×2): qty 2

## 2018-04-24 MED ORDER — PRISMASOL BGK 0/2.5 32-2.5 MEQ/L IV SOLN
INTRAVENOUS | Status: DC
  Filled 2018-04-24 (×6): qty 5000

## 2018-04-24 NOTE — Progress Notes (Signed)
[facility] NEPHROLOGY PROGRESS NOTE  Assessment/ Plan: Pt is a 47 y.o. yo male  Morbidly obese, NASH liver cirrhosis, obligated by hepatic encephalopathy, chronic thrombocytopenia, chronic lower extremities edema, history of hypertension, OSA on CPAP who was treated with Valtrex for peri-orbital shingles 3 weeks prior to admission, presented with weight gain of about 30 pounds in 3 weeks associated with generalized weakness and worsening liver function.  Patient was found to have rhabdomyolysis and worsening renal failure. Transferred to [facility] on 5/20.  Assessment/Plan:  #Acute kidney injury likely multifactorial including ATN due to rhabdomyolysis, lisinopril use, poor renal perfusion in cirrhotic patient.  Other concern is hepatorenal syndrome given low urine sodium in cirrhotic patient. -Treated  with octreotide, midodrine, albumin with no improvement in renal function.  -Patient remained anuric with continue to elevated serum creatinine level.  Mild hyperkalemia.  Remained hypotensive.  After discussion with the patient and family, temporary catheter was placed and  started on CRRT on 5/22.  Required up titration of levo yesterday.  Able to remove about 2 kg of fluid through CRRT.  Urine output of only 160 cc in 24 hours.  Potassium level mildly elevated to 5.3 therefore changed to [employer] in dialysate fluid.  Discussed with the patient and his wife, we will try CRRT till tomorrow morning to see if there is any sign of renal recovery.  If he still remains anuric and no improvement in clinical symptoms, plan to discontinue CRRT.  Palliative care noted and patient is currently DNR.   #Coagulopathy due to liver disease: Received FFP before catheter placement.  No sign of bleeding.   #Hyperkalemia: Change potassium bath in dialysis fluid.  Monitor.  #Hyponatremia: Hypervolemic hyponatremia in the setting of liver disease.  Monitor labs.  #History of hypertension: Currently hypotensive  on pressors.  Off lisinopril.  # NASH, liver disease, transaminitis: Liver enzymes are trending down.  Follow-up GI.  Subjective: Seen and examined at bedside.  Over night and yesterday's event noted.  No clinical improvement.  He has some shortness of breath but denies chest pain.  Wife at bedside. Objective Vital signs in last 24 hours: Vitals:   04/24/18 0827 04/24/18 0830 04/24/18 0845 04/24/18 0900  BP:  (!) 105/34 (!) 117/45   Pulse:  80 76 73  Resp:  19 (!) 21 18  Temp: 97.8 F (36.6 C)     TempSrc: Oral     SpO2:  (!) 89% 91% 94%  Weight:      Height:       Weight change: 2.722 kg (6 lb)  Intake/Output Summary (Last 24 hours) at 04/24/2018 0959 Last data filed at 04/24/2018 0900 Gross per 24 hour  Intake 1793.22 ml  Output 3290 ml  Net -1496.78 ml       Labs: Basic Metabolic Panel: Recent Labs  Lab 04/23/18 0319 04/23/18 1600 04/24/18 0339  NA 133*  131* 132* 133*  K 4.3  4.3 4.2 5.3*  CL 98*  97* 98* 99*  CO2 25  23 23 24   GLUCOSE 86  83 118* 99  BUN 70*  68* 59* 51*  CREATININE 5.99*  5.75* 5.17* 4.63*  CALCIUM 7.5*  7.5* 7.7* 8.1*  PHOS 6.9* 6.2* 5.9*   Liver Function Tests: Recent Labs  Lab 04/22/18 0301 04/23/18 0319 04/23/18 1600 04/24/18 0339  AST 738* 605*  --  508*  ALT 378* 334*  --  291*  ALKPHOS 101 111  --  117  BILITOT 8.9* 10.1*  --  11.3*  PROT 5.0* 5.8*  --  5.7*  ALBUMIN 2.6* 3.1*  3.1* 2.9* 2.8*   No results for input(s): LIPASE, AMYLASE in the last 168 hours. No results for input(s): AMMONIA in the last 168 hours. CBC: Recent Labs  Lab 04/19/18 0527 04/22/18 0301 04/24/18 0339  WBC 8.0 5.6 9.2  HGB 11.0* 8.2* 9.6*  HCT 31.1* 24.1* 27.7*  MCV 100.0 103.0* 101.1*  PLT 93* 63* 61*   Cardiac Enzymes: Recent Labs  Lab 04/19/18 0527 04/20/18 0859 04/21/18 0954 04/22/18 0301  CKTOTAL 18,898* 12,136* 7,500* 4,826*   CBG: Recent Labs  Lab 04/23/18 1530 04/23/18 1956 04/23/18 2322 04/24/18 0359  04/24/18 0826  GLUCAP 91 99 84 91 114*    Iron Studies: No results for input(s): IRON, TIBC, TRANSFERRIN, FERRITIN in the last 72 hours. Studies/Results: Dg Chest Portable 1 View  Result Date: 04/22/2018 CLINICAL DATA:  Status post central line placement today. EXAM: PORTABLE CHEST 1 VIEW COMPARISON:  PA and lateral chest 11/18/2017. FINDINGS: New right IJ approach dialysis catheter is in place with the tip projecting in the mid superior vena cava. No pneumothorax. There is cardiomegaly and pulmonary edema. No definite pleural effusion. IMPRESSION: Right IJ catheter tip projects in the mid superior vena cava. Negative for pneumothorax. Cardiomegaly and pulmonary edema. Electronically Signed   By: Inge Rise M.D.   On: 04/22/2018 14:21    Medications: Infusions: . sodium chloride    . norepinephrine (LEVOPHED) Adult infusion 50 mcg/min (04/24/18 0703)  . dialysis replacement fluid (prismasate) 400 mL/hr at 04/24/18 0104  . dialysis replacement fluid (prismasate) 300 mL/hr at 04/23/18 0817  . dialysate (PRISMASATE) 2,500 mL/hr at 04/24/18 0757  . sodium chloride      Scheduled Medications: . chlorhexidine  15 mL Mouth Rinse BID  . lactulose  30 g Oral TID  . mouth rinse  15 mL Mouth Rinse q12n4p  . midodrine  15 mg Oral TID WC  . nystatin   Topical TID  . pantoprazole  80 mg Oral Daily  . rifaximin  550 mg Oral BID  . saccharomyces boulardii  250 mg Oral BID  . sertraline  50 mg Oral Daily    have reviewed scheduled and prn medications.  Physical Exam: General: Obese male lying in bed, temporary dialysis catheter and neck looks clean with no bleeding. Heart: Regular rate rhythm S1-S2 normal, no rubs Lungs: Distant breath sound Abdomen: Soft, nontender, obese Extremities: Lateral lower extremities pitting edema.  Unchanged  Chardai Gangemi Prasad Kepler Mccabe 04/24/2018,9:59 AM  LOS: 8 days

## 2018-04-24 NOTE — Progress Notes (Addendum)
PULMONARY / CRITICAL CARE MEDICINE   Name: Ryan Burgess MRN: [id number] DOB: [birth year]    ADMISSION DATE:  04/16/2018 CONSULTATION DATE:  04/22/2018   REFERRING MD:  Sherral Hammers  CHIEF COMPLAINT:  Hypotension, renal failure  HISTORY OF PRESENT ILLNESS:   47 y/o male with NASH cirrhosis was admitted to [facility] in the setting of worsening renal failure.  He has been followed here by hepatology and the renal team.  He has been given midodrine and octreotide here but despite that his renal function has worsened.  He was moved to the ICU for CRRT. He has been hypotensive here, baseline systolic blood pressure is 140.  In the ICU he was noted to be profoundly hypotensive.  Attempts were made a placing an arterial line but were unsuccessful.    SUBJECTIVE: Remains on 40 mcg Levophed. CRRT ongoing, Ultrafiltration removed 600 ml fluid last night.  Urine output only 43 ml so far today. Pt states he has some SOB, denies any Chest pain  ROS: Positives in BOLD: Gen: Denies fever, chills, weight change, fatigue, night sweats HEENT: Denies blurred vision, double vision, hearing loss, tinnitus, sinus congestion, rhinorrhea, sore throat, neck stiffness, dysphagia PULM: Denies +shortness of breath, cough, sputum production, hemoptysis, wheezing CV: Denies chest pain, edema, orthopnea, paroxysmal nocturnal dyspnea, palpitations GI: Denies abdominal pain, nausea, vomiting, diarrhea, hematochezia, melena, constipation, change in bowel habits GU: Denies dysuria, hematuria, polyuria, oliguria, urethral discharge Endocrine: Denies hot or cold intolerance, polyuria, polyphagia or appetite change Derm: Denies rash, dry skin, scaling or peeling skin change Heme: Denies easy bruising, bleeding, bleeding gums Neuro: Denies headache, numbness, weakness, slurred speech, loss of memory or consciousness   VITAL SIGNS: BP (!) 92/39   Pulse 75   Temp 98.1 F (36.7 C) (Oral)   Resp 17   Ht 6\' 5"  (1.956 m)   Wt (!) 549  lb (249 kg)   SpO2 96%   BMI 65.10 kg/m   HEMODYNAMICS:    VENTILATOR SETTINGS:    INTAKE / OUTPUT: I/O last 3 completed shifts: In: 2048.5 [P.O.:695; I.V.:1333.5; Other:20] Out: 5397 [Urine:193; Other:3170; Stool:783]  PHYSICAL EXAMINATION:  General: Acutely ill-appearing, obese male, laying in bed, in no acute distress HENT: Normocephalic, Atraumatic, MM moist/pink, temporary HD catheter in right neck PULM: Clear diminished breath sounds throughout, symmetrical expansion, no increased work of breathing CV: RRR, No M/R/G, palpable radial pulses, anasarca GI: Obese, soft, non-tender MSK: No deformities, normal bulk and tone Neuro: A&O x4, follows commands   LABS:  BMET Recent Labs  Lab 04/23/18 0319 04/23/18 1600 04/24/18 0339  NA 133*  131* 132* 133*  K 4.3  4.3 4.2 5.3*  CL 98*  97* 98* 99*  CO2 25  23 23 24   BUN 70*  68* 59* 51*  CREATININE 5.99*  5.75* 5.17* 4.63*  GLUCOSE 86  83 118* 99    Electrolytes Recent Labs  Lab 04/23/18 0319 04/23/18 1600 04/24/18 0339  CALCIUM 7.5*  7.5* 7.7* 8.1*  MG 2.4  --  2.5*  PHOS 6.9* 6.2* 5.9*    CBC Recent Labs  Lab 04/19/18 0527 04/22/18 0301 04/24/18 0339  WBC 8.0 5.6 9.2  HGB 11.0* 8.2* 9.6*  HCT 31.1* 24.1* 27.7*  PLT 93* 63* 61*    Coag's Recent Labs  Lab 04/21/18 0954 04/22/18 0301  INR 3.52 3.87    Sepsis Markers No results for input(s): LATICACIDVEN, PROCALCITON, O2SATVEN in the last 168 hours.  ABG No results for input(s): PHART, PCO2ART, PO2ART in  the last 168 hours.  Liver Enzymes Recent Labs  Lab 04/22/18 0301 04/23/18 0319 04/23/18 1600 04/24/18 0339  AST 738* 605*  --  508*  ALT 378* 334*  --  291*  ALKPHOS 101 111  --  117  BILITOT 8.9* 10.1*  --  11.3*  ALBUMIN 2.6* 3.1*  3.1* 2.9* 2.8*    Cardiac Enzymes No results for input(s): TROPONINI, PROBNP in the last 168 hours.  Glucose Recent Labs  Lab 04/23/18 1530 04/23/18 1956 04/23/18 2322  04/24/18 0359 04/24/18 0826 04/24/18 1116  GLUCAP 91 99 84 91 114* 123*    Imaging No results found.   LINES/TUBES: 5/22 R IJ HD cath   DISCUSSION: 47 year old male with a past medical history significant for nonalcoholic  steatohepatitis related cirrhosis here with worsening renal failure likely due to hepatorenal syndrome.  At baseline he has morbid obesity, history of hepatic encephalopathy.  Overall prognosis is poor.  Moved to the ICU for vasopressor support and CVVHD.  ASSESSMENT / PLAN:  PULMONARY A: No acute issues P:   Supplemental O2 to maintain O2 sats >92% Pulmonary hygiene  CARDIOVASCULAR A:  Hypotension in setting of advanced cirrhosis, likely due to increase splancnic blood flow P:  ICU Monitoring Continue Levophed gtt to maintain MAP >85 Continue Midodrine  RENAL A:   Hepatorenal syndrome Rhabdomyolysis Hyponatremia - hypervolemic Hyperkalemia P:   Continue Levophed & Midodrine Nephrology following CVVHD per Nephrology; dialysate changed to 0K bath per Nephrology due to Hyperkalemia Follow BMET Follow I&O's Avoid Nephrotoxic agents  GASTROINTESTINAL A:   Advanced cirrhosis, poor prognosis Hepatic encephalopathy P:   GI following Not a liver transplant candidate per GI  Continue Rifaximin & Lactulose Diet as tolerated Palliative care following Follow LFT's  HEMATOLOGIC A:   Chronic thrombocytopenia Anemia P:  Monitor for S/sx of bleeding Follow CBC Transfuse for Hgb<7  INFECTIOUS A:   No acute issues P:   Monitor fever curve Follow CBC  ENDOCRINE A:   No acute issues P:   Follow CBG's Follow ICU Hyper/hypoglycemic protocol  NEUROLOGIC A:   History of hepatic encephalopathy P:   Continue Rifaximin & Lactulose Avoid sedating medications Provide supportive care Lights on during the day  FAMILY  - Updates: Family updated at bedside 5/24.  Palliative care following.  Had family meeting on 5/23.  Code status  changed to DNR on 5/23, and plan to continue with current medical therapies. Per Renal note, plan is to continue CRRT until tomorrow 5/25 and reassess for any signs of renal improvement.  If he remains anuric with no improvement clinically on 5/25, plan will be to d/c CRRT then.    Darel Hong, AGACNP-BC [facility] [facility] Pager: [phone]  or [phone] 04/24/2018, 2:43 PM

## 2018-04-24 NOTE — Progress Notes (Signed)
Daily Progress Note   Patient Name: Ryan Burgess       Date: 04/24/2018 DOB: 02/04/1971  Age: 47 y.o. MRN#: [id number] Attending Physician: Allie Bossier, MD Primary Care Physician: Esaw Grandchild, NP Admit Date: 04/12/2018  Reason for Consultation/Follow-up: Establishing goals of care  Subjective: Patient wakes to voice. Will answer questions. Drowsy and appears more jaundice today. He c/o slight shortness of breath.   Petal Wife, father, sister, and mother-in-law at bedside. Introduced myself again with palliative medicine team. We spent time discussing diagnoses and interventions. Ruby Cola states "I'm tired." Wife recalls her conversation with Dr. Carolin Sicks this morning with plan for one more day of CRRT and possible withdraw of care tomorrow. Patient and family understand diagnoses and poor prognosis. We discussed transition to comfort measure including goal of comfort and dignity at EOL. Educated on use of medications to ensure comfort and relief from suffering. Wife very tearful.   [facility] information given to wife. Emotional/spiritual support provided. Answered questions and concerns. Family appreciative of visit.    Length of Stay: 8  Current Medications: Scheduled Meds:  . chlorhexidine  15 mL Mouth Rinse BID  . lactulose  30 g Oral TID  . mouth rinse  15 mL Mouth Rinse q12n4p  . midodrine  15 mg Oral TID WC  . nystatin   Topical TID  . pantoprazole  80 mg Oral Daily  . rifaximin  550 mg Oral BID  . saccharomyces boulardii  250 mg Oral BID  . sertraline  50 mg Oral Daily    Continuous Infusions: . sodium chloride    . norepinephrine (LEVOPHED) Adult infusion 40 mcg/min (04/24/18 1039)  . dialysis replacement fluid (prismasate) 400 mL/hr at 04/24/18 0104  . dialysis  replacement fluid (prismasate) 300 mL/hr at 04/23/18 0817  . dialysate (PRISMASATE) 2,500 mL/hr at 04/24/18 1002  . sodium chloride      PRN Meds: Place/Maintain arterial line **AND** sodium chloride, fentaNYL (SUBLIMAZE) injection, heparin, loratadine, ondansetron **OR** ondansetron (ZOFRAN) IV, sodium chloride  Physical Exam  Constitutional: He is oriented to person, place, and time. He is easily aroused. He appears ill.  Cardiovascular: Regular rhythm.  Pulmonary/Chest: No accessory muscle usage. No tachypnea. No respiratory distress.  Neurological: He is alert, oriented to person, place, and time and easily aroused.  drowsy  Skin:  jaundice  Nursing note and vitals reviewed.          Vital Signs: BP (!) 100/34   Pulse 76   Temp 97.8 F (36.6 C) (Oral)   Resp 19   Ht 6\' 5"  (1.956 m)   Wt (!) 249 kg (549 lb)   SpO2 97%   BMI 65.10 kg/m  SpO2: SpO2: 97 % O2 Device: O2 Device: Nasal Cannula O2 Flow Rate: O2 Flow Rate (L/min): 4 L/min  Intake/output summary:   Intake/Output Summary (Last 24 hours) at 04/24/2018 1104 Last data filed at 04/24/2018 1100 Gross per 24 hour  Intake 1911.42 ml  Output 3284 ml  Net -1372.58 ml   LBM: Last BM Date: 04/24/18 Baseline Weight: Weight: (!) 244 kg (538 lb) Most recent weight: Weight: (!) 249 kg (549 lb)       Palliative Assessment/Data: PPS 30%   Flowsheet Rows     Most Recent Value  Intake Tab  Referral Department  Hospitalist  Unit at Time of Referral  Med/Surg Unit  Palliative Care Primary Diagnosis  -- [cirrhosis/ARF on CRRT]  Date Notified  04/21/18  Palliative Care Type  New Palliative care  Reason for referral  Clarify Goals of Care  Date of Admission  04/19/2018  Date first seen by Palliative Care  04/23/18  # of days Palliative referral response time  2 Day(s)  # of days IP prior to Palliative referral  8  Clinical Assessment  Palliative Performance Scale Score  30%  Psychosocial & Spiritual Assessment    Palliative Care Outcomes  Patient/Family meeting held?  Yes  Who was at the meeting?  patient, wife, sister  Palliative Care Outcomes  Clarified goals of care, ACP counseling assistance, Provided end of life care assistance, Provided psychosocial or spiritual support, Counseled regarding hospice      Patient Active Problem List   Diagnosis Date Noted  . Palliative care by specialist   . Encounter for continuous renal replacement therapy (CRRT) for acute renal failure ([place])   . Abdominal distention   . Acute hepatitis 04/16/2018  . NASH (nonalcoholic steatohepatitis) 04/14/2018  . Cirrhosis ([place]) 04/21/2018  . Morbid obesity with BMI of 60.0-69.9, adult ([place]) 04/09/2018  . Herpes zoster without complication 40/98/1191  . Other pancytopenia ([place]) 11/08/2017  . Upper respiratory tract infection 10/16/2017  . Goals of care, counseling/discussion 10/16/2017  . AKI (acute kidney injury) ([place]) 12/19/2016  . Hyponatremia 12/19/2016  . Pressure injury of skin 12/19/2016  . Encephalopathy, hepatic ([place]) 11/20/2015  . Hepatic cirrhosis ([place]) 11/20/2015  . Thrombocytopenia ([place])   . Stroke or transient ischemic attack (TIA) diagnosed during current admission 11/19/2015  . OSA on CPAP 05/02/2015  . Hemorrhagic cystitis 01/29/2015  . Lymphedema 01/29/2015  . Obesity hypoventilation syndrome ([place]) 01/25/2015  . Snoring   . High cholesterol   . TIA (transient ischemic attack) 01/10/2015  . Encephalopathy acute 01/09/2015  . HTN (hypertension) 01/09/2015  . Borderline diabetic 01/09/2015  . GERD (gastroesophageal reflux disease) 01/09/2015  . Acute encephalopathy     Palliative Care Assessment & Plan   Patient Profile: 47 y.o. male  with past medical history of NASH cirrhosis, hepatic encephalopathy, obesity, OSA with CPAP, HTN, HLD, GERD, cellulitis admitted on 04/26/2018 with abdominal swelling and BLE swelling. Presented with weight gain of about 30 lbs in 3 weeks and worsening liver  function. Patient recently diagnosed with shingles and started on Valtrex three weeks prior to admission. In ED, patient found to have rhabdomyolysis  and worsening renal failure. Nephrology following for acute kidney injury of multifactorial etiology including ATN secondary to rhabdomyolysis, lisinopril use, poor renal perfusion with cirrhosis, and/or hepatorenal syndrome. On 5/22, patient transferred to ICU for CRRT initiation. Also with hypotension requiring pressor support. Per nephrology, patient is a poor candidate for long-term dialysis. GI following. Patient is not a candidate for liver transplant and recommending palliative measures. Palliative medicine consultation for goals of care/terminal care.   Assessment: Karlene Lineman liver cirrhosis Acute kidney injury--ATN vs. Hepatorenal syndrome Hyptoension Anuria Hepatic encephalopathy Rhabdomyolysis Chronic thrombocytopenia Hyperkalemia Hyponatremia  Recommendations/Plan:  Patient and family have made decision for DNR. Patient/family have a good understanding of diagnoses and poor prognosis.   Discussed plan of care with Dr. Carolin Sicks and family. CRRT for 24 more hours. If patient does not show improvement in AM, likely discontinuation of CRRT with goal of transition to comfort measures.   Fentanyl 69mcg q2h prn pain/dyspnea/air hunger  Dr. Micheline Rough from [facility] will f/u with nephrology and family in AM.   Code Status: DNR   Code Status Orders  (From admission, onward)        Start     Ordered   04/23/18 1819  Do not attempt resuscitation (DNR)  Continuous    Question Answer Comment  In the event of cardiac or respiratory ARREST Do not call a "code blue"   In the event of cardiac or respiratory ARREST Do not perform Intubation, CPR, defibrillation or ACLS   In the event of cardiac or respiratory ARREST Use medication by any route, position, wound care, and other measures to relive pain and suffering. May use oxygen, suction and manual  treatment of airway obstruction as needed for comfort.      04/23/18 1818    Code Status History    Date Active Date Inactive Code Status Order ID Comments User Context   04/11/2018 2346 04/23/2018 1818 Full Code [id number]  Etta Quill, DO ED   01/16/2017 1752 01/19/2017 1913 Full Code [id number]  Lavina Hamman, MD Inpatient   12/19/2016 1820 12/23/2016 1924 Full Code [id number]  Mendel Corning, MD Inpatient   11/20/2015 0020 11/20/2015 2039 Full Code [id number]  Norval Morton, MD Inpatient   01/29/2015 0450 01/30/2015 2031 Full Code [id number]  Theressa Millard, MD Inpatient   01/09/2015 2117 01/11/2015 1621 Full Code [id number]  Kelvin Cellar, MD Inpatient       Prognosis:   Unable to determine: poor prognosis with multiorgan failure with underlying decompensated NASH cirrhosis, acute renal failure requiring CRRT, and hypotension on levophed continuous infusion. High risk for clinical decompensation leading to hospital death.  Discharge Planning:  To Be Determined   Care plan was discussed with patient, wife, sister, father, RN, Dr. Carolin Sicks  Thank you for allowing the Palliative Medicine Team to assist in the care of this patient.   Time In: 1130 Time Out: 1205 Total Time 40min Prolonged Time Billed  no      Greater than 50%  of this time was spent counseling and coordinating care related to the above assessment and plan.  Ihor Dow, FNP-C Palliative Medicine Team  Phone: [phone] Fax: [phone]  Please contact Palliative Medicine Team phone at [phone] for questions and concerns.

## 2018-04-25 DIAGNOSIS — Z515 Encounter for palliative care: Secondary | ICD-10-CM

## 2018-04-25 DIAGNOSIS — Z7189 Other specified counseling: Secondary | ICD-10-CM

## 2018-04-25 LAB — RENAL FUNCTION PANEL
ALBUMIN: 2.4 g/dL — AB (ref 3.5–5.0)
ANION GAP: 7 (ref 5–15)
BUN: 39 mg/dL — ABNORMAL HIGH (ref 6–20)
CALCIUM: 8.1 mg/dL — AB (ref 8.9–10.3)
CO2: 26 mmol/L (ref 22–32)
Chloride: 99 mmol/L — ABNORMAL LOW (ref 101–111)
Creatinine, Ser: 4.27 mg/dL — ABNORMAL HIGH (ref 0.61–1.24)
GFR calc Af Amer: 18 mL/min — ABNORMAL LOW (ref >60)
GFR calc non Af Amer: 15 mL/min — ABNORMAL LOW (ref >60)
Glucose, Bld: 104 mg/dL — ABNORMAL HIGH (ref 65–99)
PHOSPHORUS: 5.9 mg/dL — AB (ref 2.5–4.6)
Potassium: 4.7 mmol/L (ref 3.5–5.1)
SODIUM: 132 mmol/L — AB (ref 135–145)

## 2018-04-25 LAB — GLUCOSE, CAPILLARY
Glucose-Capillary: 89 mg/dL (ref 65–99)
Glucose-Capillary: 86 mg/dL (ref 65–99)

## 2018-04-25 LAB — MAGNESIUM: MAGNESIUM: 2.4 mg/dL (ref 1.7–2.4)

## 2018-04-25 MED ORDER — ACETAMINOPHEN 325 MG PO TABS: 650.0000 mg | ORAL_TABLET | Freq: Four times a day (QID) | ORAL | Status: DC | PRN

## 2018-04-25 MED ORDER — GLYCOPYRROLATE 1 MG PO TABS: 1.0000 mg | ORAL_TABLET | ORAL | Status: DC | PRN

## 2018-04-25 MED ORDER — ALUM & MAG HYDROXIDE-SIMETH 200-200-20 MG/5ML PO SUSP
30.0000 mL | Freq: Four times a day (QID) | ORAL | Status: DC | PRN
  Administered 2018-04-25: 30 mL via ORAL
  Filled 2018-04-25: qty 30

## 2018-04-25 MED ORDER — HALOPERIDOL LACTATE 5 MG/ML IJ SOLN: 0.5000 mg | INTRAMUSCULAR | Status: DC | PRN

## 2018-04-25 MED ORDER — HALOPERIDOL LACTATE 2 MG/ML PO CONC
0.5000 mg | ORAL | Status: DC | PRN
  Filled 2018-04-25: qty 0.3

## 2018-04-25 MED ORDER — LORAZEPAM 2 MG/ML IJ SOLN
1.0000 mg | INTRAMUSCULAR | Status: DC | PRN
  Administered 2018-04-25: 1 mg via INTRAVENOUS
  Filled 2018-04-25: qty 1

## 2018-04-25 MED ORDER — FENTANYL CITRATE (PF) 100 MCG/2ML IJ SOLN
50.0000 ug | INTRAMUSCULAR | Status: DC | PRN
  Administered 2018-04-25: 100 ug via INTRAVENOUS
  Administered 2018-04-25: 50 ug via INTRAVENOUS
  Administered 2018-04-25 (×2): 100 ug via INTRAVENOUS
  Filled 2018-04-25 (×4): qty 2

## 2018-04-25 MED ORDER — ACETAMINOPHEN 650 MG RE SUPP: 650.0000 mg | Freq: Four times a day (QID) | RECTAL | Status: DC | PRN

## 2018-04-25 MED ORDER — LORAZEPAM 2 MG/ML PO CONC: 1.0000 mg | ORAL | Status: DC | PRN

## 2018-04-25 MED ORDER — LORAZEPAM 1 MG PO TABS: 1.0000 mg | ORAL_TABLET | ORAL | Status: DC | PRN

## 2018-04-25 MED ORDER — BIOTENE DRY MOUTH MT LIQD: 15.0000 mL | OROMUCOSAL | Status: DC | PRN

## 2018-04-25 MED ORDER — HALOPERIDOL 0.5 MG PO TABS: 0.5000 mg | ORAL_TABLET | ORAL | Status: DC | PRN

## 2018-04-25 MED ORDER — FENTANYL BOLUS VIA INFUSION
50.0000 ug | INTRAVENOUS | Status: DC | PRN
  Filled 2018-04-25: qty 100

## 2018-04-25 MED ORDER — FENTANYL BOLUS VIA INFUSION
10.0000 ug | INTRAVENOUS | Status: DC | PRN
  Administered 2018-04-25 (×2): 30 ug via INTRAVENOUS
  Filled 2018-04-25: qty 30

## 2018-04-25 MED ORDER — FENTANYL 2500MCG IN NS 250ML (10MCG/ML) PREMIX INFUSION
0.0000 ug/h | INTRAVENOUS | Status: DC
  Administered 2018-04-25: 100 ug/h via INTRAVENOUS
  Filled 2018-04-25: qty 250

## 2018-04-25 MED ORDER — GLYCOPYRROLATE 0.2 MG/ML IJ SOLN: 0.2000 mg | INTRAMUSCULAR | Status: DC | PRN

## 2018-04-25 MED ORDER — POLYVINYL ALCOHOL 1.4 % OP SOLN
1.0000 [drp] | Freq: Four times a day (QID) | OPHTHALMIC | Status: DC | PRN
  Filled 2018-04-25: qty 15

## 2018-04-28 ENCOUNTER — Encounter: Payer: BLUE CROSS/BLUE SHIELD | Admitting: Occupational Therapy

## 2018-04-29 ENCOUNTER — Telehealth: Payer: Self-pay

## 2018-04-29 NOTE — Telephone Encounter (Signed)
On 04/29/18 I received a signed d/c from [facility].  The funeral home faxed the d/c to pulmonary instead of medical records.

## 2018-04-30 ENCOUNTER — Encounter: Payer: BLUE CROSS/BLUE SHIELD | Admitting: Occupational Therapy

## 2018-04-30 ENCOUNTER — Telehealth: Payer: Self-pay

## 2018-04-30 NOTE — Telephone Encounter (Signed)
On 04/30/18 I received a d/c from [facility]. (original). The d/c is for cremation.  The patient is a patient of Doctor Mannam.  The d/c will be taken to Pulmonary Unit @ [facility] for signature.   On 05/01/18 I received the d/c back from Doctor Mannam.  I got the d/c ready and called the funeral home to let them know the d/c is ready for pickup.

## 2018-05-02 NOTE — Progress Notes (Addendum)
[facility] NEPHROLOGY PROGRESS NOTE  Assessment/ Plan: Pt is a 47 y.o. yo male  Morbidly obese, NASH liver cirrhosis, obligated by hepatic encephalopathy, chronic thrombocytopenia, chronic lower extremities edema, history of hypertension, OSA on CPAP who was treated with Valtrex for peri-orbital shingles 3 weeks prior to admission, presented with weight gain of about 30 pounds in 3 weeks associated with generalized weakness and worsening liver function.  Patient was found to have rhabdomyolysis and worsening renal failure. Transferred to [facility] on 5/20.  Assessment/Plan:  #Acute kidney injury likely multifactorial including ATN due to rhabdomyolysis, lisinopril use, poor renal perfusion in cirrhotic patient.  Other concern is hepatorenal syndrome given low urine sodium in cirrhotic patient. -Treated  with octreotide, midodrine, albumin with no improvement in renal function.  -Patient remains in anuric with no sign of renal recovery.  Patient became hypotensive required pressures in the ICU.  Tried CRRT for more than 48 hours with no clinical recovery.  I have discussed with the patient and his wife, patient is clear about no further intervention and want to be comfort.  I discussed this with palliative care as well as pulmonary critical care team.  Since there is no meaningful clinical recovery and worsening condition, I am discontinuing CRRT.  I recommend hospice and comfort care.   #Coagulopathy due to liver disease: Received FFP before catheter placement.  No sign of bleeding.   #Hyperkalemia: Improved after CRRT.  #Hyponatremia: Hypervolemic hyponatremia in the setting of liver disease.    #History of hypertension: Currently hypotensive on pressors.    # NASH, liver disease, transaminitis, hepatorenal syndrome.  Poor prognosis.  Recommend hospice and comfort care.  I will sign off at this time.  Please call us with question.  Subjective: Seen and examined at bedside.  Remains  anuric.  Patient is not reporting some chest discomfort and shortness of breath.  He is clear about not wanting any intervention.  Wife at bedside. Objective Vital signs in last 24 hours: Vitals:   Apr 30, 2018 0845 04-30-2018 0900 04-30-2018 0915 2018/04/30 0945  BP: (!) 108/36 (!) 102/34 (!) 101/32 (!) 97/33  Pulse: 71 67 68 69  Resp: 14 14 15 17   Temp:      TempSrc:      SpO2: 97% 96% 96% 97%  Weight:      Height:       Weight change: -5.443 kg (-12 lb)  Intake/Output Summary (Last 24 hours) at 04-30-2018 1110 Last data filed at 2018/04/30 0900 Gross per 24 hour  Intake 1930.24 ml  Output 2570 ml  Net -639.76 ml       Labs: Basic Metabolic Panel: Recent Labs  Lab 04/24/18 0339 04/24/18 1600 04/30/18 0416  NA 133* 132* 132*  K 5.3* 4.9 4.7  CL 99* 98* 99*  CO2 24 24 26   GLUCOSE 99 103* 104*  BUN 51* 44* 39*  CREATININE 4.63* 4.37* 4.27*  CALCIUM 8.1* 8.1* 8.1*  PHOS 5.9* 5.9* 5.9*   Liver Function Tests: Recent Labs  Lab 04/22/18 0301 04/23/18 0319  04/24/18 0339 04/24/18 1600 Apr 30, 2018 0416  AST 738* 605*  --  508*  --   --   ALT 378* 334*  --  291*  --   --   ALKPHOS 101 111  --  117  --   --   BILITOT 8.9* 10.1*  --  11.3*  --   --   PROT 5.0* 5.8*  --  5.7*  --   --   ALBUMIN  2.6* 3.1*  3.1*   < > 2.8* 2.6* 2.4*   < > = values in this interval not displayed.   No results for input(s): LIPASE, AMYLASE in the last 168 hours. No results for input(s): AMMONIA in the last 168 hours. CBC: Recent Labs  Lab 04/19/18 0527 04/22/18 0301 04/24/18 0339  WBC 8.0 5.6 9.2  HGB 11.0* 8.2* 9.6*  HCT 31.1* 24.1* 27.7*  MCV 100.0 103.0* 101.1*  PLT 93* 63* 61*   Cardiac Enzymes: Recent Labs  Lab 04/19/18 0527 04/20/18 0859 04/21/18 0954 04/22/18 0301  CKTOTAL 18,898* 12,136* 7,500* 4,826*   CBG: Recent Labs  Lab 04/24/18 1606 04/24/18 1936 04/24/18 2322 29-Apr-2018 0306 04-29-18 0738  GLUCAP 105* 101* 99 89 86    Iron Studies: No results for input(s):  IRON, TIBC, TRANSFERRIN, FERRITIN in the last 72 hours. Studies/Results: No results found.  Medications: Infusions: . sodium chloride      Scheduled Medications: . chlorhexidine  15 mL Mouth Rinse BID    have reviewed scheduled and prn medications.  Physical Exam: General: Obese male, looks comfortable, pale and jaundiced Heart: Regular rate rhythm S1-S2 normal, no rubs Lungs: Distant breath sound Abdomen: Soft, nontender, obese Extremities: Anasarca  Ryan Burgess Ryan Burgess Apr 29, 2018,11:10 AM  LOS: 9 days

## 2018-05-02 NOTE — Progress Notes (Signed)
PULMONARY / CRITICAL CARE MEDICINE   Name: KOKI BUXTON MRN: [id number] DOB: [birth year]    ADMISSION DATE:  04/11/2018 CONSULTATION DATE:  04/22/2018   REFERRING MD:  Sherral Hammers  CHIEF COMPLAINT:  Hypotension, renal failure  Brief:   47 y/o male with NASH cirrhosis was admitted to [facility] in the setting of worsening renal failure. Attempted CRRT, however, patient progressed to Full Comfort Care as of 5/25.  SUBJECTIVE:   ROS:  Unable to review   VITAL SIGNS: BP (!) 105/38   Pulse (!) 101   Temp 97.7 F (36.5 C) (Oral)   Resp (!) 21   Ht 6\' 5"  (1.956 m)   Wt (!) 243.6 kg (537 lb)   SpO2 93%   BMI 63.68 kg/m   HEMODYNAMICS:    VENTILATOR SETTINGS:    INTAKE / OUTPUT: I/O last 3 completed shifts: In: 2527.3 [P.O.:1445; I.V.:1082.3] Out: 3110 [Urine:100; ZESPQ:3300; Stool:299]  PHYSICAL EXAMINATION:  General: Obese Male, no distress  HEENT: distant breath sounds, non-labored  CV: RRR, no MRG anasarca GI: Obese, soft, non-tender MSK: -edema  Neuro: restless, lethargic, does not follow commands    LABS:  BMET Recent Labs  Lab 04/24/18 0339 04/24/18 1600 2018-05-12 0416  NA 133* 132* 132*  K 5.3* 4.9 4.7  CL 99* 98* 99*  CO2 24 24 26   BUN 51* 44* 39*  CREATININE 4.63* 4.37* 4.27*  GLUCOSE 99 103* 104*    Electrolytes Recent Labs  Lab 04/23/18 0319  04/24/18 0339 04/24/18 1600 05/12/2018 0416  CALCIUM 7.5*  7.5*   < > 8.1* 8.1* 8.1*  MG 2.4  --  2.5*  --  2.4  PHOS 6.9*   < > 5.9* 5.9* 5.9*   < > = values in this interval not displayed.    CBC Recent Labs  Lab 04/19/18 0527 04/22/18 0301 04/24/18 0339  WBC 8.0 5.6 9.2  HGB 11.0* 8.2* 9.6*  HCT 31.1* 24.1* 27.7*  PLT 93* 63* 61*    Coag's Recent Labs  Lab 04/21/18 0954 04/22/18 0301  INR 3.52 3.87    Sepsis Markers No results for input(s): LATICACIDVEN, PROCALCITON, O2SATVEN in the last 168 hours.  ABG No results for input(s): PHART, PCO2ART, PO2ART in the last 168  hours.  Liver Enzymes Recent Labs  Lab 04/22/18 0301 04/23/18 0319  04/24/18 0339 04/24/18 1600 05-12-2018 0416  AST 738* 605*  --  508*  --   --   ALT 378* 334*  --  291*  --   --   ALKPHOS 101 111  --  117  --   --   BILITOT 8.9* 10.1*  --  11.3*  --   --   ALBUMIN 2.6* 3.1*  3.1*   < > 2.8* 2.6* 2.4*   < > = values in this interval not displayed.    Cardiac Enzymes No results for input(s): TROPONINI, PROBNP in the last 168 hours.  Glucose Recent Labs  Lab 04/24/18 1116 04/24/18 1606 04/24/18 1936 04/24/18 2322 05-12-18 0306 05/12/18 0738  GLUCAP 123* 105* 101* 99 89 86    Imaging No results found.   LINES/TUBES: R IJ HD cath 5/22 >>   DISCUSSION: 47 year old male with a past medical history significant for nonalcoholic  steatohepatitis related cirrhosis presented with renal failure likely due to hepatorenal syndrome. 5/25 progressed to full comfort measures.   ASSESSMENT / PLAN:  Hypotension in setting of advanced cirrhosis, likely due to increase splancnic blood flow Hepatorenal syndrome Rhabdomyolysis Hyponatremia - Hypervolemic  Hyperkalemia Advanced Liver Disease in setting of NASH Hepatic encephalopathy Chronic thrombocytopenia in setting of liver disease  Anemia P:   -Palliative Care Following > Follow Comfort Measures as of 5/25  -Fentanyl gtt with bolus PRN  -Ativan PRN -Robinul PRN  -Haldol PRN   FAMILY  - Updates: Family updated at bedside    Hayden Pedro, [facility] [place]  Pgr: [phone]  PCCM Pgr: [phone]

## 2018-05-02 NOTE — Progress Notes (Signed)
169ml of Fentanyl wasted down the sink with Lucile Crater RN at [phone]

## 2018-05-02 NOTE — Progress Notes (Signed)
Palliative Care Progress Note  Reason for consult: Goals of care/terminal care in light of renal failure/hepatorenal syndrome.  I met today with patient (largely somnolent), his wife, sister, mother, and father.  We discussed clinical course as well as pathway forward.  His wife reports discussing with Dr. Carolin Sicks this AM and her husband has been clear in desire to stop all interventions and focus only on comfort.  I discussed with Dr. Karie Georges and bedside RN.  Goal moving forward is full comfort care.  Plan to discontinue all other interventions including pressor support.    I think that he will likely decline quickly following this with a prognosis of hours to days at best.    He will have a hospital death even if he maintains for a short period of time as residential hospice facility will not be able to accommodate due to body habitus.  For comfort care symptoms, will plan on: - Pain/SOB: Fentanyl as needed.  I discussed with family and bedside RN regarding potential for continuous infusion if he demonstrates needing frequent rescue doses.  I would have a very low threshold to initiate this and asked staff to call if he is receiving frequent prn doses. - Anxiety: Ativan as needed - Secretions:Robinul as needed - Agitation/nausea: haldol as needed  Total time: 50 minutes Greater than 50%  of this time was spent counseling and coordinating care related to the above assessment and plan.  Micheline Rough, MD [place] Team [phone]

## 2018-05-02 NOTE — Progress Notes (Signed)
   09-May-2018 2300  Clinical Encounter Type  Visited With Family  Visit Type Death  Referral From Nurse  Spiritual Encounters  Spiritual Needs Grief support;Emotional  [place] paged after patient death to support family; [place] offered spiritual, emotional and grief support to family of patient.

## 2018-05-02 NOTE — Progress Notes (Signed)
RN placed pt on CPAP, pt tolerating well and resting comfortably. RT to cont to monitor.

## 2018-05-02 NOTE — Progress Notes (Signed)
Pt expired at [place] RN and Lucile Crater RN auscultated no heart or lung sounds. Family at bedside, chaplain at bedside, [facility] notified.

## 2018-05-02 DEATH — deceased

## 2018-05-04 ENCOUNTER — Encounter: Payer: BLUE CROSS/BLUE SHIELD | Admitting: Occupational Therapy

## 2018-05-06 ENCOUNTER — Encounter: Payer: BLUE CROSS/BLUE SHIELD | Admitting: Occupational Therapy

## 2018-05-11 ENCOUNTER — Encounter: Payer: BLUE CROSS/BLUE SHIELD | Admitting: Occupational Therapy

## 2018-05-13 ENCOUNTER — Encounter: Payer: BLUE CROSS/BLUE SHIELD | Admitting: Occupational Therapy

## 2018-05-18 ENCOUNTER — Encounter: Payer: BLUE CROSS/BLUE SHIELD | Admitting: Occupational Therapy

## 2018-05-20 ENCOUNTER — Encounter: Payer: BLUE CROSS/BLUE SHIELD | Admitting: Occupational Therapy

## 2018-05-25 ENCOUNTER — Encounter: Payer: BLUE CROSS/BLUE SHIELD | Admitting: Occupational Therapy

## 2018-05-27 ENCOUNTER — Encounter: Payer: BLUE CROSS/BLUE SHIELD | Admitting: Occupational Therapy

## 2018-06-01 ENCOUNTER — Encounter: Payer: BLUE CROSS/BLUE SHIELD | Admitting: Occupational Therapy

## 2018-06-01 NOTE — Discharge Summary (Signed)
Physician Death Summary  Patient ID: Ryan Burgess MRN: [id number] DOB/AGE: Sep 17, 1971 47 y.o.  Admit date: 04/15/2018 Discharge date: 2018-05-06 Admission Diagnoses: Hepatic failure, renal failure  Discharge Diagnoses:  Principal Problem:   Cirrhosis ([place]) Active Problems:   HTN (hypertension)   Goals of care, counseling/discussion   NASH (nonalcoholic steatohepatitis)   Acute hepatitis   Abdominal distention   Palliative care by specialist   Encounter for continuous renal replacement therapy (CRRT) for acute renal failure [facility])  Discharged Condition: Deceased  Hospital Course:  Pt is a 47 y.o. yo male  Morbidly obese, NASH liver cirrhosis, obligated by hepatic encephalopathy, chronic thrombocytopenia, chronic lower extremities edema, history of hypertension, OSA on CPAP who was treated with Valtrex for peri-orbital shingles 3 weeks prior to admission, presented with weight gain of about 30 pounds in 3 weeks associated with generalized weakness and worsening liver function.  Patient was found to have worsening renal failure. Transferred to [facility] on 5/20.  Nephrology and GI were consulted for progressive liver failure and hepato renal syndrome vs ATN due to rhabomyolysis. Discussed with [facility] hepatology and ee was not a candidate for liver transplant due to high BMI. Treated  with octreotide, midodrine, albumin with no improvement in renal function. Patient remained in anuric with no sign of renal recovery.  Patient became hypotensive required pressures in the ICU.  Tried CRRT for more than 48 hours with no clinical recovery.   After discussion with patient and family he was transitioned to comfort care and passed away on 05-07-23  Consults: Nephrology, GI, Palliative care  Marshell Garfinkel MD [facility] 05/07/2018, 10:27 AM

## 2018-06-02 ENCOUNTER — Ambulatory Visit: Payer: BLUE CROSS/BLUE SHIELD | Admitting: Hematology and Oncology

## 2018-06-02 ENCOUNTER — Other Ambulatory Visit: Payer: BLUE CROSS/BLUE SHIELD

## 2018-06-03 ENCOUNTER — Encounter: Payer: BLUE CROSS/BLUE SHIELD | Admitting: Occupational Therapy

## 2018-06-05 ENCOUNTER — Ambulatory Visit: Payer: BLUE CROSS/BLUE SHIELD | Admitting: Hematology and Oncology

## 2018-06-05 ENCOUNTER — Other Ambulatory Visit: Payer: BLUE CROSS/BLUE SHIELD

## 2018-06-08 ENCOUNTER — Encounter: Payer: BLUE CROSS/BLUE SHIELD | Admitting: Occupational Therapy

## 2018-06-09 ENCOUNTER — Ambulatory Visit: Payer: BLUE CROSS/BLUE SHIELD | Admitting: Neurology

## 2018-06-10 ENCOUNTER — Encounter: Payer: BLUE CROSS/BLUE SHIELD | Admitting: Occupational Therapy

## 2018-06-15 ENCOUNTER — Encounter: Payer: BLUE CROSS/BLUE SHIELD | Admitting: Occupational Therapy

## 2018-06-16 ENCOUNTER — Ambulatory Visit: Payer: BLUE CROSS/BLUE SHIELD | Admitting: Neurology

## 2018-06-16 ENCOUNTER — Encounter: Payer: BLUE CROSS/BLUE SHIELD | Admitting: Adult Health

## 2018-06-17 ENCOUNTER — Encounter: Payer: BLUE CROSS/BLUE SHIELD | Admitting: Occupational Therapy
